# Patient Record
Sex: Male | Born: 1935 | Race: Black or African American | Hispanic: No | Smoking: Former smoker
Health system: Southern US, Community
[De-identification: ages and names within clinical notes are randomized; demographics above are authoritative.]

## PROBLEM LIST (undated history)

## (undated) DIAGNOSIS — R55 Syncope and collapse: Secondary | ICD-10-CM

## (undated) DIAGNOSIS — N400 Enlarged prostate without lower urinary tract symptoms: Secondary | ICD-10-CM

## (undated) DIAGNOSIS — M1990 Unspecified osteoarthritis, unspecified site: Secondary | ICD-10-CM

## (undated) DIAGNOSIS — M549 Dorsalgia, unspecified: Secondary | ICD-10-CM

## (undated) DIAGNOSIS — E119 Type 2 diabetes mellitus without complications: Secondary | ICD-10-CM

## (undated) DIAGNOSIS — G40909 Epilepsy, unspecified, not intractable, without status epilepticus: Secondary | ICD-10-CM

## (undated) DIAGNOSIS — K219 Gastro-esophageal reflux disease without esophagitis: Secondary | ICD-10-CM

## (undated) DIAGNOSIS — H409 Unspecified glaucoma: Secondary | ICD-10-CM

## (undated) DIAGNOSIS — F419 Anxiety disorder, unspecified: Secondary | ICD-10-CM

## (undated) DIAGNOSIS — I10 Essential (primary) hypertension: Secondary | ICD-10-CM

## (undated) DIAGNOSIS — E785 Hyperlipidemia, unspecified: Secondary | ICD-10-CM

## (undated) DIAGNOSIS — B029 Zoster without complications: Secondary | ICD-10-CM

## (undated) DIAGNOSIS — F609 Personality disorder, unspecified: Secondary | ICD-10-CM

## (undated) DIAGNOSIS — S2239XA Fracture of one rib, unspecified side, initial encounter for closed fracture: Secondary | ICD-10-CM

## (undated) DIAGNOSIS — H269 Unspecified cataract: Secondary | ICD-10-CM

## (undated) DIAGNOSIS — Z794 Long term (current) use of insulin: Principal | ICD-10-CM

## (undated) HISTORY — DX: Type 2 diabetes mellitus without complications: E11.9

## (undated) HISTORY — DX: Unspecified cataract: H26.9

## (undated) HISTORY — DX: Zoster without complications: B02.9

## (undated) HISTORY — DX: Essential (primary) hypertension: I10

## (undated) HISTORY — DX: Unspecified glaucoma: H40.9

## (undated) HISTORY — DX: Syncope and collapse: R55

## (undated) HISTORY — DX: Long term (current) use of insulin: Z79.4

## (undated) HISTORY — DX: Gastro-esophageal reflux disease without esophagitis: K21.9

## (undated) HISTORY — PX: EYE SURGERY: SHX253

---

## 2004-05-16 ENCOUNTER — Inpatient Hospital Stay (HOSPITAL_BASED_OUTPATIENT_CLINIC_OR_DEPARTMENT_OTHER): Admission: RE | Admit: 2004-05-16 | Discharge: 2004-05-16 | Payer: Self-pay | Admitting: Cardiovascular Disease

## 2010-12-30 HISTORY — PX: OTHER SURGICAL HISTORY: SHX169

## 2012-01-14 ENCOUNTER — Ambulatory Visit (INDEPENDENT_AMBULATORY_CARE_PROVIDER_SITE_OTHER): Payer: No Typology Code available for payment source | Admitting: Urology

## 2012-01-14 DIAGNOSIS — M545 Low back pain: Secondary | ICD-10-CM

## 2012-02-26 ENCOUNTER — Encounter (HOSPITAL_COMMUNITY): Payer: Self-pay

## 2012-02-26 ENCOUNTER — Observation Stay (HOSPITAL_COMMUNITY)
Admission: EM | Admit: 2012-02-26 | Discharge: 2012-02-28 | Disposition: A | Discharge status: Home / Self Care | Payer: No Typology Code available for payment source | Attending: Internal Medicine | Admitting: Internal Medicine

## 2012-02-26 ENCOUNTER — Emergency Department (HOSPITAL_COMMUNITY): Payer: No Typology Code available for payment source

## 2012-02-26 ENCOUNTER — Other Ambulatory Visit: Payer: Self-pay

## 2012-02-26 DIAGNOSIS — E119 Type 2 diabetes mellitus without complications: Secondary | ICD-10-CM

## 2012-02-26 DIAGNOSIS — I10 Essential (primary) hypertension: Secondary | ICD-10-CM | POA: Insufficient documentation

## 2012-02-26 DIAGNOSIS — R55 Syncope and collapse: Principal | ICD-10-CM | POA: Insufficient documentation

## 2012-02-26 DIAGNOSIS — R5381 Other malaise: Secondary | ICD-10-CM | POA: Insufficient documentation

## 2012-02-26 DIAGNOSIS — Z794 Long term (current) use of insulin: Secondary | ICD-10-CM | POA: Insufficient documentation

## 2012-02-26 DIAGNOSIS — E1159 Type 2 diabetes mellitus with other circulatory complications: Secondary | ICD-10-CM

## 2012-02-26 DIAGNOSIS — Z7982 Long term (current) use of aspirin: Secondary | ICD-10-CM | POA: Insufficient documentation

## 2012-02-26 DIAGNOSIS — G8929 Other chronic pain: Secondary | ICD-10-CM | POA: Insufficient documentation

## 2012-02-26 DIAGNOSIS — M549 Dorsalgia, unspecified: Secondary | ICD-10-CM | POA: Insufficient documentation

## 2012-02-26 DIAGNOSIS — Z79899 Other long term (current) drug therapy: Secondary | ICD-10-CM | POA: Insufficient documentation

## 2012-02-26 DIAGNOSIS — E785 Hyperlipidemia, unspecified: Secondary | ICD-10-CM | POA: Insufficient documentation

## 2012-02-26 DIAGNOSIS — F411 Generalized anxiety disorder: Secondary | ICD-10-CM | POA: Insufficient documentation

## 2012-02-26 DIAGNOSIS — I152 Hypertension secondary to endocrine disorders: Secondary | ICD-10-CM

## 2012-02-26 DIAGNOSIS — E1169 Type 2 diabetes mellitus with other specified complication: Secondary | ICD-10-CM

## 2012-02-26 HISTORY — DX: Hyperlipidemia, unspecified: E78.5

## 2012-02-26 HISTORY — DX: Personality disorder, unspecified: F60.9

## 2012-02-26 HISTORY — DX: Benign prostatic hyperplasia without lower urinary tract symptoms: N40.0

## 2012-02-26 HISTORY — DX: Type 2 diabetes mellitus without complications: Z79.4

## 2012-02-26 HISTORY — DX: Dorsalgia, unspecified: M54.9

## 2012-02-26 HISTORY — DX: Type 2 diabetes mellitus without complications: E11.9

## 2012-02-26 LAB — POCT I-STAT, CHEM 8
BUN: 9 mg/dL (ref 6–23)
Calcium, Ion: 1.16 mmol/L (ref 1.12–1.32)
Chloride: 100 mEq/L (ref 96–112)
Creatinine, Ser: 0.9 mg/dL (ref 0.50–1.35)
Glucose, Bld: 199 mg/dL — ABNORMAL HIGH (ref 70–99)
TCO2: 26 mmol/L (ref 0–100)

## 2012-02-26 LAB — URINALYSIS, ROUTINE W REFLEX MICROSCOPIC
Hgb urine dipstick: NEGATIVE
Leukocytes, UA: NEGATIVE
Nitrite: NEGATIVE
Protein, ur: NEGATIVE mg/dL
Specific Gravity, Urine: 1.018 (ref 1.005–1.030)
Urobilinogen, UA: 1 mg/dL (ref 0.0–1.0)

## 2012-02-26 LAB — GLUCOSE, CAPILLARY

## 2012-02-26 LAB — CARBOXYHEMOGLOBIN: Total hemoglobin: 14 g/dL (ref 13.5–18.0)

## 2012-02-26 MED ORDER — HYDROCHLOROTHIAZIDE 12.5 MG PO CAPS
12.5000 mg | ORAL_CAPSULE | Freq: Every day | ORAL | Status: DC
  Administered 2012-02-27 – 2012-02-28 (×2): 12.5 mg via ORAL
  Filled 2012-02-26 (×2): qty 1

## 2012-02-26 MED ORDER — INSULIN ASPART 100 UNIT/ML ~~LOC~~ SOLN
0.0000 [IU] | Freq: Three times a day (TID) | SUBCUTANEOUS | Status: DC
  Administered 2012-02-27 (×2): 5 [IU] via SUBCUTANEOUS
  Filled 2012-02-26: qty 0.15

## 2012-02-26 MED ORDER — SIMVASTATIN 20 MG PO TABS
20.0000 mg | ORAL_TABLET | Freq: Every day | ORAL | Status: DC
  Administered 2012-02-27: 20 mg via ORAL
  Filled 2012-02-26 (×2): qty 1

## 2012-02-26 MED ORDER — INSULIN ASPART 100 UNIT/ML ~~LOC~~ SOLN: 0.0000 [IU] | Freq: Every day | SUBCUTANEOUS | Status: DC

## 2012-02-26 MED ORDER — SODIUM CHLORIDE 0.9 % IJ SOLN: 3.0000 mL | INTRAMUSCULAR | Status: DC | PRN

## 2012-02-26 MED ORDER — INSULIN DETEMIR 100 UNIT/ML ~~LOC~~ SOLN
45.0000 [IU] | Freq: Every day | SUBCUTANEOUS | Status: DC
  Administered 2012-02-26 – 2012-02-27 (×2): 45 [IU] via SUBCUTANEOUS
  Filled 2012-02-26: qty 3

## 2012-02-26 MED ORDER — TERAZOSIN HCL 5 MG PO CAPS
5.0000 mg | ORAL_CAPSULE | Freq: Every day | ORAL | Status: DC
  Administered 2012-02-27 – 2012-02-28 (×2): 5 mg via ORAL
  Filled 2012-02-26 (×2): qty 1

## 2012-02-26 MED ORDER — CITALOPRAM HYDROBROMIDE 40 MG PO TABS
40.0000 mg | ORAL_TABLET | Freq: Every day | ORAL | Status: DC
  Administered 2012-02-27 – 2012-02-28 (×2): 40 mg via ORAL
  Filled 2012-02-26 (×2): qty 1

## 2012-02-26 MED ORDER — ENOXAPARIN SODIUM 40 MG/0.4ML ~~LOC~~ SOLN
40.0000 mg | SUBCUTANEOUS | Status: DC
  Administered 2012-02-27 – 2012-02-28 (×2): 40 mg via SUBCUTANEOUS
  Filled 2012-02-26 (×2): qty 0.4

## 2012-02-26 MED ORDER — RAMIPRIL 10 MG PO CAPS
10.0000 mg | ORAL_CAPSULE | Freq: Every day | ORAL | Status: DC
  Administered 2012-02-27 – 2012-02-28 (×2): 10 mg via ORAL
  Filled 2012-02-26 (×2): qty 1

## 2012-02-26 MED ORDER — NAPROXEN 500 MG PO TABS
500.0000 mg | ORAL_TABLET | Freq: Two times a day (BID) | ORAL | Status: DC
  Administered 2012-02-27 – 2012-02-28 (×2): 500 mg via ORAL
  Filled 2012-02-26 (×4): qty 1

## 2012-02-26 MED ORDER — AMLODIPINE BESYLATE 5 MG PO TABS
5.0000 mg | ORAL_TABLET | Freq: Every day | ORAL | Status: DC
  Administered 2012-02-27 – 2012-02-28 (×2): 5 mg via ORAL
  Filled 2012-02-26 (×2): qty 1

## 2012-02-26 MED ORDER — ASPIRIN EC 325 MG PO TBEC
325.0000 mg | DELAYED_RELEASE_TABLET | Freq: Every day | ORAL | Status: DC
  Administered 2012-02-27 – 2012-02-28 (×2): 325 mg via ORAL
  Filled 2012-02-26 (×2): qty 1

## 2012-02-26 MED ORDER — DICLOFENAC SODIUM 75 MG PO TBEC
75.0000 mg | DELAYED_RELEASE_TABLET | Freq: Two times a day (BID) | ORAL | Status: DC | PRN
  Filled 2012-02-26: qty 1

## 2012-02-26 MED ORDER — DIAZEPAM 5 MG PO TABS: 5.0000 mg | ORAL_TABLET | Freq: Two times a day (BID) | ORAL | Status: DC | PRN

## 2012-02-26 NOTE — ED Notes (Signed)
HYQ:[id number]<IO> Expected date:02/26/12<BR> Expected time: 5:33 PM<BR> Means of arrival:Ambulance<BR> Comments:<BR> EMS 6 RK - syncope
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
## 2012-02-27 DIAGNOSIS — R55 Syncope and collapse: Secondary | ICD-10-CM

## 2012-02-27 LAB — CBC
MCH: 30.2 pg (ref 26.0–34.0)
MCHC: 35.6 g/dL (ref 30.0–36.0)
Platelets: 228 10*3/uL (ref 150–400)
RBC: 4.37 MIL/uL (ref 4.22–5.81)

## 2012-02-27 LAB — LIPID PANEL
Cholesterol: 125 mg/dL (ref 0–200)
Total CHOL/HDL Ratio: 4.5 RATIO
Triglycerides: 166 mg/dL — ABNORMAL HIGH (ref <150)

## 2012-02-27 LAB — CARDIAC PANEL(CRET KIN+CKTOT+MB+TROPI)
CK, MB: 6.8 ng/mL (ref 0.3–4.0)
Troponin I: <0.3 ng/mL (ref <0.30)

## 2012-02-27 LAB — GLUCOSE, CAPILLARY
Glucose-Capillary: 216 mg/dL — ABNORMAL HIGH (ref 70–99)
Glucose-Capillary: 260 mg/dL — ABNORMAL HIGH (ref 70–99)

## 2012-02-27 LAB — BASIC METABOLIC PANEL
Calcium: 9.6 mg/dL (ref 8.4–10.5)
GFR calc Af Amer: >90 mL/min (ref >90)
GFR calc non Af Amer: 86 mL/min — ABNORMAL LOW (ref >90)
Glucose, Bld: 275 mg/dL — ABNORMAL HIGH (ref 70–99)
Sodium: 133 mEq/L — ABNORMAL LOW (ref 135–145)

## 2012-02-27 MED ORDER — INSULIN ASPART 100 UNIT/ML ~~LOC~~ SOLN
0.0000 [IU] | Freq: Three times a day (TID) | SUBCUTANEOUS | Status: DC
  Administered 2012-02-27 – 2012-02-28 (×2): 5 [IU] via SUBCUTANEOUS
  Administered 2012-02-28: 3 [IU] via SUBCUTANEOUS
  Filled 2012-02-27: qty 3

## 2012-02-27 NOTE — Progress Notes (Signed)
02/27/12 1715 Lab notified RN that the CK,MB was 6.8. MD was notified. Awaiting any new orders

## 2012-02-27 NOTE — ED Notes (Signed)
Not quite sure that patient understand the reason that he is being admitted.  Thisnis tha third time that hea has asked if he was going home today.  Told that they need to find out why he is passing out. And He sts  "oh yeah"

## 2012-02-27 NOTE — Progress Notes (Signed)
VASCULAR LAB PRELIMINARY  PRELIMINARY  PRELIMINARY  PRELIMINARY  Carotid duplex completed.    Preliminary report:  Bilateral:  No evidence of hemodynamically significant internal carotid artery stenosis.   Vertebral artery flow is antegrade.     Iren Whipp D, RVS 02/27/2012, 8:58 AM

## 2012-02-27 NOTE — ED Notes (Signed)
CBG 233 

## 2012-02-27 NOTE — Progress Notes (Signed)
Subjective: Patient feeling well. He denies chest pain, palpitation prior to syncope event. He has been taking flexeril since 3 months ago and valium for more than 8 months.  Objective: Filed Vitals:   02/26/12 2020 02/27/12 0103 02/27/12 0619 02/27/12 1401  BP: 166/77 156/73 154/73 146/80  Pulse: 83 79 81 87  Temp: 98.7 F (37.1 C) 98.4 F (36.9 C) 98.3 F (36.8 C) 97.5 F (36.4 C)  TempSrc: Oral Oral  Oral  Resp: 16 16 16    SpO2: 98% 95% 96% 96%   Weight change:  No intake or output data in the 24 hours ending 02/27/12 1433  General: Alert, awake, oriented x3, in no acute distress.  HEENT: No bruits, no goiter.  Heart: Regular rate and rhythm, without murmurs, rubs, gallops.  Lungs: CTA, bilateral air movement.  Abdomen: Soft, nontender, nondistended, positive bowel sounds.  Neuro: Grossly intact, nonfocal. Extremities; no edema.   Lab Results:  [facility] 02/27/12 0455 02/26/12 1942  NA 133* 138  K 3.8 3.7  CL 98 100  CO2 26 --  GLUCOSE 275* 199*  BUN 10 9  CREATININE 0.77 0.90  CALCIUM 9.6 --  MG -- --  PHOS -- --   Basename 02/27/12 0455 02/26/12 1942  WBC 6.7 --  NEUTROABS -- --  HGB 13.2 14.3  HCT 37.1* 42.0  MCV 84.9 --  PLT 228 --    Basename 02/27/12 0455  CHOL 125  HDL 28*  LDLCALC 64  TRIG 960*  CHOLHDL 4.5  LDLDIRECT --   Studies/Results: Dg Chest 2 View  02/26/2012  *RADIOLOGY REPORT*  Clinical Data: Weakness and syncope.  CHEST - 2 VIEW  Comparison: None.  Findings: Bibasilar atelectasis present, right greater than left. The heart is mildly enlarged.  No edema, pulmonary consolidation or pleural fluid identified.  The spine shows mild degenerative disease.  IMPRESSION: Bibasilar atelectasis, right greater than left.  Original Report Authenticated By: Reola Calkins, M.D.   Ct Head Wo Contrast  02/26/2012  *RADIOLOGY REPORT*  Clinical Data: 78 year old male with syncope.  CT HEAD WITHOUT CONTRAST  Technique:  Contiguous axial images were  obtained from the base of the skull through the vertex without contrast.  Comparison: None.  Findings: Visualized paranasal sinuses and mastoids are clear. Suggestion of left posterior convexity scalp hematoma.  Underlying calvarium intact.  No other acute scalp or orbit soft tissue findings.  Cerebral volume is within normal limits for age.  No midline shift, ventriculomegaly, mass effect, evidence of mass lesion, intracranial hemorrhage or evidence of cortically based acute infarction.  Gray-white matter differentiation is within normal limits throughout the brain.  No suspicious intracranial vascular hyperdensity.  IMPRESSION: Normal noncontrast CT appearance of the brain for age.  Left vertex hematoma without underlying fracture.  Original Report Authenticated By: Harley Hallmark, M.D.    Medications: I have reviewed the patient's current medications.   Patient Active Hospital Problem List:  Syncope and collapse (02/26/2012) Unclear etiology. Monitor for arrythmia on telemetry. I will check ECHO, cardiac enzymes.  CT head negative. Maybe component of polypharmacy. I advised patient to stop taking flexeril.   Diabetes mellitus (02/26/2012) Levemir, SSI.   Hypertension (02/26/2012) Continue with Norvasc, ramipril.   Hyperlipidemia (02/26/2012) Continue with simvastatin.    Chronic pain; Naproxen. Stop flexeril. Anxiety: Continue with valium PRN.    LOS: 1 day   Teagan Ozawa M.D.  [facility] 02/27/2012, 2:33 PM

## 2012-02-27 NOTE — Progress Notes (Signed)
  Echocardiogram 2D Echocardiogram has been performed.  Cathie Beams Deneen 02/27/2012, 4:18 PM

## 2012-02-28 DIAGNOSIS — S2249XA Multiple fractures of ribs, unspecified side, initial encounter for closed fracture: Secondary | ICD-10-CM

## 2012-02-28 HISTORY — DX: Multiple fractures of ribs, unspecified side, initial encounter for closed fracture: S22.49XA

## 2012-02-28 LAB — CARDIAC PANEL(CRET KIN+CKTOT+MB+TROPI): Total CK: 789 U/L — ABNORMAL HIGH (ref 7–232)

## 2012-02-28 LAB — GLUCOSE, CAPILLARY
Glucose-Capillary: 207 mg/dL — ABNORMAL HIGH (ref 70–99)
Glucose-Capillary: 281 mg/dL — ABNORMAL HIGH (ref 70–99)

## 2012-02-28 MED ORDER — CITALOPRAM HYDROBROMIDE 40 MG PO TABS: 20.0000 mg | ORAL_TABLET | Freq: Every day | ORAL | Status: DC
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
## 2012-02-28 NOTE — Progress Notes (Signed)
02/28/12 patient was given discharge education. PIV was removed. Patient is being transported to his home by a friend's car. Patient had his valuables and medications returned to him. Patient is eager to go home.
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
## 2012-02-28 NOTE — Progress Notes (Signed)
OT Note Order received, chart reviewed. Pt reported that he is independent with all aDLs and mobility. Pt presents with no OT needs at this time. Will sign off.  Garrel Ridgel, OTR/L  Pager [phone] 02/28/2012

## 2012-02-28 NOTE — Progress Notes (Signed)
Talked to patient about DCP; patient goes to a [facility] / Dr Christell Constant; lives alone, continues to be active in the community - very, very talkative. Prescriptions are filled at [facility] in [place]. Patient without any complaints at this time. Abelino Derrick RN, BSN, [employer]

## 2012-02-28 NOTE — Discharge Instructions (Addendum)
Blood Sugar Monitoring, Adult GLUCOSE METERS FOR SELF-MONITORING OF BLOOD GLUCOSE  It is important to be able to correctly measure your blood sugar (glucose). You can use a blood glucose monitor (a small battery-operated device) to check your glucose level at any time. This allows you and your caregiver to monitor your diabetes and to determine how well your treatment plan is working. The process of monitoring your blood glucose with a glucose meter is called self-monitoring of blood glucose (SMBG). When people with diabetes control their blood sugar, they have better health. To test for glucose with a typical glucose meter, place the disposable strip in the meter. Then place a small sample of blood on the "test strip." The test strip is coated with chemicals that combine with glucose in blood. The meter measures how much glucose is present. The meter displays the glucose level as a number. Several new models can record and store a number of test results. Some models can connect to personal computers to store test results or print them out.  Newer meters are often easier to use than older models. Some meters allow you to get blood from places other than your fingertip. Some new models have automatic timing, error codes, signals, or barcode readers to help with proper adjustment (calibration). Some meters have a large display screen or spoken instructions for people with visual impairments.  INSTRUCTIONS FOR USING GLUCOSE METERS  Wash your hands with soap and warm water, or clean the area with alcohol. Dry your hands completely.   Prick the side of your fingertip with a lancet (a sharp-pointed tool used by hand).   Hold the hand down and gently milk the finger until a small drop of blood appears. Catch the blood with the test strip.   Follow the instructions for inserting the test strip and using the SMBG meter. Most meters require the meter to be turned on and the test strip to be inserted before  applying the blood sample.   Record the test result.   Read the instructions carefully for both the meter and the test strips that go with it. Meter instructions are found in the user manual. Keep this manual to help you solve any problems that may arise. Many meters use "error codes" when there is a problem with the meter, the test strip, or the blood sample on the strip. You will need the manual to understand these error codes and fix the problem.   New devices are available such as laser lancets and meters that can test blood taken from "alternative sites" of the body, other than fingertips. However, you should use standard fingertip testing if your glucose changes rapidly. Also, use standard testing if:   You have eaten, exercised, or taken insulin in the past 2 hours.   You think your glucose is low.   You tend to not feel symptoms of low blood glucose (hypoglycemia).   You are ill or under stress.   Clean the meter as directed by the manufacturer.   Test the meter for accuracy as directed by the manufacturer.   Take your meter with you to your caregiver's office. This way, you can test your glucose in front of your caregiver to make sure you are using the meter correctly. Your caregiver can also take a sample of blood to test using a routine lab method. If values on the glucose meter are close to the lab results, you and your caregiver will see that your meter is working well  and you are using good technique. Your caregiver will advise you about what to do if the results do not match.  FREQUENCY OF TESTING  Your caregiver will tell you how often you should check your blood glucose. This will depend on your type of diabetes, your current level of diabetes control, and your types of medicines. The following are general guidelines, but your care plan may be different. Record all your readings and the time of day you took them for review with your caregiver.   Diabetes type 1.   When you  are using insulin with good diabetic control (either multiple daily injections or via a pump), you should check your glucose 4 times a day.   If your diabetes is not well controlled, you may need to monitor more frequently, including before meals and 2 hours after meals, at bedtime, and occasionally between 2 a.m. and 3 a.m.   You should always check your glucose before a dose of insulin or before changing the rate on your insulin pump.   Diabetes type 2.   Guidelines for SMBG in diabetes type 2 are not as well defined.   If you are on insulin, follow the guidelines above.   If you are on medicines, but not insulin, and your glucose is not well controlled, you should test at least twice daily.   If you are not on insulin, and your diabetes is controlled with medicines or diet alone, you should test at least once daily, usually before breakfast.   A weekly profile will help your caregiver advise you on your care plan. The week before your visit, check your glucose before a meal and 2 hours after a meal at least daily. You may want to test before and after a different meal each day so you and your caregiver can tell how well controlled your blood sugars are throughout the course of a 24 hour period.   Gestational diabetes (diabetes during pregnancy).   Frequent testing is often necessary. Accurate timing is important.   If you are not on insulin, check your glucose 4 times a day. Check it before breakfast and 1 hour after the start of each meal.   If you are on insulin, check your glucose 6 times a day. Check it before each meal and 1 hour after the first bite of each meal.   General guidelines.   More frequent testing is required at the start of insulin treatment. Your caregiver will instruct you.   Test your glucose any time you suspect you have low blood sugar (hypoglycemia).   You should test more often when you change medicines, when you have unusual stress or illness, or in other  unusual circumstances.  OTHER THINGS TO KNOW ABOUT GLUCOSE METERS  Measurement Range. Most glucose meters are able to read glucose levels over a broad range of values from as low as 0 to as high as 600 mg/dL. If you get an extremely high or low reading from your meter, you should first confirm it with another reading. Report very high or very low readings to your caregiver.   Whole Blood Glucose versus Plasma Glucose. Some older home glucose meters measure glucose in your whole blood. In a lab or when using some newer home glucose meters, the glucose is measured in your plasma (one component of blood). The difference can be important. It is important for you and your caregiver to know whether your meter gives its results as "whole blood equivalent" or "plasma  equivalent."   Display of High and Low Glucose Values. Part of learning how to operate a meter is understanding what the meter results mean. Know how high and low glucose concentrations are displayed on your meter.   Factors that Affect Glucose Meter Performance. The accuracy of your test results depends on many factors and varies depending on the brand and type of meter. These factors include:   Low red blood cell count (anemia).   Substances in your blood (such as uric acid, vitamin C, and others).   Environmental factors (temperature, humidity, altitude).   Name-brand versus generic test strips.   Calibration. Make sure your meter is set up properly. It is a good idea to do a calibration test with a control solution recommended by the manufacturer of your meter whenever you begin using a fresh bottle of test strips. This will help verify the accuracy of your meter.   Improperly stored, expired, or defective test strips. Keep your strips in a dry place with the lid on.   Soiled meter.   Inadequate blood sample.  NEW TECHNOLOGIES FOR GLUCOSE TESTING Alternative site testing Some glucose meters allow testing blood from alternative  sites. These include the:  Upper arm.   Forearm.   Base of the thumb.   Thigh.  Sampling blood from alternative sites may be desirable. However, it may have some limitations. Blood in the fingertips show changes in glucose levels more quickly than blood in other parts of the body. This means that alternative site test results may be different from fingertip test results, not because of the meter's ability to test accurately, but because the actual glucose concentration can be different.  Continuous Glucose Monitoring Devices to measure your blood glucose continuously are available, and others are in development. These methods can be more expensive than self-monitoring with a glucose meter. However, it is uncertain how effective and reliable these devices are. Your caregiver will advise you if this approach makes sense for you. IF BLOOD SUGARS ARE CONTROLLED, PEOPLE WITH DIABETES REMAIN HEALTHIER.  SMBG is an important part of the treatment plan of patients with diabetes mellitus. Below are reasons for using SMBG:   It confirms that your glucose is at a specific, healthy level.   It detects hypoglycemia and severe hyperglycemia.   It allows you and your caregiver to make adjustments in response to changes in lifestyle for individuals requiring medicine.   It determines the need for starting insulin therapy in temporary diabetes that happens during pregnancy (gestational diabetes).  Document Released: 12/19/2003 Document Revised: 08/29/2011 Document Reviewed: 04/11/2011 [facility] Patient Information 2012 [place], [place].Cholesterol Control Diet Cholesterol levels in your body are determined significantly by your diet. Cholesterol levels may also be related to heart disease. The following material helps to explain this relationship and discusses what you can do to help keep your heart healthy. Not all cholesterol is bad. Low-density lipoprotein (LDL) cholesterol is the "bad" cholesterol. It may  cause fatty deposits to build up inside your arteries. High-density lipoprotein (HDL) cholesterol is "good." It helps to remove the "bad" LDL cholesterol from your blood. Cholesterol is a very important risk factor for heart disease. Other risk factors are high blood pressure, smoking, stress, heredity, and weight. The heart muscle gets its supply of blood through the coronary arteries. If your LDL cholesterol is high and your HDL cholesterol is low, you are at risk for having fatty deposits build up in your coronary arteries. This leaves less room through which blood can  flow. Without sufficient blood and oxygen, the heart muscle cannot function properly and you may feel chest pains (angina pectoris). When a coronary artery closes up entirely, a part of the heart muscle may die, causing a heart attack (myocardial infarction). CHECKING CHOLESTEROL When your caregiver sends your blood to a lab to be analyzed for cholesterol, a complete lipid (fat) profile may be done. With this test, the total amount of cholesterol and levels of LDL and HDL are determined. Triglycerides are a type of fat that circulates in the blood and can also be used to determine heart disease risk. The list below describes what the numbers should be: Test: Total Cholesterol.  Less than 200 mg/dl.  Test: LDL "bad cholesterol."  Less than 100 mg/dl.   Less than 70 mg/dl if you are at very high risk of a heart attack or sudden cardiac death.  Test: HDL "good cholesterol."  Greater than 50 mg/dl for women.   Greater than 40 mg/dl for men.  Test: Triglycerides.  Less than 150 mg/dl.  CONTROLLING CHOLESTEROL WITH DIET Although exercise and lifestyle factors are important, your diet is key. That is because certain foods are known to raise cholesterol and others to lower it. The goal is to balance foods for their effect on cholesterol and more importantly, to replace saturated and trans fat with other types of fat, such as  monounsaturated fat, polyunsaturated fat, and omega-3 fatty acids. On average, a person should consume no more than 15 to 17 g of saturated fat daily. Saturated and trans fats are considered "bad" fats, and they will raise LDL cholesterol. Saturated fats are primarily found in animal products such as meats, butter, and cream. However, that does not mean you need to sacrifice all your favorite foods. Today, there are good tasting, low-fat, low-cholesterol substitutes for most of the things you like to eat. Choose low-fat or nonfat alternatives. Choose round or loin cuts of red meat, since these types of cuts are lowest in fat and cholesterol. Chicken (without the skin), fish, veal, and ground [place] breast are excellent choices. Eliminate fatty meats, such as hot dogs and salami. Even shellfish have little or no saturated fat. Have a 3 oz (85 g) portion when you eat lean meat, poultry, or fish. Trans fats are also called "partially hydrogenated oils." They are oils that have been scientifically manipulated so that they are solid at room temperature resulting in a longer shelf life and improved taste and texture of foods in which they are added. Trans fats are found in stick margarine, some tub margarines, cookies, crackers, and baked goods.  When baking and cooking, oils are an excellent substitute for butter. The monounsaturated oils are especially beneficial since it is believed they lower LDL and raise HDL. The oils you should avoid entirely are saturated tropical oils, such as coconut and palm.  Remember to eat liberally from food groups that are naturally free of saturated and trans fat, including fish, fruit, vegetables, beans, grains (barley, rice, couscous, bulgur wheat), and pasta (without cream sauces).  IDENTIFYING FOODS THAT LOWER CHOLESTEROL  Soluble fiber may lower your cholesterol. This type of fiber is found in fruits such as apples, vegetables such as broccoli, potatoes, and carrots, legumes  such as beans, peas, and lentils, and grains such as barley. Foods fortified with plant sterols (phytosterol) may also lower cholesterol. You should eat at least 2 g per day of these foods for a cholesterol lowering effect.  Read package labels to identify low-saturated  fats, trans fats free, and low-fat foods at the supermarket. Select cheeses that have only 2 to 3 g saturated fat per ounce. Use a heart-healthy tub margarine that is free of trans fats or partially hydrogenated oil. When buying baked goods (cookies, crackers), avoid partially hydrogenated oils. Breads and muffins should be made from whole grains (whole-wheat or whole oat flour, instead of "flour" or "enriched flour"). Buy non-creamy canned soups with reduced salt and no added fats.  FOOD PREPARATION TECHNIQUES  Never deep-fry. If you must fry, either stir-fry, which uses very little fat, or use non-stick cooking sprays. When possible, broil, bake, or roast meats, and steam vegetables. Instead of dressing vegetables with butter or margarine, use lemon and herbs, applesauce and cinnamon (for squash and sweet potatoes), nonfat yogurt, salsa, and low-fat dressings for salads.  LOW-SATURATED FAT / LOW-FAT FOOD SUBSTITUTES Meats / Saturated Fat (g)  Avoid: Steak, marbled (3 oz/85 g) / 11 g   Choose: Steak, lean (3 oz/85 g) / 4 g   Avoid: Hamburger (3 oz/85 g) / 7 g   Choose: Hamburger, lean (3 oz/85 g) / 5 g   Avoid: Ham (3 oz/85 g) / 6 g   Choose: Ham, lean cut (3 oz/85 g) / 2.4 g   Avoid: Chicken, with skin, dark meat (3 oz/85 g) / 4 g   Choose: Chicken, skin removed, dark meat (3 oz/85 g) / 2 g   Avoid: Chicken, with skin, light meat (3 oz/85 g) / 2.5 g   Choose: Chicken, skin removed, light meat (3 oz/85 g) / 1 g  Dairy / Saturated Fat (g)  Avoid: Whole milk (1 cup) / 5 g   Choose: Low-fat milk, 2% (1 cup) / 3 g   Choose: Low-fat milk, 1% (1 cup) / 1.5 g   Choose: Skim milk (1 cup) / 0.3 g   Avoid: Hard cheese (1  oz/28 g) / 6 g   Choose: Skim milk cheese (1 oz/28 g) / 2 to 3 g   Avoid: Cottage cheese, 4% fat (1 cup) / 6.5 g   Choose: Low-fat cottage cheese, 1% fat (1 cup) / 1.5 g   Avoid: Ice cream (1 cup) / 9 g   Choose: Sherbet (1 cup) / 2.5 g   Choose: Nonfat frozen yogurt (1 cup) / 0.3 g   Choose: Frozen fruit bar / trace   Avoid: Whipped cream (1 tbs) / 3.5 g   Choose: Nondairy whipped topping (1 tbs) / 1 g  Condiments / Saturated Fat (g)  Avoid: Mayonnaise (1 tbs) / 2 g   Choose: Low-fat mayonnaise (1 tbs) / 1 g   Avoid: Butter (1 tbs) / 7 g   Choose: Extra light margarine (1 tbs) / 1 g   Avoid: Coconut oil (1 tbs) / 11.8 g   Choose: Olive oil (1 tbs) / 1.8 g   Choose: Corn oil (1 tbs) / 1.7 g   Choose: Safflower oil (1 tbs) / 1.2 g   Choose: Sunflower oil (1 tbs) / 1.4 g   Choose: Soybean oil (1 tbs) / 2.4 g   Choose: Canola oil (1 tbs) / 1 g  Document Released: 12/16/2005 Document Revised: 08/28/2011 Document Reviewed: 06/06/2011 [facility] Patient Information 2012 [place], [place].Arterial Hypertension Arterial hypertension (high blood pressure) is a condition of elevated pressure in your blood vessels. Hypertension over a long period of time is a risk factor for strokes, heart attacks, and heart failure. It is also the leading cause of  kidney (renal) failure.  CAUSES   In Adults -- Over 90% of all hypertension has no known cause. This is called essential or primary hypertension. In the other 10% of people with hypertension, the increase in blood pressure is caused by another disorder. This is called secondary hypertension. Important causes of secondary hypertension are:   Heavy alcohol use.   Obstructive sleep apnea.   Hyperaldosterosim (Conn's syndrome).   Steroid use.   Chronic kidney failure.   Hyperparathyroidism.   Medications.   Renal artery stenosis.   Pheochromocytoma.   Cushing's disease.   Coarctation of the aorta.   Scleroderma renal  crisis.   Licorice (in excessive amounts).   Drugs (cocaine, methamphetamine).  Your caregiver can explain any items above that apply to you.  In Children -- Secondary hypertension is more common and should always be considered.   Pregnancy -- Few women of childbearing age have high blood pressure. However, up to 10% of them develop hypertension of pregnancy. Generally, this will not harm the woman. It may be a sign of 3 complications of pregnancy: preeclampsia, HELLP syndrome, and eclampsia. Follow up and control with medication is necessary.  SYMPTOMS   This condition normally does not produce any noticeable symptoms. It is usually found during a routine exam.   Malignant hypertension is a late problem of high blood pressure. It may have the following symptoms:   Headaches.   Blurred vision.   End-organ damage (this means your kidneys, heart, lungs, and other organs are being damaged).   Stressful situations can increase the blood pressure. If a person with normal blood pressure has their blood pressure go up while being seen by their caregiver, this is often termed "white coat hypertension." Its importance is not known. It may be related with eventually developing hypertension or complications of hypertension.   Hypertension is often confused with mental tension, stress, and anxiety.  DIAGNOSIS  The diagnosis is made by 3 separate blood pressure measurements. They are taken at least 1 week apart from each other. If there is organ damage from hypertension, the diagnosis may be made without repeat measurements. Hypertension is usually identified by having blood pressure readings:  Above 140/90 mmHg measured in both arms, at 3 separate times, over a couple weeks.   Over 130/80 mmHg should be considered a risk factor and may require treatment in patients with diabetes.  Blood pressure readings over 120/80 mmHg are called "pre-hypertension" even in non-diabetic patients. To get a true  blood pressure measurement, use the following guidelines. Be aware of the factors that can alter blood pressure readings.  Take measurements at least 1 hour after caffeine.   Take measurements 30 minutes after smoking and without any stress. This is another reason to quit smoking - it raises your blood pressure.   Use a proper cuff size. Ask your caregiver if you are not sure about your cuff size.   Most home blood pressure cuffs are automatic. They will measure systolic and diastolic pressures. The systolic pressure is the pressure reading at the start of sounds. Diastolic pressure is the pressure at which the sounds disappear. If you are elderly, measure pressures in multiple postures. Try sitting, lying or standing.   Sit at rest for a minimum of 5 minutes before taking measurements.   You should not be on any medications like decongestants. These are found in many cold medications.   Record your blood pressure readings and review them with your caregiver.  If you have hypertension:  Your caregiver may do tests to be sure you do not have secondary hypertension (see "causes" above).   Your caregiver may also look for signs of metabolic syndrome. This is also called Syndrome X or Insulin Resistance Syndrome. You may have this syndrome if you have type 2 diabetes, abdominal obesity, and abnormal blood lipids in addition to hypertension.   Your caregiver will take your medical and family history and perform a physical exam.   Diagnostic tests may include blood tests (for glucose, cholesterol, potassium, and kidney function), a urinalysis, or an EKG. Other tests may also be necessary depending on your condition.  PREVENTION  There are important lifestyle issues that you can adopt to reduce your chance of developing hypertension:  Maintain a normal weight.   Limit the amount of salt (sodium) in your diet.   Exercise often.   Limit alcohol intake.   Get enough potassium in your diet.  Discuss specific advice with your caregiver.   Follow a DASH diet (dietary approaches to stop hypertension). This diet is rich in fruits, vegetables, and low-fat dairy products, and avoids certain fats.  PROGNOSIS  Essential hypertension cannot be cured. Lifestyle changes and medical treatment can lower blood pressure and reduce complications. The prognosis of secondary hypertension depends on the underlying cause. Many people whose hypertension is controlled with medicine or lifestyle changes can live a normal, healthy life.  RISKS AND COMPLICATIONS  While high blood pressure alone is not an illness, it often requires treatment due to its short- and long-term effects on many organs. Hypertension increases your risk for:  CVAs or strokes (cerebrovascular accident).   Heart failure due to chronically high blood pressure (hypertensive cardiomyopathy).   Heart attack (myocardial infarction).   Damage to the retina (hypertensive retinopathy).   Kidney failure (hypertensive nephropathy).  Your caregiver can explain list items above that apply to you. Treatment of hypertension can significantly reduce the risk of complications. TREATMENT   For overweight patients, weight loss and regular exercise are recommended. Physical fitness lowers blood pressure.   Mild hypertension is usually treated with diet and exercise. A diet rich in fruits and vegetables, fat-free dairy products, and foods low in fat and salt (sodium) can help lower blood pressure. Decreasing salt intake decreases blood pressure in a 1/3 of people.   Stop smoking if you are a smoker.  The steps above are highly effective in reducing blood pressure. While these actions are easy to suggest, they are difficult to achieve. Most patients with moderate or severe hypertension end up requiring medications to bring their blood pressure down to a normal level. There are several classes of medications for treatment. Blood pressure pills  (antihypertensives) will lower blood pressure by their different actions. Lowering the blood pressure by 10 mmHg may decrease the risk of complications by as much as 25%. The goal of treatment is effective blood pressure control. This will reduce your risk for complications. Your caregiver will help you determine the best treatment for you according to your lifestyle. What is excellent treatment for one person, may not be for you. HOME CARE INSTRUCTIONS   Do not smoke.   Follow the lifestyle changes outlined in the "Prevention" section.   If you are on medications, follow the directions carefully. Blood pressure medications must be taken as prescribed. Skipping doses reduces their benefit. It also puts you at risk for problems.   Follow up with your caregiver, as directed.   If you are asked to monitor your blood pressure  at home, follow the guidelines in the "Diagnosis" section above.  SEEK MEDICAL CARE IF:   You think you are having medication side effects.   You have recurrent headaches or lightheadedness.   You have swelling in your ankles.   You have trouble with your vision.  SEEK IMMEDIATE MEDICAL CARE IF:   You have sudden onset of chest pain or pressure, difficulty breathing, or other symptoms of a heart attack.   You have a severe headache.   You have symptoms of a stroke (such as sudden weakness, difficulty speaking, difficulty walking).  MAKE SURE YOU:   Understand these instructions.   Will watch your condition.   Will get help right away if you are not doing well or get worse.  Document Released: 12/16/2005 Document Revised: 08/28/2011 Document Reviewed: 07/16/2007 [facility] Patient Information 2012 [place], [place].

## 2012-03-06 ENCOUNTER — Ambulatory Visit: Payer: No Typology Code available for payment source | Admitting: Cardiology

## 2012-03-06 ENCOUNTER — Encounter: Payer: Self-pay | Admitting: Cardiology

## 2012-03-09 ENCOUNTER — Ambulatory Visit (INDEPENDENT_AMBULATORY_CARE_PROVIDER_SITE_OTHER): Payer: No Typology Code available for payment source | Admitting: Cardiology

## 2012-03-09 ENCOUNTER — Encounter: Payer: Self-pay | Admitting: Cardiology

## 2012-03-09 VITALS — BP 142/73 | HR 74 | Resp 18 | Ht 71.0 in | Wt 237.0 lb

## 2012-03-09 DIAGNOSIS — E785 Hyperlipidemia, unspecified: Secondary | ICD-10-CM

## 2012-03-09 DIAGNOSIS — R55 Syncope and collapse: Secondary | ICD-10-CM

## 2012-03-09 DIAGNOSIS — E119 Type 2 diabetes mellitus without complications: Secondary | ICD-10-CM

## 2012-03-09 DIAGNOSIS — I10 Essential (primary) hypertension: Secondary | ICD-10-CM

## 2012-03-09 MED ORDER — CITALOPRAM HYDROBROMIDE 10 MG PO TABS: 10.0000 mg | ORAL_TABLET | Freq: Every day | ORAL | Status: DC

## 2012-03-09 NOTE — Assessment & Plan Note (Signed)
Etiology not clear at this point. He has been told not to drive and this was reinforced today (technically for 6 months after syncopal event). LVEF normal by recent echocardiogram, no definite stroke. Not orthostatic by description. He does have a borderline to mildly prolonged QTc by ECG. I have asked him to reduce Celexa to 10 mg daily and will arrange an event recorder with office followup. No other medication changes for now.

## 2012-03-09 NOTE — Assessment & Plan Note (Signed)
Followed by Dr. Moore. 
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
## 2012-03-09 NOTE — Assessment & Plan Note (Signed)
Blood pressure mildly increased today. No change to antihypertensives.

## 2012-03-09 NOTE — Patient Instructions (Addendum)
**Note De-identified  Obfuscation**  **Note De-Identified  Obfuscation** Your physician has recommended you make the following change in your medication: Decrease Celexa to 10 mg daily   Your physician has recommended that you wear an event monitor. Event monitors are medical devices that record the heart's electrical activity. Doctors most often [place] these monitors to diagnose arrhythmias. Arrhythmias are problems with the speed or rhythm of the heartbeat. The monitor is a small, portable device. You can wear one while you do your normal daily activities. This is usually used to diagnose what is causing palpitations/syncope (passing out).  Your physician recommends that you schedule a follow-up appointment in: 3 weeks

## 2012-03-09 NOTE — Assessment & Plan Note (Signed)
On statin, followed by Dr. Christell Constant. Goal LDL under 100 with diabetes.

## 2012-03-22 ENCOUNTER — Telehealth: Payer: Self-pay | Admitting: Physician Assistant

## 2012-03-22 NOTE — Telephone Encounter (Signed)
Patient did call me back. He notes several episodes of syncope since seeing Dr. Nona Dell. I called [employer] back.  He is wearing a device that will trigger for VT, AFib, or bradyarrhythmias. No significant arrhythmias have been identified. Patient does describe significant cough assoc with syncope.  ? If cough induced. Reassured patient. Asked him to go to the ED if he had CP or dyspnea assoc with syncope. Tereso Newcomer, PA-C  4:14 PM 03/22/2012

## 2012-03-22 NOTE — Telephone Encounter (Signed)
Patient wearing monitor for syncope. Event recorded earlier today with reported symptoms of chest pain, lightheadedness, fatigue, dyspnea, faint, fell. Tele demonstrated NSR with HR 75 by report. [employer] tried to contact 3 times and left message.  They then contacted me.  They have been unable to reach him. I have asked them to notify the patient to call the answering service should he call back tonight (if he truly had a syncopal episode). Tereso Newcomer, PA-C  3:47 PM 03/22/2012

## 2012-03-23 ENCOUNTER — Emergency Department (HOSPITAL_COMMUNITY): Payer: No Typology Code available for payment source

## 2012-03-23 ENCOUNTER — Encounter (HOSPITAL_COMMUNITY): Payer: Self-pay | Admitting: *Deleted

## 2012-03-23 ENCOUNTER — Observation Stay (HOSPITAL_COMMUNITY)
Admission: EM | Admit: 2012-03-23 | Discharge: 2012-03-26 | Disposition: A | Discharge status: Home / Self Care | Payer: No Typology Code available for payment source | Attending: Internal Medicine | Admitting: Internal Medicine

## 2012-03-23 ENCOUNTER — Other Ambulatory Visit: Payer: Self-pay

## 2012-03-23 DIAGNOSIS — R7989 Other specified abnormal findings of blood chemistry: Secondary | ICD-10-CM | POA: Insufficient documentation

## 2012-03-23 DIAGNOSIS — G40909 Epilepsy, unspecified, not intractable, without status epilepticus: Secondary | ICD-10-CM | POA: Diagnosis present

## 2012-03-23 DIAGNOSIS — E1169 Type 2 diabetes mellitus with other specified complication: Secondary | ICD-10-CM | POA: Diagnosis present

## 2012-03-23 DIAGNOSIS — H401190 Primary open-angle glaucoma, unspecified eye, stage unspecified: Secondary | ICD-10-CM | POA: Diagnosis present

## 2012-03-23 DIAGNOSIS — S2249XA Multiple fractures of ribs, unspecified side, initial encounter for closed fracture: Secondary | ICD-10-CM | POA: Insufficient documentation

## 2012-03-23 DIAGNOSIS — S2239XA Fracture of one rib, unspecified side, initial encounter for closed fracture: Secondary | ICD-10-CM

## 2012-03-23 DIAGNOSIS — Z79899 Other long term (current) drug therapy: Secondary | ICD-10-CM | POA: Insufficient documentation

## 2012-03-23 DIAGNOSIS — R55 Syncope and collapse: Principal | ICD-10-CM | POA: Diagnosis present

## 2012-03-23 DIAGNOSIS — E785 Hyperlipidemia, unspecified: Secondary | ICD-10-CM | POA: Diagnosis present

## 2012-03-23 DIAGNOSIS — F419 Anxiety disorder, unspecified: Secondary | ICD-10-CM | POA: Diagnosis present

## 2012-03-23 DIAGNOSIS — R0602 Shortness of breath: Secondary | ICD-10-CM | POA: Insufficient documentation

## 2012-03-23 DIAGNOSIS — E119 Type 2 diabetes mellitus without complications: Secondary | ICD-10-CM | POA: Diagnosis present

## 2012-03-23 DIAGNOSIS — M899 Disorder of bone, unspecified: Secondary | ICD-10-CM

## 2012-03-23 DIAGNOSIS — E1159 Type 2 diabetes mellitus with other circulatory complications: Secondary | ICD-10-CM | POA: Diagnosis present

## 2012-03-23 DIAGNOSIS — G40802 Other epilepsy, not intractable, without status epilepticus: Secondary | ICD-10-CM | POA: Insufficient documentation

## 2012-03-23 DIAGNOSIS — Z794 Long term (current) use of insulin: Secondary | ICD-10-CM | POA: Insufficient documentation

## 2012-03-23 DIAGNOSIS — X58XXXA Exposure to other specified factors, initial encounter: Secondary | ICD-10-CM | POA: Insufficient documentation

## 2012-03-23 DIAGNOSIS — Z23 Encounter for immunization: Secondary | ICD-10-CM | POA: Insufficient documentation

## 2012-03-23 DIAGNOSIS — I10 Essential (primary) hypertension: Secondary | ICD-10-CM | POA: Insufficient documentation

## 2012-03-23 HISTORY — DX: Anxiety disorder, unspecified: F41.9

## 2012-03-23 HISTORY — DX: Epilepsy, unspecified, not intractable, without status epilepticus: G40.909

## 2012-03-23 HISTORY — DX: Fracture of one rib, unspecified side, initial encounter for closed fracture: S22.39XA

## 2012-03-23 LAB — DIFFERENTIAL
Eosinophils Absolute: 0.9 10*3/uL — ABNORMAL HIGH (ref 0.0–0.7)
Lymphocytes Relative: 24 % (ref 12–46)
Lymphs Abs: 2.2 10*3/uL (ref 0.7–4.0)
Neutro Abs: 5.3 10*3/uL (ref 1.7–7.7)
Neutrophils Relative %: 58 % (ref 43–77)

## 2012-03-23 LAB — GLUCOSE, CAPILLARY
Glucose-Capillary: 296 mg/dL — ABNORMAL HIGH (ref 70–99)
Glucose-Capillary: 298 mg/dL — ABNORMAL HIGH (ref 70–99)

## 2012-03-23 LAB — BASIC METABOLIC PANEL
BUN: 15 mg/dL (ref 6–23)
CO2: 25 mEq/L (ref 19–32)
Calcium: 9.6 mg/dL (ref 8.4–10.5)
Chloride: 94 mEq/L — ABNORMAL LOW (ref 96–112)
Creatinine, Ser: 0.73 mg/dL (ref 0.50–1.35)
GFR calc Af Amer: >90 mL/min (ref >90)
Potassium: 3.8 mEq/L (ref 3.5–5.1)
Sodium: 132 mEq/L — ABNORMAL LOW (ref 135–145)

## 2012-03-23 LAB — CBC
Platelets: 270 10*3/uL (ref 150–400)
RBC: 4.51 MIL/uL (ref 4.22–5.81)
WBC: 9.1 10*3/uL (ref 4.0–10.5)

## 2012-03-23 LAB — TROPONIN I: Troponin I: <0.3 ng/mL (ref <0.30)

## 2012-03-23 MED ORDER — LATANOPROST 0.005 % OP SOLN
1.0000 [drp] | Freq: Every day | OPHTHALMIC | Status: DC
  Administered 2012-03-24 – 2012-03-25 (×2): 1 [drp] via OPHTHALMIC
  Filled 2012-03-23: qty 2.5

## 2012-03-23 MED ORDER — PNEUMOCOCCAL VAC POLYVALENT 25 MCG/0.5ML IJ INJ
0.5000 mL | INJECTION | INTRAMUSCULAR | Status: AC
  Administered 2012-03-24: 0.5 mL via INTRAMUSCULAR
  Filled 2012-03-23: qty 0.5

## 2012-03-23 MED ORDER — POLYVINYL ALCOHOL 1.4 % OP SOLN: 2.0000 [drp] | Freq: Every day | OPHTHALMIC | Status: DC | PRN

## 2012-03-23 MED ORDER — RAMIPRIL 10 MG PO CAPS
10.0000 mg | ORAL_CAPSULE | Freq: Every day | ORAL | Status: DC
  Administered 2012-03-24 – 2012-03-26 (×3): 10 mg via ORAL
  Filled 2012-03-23 (×3): qty 1

## 2012-03-23 MED ORDER — INSULIN ASPART 100 UNIT/ML ~~LOC~~ SOLN: 20.0000 [IU] | SUBCUTANEOUS | Status: DC

## 2012-03-23 MED ORDER — INSULIN ASPART 100 UNIT/ML ~~LOC~~ SOLN
20.0000 [IU] | Freq: Every day | SUBCUTANEOUS | Status: DC
  Administered 2012-03-24 – 2012-03-26 (×3): 20 [IU] via SUBCUTANEOUS

## 2012-03-23 MED ORDER — SIMVASTATIN 20 MG PO TABS: 20.0000 mg | ORAL_TABLET | Freq: Every day | ORAL | Status: DC

## 2012-03-23 MED ORDER — SODIUM CHLORIDE 0.9 % IJ SOLN
3.0000 mL | Freq: Two times a day (BID) | INTRAMUSCULAR | Status: DC
  Administered 2012-03-23 – 2012-03-26 (×5): 3 mL via INTRAVENOUS
  Filled 2012-03-23 (×4): qty 3

## 2012-03-23 MED ORDER — TERAZOSIN HCL 5 MG PO CAPS
5.0000 mg | ORAL_CAPSULE | Freq: Every day | ORAL | Status: DC
  Filled 2012-03-23: qty 1

## 2012-03-23 MED ORDER — HEPARIN SODIUM (PORCINE) 5000 UNIT/ML IJ SOLN
5000.0000 [IU] | Freq: Three times a day (TID) | INTRAMUSCULAR | Status: DC
  Administered 2012-03-23 – 2012-03-26 (×8): 5000 [IU] via SUBCUTANEOUS
  Filled 2012-03-23 (×9): qty 1

## 2012-03-23 MED ORDER — INSULIN DETEMIR 100 UNIT/ML ~~LOC~~ SOLN
50.0000 [IU] | Freq: Every day | SUBCUTANEOUS | Status: DC
  Administered 2012-03-23 – 2012-03-24 (×2): 50 [IU] via SUBCUTANEOUS
  Filled 2012-03-23: qty 3

## 2012-03-23 MED ORDER — CITALOPRAM HYDROBROMIDE 20 MG PO TABS
10.0000 mg | ORAL_TABLET | Freq: Every day | ORAL | Status: DC
  Administered 2012-03-23 – 2012-03-26 (×4): 10 mg via ORAL
  Filled 2012-03-23 (×4): qty 1

## 2012-03-23 MED ORDER — HYDROCHLOROTHIAZIDE 12.5 MG PO CAPS
12.5000 mg | ORAL_CAPSULE | Freq: Every day | ORAL | Status: DC
  Administered 2012-03-24 – 2012-03-26 (×3): 12.5 mg via ORAL
  Filled 2012-03-23 (×3): qty 1

## 2012-03-23 MED ORDER — ACETAMINOPHEN 650 MG RE SUPP: 650.0000 mg | Freq: Four times a day (QID) | RECTAL | Status: DC | PRN

## 2012-03-23 MED ORDER — BUDESONIDE-FORMOTEROL FUMARATE 160-4.5 MCG/ACT IN AERO
INHALATION_SPRAY | RESPIRATORY_TRACT | Status: AC
  Filled 2012-03-23: qty 6

## 2012-03-23 MED ORDER — DIAZEPAM 2 MG PO TABS
2.0000 mg | ORAL_TABLET | Freq: Two times a day (BID) | ORAL | Status: DC | PRN
  Filled 2012-03-23: qty 1

## 2012-03-23 MED ORDER — BENZONATATE 100 MG PO CAPS
200.0000 mg | ORAL_CAPSULE | Freq: Three times a day (TID) | ORAL | Status: DC | PRN
  Administered 2012-03-25: 200 mg via ORAL
  Filled 2012-03-23: qty 2

## 2012-03-23 MED ORDER — ATORVASTATIN CALCIUM 20 MG PO TABS: 20.0000 mg | ORAL_TABLET | Freq: Every day | ORAL | Status: DC

## 2012-03-23 MED ORDER — VITAMIN D 1000 UNITS PO TABS
1000.0000 [IU] | ORAL_TABLET | Freq: Every day | ORAL | Status: DC
Start: 2012-03-23 — End: 2012-03-26
  Administered 2012-03-24 – 2012-03-26 (×3): 1000 [IU] via ORAL
  Filled 2012-03-23 (×3): qty 1

## 2012-03-23 MED ORDER — AMLODIPINE BESYLATE 5 MG PO TABS
5.0000 mg | ORAL_TABLET | Freq: Every day | ORAL | Status: DC
  Administered 2012-03-24 – 2012-03-26 (×3): 5 mg via ORAL
  Filled 2012-03-23 (×3): qty 1

## 2012-03-23 MED ORDER — ASPIRIN EC 325 MG PO TBEC
325.0000 mg | DELAYED_RELEASE_TABLET | Freq: Every day | ORAL | Status: DC
  Administered 2012-03-24 – 2012-03-26 (×3): 325 mg via ORAL
  Filled 2012-03-23 (×3): qty 1

## 2012-03-23 MED ORDER — MAGNESIUM HYDROXIDE 400 MG/5ML PO SUSP
30.0000 mL | Freq: Every day | ORAL | Status: DC | PRN
  Administered 2012-03-25: 30 mL via ORAL
  Filled 2012-03-23: qty 30

## 2012-03-23 MED ORDER — TIMOLOL MALEATE 0.5 % OP SOLN
1.0000 [drp] | Freq: Every morning | OPHTHALMIC | Status: DC
  Administered 2012-03-24 – 2012-03-26 (×3): 1 [drp] via OPHTHALMIC
  Filled 2012-03-23: qty 5

## 2012-03-23 MED ORDER — BUDESONIDE-FORMOTEROL FUMARATE 160-4.5 MCG/ACT IN AERO
2.0000 | INHALATION_SPRAY | Freq: Two times a day (BID) | RESPIRATORY_TRACT | Status: DC
  Administered 2012-03-23 – 2012-03-26 (×6): 2 via RESPIRATORY_TRACT
  Filled 2012-03-23: qty 6

## 2012-03-23 MED ORDER — ACETAMINOPHEN 325 MG PO TABS: 650.0000 mg | ORAL_TABLET | Freq: Four times a day (QID) | ORAL | Status: DC | PRN

## 2012-03-23 MED ORDER — NAPROXEN 250 MG PO TABS
500.0000 mg | ORAL_TABLET | Freq: Two times a day (BID) | ORAL | Status: DC
  Administered 2012-03-23 – 2012-03-26 (×6): 500 mg via ORAL
  Filled 2012-03-23 (×6): qty 2

## 2012-03-23 MED ORDER — IOHEXOL 350 MG/ML SOLN
100.0000 mL | Freq: Once | INTRAVENOUS | Status: AC | PRN
  Administered 2012-03-23: 100 mL via INTRAVENOUS

## 2012-03-23 MED ORDER — PROPYLENE GLYCOL 0.6 % OP SOLN
2.0000 [drp] | Freq: Every day | OPHTHALMIC | Status: DC | PRN
  Filled 2012-03-23: qty 1

## 2012-03-23 NOTE — ED Notes (Signed)
Pt presents to er with c/o multiple episodes of "blacking out" pt wearing a holter monitor that was placed two weeks ago by Dr. Dietrich Pates office. holter monitor placed due to pt having syncopal episodes. Pt alert orineted. Denies any pain, n/v, sob. Pt states that he has had at least 10 episodes over the weekend. Did fall this am, denies any injury from fall.

## 2012-03-23 NOTE — ED Provider Notes (Signed)
History    This chart was scribed for [name], DO, MD by Smitty Pluck. The patient was seen in room APA18 and the patient's care was started at 12:08PM.   CSN: [id number]  Arrival date & time 03/23/12  1016   First MD Initiated Contact with Patient 03/23/12 1128      Chief Complaint  Patient presents with  . Loss of Consciousness    The history is provided by the patient.   [name] is a 78 y.o. male who presents to the Emergency Department complaining of sudden onset and resolution of multiple intermittent daily episodes of syncope for the past several weeks. Pt states he has had "at least 10 episodes" of syncope over the past weekend. Pt reports coughing sometimes before he has LOC, but that he does not cough everytime that he has had LOC.  He reports that he was standing at home microwaving his food when he had his last episode of syncope this morning. He states that the syncope can occur in any body position and not while changing body positions. Pt has a Holter monitor on that was placed two weeks ago by his Cards MD.  Pt states he called his Cards office PTA and was told that the monitor did not show any abnormality during any of his syncopal episodes.  Denies CP/SOB, no palpitations, no prodromal symptoms before syncope, no fevers, no back pain, no abd pain, no incont of bowel/bladder, no visual changes, no focal motor weakness, no tingling/numbness in extremities, no confusion.     Cards:  Dr. Diona Browner  PCP: Dr. Christell Constant in [place]   Past Medical History  Diagnosis Date  . Asthma   . Essential hypertension, benign   . Personality disorder   . Type 2 diabetes mellitus   . Hyperlipidemia   . BPH (benign prostatic hypertrophy)   . Back pain     Past Surgical History  Procedure Date  . Right eye surgery 2012    Prior childhood injury    Family History  Problem Relation Age of Onset  . Diabetes Father   . Diabetes Sister   . Diabetes Brother     History    Substance Use Topics  . Smoking status: Former Smoker    Types: Cigarettes    Quit date: 02/26/2004  . Smokeless tobacco: Former Neurosurgeon    Quit date: 02/25/1951  . Alcohol Use: No    Review of Systems ROS: Statement: All systems negative except as marked or noted in the HPI; Constitutional: Negative for fever and chills. ; ; Eyes: Negative for eye pain, redness and discharge. ; ; ENMT: Negative for ear pain, hoarseness, nasal congestion, sinus pressure and sore throat. ; ; Cardiovascular: Negative for chest pain, palpitations, diaphoresis, dyspnea and peripheral edema. ; ; Respiratory: Negative for cough, wheezing and stridor. ; ; Gastrointestinal: Negative for nausea, vomiting, diarrhea, abdominal pain, blood in stool, hematemesis, jaundice and rectal bleeding. . ; ; Genitourinary: Negative for dysuria, flank pain and hematuria. ; ; Musculoskeletal: Negative for back pain and neck pain. Negative for swelling and trauma.; ; Skin: Negative for pruritus, rash, abrasions, blisters, bruising and skin lesion.; ; Neuro: Negative for headache, lightheadedness and neck stiffness. Negative for weakness, altered level of consciousness , altered mental status, extremity weakness, paresthesias, involuntary movement, seizure and +syncope.     Allergies  Mucinex  Home Medications   Current Outpatient Rx  Name Route Sig Dispense Refill  . AMLODIPINE BESYLATE 5 MG PO TABS  Oral Take 5 mg by mouth daily.    . ASPIRIN EC 325 MG PO TBEC Oral Take 325 mg by mouth daily.    . ATORVASTATIN CALCIUM 20 MG PO TABS Oral Take 20 mg by mouth daily.    [name] BIMATOPROST 0.03 % OP SOLN  1 drop at bedtime.    [name] VITAMIN D 1000 UNITS PO TABS Oral Take 2,000 Units by mouth daily.    [name] CITALOPRAM HYDROBROMIDE 10 MG PO TABS Oral Take 1 tablet (10 mg total) by mouth daily. 30 tablet 3    Dose decrease  . DIAZEPAM 5 MG PO TABS Oral Take 5 mg by mouth 2 (two) times daily as needed. Nerves    . HYDROCHLOROTHIAZIDE 12.5 MG PO CAPS  Oral Take 12.5 mg by mouth daily.    . INSULIN ASPART 100 UNIT/ML [place] SOLN Subcutaneous Inject 20-45 Units into the skin See admin instructions. CHECKS BLOOD SUGAR 3 TIMES A DAY PER SLIDING SCALE    . INSULIN DETEMIR 100 UNIT/ML [place] SOLN Subcutaneous Inject 45 Units into the skin at bedtime.    [name] NAPROXEN 500 MG PO TABS Oral Take 500 mg by mouth 2 (two) times daily with a meal.    . RAMIPRIL 10 MG PO CAPS Oral Take 10 mg by mouth daily.    [name] TERAZOSIN HCL 5 MG PO CAPS Oral Take 5 mg by mouth daily.    . TRAVOPROST (BAK FREE) 0.004 % OP SOLN  1 drop at bedtime.      BP 137/75  Pulse 88  Temp 98.4 F (36.9 C)  Resp 18  Ht 5\' 11"  (1.803 m)  Wt 237 lb (107.502 kg)  BMI 33.05 kg/m2  Physical Exam 1215: Physical examination:  Nursing notes reviewed; Vital signs and O2 SAT reviewed;  Constitutional: Well developed, Well nourished, In no acute distress; Head:  Normocephalic, atraumatic; Eyes: EOMI, PERRL, No scleral icterus; ENMT: Mouth and pharynx normal, Mucous membranes dry; Neck: Supple, Full range of motion, No lymphadenopathy; Cardiovascular: Regular rate and rhythm, No murmur or gallop; Respiratory: Breath sounds clear & equal bilaterally, No rales, rhonchi, wheezes, Normal respiratory effort/excursion; Chest: Nontender, Movement normal; Abdomen: Soft, Nontender, Nondistended, Normal bowel sounds; Extremities: Pulses normal, No tenderness, No edema, No calf edema or asymmetry.; Neuro: AA&Ox3, Major CN grossly intact.  Strength 5/5 equal bilat UE's and LE's.  DTR 2/4 equal bilat UE's and LE's.  No gross sensory deficits.  Normal cerebellar testing bilat UE's and LE's.  No pronator drift.  Speech clear.  No facial droop.; Skin: Color normal, Warm, Dry, no rash.    ED Course  Procedures   MDM  MDM Reviewed: previous chart, nursing note and vitals Reviewed previous: ECG Interpretation: ECG, labs, x-ray and CT scan      Date: 03/23/2012  Rate: 89  Rhythm: normal sinus rhythm  QRS Axis:  normal  Intervals: normal  ST/T Wave abnormalities: normal  Conduction Disutrbances:none  Narrative Interpretation:   Old EKG Reviewed: unchanged; no significant changes from previous EKG dated 02/26/2012.  Results for orders placed during the hospital encounter of 03/23/12  CBC      Component Value Range   WBC 9.1  4.0 - 10.5 (K/uL)   RBC 4.51  4.22 - 5.81 (MIL/uL)   Hemoglobin 13.2  13.0 - 17.0 (g/dL)   HCT 16.1 (*) 09.6 - 52.0 (%)   MCV 85.1  78.0 - 100.0 (fL)   MCH 29.3  26.0 - 34.0 (pg)   MCHC 34.4  30.0 - 36.0 (g/dL)   RDW 62.9  52.8 - 41.3 (%)   Platelets 270  150 - 400 (K/uL)  DIFFERENTIAL      Component Value Range   Neutrophils Relative 58  43 - 77 (%)   Neutro Abs 5.3  1.7 - 7.7 (K/uL)   Lymphocytes Relative 24  12 - 46 (%)   Lymphs Abs 2.2  0.7 - 4.0 (K/uL)   Monocytes Relative 7  3 - 12 (%)   Monocytes Absolute 0.7  0.1 - 1.0 (K/uL)   Eosinophils Relative 10 (*) 0 - 5 (%)   Eosinophils Absolute 0.9 (*) 0.0 - 0.7 (K/uL)   Basophils Relative 0  0 - 1 (%)   Basophils Absolute 0.0  0.0 - 0.1 (K/uL)  BASIC METABOLIC PANEL      Component Value Range   Sodium 132 (*) 135 - 145 (mEq/L)   Potassium 3.8  3.5 - 5.1 (mEq/L)   Chloride 94 (*) 96 - 112 (mEq/L)   CO2 25  19 - 32 (mEq/L)   Glucose, Bld 244 (*) 70 - 99 (mg/dL)   BUN 15  6 - 23 (mg/dL)   Creatinine, Ser 2.44  0.50 - 1.35 (mg/dL)   Calcium 9.6  8.4 - 01.0 (mg/dL)   GFR calc non Af Amer 88 (*) >90 (mL/min)   GFR calc Af Amer >90  >90 (mL/min)  TROPONIN I      Component Value Range   Troponin I <0.30  <0.30 (ng/mL)  D-DIMER, QUANTITATIVE      Component Value Range   D-Dimer, Quant 0.50 (*) 0.00 - 0.48 (ug/mL-FEU)   Ct Head Wo Contrast 03/23/2012  *RADIOLOGY REPORT*  Clinical Data: Syncopal episode.  CT HEAD WITHOUT CONTRAST  Technique:  Contiguous axial images were obtained from the base of the skull through the vertex without contrast.  Comparison: 02/26/2012.  Findings: The ventricles are normal.  No  extra-axial fluid collections are seen.  The brainstem and cerebellum are unremarkable.  No acute intracranial findings such as infarction or hemorrhage.  No mass lesions.  The bony calvarium is intact.  The visualized paranasal sinuses and mastoid air cells are clear.  IMPRESSION: No acute intracranial findings or mass lesion.  No change since recent prior study  Original Report Authenticated By: P. Loralie Champagne, M.D.   Ct Angio Chest W/cm &/or Wo Cm 03/23/2012  *RADIOLOGY REPORT*  Clinical Data: Shortness of breath.  Elevated D-dimer.  CT ANGIOGRAPHY CHEST  Technique:  Multidetector CT imaging of the chest using the standard protocol during bolus administration of intravenous contrast. Multiplanar reconstructed images including MIPs were obtained and reviewed to evaluate the vascular anatomy.  Contrast: OMNIPAQUE IOHEXOL 350 MG/ML IV SOLN  Comparison: None.  Findings: The chest wall is unremarkable.  No mass lesions.  Small axillary lymph nodes are noted.  No supraclavicular adenopathy.  There are acute or subacute appearing fractures involving the right seventh and eighth ribs.  There are also destructive appearing changes involving the fourth and fifth anterior ribs.  There is also a destructive bony process involving the upper sternum with possible pathologic fractures.  Small vertebral body lesions are suspected.  A whole-body bone scan may be helpful for further evaluation these findings.  Does this patient have a known cancer?  The heart is normal in size.  No pericardial effusion.  There are borderline mediastinal and right hilar lymph nodes.  The largest right hilar node measures 21 mm.  The esophagus is grossly normal.  The aorta is normal in caliber.  No dissection.  The pulmonary arterial tree is fairly well opacified.  No filling defects to suggest pulmonary emboli.  Examination of the lung parenchyma demonstrates no acute pulmonary findings.  No worrisome mass or nodule.  No pleural effusion.   The upper abdomen demonstrates a fatty liver.  There is a small enhancing splenic lesion which is likely a benign hemangioma.  IMPRESSION:  1.  Rib fractures and suspected bone lesions worrisome for metastatic disease.  Recommend clinical correlation.  Whole-body bone scan may be helpful to assess the rest of the bony skeleton. 2.  No CT findings for pulmonary embolism. 3.  No worrisome pulmonary nodules or mass lesions. 4.  Borderline mediastinal and right hilar lymph nodes. 5.  Probable splenic hemangioma.  Original Report Authenticated By: P. Loralie Champagne, M.D.   Dg Chest Portable 1 View 03/23/2012  *RADIOLOGY REPORT*  Clinical Data: Loss of consciousness.  PORTABLE CHEST - 1 VIEW  Comparison: 02/26/2012.  Findings: The heart is upper limits of normal in size and stable. The lungs are clear.  No pleural effusion.  The bony thorax is intact.  IMPRESSION: No acute cardiopulmonary findings.  Original Report Authenticated By: P. Loralie Champagne, M.D.     1235:  T/C to Cards Dr. Diona Browner, case discussed, including:  HPI, pertinent PM/SHx, VS/PE, dx testing, ED course and treatment:  States holter monitor does NOT show any arrythmia, bradycardia or tachycardia, pauses, etc, to account for pt's syncopal episodes; doubts cough as cause for syncope given no bradycardic episodes; doubts cardiac cause for syncope at this time.  1410:  Neuro exam unchanged, VSS.  Dx testing d/w pt.  Questions answered.  Verb understanding, agreeable to admit.  T/C to [facility] Dr. Lendell Caprice, case discussed, including:  HPI, pertinent PM/SHx, VS/PE, dx testing, ED course and treatment:  Agreeable to admit, requests to obtain tele bed to team 1.    I personally performed the services described in this documentation, which was scribed in my presence. The recorded information has been reviewed and considered. Gerry Blanchfield Allison Quarry, DO 03/25/12 2030

## 2012-03-23 NOTE — Telephone Encounter (Signed)
Please call to check on him.

## 2012-03-23 NOTE — Telephone Encounter (Signed)
Reviewed. Will follow-up at instructed.

## 2012-03-23 NOTE — Telephone Encounter (Signed)
**Note De-identified  Obfuscation** LMOM./LV 

## 2012-03-23 NOTE — ED Notes (Signed)
Attempted to call report.  Receiving RN is at lunch and will call back.

## 2012-03-23 NOTE — ED Notes (Signed)
Pt up to bathroom without assistance.  nad noted. 

## 2012-03-23 NOTE — ED Notes (Signed)
2nd attempt to call report.  No answer from receiving rN.  Will call back.
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
## 2012-03-24 ENCOUNTER — Inpatient Hospital Stay (HOSPITAL_COMMUNITY): Payer: No Typology Code available for payment source

## 2012-03-24 ENCOUNTER — Encounter (HOSPITAL_COMMUNITY): Payer: Self-pay

## 2012-03-24 ENCOUNTER — Inpatient Hospital Stay (HOSPITAL_COMMUNITY)
Admit: 2012-03-24 | Discharge: 2012-03-24 | Disposition: A | Discharge status: Home / Self Care | Payer: No Typology Code available for payment source | Attending: Internal Medicine | Admitting: Internal Medicine

## 2012-03-24 LAB — GLUCOSE, CAPILLARY
Glucose-Capillary: 230 mg/dL — ABNORMAL HIGH (ref 70–99)
Glucose-Capillary: 254 mg/dL — ABNORMAL HIGH (ref 70–99)

## 2012-03-24 LAB — TSH: TSH: 1.216 u[IU]/mL (ref 0.350–4.500)

## 2012-03-24 MED ORDER — TECHNETIUM TC 99M MEDRONATE IV KIT
25.0000 | PACK | Freq: Once | INTRAVENOUS | Status: AC | PRN
  Administered 2012-03-24: 25 via INTRAVENOUS

## 2012-03-24 NOTE — Progress Notes (Signed)
utilization review completed

## 2012-03-24 NOTE — Progress Notes (Signed)
Subjective: No further episodes. Objective: Vital signs in last 24 hours: Filed Vitals:   03/24/12 0720 03/24/12 1325 03/24/12 1326 03/24/12 1327  BP:  147/77 137/65 146/70  Pulse:  70 73 76  Temp:  98.2 F (36.8 C)    TempSrc:      Resp:  20    Height:      Weight:      SpO2: 94% 94%     Weight change:   Intake/Output Summary (Last 24 hours) at 03/24/12 1715 Last data filed at 03/24/12 0800  Gross per 24 hour  Intake    843 ml  Output      0 ml  Net    843 ml   Physical Exam: Exam unchanged from 03/23/2012 Lab Results: Basic Metabolic Panel:  Lab 03/23/12 7829  NA 132*  K 3.8  CL 94*  CO2 25  GLUCOSE 244*  BUN 15  CREATININE 0.73  CALCIUM 9.6  MG --  PHOS --   Liver Function Tests: No results found for this basename: AST:2,ALT:2,ALKPHOS:2,BILITOT:2,PROT:2,ALBUMIN:2 in the last 168 hours No results found for this basename: LIPASE:2,AMYLASE:2 in the last 168 hours No results found for this basename: AMMONIA:2 in the last 168 hours CBC:  Lab 03/23/12 0947  WBC 9.1  NEUTROABS 5.3  HGB 13.2  HCT 38.4*  MCV 85.1  PLT 270   Cardiac Enzymes:  Lab 03/23/12 0947  CKTOTAL --  CKMB --  CKMBINDEX --  TROPONINI <0.30   BNP: No results found for this basename: PROBNP:3 in the last 168 hours D-Dimer:  Lab 03/23/12 0947  DDIMER 0.50*   CBG:  Lab 03/24/12 1612 03/24/12 1130 03/24/12 0715 03/23/12 2059 03/23/12 1619  GLUCAP 254* 230* 199* 298* 296*   Hemoglobin A1C: No results found for this basename: HGBA1C in the last 168 hours Fasting Lipid Panel: No results found for this basename: CHOL,HDL,LDLCALC,TRIG,CHOLHDL,LDLDIRECT in the last 562 hours Thyroid Function Tests: No results found for this basename: TSH,T4TOTAL,FREET4,T3FREE,THYROIDAB in the last 168 hours Coagulation: No results found for this basename: LABPROT:4,INR:4 in the last 168 hours Anemia Panel: No results found for this basename: VITAMINB12,FOLATE,FERRITIN,TIBC,IRON,RETICCTPCT in the  last 168 hours Urine Drug Screen: Drugs of Abuse  No results found for this basename: labopia, cocainscrnur, labbenz, amphetmu, thcu, labbarb    Alcohol Level:  Micro Results: No results found for this or any previous visit (from the past 240 hour(s)). Studies/Results: Ct Head Wo Contrast  03/23/2012  *RADIOLOGY REPORT*  Clinical Data: Syncopal episode.  CT HEAD WITHOUT CONTRAST  Technique:  Contiguous axial images were obtained from the base of the skull through the vertex without contrast.  Comparison: 02/26/2012.  Findings: The ventricles are normal.  No extra-axial fluid collections are seen.  The brainstem and cerebellum are unremarkable.  No acute intracranial findings such as infarction or hemorrhage.  No mass lesions.  The bony calvarium is intact.  The visualized paranasal sinuses and mastoid air cells are clear.  IMPRESSION: No acute intracranial findings or mass lesion.  No change since recent prior study  Original Report Authenticated By: P. Loralie Champagne, M.D.   Ct Angio Chest W/cm &/or Wo Cm  03/23/2012  *RADIOLOGY REPORT*  Clinical Data: Shortness of breath.  Elevated D-dimer.  CT ANGIOGRAPHY CHEST  Technique:  Multidetector CT imaging of the chest using the standard protocol during bolus administration of intravenous contrast. Multiplanar reconstructed images including MIPs were obtained and reviewed to evaluate the vascular anatomy.  Contrast: OMNIPAQUE IOHEXOL 350 MG/ML IV SOLN  Comparison: None.  Findings: The chest wall is unremarkable.  No mass lesions.  Small axillary lymph nodes are noted.  No supraclavicular adenopathy.  There are acute or subacute appearing fractures involving the right seventh and eighth ribs.  There are also destructive appearing changes involving the fourth and fifth anterior ribs.  There is also a destructive bony process involving the upper sternum with possible pathologic fractures.  Small vertebral body lesions are suspected.  A whole-body bone  scan may be helpful for further evaluation these findings.  Does this patient have a known cancer?  The heart is normal in size.  No pericardial effusion.  There are borderline mediastinal and right hilar lymph nodes.  The largest right hilar node measures 21 mm.  The esophagus is grossly normal. The aorta is normal in caliber.  No dissection.  The pulmonary arterial tree is fairly well opacified.  No filling defects to suggest pulmonary emboli.  Examination of the lung parenchyma demonstrates no acute pulmonary findings.  No worrisome mass or nodule.  No pleural effusion.  The upper abdomen demonstrates a fatty liver.  There is a small enhancing splenic lesion which is likely a benign hemangioma.  IMPRESSION:  1.  Rib fractures and suspected bone lesions worrisome for metastatic disease.  Recommend clinical correlation.  Whole-body bone scan may be helpful to assess the rest of the bony skeleton. 2.  No CT findings for pulmonary embolism. 3.  No worrisome pulmonary nodules or mass lesions. 4.  Borderline mediastinal and right hilar lymph nodes. 5.  Probable splenic hemangioma.  Original Report Authenticated By: P. Loralie Champagne, M.D.   US Carotid Duplex Bilateral  03/24/2012  *RADIOLOGY REPORT*  Clinical Data:   syncope, hypertension, diabetes.  BILATERAL CAROTID DUPLEX ULTRASOUND  Technique: Wallace Cullens scale imaging, color Doppler and duplex ultrasound was performed of bilateral carotid and vertebral arteries in the neck.  Comparison: None  Criteria:  Quantification of carotid stenosis is based on velocity parameters that correlate the residual internal carotid diameter with NASCET-based stenosis levels, using the diameter of the distal internal carotid lumen as the denominator for stenosis measurement.  The following velocity measurements were obtained:                   PEAK SYSTOLIC/END DIASTOLIC RIGHT ICA:                        74/18cm/sec CCA:                        99/50cm/sec SYSTOLIC ICA/CCA RATIO:     0.74  DIASTOLIC ICA/CCA RATIO:    1.21 ECA:                        139cm/sec  LEFT ICA:                        115/26cm/sec CCA:                        124/22cm/sec SYSTOLIC ICA/CCA RATIO:     0.93 DIASTOLIC ICA/CCA RATIO:    1.18 ECA:                        137cm/sec  Findings:  RIGHT CAROTID ARTERY: There is diffuse intimal thickening.  There is focal eccentric partially calcified plaque in the carotid bulb and at  the ICA origin without high-grade stenosis.  Normal wave forms and color Doppler signal.  RIGHT VERTEBRAL ARTERY:  Normal flow direction and waveform.  LEFT CAROTID ARTERY: Smooth nonocclusive plaque in the mid and distal common carotid artery.  Intimal thickening in the carotid bulb and proximal ICA without focal plaque accumulation or stenosis.  Normal wave forms and color Doppler signal.  LEFT VERTEBRAL ARTERY:  Normal flow direction and waveform.  IMPRESSION:  1.  Early bilateral carotid plaque, resulting in less than 50% diameter stenosis. The exam does not exclude plaque ulceration or embolization.  Continued surveillance recommended.  Original Report Authenticated By: Osa Craver, M.D.   Dg Chest Portable 1 View  03/23/2012  *RADIOLOGY REPORT*  Clinical Data: Loss of consciousness.  PORTABLE CHEST - 1 VIEW  Comparison: 02/26/2012.  Findings: The heart is upper limits of normal in size and stable. The lungs are clear.  No pleural effusion.  The bony thorax is intact.  IMPRESSION: No acute cardiopulmonary findings.  Original Report Authenticated By: P. Loralie Champagne, M.D.   EEG pending  Scheduled Meds:   . amLODipine  5 mg Oral Daily  . aspirin EC  325 mg Oral Daily  . atorvastatin  20 mg Oral q1800  . budesonide-formoterol  2 puff Inhalation BID  . cholecalciferol  1,000 Units Oral Daily  . citalopram  10 mg Oral Daily  . heparin  5,000 Units Subcutaneous Q8H  . hydrochlorothiazide  12.5 mg Oral Daily  . insulin aspart  20 Units Subcutaneous Q breakfast  . insulin detemir  50  Units Subcutaneous QHS  . latanoprost  1 drop Right Eye QHS  . naproxen  500 mg Oral BID WC  . pneumococcal 23 valent vaccine  0.5 mL Intramuscular Tomorrow-1000  . ramipril  10 mg Oral Daily  . sodium chloride  3 mL Intravenous Q12H  . timolol  1 drop Right Eye q morning - 10a  . DISCONTD: insulin aspart  20-45 Units Subcutaneous See admin instructions  . DISCONTD: simvastatin  20 mg Oral Daily  . DISCONTD: terazosin  5 mg Oral Daily   Continuous Infusions:  PRN Meds:.acetaminophen, acetaminophen, benzonatate, diazepam, magnesium hydroxide, polyvinyl alcohol, technetium medronate, DISCONTD: Propylene Glycol Assessment/Plan: Principal Problem:  *Syncope and collapse Active Problems:  Diabetes mellitus  Essential hypertension, benign  Hyperlipidemia  Glaucoma  Anxiety  abnormal CT scan  Await EEG and bone scan. Appreciate Dr. Ronal Fear assistance.   LOS: 1 day   Aeriana Speece L 03/24/2012, 5:15 PM

## 2012-03-24 NOTE — Progress Notes (Signed)
Inpatient Diabetes Program Recommendations  AACE/ADA: New Consensus Statement on Inpatient Glycemic Control  Target Ranges:  Prepandial:   less than 140 mg/dL      Peak postprandial:   less than 180 mg/dL (1-2 hours)      Critically ill patients:  140 - 180 mg/dL  Pager:  [phone] Hours:  8 am-10pm   Reason for Visit: Elevated glucose:  Results for Dale, [name] (MRN [id number]) as of 03/24/2012 18:15  Ref. Range 03/24/2012 07:15 03/24/2012 11:30 03/24/2012 16:12  Glucose-Capillary Latest Range: 70-99 mg/dL 147 (H) 829 (H) 562 (H)    Inpatient Diabetes Program Recommendations Correction (SSI): Add Novolog Correction Insulin - Meal Coverage: Add meal coverage with lunch and dinner as well

## 2012-03-24 NOTE — Progress Notes (Signed)
CARE MANAGEMENT NOTE 03/24/2012  Patient:  [name], [name]   Account Number:  [id number]  Date Initiated:  03/24/2012  Documentation initiated by:  Rosemary Holms  Subjective/Objective Assessment:   Pt admitted with possible syncopal episodes. Lives home alone and independent. No real family support.     Action/Plan:   Spoke to pt at bedside. No HH needs identified at this time. Will follow.   Anticipated DC Date:  03/26/2012   Anticipated DC Plan:  HOME/SELF CARE      DC Planning Services  CM consult      Choice offered to / List presented to:             Status of service:  In process, will continue to follow Medicare Important Message given?   (If response is "NO", the following Medicare IM given date fields will be blank) Date Medicare IM given:   Date Additional Medicare IM given:    Discharge Disposition:    Per UR Regulation:    If discussed at Long Length of Stay Meetings, dates discussed:    Comments:  03/24/12 1430 Lucan Riner Leanord Hawking RN BSN CM
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
## 2012-03-25 LAB — GLUCOSE, CAPILLARY
Glucose-Capillary: 142 mg/dL — ABNORMAL HIGH (ref 70–99)
Glucose-Capillary: 189 mg/dL — ABNORMAL HIGH (ref 70–99)

## 2012-03-25 MED ORDER — LEVETIRACETAM 500 MG PO TABS
250.0000 mg | ORAL_TABLET | Freq: Two times a day (BID) | ORAL | Status: DC
  Administered 2012-03-25 – 2012-03-26 (×3): 250 mg via ORAL
  Filled 2012-03-25 (×3): qty 1

## 2012-03-25 MED ORDER — INSULIN DETEMIR 100 UNIT/ML ~~LOC~~ SOLN
55.0000 [IU] | Freq: Every day | SUBCUTANEOUS | Status: DC
  Administered 2012-03-25: 55 [IU] via SUBCUTANEOUS
  Filled 2012-03-25: qty 10
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
## 2012-03-25 NOTE — Procedures (Signed)
NAMEFERREL, SIMINGTON NO.:  [id number]  MEDICAL RECORD NO.:  [id number]  LOCATION:  A307                          FACILITY:  [facility]  PHYSICIAN:  Angelyna Henderson A. Gerilyn Pilgrim, M.D. DATE OF BIRTH:  March 02, 1933  DATE OF PROCEDURE: DATE OF DISCHARGE:                             EEG INTERPRETATION   This is a 78 year old man, who was admitted for multiple spells of syncope.  Apparently, the patient starts coughing and gets sweat and he passes out.  MEDICATIONS:  Norvasc, Lipitor, Tessalon, Symbicort, Celexa, Flexeril, Valium, insulin, and Altace.  ANALYSIS:  This is a 16-channel recording using standard 10-20 measurements is conducted for 22 minutes.  There is a well-formed posterior dominant rhythm of 10-10.5 Hz, which attenuates with eye opening.  There is awake and drowsy activities observed.  Photic stimulation is carried out without abnormal responses.  There is no focal or lateralized slowing.  The patient did have a spell of coughing, apparently the same way he feels before he has 1 of these spells, he did not pass out, however.  This was preceded by brief transient left temporal sharp wave activity.  ASSESSMENT:  Mildly abnormal recording showing transient left temporal sharp wave activity.  This potentially may serve as an epileptic focus.     Goldie Tregoning A. Gerilyn Pilgrim, M.D.     KAD/MEDQ  D:  03/25/2012  T:  03/25/2012  Job:  [id number]
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
## 2012-03-25 NOTE — Progress Notes (Deleted)
CARE MANAGEMENT NOTE 03/25/2012  Patient:  [name], [name]   Account Number:  [id number]  Date Initiated:  03/24/2012  Documentation initiated by:  Rosemary Holms  Subjective/Objective Assessment:   Pt admitted with possible syncopal episodes. Lives home alone and independent. No real family support.     Action/Plan:   Spoke to pt at bedside. No HH needs identified at this time. Will follow.   Anticipated DC Date:  03/26/2012   Anticipated DC Plan:  HOME W HOME HEALTH SERVICES      DC Planning Services  CM consult      Choice offered to / List presented to:          [facility] arranged  HH-1 RN  HH-2 PT      [facility] agency  [facility]   Status of service:  In process, will continue to follow Medicare Important Message given?   (If response is "NO", the following Medicare IM given date fields will be blank) Date Medicare IM given:   Date Additional Medicare IM given:    Discharge Disposition:    Per UR Regulation:    If discussed at Long Length of Stay Meetings, dates discussed:    Comments:  03/25/12 1400 Husam Hohn Leanord Hawking RN BSN CM Pt agreed that [facility] PT will be needed and will consider RN.  03/24/12 1430 Ajna Moors Leanord Hawking RN BSN CM
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
## 2012-03-26 ENCOUNTER — Encounter (HOSPITAL_COMMUNITY): Payer: Self-pay | Admitting: Internal Medicine

## 2012-03-26 DIAGNOSIS — G40909 Epilepsy, unspecified, not intractable, without status epilepticus: Secondary | ICD-10-CM

## 2012-03-26 HISTORY — DX: Epilepsy, unspecified, not intractable, without status epilepticus: G40.909

## 2012-03-26 MED ORDER — BENZONATATE 200 MG PO CAPS: 200.0000 mg | ORAL_CAPSULE | Freq: Three times a day (TID) | ORAL | Status: DC | PRN

## 2012-03-26 MED ORDER — LEVETIRACETAM 250 MG PO TABS: 250.0000 mg | ORAL_TABLET | Freq: Two times a day (BID) | ORAL | Status: DC
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
## 2012-03-26 NOTE — Progress Notes (Signed)
Patient given discharge papers with no new questions. Given prescriptions and explained to patient dosing of new meds. Patient reinforced learning no to drive at this time. Left with brother-in-law via personal vehicle. Taken out of facility via wheelchair.

## 2012-03-26 NOTE — Progress Notes (Signed)
Ambulated patient in hall, walked a total of 350 ft, tolerated well, no s/s of distress, shortness of breath or syncope episodes noted

## 2012-03-26 NOTE — Progress Notes (Signed)
NAMESCORPIO, FORTIN NO.:  [id number]  MEDICAL RECORD NO.:  [id number]  LOCATION:  A307                          FACILITY:  [facility]  PHYSICIAN:  Carl Butner A. Gerilyn Pilgrim, M.D. DATE OF BIRTH:  May 09, 1933  DATE OF PROCEDURE: DATE OF DISCHARGE:  03/26/2012                                PROGRESS NOTE   The patient reports that he has tolerated the Keppra very well. Actually, he thinks it has helped his symptoms, he was having a pressure- like sensation in the back of the head on the left and that was resolved, feels a lot better today.  We will continue with the Keppra at low dose.  We will have the patient follow up with [place] in the office in about 2-4 weeks.     Levita Monical A. Gerilyn Pilgrim, M.D.     KAD/MEDQ  D:  03/26/2012  T:  03/26/2012  Job:  [id number]

## 2012-04-03 ENCOUNTER — Other Ambulatory Visit: Payer: Self-pay | Admitting: Cardiology

## 2012-04-03 ENCOUNTER — Ambulatory Visit (INDEPENDENT_AMBULATORY_CARE_PROVIDER_SITE_OTHER): Payer: No Typology Code available for payment source | Admitting: Cardiology

## 2012-04-03 ENCOUNTER — Ambulatory Visit: Payer: No Typology Code available for payment source | Admitting: Cardiology

## 2012-04-03 ENCOUNTER — Encounter: Payer: Self-pay | Admitting: Cardiology

## 2012-04-03 VITALS — BP 143/76 | HR 80 | Wt 232.0 lb

## 2012-04-03 DIAGNOSIS — R55 Syncope and collapse: Secondary | ICD-10-CM

## 2012-04-03 DIAGNOSIS — I10 Essential (primary) hypertension: Secondary | ICD-10-CM

## 2012-04-03 NOTE — Assessment & Plan Note (Signed)
No changes to medical regimen. Keep followup with Dr. Christell Constant.

## 2012-04-03 NOTE — Patient Instructions (Signed)
**Note De-identified  Obfuscation** Your physician recommends that you continue on your current medications as directed. Please refer to the Current Medication list given to you today.  Your physician recommends that you schedule a follow-up appointment in: as needed  

## 2012-04-03 NOTE — Progress Notes (Signed)
Clinical Summary [name] is a 78 y.o.male presenting for followup. He was seen in March for evaluation of syncope. Prior workup noted in the previous note. We placed a cardiac monitor for exclusion of arrhythmia.  Cardionet reviewed showing sinus rhythm and sinus tachycardia without any pauses or arrhythmias, even in the setting of recurrent syncope. He was ultimately admitted to [facility] and has been seen by Dr. Gerilyn Pilgrim with subsequent diagnosis of seizure disorder. He is on Keppra at this point. He had had no further syncope.   Allergies  Allergen Reactions  . Mucinex     Believes that it makes him "black out"    Current Outpatient Prescriptions  Medication Sig Dispense Refill  . amLODipine (NORVASC) 5 MG tablet Take 5 mg by mouth daily.      [name] aspirin EC 325 MG tablet Take 325 mg by mouth daily.      [name] atorvastatin (LIPITOR) 20 MG tablet Take 20 mg by mouth at bedtime.       . benzonatate (TESSALON) 200 MG capsule Take 1 capsule (200 mg total) by mouth 3 (three) times daily as needed. For cough  20 capsule  0  . budesonide-formoterol (SYMBICORT) 160-4.5 MCG/ACT inhaler Inhale 2 puffs into the lungs 2 (two) times daily.      . cholecalciferol (VITAMIN D) 1000 UNITS tablet Take 1,000 Units by mouth daily.      . citalopram (CELEXA) 10 MG tablet Take 1 tablet (10 mg total) by mouth daily.  30 tablet  3  . cyclobenzaprine (FLEXERIL) 10 MG tablet Take 10 mg by mouth 3 (three) times daily as needed. For back pain      . diazepam (VALIUM) 5 MG tablet Take 5 mg by mouth 2 (two) times daily as needed. Nerves      . hydrochlorothiazide (MICROZIDE) 12.5 MG capsule Take 12.5 mg by mouth daily.      [name] HYDROcodone-acetaminophen (VICODIN) 5-500 MG per tablet Take 1 tablet by mouth every 4 (four) hours as needed. For pain      . insulin aspart (NOVOLOG) 100 UNIT/ML injection Inject 20-45 Units into the skin See admin instructions. CHECKS BLOOD SUGAR 3 TIMES A DAY PER SLIDING SCALE      .  insulin detemir (LEVEMIR) 100 UNIT/ML injection Inject 50 Units into the skin at bedtime.       [name] latanoprost (XALATAN) 0.005 % ophthalmic solution Place 1 drop into the right eye at bedtime.      . levETIRAcetam (KEPPRA) 250 MG tablet Take 1 tablet (250 mg total) by mouth 2 (two) times daily. For treatment of seizures.  60 tablet  2  . naproxen (NAPROSYN) 500 MG tablet Take 500 mg by mouth 2 (two) times daily with a meal.      . Propylene Glycol (SYSTANE BALANCE) 0.6 % SOLN Apply 2 drops to eye daily as needed.      . ramipril (ALTACE) 10 MG capsule Take 10 mg by mouth daily.      [name] terazosin (HYTRIN) 5 MG capsule Take 5 mg by mouth daily.      . timolol (TIMOPTIC) 0.5 % ophthalmic solution Place 1 drop into the right eye every morning.        Past Medical History  Diagnosis Date  . Asthma   . Essential hypertension, benign   . Personality disorder   . Type 2 diabetes mellitus   . Hyperlipidemia   . BPH (benign prostatic hypertrophy)   . Back pain   .  Anxiety     states has "nerves"  . Diabetes mellitus   . Seizure disorder 03/26/2012  . Rib fractures March 2013    Appears traumatic and not pathologic per bone scan    Past Surgical History  Procedure Date  . Right eye surgery 2012    Prior childhood injury    Social History Mr. Foiles reports that he quit smoking about 8 years ago. His smoking use included Cigarettes. He quit smokeless tobacco use about 61 years ago. Mr. Cutler reports that he does not drink alcohol.  Review of Systems Negative except as outlined.  Physical Examination Filed Vitals:   04/03/12 1436  BP: 143/76  Pulse: 80   Obese male in NAD.  HEENT: Conjunctiva and lids normal, oropharynx clear.  Neck: Supple, no elevated JVP or carotid bruits, no thyromegaly.  Lungs: Clear to auscultation, nonlabored breathing at rest.  Cardiac: Regular rate and rhythm, no S3 or significant systolic murmur, no pericardial rub.  Abdomen: Soft, nontender,  protuberant, bowel sounds present, no guarding or rebound.  Extremities: Trace edema, distal pulses 2+.  Skin: Warm and dry.  Musculoskeletal: No kyphosis.  Neuropsychiatric: Alert and oriented x3, calm, somewhat tangential.     Problem List and Plan

## 2012-04-03 NOTE — Assessment & Plan Note (Signed)
Looks to be secondary to seizures based on recent evaluation. He had no cardiac arrhythmias with events by monitor and LVEF is normal. No further cardiac workup planned at this time.

## 2012-04-13 ENCOUNTER — Telehealth: Payer: Self-pay | Admitting: Internal Medicine
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
## 2012-04-14 NOTE — Telephone Encounter (Signed)
Called pt - he was not sure what he needed to have done- I called Dr Gerilyn Pilgrim- they have referred him to see [place] for dysphagia and GERD- I called Mr Bahner back and informed him of this & scheduled him an appt for 04/17 @ 10

## 2012-04-15 ENCOUNTER — Other Ambulatory Visit: Payer: Self-pay | Admitting: Internal Medicine

## 2012-04-15 ENCOUNTER — Encounter: Payer: Self-pay | Admitting: Urgent Care

## 2012-04-15 ENCOUNTER — Ambulatory Visit (INDEPENDENT_AMBULATORY_CARE_PROVIDER_SITE_OTHER): Payer: No Typology Code available for payment source | Admitting: Urgent Care

## 2012-04-15 DIAGNOSIS — K219 Gastro-esophageal reflux disease without esophagitis: Secondary | ICD-10-CM | POA: Insufficient documentation

## 2012-04-15 DIAGNOSIS — Z1211 Encounter for screening for malignant neoplasm of colon: Secondary | ICD-10-CM

## 2012-04-15 DIAGNOSIS — R1310 Dysphagia, unspecified: Secondary | ICD-10-CM | POA: Insufficient documentation

## 2012-04-15 MED ORDER — PANTOPRAZOLE SODIUM 40 MG PO TBEC: 40.0000 mg | DELAYED_RELEASE_TABLET | Freq: Every day | ORAL | Status: DC

## 2012-04-15 MED ORDER — SOD PICOSULFATE-MAG OX-CIT ACD 10-3.5-12 MG-GM-GM PO PACK: 1.0000 | PACK | Freq: Once | ORAL | Status: DC
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
## 2012-04-15 NOTE — Patient Instructions (Addendum)
Begin Protonix 40 mg daily You will need an EGD (upper endoscopy), possible dilation of your esophagus for your swallowing problems and a screening colonoscopy with Dr. Darrick Penna in the near future.  Dysphagia Diet Level 2, Mechanically Altered This dysphagia mechanically altered diet is restricted to:  Foods that are moist, soft-textured, and easy to chew and swallow.   Meats that are ground or minced to no larger than -inch pieces. Meats are moist with gravy or sauce added.   Foods that do not include bread or bread-like textures except soft pancakes, well-moistened with syrup or sauce.   Textures with some chewing ability required.   Casseroles without rice.   Cooked vegetables that are less than -inch in size and easily mashed with a fork. No cooked corn, peas, broccoli, cauliflower, cabbage, Brussels sprouts, asparagus, or other fibrous, non-tender, or rubbery cooked vegetables.   Canned fruit except for pineapple. Fruit must be cut into no larger than -inch pieces.   Foods that do not include nuts, seeds, coconut, or sticky textures.  FOOD TEXTURES Includes all foods listed on Dysphagia Diet Level 1, Pureed, in addition to the foods listed below. Beverages  Recommended: All beverages thickened to recommended consistency with minimal amounts of texture pulp. Any texture should be suspended in the liquid and should not fall out.   Avoid: All others.   You are currently limited to one of the following liquid consistency levels:   Thin.   Nectar-like.   Honey-like.   Spoon-thick.  Breads  Recommended: Soft pancakes, well-moistened with syrup or sauce.   Avoid: All others.  Cereals  Recommended: Cooked cereals with little texture, including oatmeal. Unprocessed wheat bran stirred into cereals for bulk. If thin liquids are restricted, it is important that all of the liquid is absorbed into the cereal.   Avoid: All dry cereals and any cooked cereals that may contain flax  seeds or other seeds or nuts. Whole-grain, dry, or coarse cereals. Cereals with nuts, seeds, dried fruit, or coconut.  Desserts  Recommended: Pudding, custard. Soft fruit pies with bottom crust only. Canned fruit (excluding pineapple). Soft, moist cakes with icing.   Avoid: Dry, coarse cakes and cookies. Anything with nuts, seeds, coconut, pineapple, or dried fruit. Breakfast yogurt with nuts. Rice or bread pudding.   These foods are considered thin liquids and should be avoided if thin liquids are restricted:   Frozen malts, milk shakes, frozen yogurt, eggnog, nutritional supplements, ice cream, sherbet, regular or sugar-free gelatin, or any foods that become thin liquid at either room temperature, 70 F (21.1 C) or body temperature, 98 F (36.7 C).  Fats  Recommended: Butter, margarine, cream for cereal (depending on liquid consistency recommendations), gravy, cream sauces, sour cream, sour cream dips with soft additives, mayonnaise, salad dressings, cream cheese, cream cheese spreads with soft additives, whipped toppings.   Avoid: All fats with coarse or chunky additives.  Fruits  Recommended: Soft drained, canned, or cooked fruits without seeds or skin. Fresh soft and ripe banana. Fruit juices with a small amount of pulp. If thin liquids are restricted, fruit juices should be thickened to appropriate consistency.   Avoid: Fresh or frozen fruits. Cooked fruit with skin or seeds. Dried fruits. Fresh, canned, or cooked pineapple.  Meats and Meat Substitutes Meat pieces should not exceed -inch cubes and should be tender.  Recommended: Moistened ground or cooked meat, poultry, or fish. Moist ground or tender meat may be served with gravy or sauce. Casseroles without rice. Moist macaroni and  cheese, well-cooked pasta with meat sauce, tuna noodle casserole, soft, moist lasagna. Moist meatballs, meatloaf, or fish loaf. Protein salads, such as tuna or egg without large chunks, celery, or onion.  Cottage cheese, smooth quiche without large chunks. Poached, scrambled, or soft-cooked eggs (egg yolks should not be "runny" but should be moist and able to be mashed with butter, margarine, or other moisture added to them). Cook eggs to 160 F (71.1 C) or use pasteurized eggs for safety. Souffls may have small, soft chunks. Tofu. Well-cooked, slightly mashed, moist legumes such as baked beans. All meats or protein substitutes should be served with sauces or moistened to help maintain cohesiveness in the mouth.   Avoid: Dry meats and tough meats, such as bacon, sausage, hot dogs, and bratwurst. Dry casseroles or casseroles with rice or large chunks. Peanut butter. Cheese slices and cubes. Hard-cooked or crisp fried eggs. Sandwiches. Pizza.  Potatoes and Starches  Recommended: Well-cooked, moistened, boiled, baked, or mashed potatoes. Well-cooked shredded hash brown potatoes that are not crisp. All potatoes need to be moist and in sauces. Well-cooked noodles in sauce. Spaetzel or soft dumplings that have been moistened with butter or gravy.   Avoid: Potato skins and chips. Fried or French-fried potatoes. Rice.  Soups  Recommended: Soups with easy-to-chew or easy-to-swallow meats or vegetables. Contents in soups should be less than -inch pieces. Soups will need to be thickened to appropriate consistency if soup is thinner than prescribed liquid consistency.   Avoid: Soups with large chunks of meat and vegetables. Soups with rice, corn, peas.  Vegetables  Recommended: All soft, well-cooked vegetables. Vegetables should be less than -inch pieces. They should be easily mashed with a fork.   Avoid: Cooked corn and peas. Broccoli, cabbage, Brussels sprouts, asparagus, or other fibrous, non-tender, or rubbery cooked vegetables.  Miscellaneous  Recommended: Jams and preserves without seeds, jelly. Sauces or salsas with small, tender, less than -inch pieces. Soft, smooth chocolate bars that are easily  chewed.   Avoid: Seeds, nuts, coconut, or sticky foods. Chewy candies such as caramels or licorice.  Document Released: 12/16/2005 Document Revised: 12/05/2011 Document Reviewed: 01/08/2010 [facility] Patient Information 2012 [place], [place].

## 2012-04-16 ENCOUNTER — Encounter: Payer: Self-pay | Admitting: Urgent Care

## 2012-04-16 DIAGNOSIS — Z1211 Encounter for screening for malignant neoplasm of colon: Secondary | ICD-10-CM | POA: Insufficient documentation

## 2012-04-16 NOTE — Progress Notes (Signed)
Faxed to PCP & Dr Gerilyn Pilgrim

## 2012-04-16 NOTE — Assessment & Plan Note (Signed)
Due for average risk screening colonoscopy.  Will proceed at the same time as EGD.  I have discussed risks & benefits which include, but are not limited to, bleeding, infection, perforation & drug reaction.  The patient agrees with this plan & written consent will be obtained.

## 2012-04-16 NOTE — Assessment & Plan Note (Signed)
See dysphagia. ?
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
## 2012-04-30 NOTE — Progress Notes (Signed)
REVIEWED.  

## 2012-05-04 MED ORDER — SODIUM CHLORIDE 0.45 % IV SOLN
Freq: Once | INTRAVENOUS | Status: AC
  Administered 2012-05-05: 09:00:00 via INTRAVENOUS

## 2012-05-05 ENCOUNTER — Encounter (HOSPITAL_COMMUNITY)
Admission: RE | Disposition: A | Discharge status: Home / Self Care | Payer: Self-pay | Source: Ambulatory Visit | Attending: Gastroenterology

## 2012-05-05 ENCOUNTER — Encounter (HOSPITAL_COMMUNITY): Payer: Self-pay | Admitting: *Deleted

## 2012-05-05 ENCOUNTER — Ambulatory Visit (HOSPITAL_COMMUNITY)
Admission: RE | Admit: 2012-05-05 | Discharge: 2012-05-05 | Disposition: A | Discharge status: Home / Self Care | Payer: No Typology Code available for payment source | Source: Ambulatory Visit | Attending: Gastroenterology | Admitting: Gastroenterology

## 2012-05-05 ENCOUNTER — Telehealth: Payer: Self-pay | Admitting: Gastroenterology

## 2012-05-05 DIAGNOSIS — D131 Benign neoplasm of stomach: Secondary | ICD-10-CM

## 2012-05-05 DIAGNOSIS — K209 Esophagitis, unspecified: Secondary | ICD-10-CM

## 2012-05-05 DIAGNOSIS — E119 Type 2 diabetes mellitus without complications: Secondary | ICD-10-CM | POA: Insufficient documentation

## 2012-05-05 DIAGNOSIS — K2990 Gastroduodenitis, unspecified, without bleeding: Secondary | ICD-10-CM | POA: Insufficient documentation

## 2012-05-05 DIAGNOSIS — F609 Personality disorder, unspecified: Secondary | ICD-10-CM | POA: Insufficient documentation

## 2012-05-05 DIAGNOSIS — R490 Dysphonia: Secondary | ICD-10-CM | POA: Insufficient documentation

## 2012-05-05 DIAGNOSIS — Z1211 Encounter for screening for malignant neoplasm of colon: Secondary | ICD-10-CM | POA: Insufficient documentation

## 2012-05-05 DIAGNOSIS — J45909 Unspecified asthma, uncomplicated: Secondary | ICD-10-CM | POA: Insufficient documentation

## 2012-05-05 DIAGNOSIS — K2970 Gastritis, unspecified, without bleeding: Secondary | ICD-10-CM | POA: Insufficient documentation

## 2012-05-05 DIAGNOSIS — K5730 Diverticulosis of large intestine without perforation or abscess without bleeding: Secondary | ICD-10-CM | POA: Insufficient documentation

## 2012-05-05 DIAGNOSIS — E785 Hyperlipidemia, unspecified: Secondary | ICD-10-CM | POA: Insufficient documentation

## 2012-05-05 DIAGNOSIS — K648 Other hemorrhoids: Secondary | ICD-10-CM | POA: Insufficient documentation

## 2012-05-05 DIAGNOSIS — N400 Enlarged prostate without lower urinary tract symptoms: Secondary | ICD-10-CM | POA: Insufficient documentation

## 2012-05-05 DIAGNOSIS — K219 Gastro-esophageal reflux disease without esophagitis: Secondary | ICD-10-CM

## 2012-05-05 DIAGNOSIS — A048 Other specified bacterial intestinal infections: Secondary | ICD-10-CM | POA: Insufficient documentation

## 2012-05-05 DIAGNOSIS — I10 Essential (primary) hypertension: Secondary | ICD-10-CM | POA: Insufficient documentation

## 2012-05-05 HISTORY — PX: FLEXIBLE SIGMOIDOSCOPY: SHX5431

## 2012-05-05 HISTORY — PX: ESOPHAGOGASTRODUODENOSCOPY: SHX1529

## 2012-05-05 LAB — KOH PREP

## 2012-05-05 SURGERY — COLONOSCOPY WITH ESOPHAGOGASTRODUODENOSCOPY (EGD)
Anesthesia: Moderate Sedation

## 2012-05-05 MED ORDER — MEPERIDINE HCL 100 MG/ML IJ SOLN
INTRAMUSCULAR | Status: AC
  Filled 2012-05-05: qty 2

## 2012-05-05 MED ORDER — PANTOPRAZOLE SODIUM 40 MG PO TBEC: DELAYED_RELEASE_TABLET | ORAL | Status: DC

## 2012-05-05 MED ORDER — MIDAZOLAM HCL 5 MG/5ML IJ SOLN
INTRAMUSCULAR | Status: AC
  Filled 2012-05-05: qty 10

## 2012-05-05 MED ORDER — DEXTROSE IN LACTATED RINGERS 5 % IV SOLN
INTRAVENOUS | Status: DC
  Administered 2012-05-05: 08:00:00 via INTRAVENOUS

## 2012-05-05 MED ORDER — MEPERIDINE HCL 100 MG/ML IJ SOLN
INTRAMUSCULAR | Status: DC | PRN
  Administered 2012-05-05 (×3): 25 mg via INTRAVENOUS

## 2012-05-05 MED ORDER — MIDAZOLAM HCL 5 MG/5ML IJ SOLN
INTRAMUSCULAR | Status: DC | PRN
  Administered 2012-05-05 (×3): 1 mg via INTRAVENOUS
  Administered 2012-05-05: 2 mg via INTRAVENOUS

## 2012-05-05 MED ORDER — STERILE WATER FOR IRRIGATION IR SOLN
Status: DC | PRN
  Administered 2012-05-05: 09:00:00
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
## 2012-05-05 NOTE — Op Note (Signed)
[facility] [address] [place], [place]  [address]  FLEXIBLE SIGMOIDOSCOPY PROCEDURE REPORT  PATIENT:  Dale, [name]  MR#:  [id number] BIRTHDATE:  01/13/1933, 78 yrs. old  GENDER:  male  ENDOSCOPIST:  Jonette Eva, MD Referred by:  Rudi Heap, M.D.  PROCEDURE DATE:  05/05/2012 PROCEDURE:  Flexible Sigmoidoscopy, diagnostic ASA CLASS: INDICATIONS:  screening  MEDICATIONS:   Demerol 75 mg IV, Versed 4 mg IV  DESCRIPTION OF PROCEDURE:   After the risks benefits and alternatives of the procedure were thoroughly explained, informed consent was obtained.  Digital rectal exam was performed and revealed no abnormalities.   The [id number] ([id number]) endoscope was introduced through the anus and advanced to the descending colon, without limitations.  The quality of the prep was adequate.  The instrument was then slowly withdrawn as the mucosa was fully examined. <<PROCEDUREIMAGES>>  FREQUENT Diverticula were found in the sigmoid & DESCENDING colon .  MODERATE Internal hemorrhoids were found.   Retroflexed views in the rectum revealed medium hemorrhoids.    The scope was then withdrawn from the patient and the procedure terminated. COMPLICATIONS:  TCS CHANGED TO FLEX SIG DUE TO POOR BOWEL PREP.  ENDOSCOPIC IMPRESSION: 1) Medium hemorrhoids 2) SEVERE LEFT COLON DIVERTICULOSIS  RECOMMENDATIONS: CONSIDER COMPLETE TCS BECAUSE PT HAS NEVER HAD A FULL COLON EVALUATION OPV IN 2 MOS TO DISCUSS  REPEAT EXAM:  No  ______________________________ Jonette Eva, MD  CC:  n. eSIGNED:   Travis Purk at 05/05/2012 10:05 AM  [place], Chrissie Noa [phone]

## 2012-05-05 NOTE — Telephone Encounter (Signed)
Message copied by Irish Elders on Tue May 05, 2012 11:19 AM ------      Message from: [place]      Created: Tue May 05, 2012 10:07 AM       REFER PT TO DR. Suszanne Conners FOR HOARSENESS-FAX EGD REPORT TO HIS OFFICE

## 2012-05-05 NOTE — Telephone Encounter (Signed)
Message copied by Irish Elders on Tue May 05, 2012 11:13 AM ------      Message from: [place]      Created: Tue May 05, 2012 10:07 AM       REFER PT TO DR. Suszanne Conners FOR HOARSENESS-FAX EGD REPORT TO HIS OFFICE

## 2012-05-05 NOTE — Telephone Encounter (Signed)
REVIEWED.  

## 2012-05-05 NOTE — Telephone Encounter (Signed)
Pt is scheduled to see Dr Suszanne Conners on 06/13 @ 2pm- this will be in the [place] office- left message on pt's VM - notes faxed to [phone]

## 2012-05-05 NOTE — Discharge Instructions (Signed)
YOUR BOWEL PREP WAS NOT GOD ENOUGH TO PERFORM A COLONOSCOPY. YOU HAD A SIGMOIDOSCOPY. YOU DID NOT HAVE ANY POLYPS. You have gastritisDUE TO ASPIRIN USE. You had a nodule in your STOMACH THAT I REMOVED. I biopsied your stomach.  INCREASE PROTONIX TO TWICE DAILY.  AVOID ITEMS THAT TRIGGER GASTRITIS. SEE INFO BELOW. FOLLOW A HIGH FIBER/LOW FAT DIET. AVOID ITEMS THAT CAUSE BLOATING. SEE INFO BELOW. YOUR BIOPSY RESULTS WILL BE AVAILABLE IN 7 DAYS. YOU SHOULD SEE AN EAR, NOSE AND THROAT DOCTOR FOR YOUR HOARSENESS. FOLLOW UP IN 2 MOS.    ENDOSCOPY Care After Read the instructions outlined below and refer to this sheet in the next week. These discharge instructions provide you with general information on caring for yourself after you leave the hospital. While your treatment has been planned according to the most current medical practices available, unavoidable complications occasionally occur. If you have any problems or questions after discharge, call DR. Amere Iott, [phone].  ACTIVITY  You may resume your regular activity, but move at a slower pace for the next 24 hours.   Take frequent rest periods for the next 24 hours.   Walking will help get rid of the air and reduce the bloated feeling in your belly (abdomen).   No driving for 24 hours (because of the medicine (anesthesia) used during the test).   You may shower.   Do not sign any important legal documents or operate any machinery for 24 hours (because of the anesthesia used during the test).    NUTRITION  Drink plenty of fluids.   You may resume your normal diet as instructed by your doctor.   Begin with a light meal and progress to your normal diet. Heavy or fried foods are harder to digest and may make you feel sick to your stomach (nauseated).   Avoid alcoholic beverages for 24 hours or as instructed.    MEDICATIONS  You may resume your normal medications.   WHAT YOU CAN EXPECT TODAY  Some feelings of bloating in  the abdomen.   Passage of more gas than usual.   Spotting of blood in your stool or on the toilet paper  .  IF YOU HAD POLYPS REMOVED DURING THE ENDOSCOPY:  Eat a soft diet IF YOU HAVE NAUSEA, BLOATING, ABDOMINAL PAIN, OR VOMITING.    FINDING OUT THE RESULTS OF YOUR TEST Not all test results are available during your visit. DR. Darrick Penna WILL CALL YOU WITHIN 7 DAYS OF YOUR PROCEDUE WITH YOUR RESULTS. Do not assume everything is normal if you have not heard from DR. Kazuto Sevey IN ONE WEEK, CALL HER OFFICE AT [phone].  SEEK IMMEDIATE MEDICAL ATTENTION AND CALL THE OFFICE: [phone] IF:  You have more than a spotting of blood in your stool.   Your belly is swollen (abdominal distention).   You are nauseated or vomiting.   You have a temperature over 101F.   You have abdominal pain or discomfort that is severe or gets worse throughout the day.   Gastritis  Gastritis is an inflammation (the body's way of reacting to injury and/or infection) of the stomach. It is often caused by viral or bacterial (germ) infections. It can also be caused BY ASPIRIN, BC/GOODY POWDER'S, (IBUPROFEN) MOTRIN, OR ALEVE (NAPROXEN), chemicals (including alcohol), SPICY FOODS, and medications. This illness may be associated with generalized malaise (feeling tired, not well), UPPER ABDOMINAL STOMACH cramps, and fever. One common bacterial cause of gastritis is an organism known as H. Pylori. This can be treated  with antibiotics.    High-Fiber Diet A high-fiber diet changes your normal diet to include more whole grains, legumes, fruits, and vegetables. Changes in the diet involve replacing refined carbohydrates with unrefined foods. The calorie level of the diet is essentially unchanged. The Dietary Reference Intake (recommended amount) for adult males is 38 grams per day. For adult females, it is 25 grams per day. Pregnant and lactating women should consume 28 grams of fiber per day. Fiber is the intact part of a  plant that is not broken down during digestion. Functional fiber is fiber that has been isolated from the plant to provide a beneficial effect in the body. PURPOSE  Increase stool bulk.   Ease and regulate bowel movements.   Lower cholesterol.  INDICATIONS THAT YOU NEED MORE FIBER  Constipation and hemorrhoids.   Uncomplicated diverticulosis (intestine condition) and irritable bowel syndrome.   Weight management.   As a protective measure against hardening of the arteries (atherosclerosis), diabetes, and cancer.   GUIDELINES FOR INCREASING FIBER IN THE DIET  Start adding fiber to the diet slowly. A gradual increase of about 5 more grams (2 slices of whole-wheat bread, 2 servings of most fruits or vegetables, or 1 bowl of high-fiber cereal) per day is best. Too rapid an increase in fiber may result in constipation, flatulence, and bloating.   Drink enough water and fluids to keep your urine clear or pale yellow. Water, juice, or caffeine-free drinks are recommended. Not drinking enough fluid may cause constipation.   Eat a variety of high-fiber foods rather than one type of fiber.   Try to increase your intake of fiber through using high-fiber foods rather than fiber pills or supplements that contain small amounts of fiber.   The goal is to change the types of food eaten. Do not supplement your present diet with high-fiber foods, but replace foods in your present diet.  INCLUDE A VARIETY OF FIBER SOURCES  Replace refined and processed grains with whole grains, canned fruits with fresh fruits, and incorporate other fiber sources. White rice, white breads, and most bakery goods contain little or no fiber.   Brown whole-grain rice, buckwheat oats, and many fruits and vegetables are all good sources of fiber. These include: broccoli, Brussels sprouts, cabbage, cauliflower, beets, sweet potatoes, white potatoes (skin on), carrots, tomatoes, eggplant, squash, berries, fresh fruits, and  dried fruits.   Cereals appear to be the richest source of fiber. Cereal fiber is found in whole grains and bran. Bran is the fiber-rich outer coat of cereal grain, which is largely removed in refining. In whole-grain cereals, the bran remains. In breakfast cereals, the largest amount of fiber is found in those with "bran" in their names. The fiber content is sometimes indicated on the label.   You may need to include additional fruits and vegetables each day.   In baking, for 1 cup white flour, you may use the following substitutions:   1 cup whole-wheat flour minus 2 tablespoons.   1/2 cup white flour plus 1/2 cup whole-wheat flour.   Low-Fat Diet BREADS, CEREALS, PASTA, RICE, DRIED PEAS, AND BEANS These products are high in carbohydrates and most are low in fat. Therefore, they can be increased in the diet as substitutes for fatty foods. They too, however, contain calories and should not be eaten in excess. Cereals can be eaten for snacks as well as for breakfast.  Include foods that contain fiber (fruits, vegetables, whole grains, and legumes). Research shows that fiber may lower  blood cholesterol levels, especially the water-soluble fiber found in fruits, vegetables, oat products, and legumes. FRUITS AND VEGETABLES It is good to eat fruits and vegetables. Besides being sources of fiber, both are rich in vitamins and some minerals. They help you get the daily allowances of these nutrients. Fruits and vegetables can be used for snacks and desserts. MEATS Limit lean meat, chicken, [place], and fish to no more than 6 ounces per day. Beef, Pork, and Lamb Use lean cuts of beef, pork, and lamb. Lean cuts include:  Extra-lean ground beef.  Arm roast.  Sirloin tip.  Center-cut ham.  Round steak.  Loin chops.  Rump roast.  Tenderloin.  Trim all fat off the outside of meats before cooking. It is not necessary to severely decrease the intake of red meat, but lean choices should be made. Lean  meat is rich in protein and contains a highly absorbable form of iron. Premenopausal women, in particular, should avoid reducing lean red meat because this could increase the risk for low red blood cells (iron-deficiency anemia). The organ meats, such as liver, sweetbreads, kidneys, and brain are very rich in cholesterol. They should be limited. Chicken and [place] These are good sources of protein. The fat of poultry can be reduced by removing the skin and underlying fat layers before cooking. Chicken and [place] can be substituted for lean red meat in the diet. Poultry should not be fried or covered with high-fat sauces. Fish and Shellfish Fish is a good source of protein. Shellfish contain cholesterol, but they usually are low in saturated fatty acids. The preparation of fish is important. Like chicken and [place], they should not be fried or covered with high-fat sauces. EGGS Egg whites contain no fat or cholesterol. They can be eaten often. Try 1 to 2 egg whites instead of whole eggs in recipes or use egg substitutes that do not contain yolk. MILK AND DAIRY PRODUCTS Use skim or 1% milk instead of 2% or whole milk. Decrease whole milk, natural, and processed cheeses. Use nonfat or low-fat (2%) cottage cheese or low-fat cheeses made from vegetable oils. Choose nonfat or low-fat (1 to 2%) yogurt. Experiment with evaporated skim milk in recipes that call for heavy cream. Substitute low-fat yogurt or low-fat cottage cheese for sour cream in dips and salad dressings. Have at least 2 servings of low-fat dairy products, such as 2 glasses of skim (or 1%) milk each day to help get your daily calcium intake.  FATS AND OILS Reduce the total intake of fats, especially saturated fat. Butterfat, lard, and beef fats are high in saturated fat and cholesterol. These should be avoided as much as possible. Vegetable fats do not contain cholesterol, but certain vegetable fats, such as coconut oil, palm oil, and palm kernel  oil are very high in saturated fats. These should be limited. These fats are often used in bakery goods, processed foods, popcorn, oils, and nondairy creamers. Vegetable shortenings and some peanut butters contain hydrogenated oils, which are also saturated fats. Read the labels on these foods and check for saturated vegetable oils. Unsaturated vegetable oils and fats do not raise blood cholesterol. However, they should be limited because they are fats and are high in calories. Total fat should still be limited to 30% of your daily caloric intake. Desirable liquid vegetable oils are corn oil, cottonseed oil, olive oil, canola oil, safflower oil, soybean oil, and sunflower oil. Peanut oil is not as good, but small amounts are acceptable. Buy a heart-healthy tub  margarine that has no partially hydrogenated oils in the ingredients. Mayonnaise and salad dressings often are made from unsaturated fats, but they should also be limited because of their high calorie and fat content. Seeds, nuts, peanut butter, olives, and avocados are high in fat, but the fat is mainly the unsaturated type. These foods should be limited mainly to avoid excess calories and fat. OTHER EATING TIPS Snacks  Most sweets should be limited as snacks. They tend to be rich in calories and fats, and their caloric content outweighs their nutritional value. Some good choices in snacks are graham crackers, melba toast, soda crackers, bagels (no egg), English muffins, fruits, and vegetables. These snacks are preferable to snack crackers, [place] fries, and chips. Popcorn should be air-popped or cooked in small amounts of liquid vegetable oil. Desserts Eat fruit, low-fat yogurt, and fruit ices. AVOID pastries, cake, and cookies. Sherbet, angel food cake, gelatin dessert, frozen low-fat yogurt, or other frozen products that do not contain saturated fat (pure fruit juice bars, frozen ice pops) are also acceptable.  COOKING METHODS Choose those methods  that use little or no fat. They include: Poaching.  Braising.  Steaming.  Grilling.  Baking.  Stir-frying.  Broiling.  Microwaving.  Foods can be cooked in a nonstick pan without added fat, or use a nonfat cooking spray in regular cookware. Limit fried foods and avoid frying in saturated fat. Add moisture to lean meats by using water, broth, cooking wines, and other nonfat or low-fat sauces along with the cooking methods mentioned above. Soups and stews should be chilled after cooking. The fat that forms on top after a few hours in the refrigerator should be skimmed off. When preparing meals, avoid using excess salt. Salt can contribute to raising blood pressure in some people. EATING AWAY FROM HOME Order entres, potatoes, and vegetables without sauces or butter. When meat exceeds the size of a deck of cards (3 to 4 ounces), the rest can be taken home for another meal. Choose vegetable or fruit salads and ask for low-calorie salad dressings to be served on the side. Use dressings sparingly. Limit high-fat toppings, such as bacon, crumbled eggs, cheese, sunflower seeds, and olives. Ask for heart-healthy tub margarine instead of butter.   Diverticulosis Diverticulosis is a common condition that develops when small pouches (diverticula) form in the wall of the colon. The risk of diverticulosis increases with age. It happens more often in people who eat a low-fiber diet. Most individuals with diverticulosis have no symptoms. Those individuals with symptoms usually experience belly (abdominal) pain, constipation, or loose stools (diarrhea).  HOME CARE INSTRUCTIONS  Increase the amount of fiber in your diet as directed by your caregiver or dietician. This may reduce symptoms of diverticulosis.   Drink at least 6 to 8 glasses of water each day to prevent constipation.   Try not to strain when you have a bowel movement.   Avoiding nuts and seeds to prevent complications is still an uncertain  benefit.       FOODS HAVING HIGH FIBER CONTENT INCLUDE:  Fruits. Apple, peach, pear, tangerine, raisins, prunes.   Vegetables. Brussels sprouts, asparagus, broccoli, cabbage, carrot, cauliflower, romaine lettuce, spinach, summer squash, tomato, winter squash, zucchini.   Starchy Vegetables. Baked beans, kidney beans, lima beans, split peas, lentils, potatoes (with skin).   Grains. Whole wheat bread, brown rice, bran flake cereal, plain oatmeal, white rice, shredded wheat, bran muffins.    SEEK IMMEDIATE MEDICAL CARE IF:  You develop increasing pain or  severe bloating.   You have an oral temperature above 101F.   You develop vomiting or bowel movements that are bloody or black.    Hemorrhoids Hemorrhoids are dilated (enlarged) veins around the rectum. Sometimes clots will form in the veins. This makes them swollen and painful. These are called thrombosed hemorrhoids. Causes of hemorrhoids include:  Constipation.   Straining to have a bowel movement.   HEAVY LIFTING HOME CARE INSTRUCTIONS  Eat a well balanced diet and drink 6 to 8 glasses of water every day to avoid constipation. You may also use a bulk laxative.   Avoid straining to have bowel movements.   Keep anal area dry and clean.   Do not use a donut shaped pillow or sit on the toilet for long periods. This increases blood pooling and pain.   Move your bowels when your body has the urge; this will require less straining and will decrease pain and pressure.
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
## 2012-05-08 ENCOUNTER — Encounter: Payer: Self-pay | Admitting: Cardiology

## 2012-05-12 ENCOUNTER — Telehealth: Payer: Self-pay

## 2012-05-12 ENCOUNTER — Encounter: Payer: Self-pay | Admitting: Physician Assistant

## 2012-05-12 NOTE — Telephone Encounter (Signed)
Pt called and requested report on biopsies from Egd.

## 2012-05-13 ENCOUNTER — Ambulatory Visit (INDEPENDENT_AMBULATORY_CARE_PROVIDER_SITE_OTHER): Payer: No Typology Code available for payment source | Admitting: Gastroenterology

## 2012-05-13 ENCOUNTER — Encounter: Payer: Self-pay | Admitting: Gastroenterology

## 2012-05-13 VITALS — BP 124/61 | HR 88 | Temp 98.1°F | Ht 71.0 in | Wt 237.8 lb

## 2012-05-13 DIAGNOSIS — B3781 Candidal esophagitis: Secondary | ICD-10-CM

## 2012-05-13 DIAGNOSIS — R1310 Dysphagia, unspecified: Secondary | ICD-10-CM

## 2012-05-13 DIAGNOSIS — A048 Other specified bacterial intestinal infections: Secondary | ICD-10-CM

## 2012-05-13 DIAGNOSIS — K2970 Gastritis, unspecified, without bleeding: Secondary | ICD-10-CM

## 2012-05-13 DIAGNOSIS — B9681 Helicobacter pylori [H. pylori] as the cause of diseases classified elsewhere: Secondary | ICD-10-CM

## 2012-05-13 MED ORDER — AMOXICILLIN 500 MG PO CAPS: ORAL_CAPSULE | ORAL | Status: AC

## 2012-05-13 MED ORDER — FLUCONAZOLE 100 MG PO TABS: ORAL_TABLET | ORAL | Status: AC

## 2012-05-13 MED ORDER — CLARITHROMYCIN 500 MG PO TABS: ORAL_TABLET | ORAL | Status: AC
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
## 2012-05-13 NOTE — Patient Instructions (Addendum)
YOU NEED DIFLUCAN FOR 21 DAYS.   STOP TAKING LIPITOR. RESTART June 27.   START YOUR TREATMENT FOR  H PYLORI GASTRITIS ON June 6.   TAKE CELEXA EVERY OTHER DAY. RESTART TAKING IT DAILY ON June 27.  FOLLOW UP IN July.

## 2012-05-13 NOTE — Telephone Encounter (Signed)
PT CALLED. EXPLAINED RESULTS. PT CONFUSED ABOUT MEDS. OPV TODAY AT 1130 MAY 15.

## 2012-05-13 NOTE — Telephone Encounter (Signed)
Results Cc to PCP  

## 2012-05-13 NOTE — Telephone Encounter (Signed)
CALLED PT. LVM-CALL [phone] TO DISCUSS.   HE HAS CANDIDA ESOPHAGITIS, WHICH MAY CAUSE PROBLEMS SWALLOWING. IT IS MOST LIKELY DUE TO HIS HAVING DIABETES. HE NEEDS DIFLUCAN FOR 21 DAYS. DISCUSSED DRUG INTERACTIONS WITH PHARMACY([facility])-LIPITOR SHOULD BE HELD DUE TO POTENTIAL FOR RHABDO. QT PROLONGATION CAN OCCUR WITH CELEXA.   AFTER TREATMENT FOR CANDIDA IS COMPLETE, PT NEEDS RX FOR H PYLORI GASTRITIS. NEEDS ABP BID FOR 10 DAYS. WILL ASK PT TO TAKE CELEXA QOD WHILE TAKING DIFLUCAN AND ABP. LIPITOR SHOULD BE HELD UNTIL BOTH THERAPIES ARE COMPLETE.  OPV JUL 2013.

## 2012-05-14 DIAGNOSIS — B9681 Helicobacter pylori [H. pylori] as the cause of diseases classified elsewhere: Secondary | ICD-10-CM | POA: Insufficient documentation

## 2012-05-14 DIAGNOSIS — B3781 Candidal esophagitis: Secondary | ICD-10-CM | POA: Insufficient documentation

## 2012-05-14 NOTE — Progress Notes (Signed)
Faxed to PCP

## 2012-05-14 NOTE — Progress Notes (Signed)
Called pt, per Dr. Darrick Penna, and asked him to pick up a new AVS. She said his one yesterday had a typo error. He should take his Celexa every other day. I informed him and he said he would be by tomorrow to pick up the AVS. He is aware that he should discard previous copy.

## 2012-05-14 NOTE — Assessment & Plan Note (Signed)
START YOUR TREATMENT FOR  H PYLORI GASTRITIS: AMOX/BXN ON June 6.  PROTONIX BID-BOTTLE MARKED.   STOP TAKING LIPITOR. PILL BOTTLE MARKED. RESTART June 27.   TAKE CELEXA EVERY OTHER DAY-PILL BOTTLE MARKED. RESTART TAKING IT DAILY ON June 27.  FOLLOW UP IN July.

## 2012-05-14 NOTE — Assessment & Plan Note (Addendum)
LIKELY DUE TO DM-DIFLUCAN FOR 21 DAYS.   STOP TAKING LIPITOR. BOTTLE MARKED. RESTART June 27.  START YOUR TREATMENT FOR  H PYLORI GASTRITIS ON June 6.   TAKE CELEXA EVERY OTHER DAY-BOTTLE MARKED. RESTART TAKING IT DAILY ON June 27.  FOLLOW UP IN July.

## 2012-05-14 NOTE — Assessment & Plan Note (Signed)
PT STATES HE NEVER HAD TROUBLE SWALLOWING.

## 2012-05-14 NOTE — Progress Notes (Signed)
Pt is aware of ov on 7/10 at 9 with SF in E30

## 2012-05-18 ENCOUNTER — Encounter (HOSPITAL_COMMUNITY): Payer: Self-pay | Admitting: Gastroenterology

## 2012-05-26 ENCOUNTER — Telehealth: Payer: Self-pay

## 2012-05-26 NOTE — Telephone Encounter (Signed)
Pt came by the office with bottles of meds with questions. He had his AVS that Dr. Darrick Penna had given him. But he is still confused about his meds. He had his Diflucan 100 mg  #22 tablets and was filled on 05/13/2012. He was supposed to take two tablets the first day and then one tablet daily. The bottle was empty. I told him it looked like he has taken too many and he said "no, I took exactly as prescribed".    The H. Pylori meds, Amoxicillin and Biaxin was to be started on June 6th. He had the Amoxicillin, which was filled on 05/13/2012 and he only has 16 pills left of that. He did not have the bottle of Biaxin, but I called the pharmacist, Santina Evans, at [facility] [place] and she said it was filled on 05/13/2012 also. I wrote the name down on his AVS , including generic name and asked him to call me if he has that bottle at home.   Per Lorenza Burton, NP just have pt stop the Amoxicillin for now and find out if possible about the Biaxin and see what Dr. Darrick Penna would like to do when she returns on Wed 05/27/2012. ( I told the pt and taped the bottle of the Amoxicillin).   I also called poison control at [phone] and spoke with [facility]. I explained about the Diflucan and she said that normally it would cause GI symptoms first, such as Nausea, vomiting and diarrhea and abdominal pain. And she said it is nothing really to worry about if pt is not symptomatic. Pt stated that he has not had any nausea, vomiting or diarrhea that he knows his body and he has definitely not had any of that or abdominal pain.   Pt said he will bring everybody of medicine by the office and let me check tomorrow morning, whether he is taking it or not. He will bring everything he has at home. He is aware that Dr. Darrick Penna will address the antibiotics when she returns on 05/27/2012.   I highly encouraged the pt to contact [facility] about having his meds packaged and it would be much easier for him. He was very much in favor of that.

## 2012-05-27 ENCOUNTER — Other Ambulatory Visit: Payer: Self-pay | Admitting: Gastroenterology

## 2012-05-27 NOTE — Telephone Encounter (Signed)
Per Dr. Darrick Penna, find out if pt has any close relative/ friend who would be able to assist with his medications. I called [phone] home and VM was full, could not leave message.  Called [phone] mobile, it was full and could not leave a message.

## 2012-05-27 NOTE — Telephone Encounter (Signed)
Pt brought his bottles of medicine by the office today. He did have the empty bottle of the Clarithromycin which was filled on 05/13/2012. I put that bottle and the bottle of Amoxicillin with 16 left and his Diflucan bottle in a different little bag for him. I told him I will discuss with Dr. Darrick Penna and get back with him. The following meds that he brought at updated in the medications list.

## 2012-05-27 NOTE — Telephone Encounter (Signed)
Forwarded to [facility] as she has been involved with patient regarding recent medication issues.   [name], please let me know if you need refills for either one of these.

## 2012-05-27 NOTE — Telephone Encounter (Signed)
REVIEWED.  WE HAVE SPENT 1 HOUR ON 2 DIFFERENT OCCASIONS TO TRY TO GET PT TO UNDERSTAND HOW HE SHOULD TAKE HIS MEDICINE. HE WAS ALSO GIVEN HIS MEDICATION INSTRUCTIONS IN WRITING. NO OTHER OPTION FOR MEDICATION ADHERENCE EXCEPT [facility] OR A FAMILY MEMBER ASSISTING WITH MEDS.

## 2012-05-28 ENCOUNTER — Telehealth: Payer: Self-pay

## 2012-05-28 NOTE — Telephone Encounter (Signed)
I called home and left message for a return call. I called mobile, could not leave a message. ( See Dr. [name] note on phone call of 05/26/2012). Must have [facility] or family member to assist with meds per Dr. Darrick Penna.

## 2012-05-28 NOTE — Telephone Encounter (Signed)
REVIEWED.  

## 2012-05-28 NOTE — Telephone Encounter (Signed)
Cc to Dr. Christell Constant

## 2012-05-28 NOTE — Telephone Encounter (Signed)
Pt was returning nurse's call. You can call him back at [phone]

## 2012-05-28 NOTE — Telephone Encounter (Signed)
Pt called again. I asked him did he have a family member ( he has a sister listed for emergency ) who could help him with his meds. He said  "we do not get along in my family and I do not need any help, I am a wise man". I told him Dr. Darrick Penna is concerned because she spent a lot of time with him on his meds and marked them and had all instructions written down and the Biaxin bottle was empty now ( before he was even to start it) and also the Amoxicillin was almost gone. I told him she wanted him to have a family member or [facility] to over see the meds. I gave him the phone number of [facility] and also told him the location. He said he might call them if he gets a chance. I asked him to let me know as soon as he can and he said he would if he talks to them.

## 2012-06-01 ENCOUNTER — Telehealth: Payer: Self-pay

## 2012-06-01 NOTE — Telephone Encounter (Signed)
PT NEEDS TO [facility] AN APPT E 30 VISIT AND BRING HIS SISTER SO I CAN EXPLAIN HIS TREATMENT FOR H PYLORI AGAIN.

## 2012-06-01 NOTE — Telephone Encounter (Signed)
Pt brought his sister, Ernst Breach. Herbin by the office and she said she will be glad to assist pt with meds. I told her you might have her come ine with him at an OV and she said to let her know in time to plan. He is wanting to know when he can get treatment for the H. Pylori??

## 2012-06-02 ENCOUNTER — Telehealth: Payer: Self-pay | Admitting: Gastroenterology

## 2012-06-02 NOTE — Telephone Encounter (Signed)
Pt called- he would like for DS to call him at [phone]

## 2012-06-02 NOTE — Telephone Encounter (Signed)
Pt is aware and Darl Pikes will schedule.

## 2012-06-02 NOTE — Telephone Encounter (Signed)
Called pt and informed that Dr. Darrick Penna wants him to schedule appt and bring his sister with him. He is aware that [name] will call and schedule.

## 2012-06-02 NOTE — Telephone Encounter (Signed)
LMOM for pt to call. 

## 2012-06-02 NOTE — Telephone Encounter (Signed)
Pt is aware of OV on 7/10 at 0900 with SF in E30 and was told to bring his sister with him to appointment.

## 2012-06-07 ENCOUNTER — Ambulatory Visit: Payer: No Typology Code available for payment source | Attending: Neurology | Admitting: Sleep Medicine

## 2012-06-07 DIAGNOSIS — R0609 Other forms of dyspnea: Secondary | ICD-10-CM | POA: Insufficient documentation

## 2012-06-07 DIAGNOSIS — R0989 Other specified symptoms and signs involving the circulatory and respiratory systems: Secondary | ICD-10-CM | POA: Insufficient documentation

## 2012-06-07 DIAGNOSIS — R0681 Apnea, not elsewhere classified: Secondary | ICD-10-CM | POA: Insufficient documentation

## 2012-06-07 DIAGNOSIS — R5383 Other fatigue: Secondary | ICD-10-CM | POA: Insufficient documentation

## 2012-06-07 DIAGNOSIS — R5381 Other malaise: Secondary | ICD-10-CM | POA: Insufficient documentation

## 2012-06-07 DIAGNOSIS — G4710 Hypersomnia, unspecified: Secondary | ICD-10-CM | POA: Insufficient documentation

## 2012-06-07 DIAGNOSIS — G4730 Sleep apnea, unspecified: Secondary | ICD-10-CM

## 2012-06-07 DIAGNOSIS — G4733 Obstructive sleep apnea (adult) (pediatric): Secondary | ICD-10-CM | POA: Insufficient documentation

## 2012-06-11 ENCOUNTER — Ambulatory Visit (INDEPENDENT_AMBULATORY_CARE_PROVIDER_SITE_OTHER): Payer: No Typology Code available for payment source | Admitting: Otolaryngology

## 2012-06-11 DIAGNOSIS — R490 Dysphonia: Secondary | ICD-10-CM

## 2012-06-13 NOTE — Procedures (Signed)
NAME:  [name], [name] NO.:  [id number]  MEDICAL RECORD NO.:  [id number]          PATIENT TYPE:  OUT  LOCATION:  SLEEP LAB                     FACILITY:  [facility]  PHYSICIAN:  Danyel Griess A. Gerilyn Pilgrim, M.D. DATE OF BIRTH:  01-06-1933  DATE OF STUDY:  06/07/2012                           NOCTURNAL POLYSOMNOGRAM  REFERRING PHYSICIAN:  Elina Streng A. Gerilyn Pilgrim, M.D.  INDICATION:  A 78 year old man who presents with fatigue, snoring, witnessed apnea, and hypersomnia.  The study is being done to evaluate for obstructive sleep apnea syndrome.  MEDICATIONS:  None.  EPWORTH SLEEPINESS SCALE:  7.  BMI 31.  ARCHITECTURAL SUMMARY:  The total recording time is 389 minutes, sleep efficiency 71%, sleep latency 30 minutes, REM latency 185 minutes. Stage N1 19.8%, N2 61.7%, N3 0%, and REM sleep 18.5%.  RESPIRATORY SUMMARY:  Baseline oxygen saturation 93, lowest saturation 82 during REM sleep.  Diagnostic AHI is 9.1 and RDI 9.4.  The patient had more events during the REM sleep with a REM index 37.6.  LIMB MOVEMENT SUMMARY:  PLM index 0.  ELECTROCARDIOGRAM SUMMARY:  Average heart rate is 68 with no significant dysrhythmias observed.  IMPRESSION:  Mild obstructive sleep apnea syndrome, worse during REM sleep.   Teondra Newburg A. Gerilyn Pilgrim, M.D.    KAD/MEDQ  D:  06/13/2012 21:36:50  T:  06/13/2012 21:50:35  Job:  [id number]

## 2012-07-08 ENCOUNTER — Ambulatory Visit (INDEPENDENT_AMBULATORY_CARE_PROVIDER_SITE_OTHER): Payer: No Typology Code available for payment source | Admitting: Gastroenterology

## 2012-07-08 ENCOUNTER — Encounter: Payer: Self-pay | Admitting: Gastroenterology

## 2012-07-08 VITALS — BP 123/65 | HR 75 | Temp 98.2°F | Ht 71.0 in | Wt 239.6 lb

## 2012-07-08 DIAGNOSIS — B9681 Helicobacter pylori [H. pylori] as the cause of diseases classified elsewhere: Secondary | ICD-10-CM

## 2012-07-08 DIAGNOSIS — K219 Gastro-esophageal reflux disease without esophagitis: Secondary | ICD-10-CM

## 2012-07-08 DIAGNOSIS — A048 Other specified bacterial intestinal infections: Secondary | ICD-10-CM
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
## 2012-07-08 NOTE — Progress Notes (Signed)
Forward to [employer]

## 2012-07-08 NOTE — Progress Notes (Signed)
Forward to Dawn 

## 2012-07-08 NOTE — Assessment & Plan Note (Signed)
COMPLETED ABX.  OPV IN 6 MOS. WILL NEED BREATH TEST TO CONFIRM CURE.

## 2012-07-08 NOTE — Assessment & Plan Note (Signed)
CONTROLLED ON PROTONIX DAILY. [facility]ED [facility] TO [facility].  OPV IN 6 MOS.

## 2012-07-08 NOTE — Patient Instructions (Signed)
TAKE  PROTONIX (pantoprazole) 30 minutes prior to your first meal every day.   FOLLOW UP IN 6 MOS.

## 2012-07-09 ENCOUNTER — Telehealth: Payer: Self-pay

## 2012-07-09 NOTE — Progress Notes (Signed)
Reminder in epic to follow up in 6 months °

## 2012-07-09 NOTE — Telephone Encounter (Signed)
REVIEWED. AGREE. 

## 2012-07-09 NOTE — Telephone Encounter (Signed)
T/C from Jan, nurse at [facility]. Pt is there and he has a bottle with Nexium and someone has written stop on it. He also said he is supposed to be taking Protonix. She wanted to confirm. Reviewed the chart. Informed her that [facility] [place] [facility] for Protonix bid and has 11 refills from Dr. Darrick Penna.

## 2012-07-13 NOTE — Progress Notes (Signed)
Faxed to PCP

## 2012-07-14 ENCOUNTER — Other Ambulatory Visit: Payer: Self-pay | Admitting: Cardiology

## 2012-07-18 ENCOUNTER — Other Ambulatory Visit: Payer: Self-pay | Admitting: Cardiology

## 2012-08-05 ENCOUNTER — Telehealth: Payer: Self-pay | Admitting: *Deleted

## 2012-08-05 NOTE — Telephone Encounter (Signed)
Called pt. He said he is not having any difficulty swallowing. He just called to let us know that he has seen Dr. Danice Goltz and had x-rays done. Was told that he will probably be referred to [facility] because he continues to have hoarseness. He will call back when he knows more. He just wanted to [facility] Dr. Darrick Penna.

## 2012-08-05 NOTE — Telephone Encounter (Signed)
Mr [name] called today. He is still having trouble with his throat. Please call him back. Thanks.

## 2012-08-06 NOTE — Telephone Encounter (Signed)
REVIEWED.  

## 2012-09-21 DIAGNOSIS — J3802 Paralysis of vocal cords and larynx, bilateral: Secondary | ICD-10-CM | POA: Insufficient documentation

## 2012-12-11 ENCOUNTER — Encounter: Payer: Self-pay | Admitting: *Deleted

## 2012-12-17 ENCOUNTER — Ambulatory Visit: Payer: No Typology Code available for payment source | Admitting: Gastroenterology

## 2013-03-16 ENCOUNTER — Telehealth: Payer: Self-pay | Admitting: Pharmacist

## 2013-03-16 ENCOUNTER — Other Ambulatory Visit: Payer: Self-pay | Admitting: Pharmacist

## 2013-03-16 MED ORDER — INSULIN GLULISINE 100 UNIT/ML ~~LOC~~ SOLN: SUBCUTANEOUS | Status: DC

## 2013-03-20 ENCOUNTER — Ambulatory Visit (INDEPENDENT_AMBULATORY_CARE_PROVIDER_SITE_OTHER): Payer: PRIVATE HEALTH INSURANCE | Admitting: Family Medicine

## 2013-03-20 VITALS — BP 141/78 | HR 72 | Temp 97.3°F | Ht 70.5 in | Wt 227.0 lb

## 2013-03-20 DIAGNOSIS — F411 Generalized anxiety disorder: Secondary | ICD-10-CM

## 2013-03-20 DIAGNOSIS — R0609 Other forms of dyspnea: Secondary | ICD-10-CM

## 2013-03-20 DIAGNOSIS — E119 Type 2 diabetes mellitus without complications: Secondary | ICD-10-CM

## 2013-03-20 DIAGNOSIS — G40909 Epilepsy, unspecified, not intractable, without status epilepticus: Secondary | ICD-10-CM

## 2013-03-20 DIAGNOSIS — I10 Essential (primary) hypertension: Secondary | ICD-10-CM

## 2013-03-20 DIAGNOSIS — R55 Syncope and collapse: Secondary | ICD-10-CM

## 2013-03-20 DIAGNOSIS — F419 Anxiety disorder, unspecified: Secondary | ICD-10-CM
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
## 2013-03-26 ENCOUNTER — Ambulatory Visit: Payer: Self-pay | Admitting: Family Medicine

## 2013-03-29 ENCOUNTER — Other Ambulatory Visit: Payer: Self-pay | Admitting: *Deleted

## 2013-03-29 MED ORDER — RAMIPRIL 10 MG PO CAPS: 10.0000 mg | ORAL_CAPSULE | Freq: Every day | ORAL | Status: DC

## 2013-04-09 ENCOUNTER — Telehealth: Payer: Self-pay | Admitting: Family Medicine

## 2013-04-09 NOTE — Telephone Encounter (Signed)
Lmtrc.....ghudy,rn................[name]

## 2013-04-09 NOTE — Telephone Encounter (Signed)
Spoke with pt stated sertraline made him sad and felt depressed stated he "weaned himself off" also stated he called Dr Diona Browner office and cancelled his appt with him due to financial reasons.  Instructed pt to call if wants to restart med --pt agreed and verbalized instructions.    Dr Modesto Charon  Made Aware of the above message

## 2013-04-13 ENCOUNTER — Telehealth: Payer: Self-pay | Admitting: General Practice

## 2013-04-13 NOTE — Telephone Encounter (Signed)
Patient may have symbicort samples 160-4.5 mcg

## 2013-04-13 NOTE — Telephone Encounter (Signed)
PLEASE ADVISE.

## 2013-04-14 ENCOUNTER — Telehealth: Payer: Self-pay | Admitting: General Practice

## 2013-04-14 NOTE — Telephone Encounter (Signed)
Samples up front 

## 2013-04-14 NOTE — Telephone Encounter (Signed)
Samples up front. Pts sister notified

## 2013-04-14 NOTE — Telephone Encounter (Signed)
Patient may come by office for symbicort samples. thx

## 2013-04-14 NOTE — Telephone Encounter (Signed)
Samples of symbicort up front. Pts sister notified

## 2013-04-15 ENCOUNTER — Ambulatory Visit: Payer: Self-pay | Admitting: Family Medicine

## 2013-04-19 ENCOUNTER — Ambulatory Visit: Payer: No Typology Code available for payment source | Admitting: Cardiology

## 2013-04-21 ENCOUNTER — Encounter: Payer: Self-pay | Admitting: Gastroenterology

## 2013-04-22 ENCOUNTER — Encounter: Payer: Self-pay | Admitting: Gastroenterology

## 2013-04-22 ENCOUNTER — Ambulatory Visit (INDEPENDENT_AMBULATORY_CARE_PROVIDER_SITE_OTHER): Payer: No Typology Code available for payment source | Admitting: Gastroenterology

## 2013-04-22 VITALS — BP 129/69 | HR 82 | Temp 97.6°F | Ht 71.0 in | Wt 235.0 lb

## 2013-04-22 DIAGNOSIS — K219 Gastro-esophageal reflux disease without esophagitis: Secondary | ICD-10-CM

## 2013-04-22 DIAGNOSIS — R0989 Other specified symptoms and signs involving the circulatory and respiratory systems: Secondary | ICD-10-CM

## 2013-04-22 DIAGNOSIS — R0609 Other forms of dyspnea: Secondary | ICD-10-CM

## 2013-04-22 MED ORDER — PANTOPRAZOLE SODIUM 40 MG PO TBEC: DELAYED_RELEASE_TABLET | ORAL | Status: DC

## 2013-04-22 NOTE — Patient Instructions (Addendum)
I MADE THE REFERRAL TO  CARDIOLOGY([facility]).  CONTINUE PROTONIX. TAKE 30 MINUTES PRIOR TO BREAKFAST.  FOLLOW UP IN 6 MOS.   Patient is scheduled at [facility] May 9th at 1:30

## 2013-04-22 NOTE — Progress Notes (Signed)
Reminder in epic °

## 2013-04-22 NOTE — Progress Notes (Signed)
Cc PCP 

## 2013-04-22 NOTE — Assessment & Plan Note (Signed)
SX CONTROLLED.  CONTINUE PROTONIX. REFILL x1 YEAR.

## 2013-04-22 NOTE — Assessment & Plan Note (Signed)
AWAITING CARDIOLOGY EVALUATION  REFER TO [facility]. OPV IN 6 MOS.
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
## 2013-04-30 ENCOUNTER — Other Ambulatory Visit: Payer: Self-pay

## 2013-04-30 MED ORDER — METFORMIN HCL 500 MG PO TABS: 500.0000 mg | ORAL_TABLET | Freq: Two times a day (BID) | ORAL | Status: DC

## 2013-04-30 MED ORDER — HYDROCHLOROTHIAZIDE 12.5 MG PO CAPS: 12.5000 mg | ORAL_CAPSULE | Freq: Every day | ORAL | Status: DC

## 2013-04-30 MED ORDER — TERAZOSIN HCL 5 MG PO CAPS: 5.0000 mg | ORAL_CAPSULE | Freq: Every day | ORAL | Status: DC

## 2013-04-30 MED ORDER — DIAZEPAM 5 MG PO TABS: 5.0000 mg | ORAL_TABLET | Freq: Two times a day (BID) | ORAL | Status: DC | PRN

## 2013-04-30 MED ORDER — ATORVASTATIN CALCIUM 20 MG PO TABS: 20.0000 mg | ORAL_TABLET | Freq: Every day | ORAL | Status: DC

## 2013-04-30 MED ORDER — MELOXICAM 15 MG PO TABS: 15.0000 mg | ORAL_TABLET | Freq: Every day | ORAL | Status: DC

## 2013-04-30 MED ORDER — FLUTICASONE PROPIONATE 50 MCG/ACT NA SUSP: 1.0000 | Freq: Every day | NASAL | Status: DC

## 2013-04-30 MED ORDER — TIMOLOL MALEATE 0.5 % OP SOLN: 1.0000 [drp] | Freq: Every morning | OPHTHALMIC | Status: DC

## 2013-04-30 MED ORDER — LATANOPROST 0.005 % OP SOLN: 1.0000 [drp] | Freq: Every day | OPHTHALMIC | Status: DC

## 2013-04-30 MED ORDER — RAMIPRIL 10 MG PO CAPS: 10.0000 mg | ORAL_CAPSULE | Freq: Every day | ORAL | Status: DC

## 2013-04-30 MED ORDER — PANTOPRAZOLE SODIUM 40 MG PO TBEC: DELAYED_RELEASE_TABLET | ORAL | Status: DC

## 2013-04-30 NOTE — Telephone Encounter (Signed)
rx ready for pick up please make sure they are all ready for mail order

## 2013-04-30 NOTE — Telephone Encounter (Signed)
Last seen 01/08/13  CJ Hall  Last lipids, glucose 1/14   Print an dhave nurse call patient to pick up for mail order

## 2013-05-07 ENCOUNTER — Encounter: Payer: Self-pay | Admitting: Internal Medicine

## 2013-05-07 ENCOUNTER — Ambulatory Visit (INDEPENDENT_AMBULATORY_CARE_PROVIDER_SITE_OTHER): Payer: No Typology Code available for payment source | Admitting: Internal Medicine

## 2013-05-07 ENCOUNTER — Encounter: Payer: Self-pay | Admitting: *Deleted

## 2013-05-07 VITALS — BP 141/68 | HR 85 | Wt 233.0 lb

## 2013-05-07 DIAGNOSIS — R0789 Other chest pain: Secondary | ICD-10-CM

## 2013-05-07 DIAGNOSIS — R0602 Shortness of breath: Secondary | ICD-10-CM

## 2013-05-07 NOTE — Patient Instructions (Addendum)
Your physician recommends that you schedule a follow-up appointment in: to be determined post test  Your physician has requested that you have a lexiscan myoview. For further information please visit [email or web address] Please follow instruction sheet, as given.
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
## 2013-05-10 ENCOUNTER — Other Ambulatory Visit: Payer: Self-pay | Admitting: *Deleted

## 2013-05-10 ENCOUNTER — Encounter: Payer: Self-pay | Admitting: *Deleted

## 2013-05-10 MED ORDER — ONETOUCH DELICA LANCETS 33G MISC: 1.0000 | Freq: Two times a day (BID) | Status: DC

## 2013-05-10 MED ORDER — GLUCOSE BLOOD VI STRP: ORAL_STRIP | Status: DC

## 2013-05-14 ENCOUNTER — Encounter (HOSPITAL_COMMUNITY)
Admission: RE | Admit: 2013-05-14 | Discharge: 2013-05-14 | Disposition: A | Discharge status: Home / Self Care | Payer: No Typology Code available for payment source | Source: Ambulatory Visit | Attending: Internal Medicine | Admitting: Internal Medicine

## 2013-05-14 ENCOUNTER — Encounter (HOSPITAL_COMMUNITY): Payer: Self-pay

## 2013-05-14 ENCOUNTER — Ambulatory Visit (HOSPITAL_COMMUNITY)
Admission: RE | Admit: 2013-05-14 | Discharge: 2013-05-14 | Disposition: A | Discharge status: Home / Self Care | Payer: No Typology Code available for payment source | Source: Ambulatory Visit | Attending: Internal Medicine | Admitting: Internal Medicine

## 2013-05-14 DIAGNOSIS — R079 Chest pain, unspecified: Secondary | ICD-10-CM | POA: Insufficient documentation

## 2013-05-14 DIAGNOSIS — R0602 Shortness of breath: Secondary | ICD-10-CM

## 2013-05-14 DIAGNOSIS — R0609 Other forms of dyspnea: Secondary | ICD-10-CM | POA: Insufficient documentation

## 2013-05-14 DIAGNOSIS — R0789 Other chest pain: Secondary | ICD-10-CM

## 2013-05-14 DIAGNOSIS — R0989 Other specified symptoms and signs involving the circulatory and respiratory systems: Secondary | ICD-10-CM | POA: Insufficient documentation

## 2013-05-14 MED ORDER — SODIUM CHLORIDE 0.9 % IJ SOLN
INTRAMUSCULAR | Status: AC
  Administered 2013-05-14: 10 mL via INTRAVENOUS
  Filled 2013-05-14: qty 10

## 2013-05-14 MED ORDER — TECHNETIUM TC 99M SESTAMIBI - CARDIOLITE
10.0000 | Freq: Once | INTRAVENOUS | Status: AC | PRN
  Administered 2013-05-14: 10 via INTRAVENOUS

## 2013-05-14 MED ORDER — REGADENOSON 0.4 MG/5ML IV SOLN
INTRAVENOUS | Status: AC
  Administered 2013-05-14: 0.4 mg via INTRAVENOUS
  Filled 2013-05-14: qty 5

## 2013-05-14 MED ORDER — TECHNETIUM TC 99M SESTAMIBI - CARDIOLITE
30.0000 | Freq: Once | INTRAVENOUS | Status: AC | PRN
  Administered 2013-05-14: 11:00:00 31 via INTRAVENOUS

## 2013-05-14 NOTE — Progress Notes (Signed)
Stress Lab Nurses Notes - Masao Junker Petrella 05/14/2013 Reason for doing test: Chest Pain and Dyspnea Type of test: Marlane Hatcher Nurse performing test: Parke Poisson, RN Nuclear Medicine Tech: Lou Cal Echo Tech: Not Applicable MD performing test: R. Rothbart & Joni Reining NP Family MD: Christell Constant Test explained and consent signed: yes IV started: 22g jelco, Saline lock flushed, No redness or edema and Saline lock started in radiology Symptoms: Stomach discomfort Treatment/Intervention: None Reason test stopped: protocol completed After recovery IV was: Discontinued via X-ray tech and No redness or edema Patient to return to Nuc. Med at : 11:45 Patient discharged: Home Patient's Condition upon discharge was: stable Comments: During test BP 157/74 & HR 80.  Recovery BP 145/72 & HR 70.  Symptoms resolved in recovery. Erskine Speed T

## 2013-05-17 ENCOUNTER — Telehealth: Payer: Self-pay | Admitting: *Deleted

## 2013-05-17 ENCOUNTER — Other Ambulatory Visit: Payer: Self-pay | Admitting: Family Medicine

## 2013-05-17 NOTE — Telephone Encounter (Signed)
CALLING FOR TEST RESULTS/TMJ

## 2013-05-18 ENCOUNTER — Telehealth: Payer: Self-pay | Admitting: Cardiology

## 2013-05-18 ENCOUNTER — Telehealth: Payer: Self-pay | Admitting: Family Medicine

## 2013-05-18 NOTE — Telephone Encounter (Signed)
Samples up front 

## 2013-05-18 NOTE — Telephone Encounter (Signed)
Patient is calling wanting to know his results of the stress test done this past Friday (05/14/2013)  His call back phone is:  [phone] (home)

## 2013-05-19 ENCOUNTER — Encounter: Payer: Self-pay | Admitting: General Practice

## 2013-05-19 ENCOUNTER — Ambulatory Visit (INDEPENDENT_AMBULATORY_CARE_PROVIDER_SITE_OTHER): Payer: PRIVATE HEALTH INSURANCE | Admitting: General Practice

## 2013-05-19 VITALS — BP 166/80 | HR 73 | Temp 97.5°F | Ht 70.5 in | Wt 235.5 lb

## 2013-05-19 DIAGNOSIS — E119 Type 2 diabetes mellitus without complications: Secondary | ICD-10-CM

## 2013-05-19 DIAGNOSIS — F411 Generalized anxiety disorder: Secondary | ICD-10-CM

## 2013-05-19 DIAGNOSIS — E785 Hyperlipidemia, unspecified: Secondary | ICD-10-CM

## 2013-05-19 DIAGNOSIS — K219 Gastro-esophageal reflux disease without esophagitis: Secondary | ICD-10-CM

## 2013-05-19 DIAGNOSIS — Z09 Encounter for follow-up examination after completed treatment for conditions other than malignant neoplasm: Secondary | ICD-10-CM

## 2013-05-19 DIAGNOSIS — M545 Low back pain: Secondary | ICD-10-CM

## 2013-05-19 DIAGNOSIS — I10 Essential (primary) hypertension: Secondary | ICD-10-CM

## 2013-05-19 LAB — POCT CBC
Granulocyte percent: 72.6 % (ref 37–80)
HCT, POC: 40.5 % — AB (ref 43.5–53.7)
Hemoglobin: 13.8 g/dL — AB (ref 14.1–18.1)
Lymph, poc: 1.9 (ref 0.6–3.4)
MCH, POC: 29.9 pg (ref 27–31.2)
MCHC: 34 g/dL (ref 31.8–35.4)
MCV: 88 fL (ref 80–97)
MPV: 7 fL (ref 0–99.8)
POC Granulocyte: 5.7 (ref 2–6.9)
POC LYMPH PERCENT: 24.8 % (ref 10–50)
Platelet Count, POC: 290 K/uL (ref 142–424)
RBC: 4.6 M/uL — AB (ref 4.69–6.13)
RDW, POC: 13.9 %
WBC: 7.8 K/uL (ref 4.6–10.2)

## 2013-05-19 LAB — PSA: PSA: 3.5 ng/mL (ref <=4.00)

## 2013-05-19 LAB — COMPLETE METABOLIC PANEL WITH GFR
Albumin: 4.5 g/dL (ref 3.5–5.2)
BUN: 10 mg/dL (ref 6–23)
CO2: 26 mEq/L (ref 19–32)
GFR, Est African American: >89 mL/min
GFR, Est Non African American: 79 mL/min
Glucose, Bld: 103 mg/dL — ABNORMAL HIGH (ref 70–99)
Sodium: 140 mEq/L (ref 135–145)
Total Bilirubin: 0.5 mg/dL (ref 0.3–1.2)
Total Protein: 7.4 g/dL (ref 6.0–8.3)

## 2013-05-19 MED ORDER — METFORMIN HCL 1000 MG PO TABS: 1000.0000 mg | ORAL_TABLET | Freq: Two times a day (BID) | ORAL | Status: DC

## 2013-05-19 MED ORDER — PANTOPRAZOLE SODIUM 40 MG PO TBEC: DELAYED_RELEASE_TABLET | ORAL | Status: DC

## 2013-05-19 MED ORDER — ATORVASTATIN CALCIUM 20 MG PO TABS: ORAL_TABLET | ORAL | Status: DC

## 2013-05-19 MED ORDER — DIAZEPAM 5 MG PO TABS: 5.0000 mg | ORAL_TABLET | Freq: Two times a day (BID) | ORAL | Status: DC | PRN

## 2013-05-19 MED ORDER — AMLODIPINE BESYLATE 10 MG PO TABS: 10.0000 mg | ORAL_TABLET | Freq: Every day | ORAL | Status: DC

## 2013-05-19 MED ORDER — HYDROCHLOROTHIAZIDE 12.5 MG PO CAPS: 12.5000 mg | ORAL_CAPSULE | Freq: Every day | ORAL | Status: DC

## 2013-05-19 MED ORDER — MELOXICAM 15 MG PO TABS: 15.0000 mg | ORAL_TABLET | Freq: Every day | ORAL | Status: DC

## 2013-05-19 NOTE — Telephone Encounter (Signed)
See phone note 05-17-13, addressed in notation
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
## 2013-05-19 NOTE — Patient Instructions (Addendum)

## 2013-05-19 NOTE — Telephone Encounter (Signed)
Pending results, pt aware

## 2013-05-20 ENCOUNTER — Ambulatory Visit: Payer: PRIVATE HEALTH INSURANCE | Admitting: General Practice

## 2013-05-20 ENCOUNTER — Encounter: Payer: Self-pay | Admitting: Pharmacist

## 2013-05-20 ENCOUNTER — Ambulatory Visit (INDEPENDENT_AMBULATORY_CARE_PROVIDER_SITE_OTHER): Payer: PRIVATE HEALTH INSURANCE | Admitting: Pharmacist

## 2013-05-20 ENCOUNTER — Telehealth: Payer: Self-pay

## 2013-05-20 VITALS — BP 144/76 | HR 68 | Ht 70.5 in | Wt 238.0 lb

## 2013-05-20 DIAGNOSIS — E785 Hyperlipidemia, unspecified: Secondary | ICD-10-CM

## 2013-05-20 DIAGNOSIS — E119 Type 2 diabetes mellitus without complications: Secondary | ICD-10-CM

## 2013-05-20 DIAGNOSIS — I10 Essential (primary) hypertension: Secondary | ICD-10-CM

## 2013-05-20 DIAGNOSIS — Z79899 Other long term (current) drug therapy: Secondary | ICD-10-CM

## 2013-05-20 NOTE — Telephone Encounter (Signed)
Per Raynelle Fanning, pt called yesterday and said that he wanted some results of some tests that Dr. Darrick Penna ordered. I have LMOM for a return call. I only see that she had referred him to [facility].

## 2013-05-20 NOTE — Patient Instructions (Addendum)
Levemir (green) = Lantus (purple) Inject 66 units at bedtime/night   Apidra = Humalog = Novolog Inject 20 units each morning

## 2013-05-20 NOTE — Progress Notes (Signed)
Diabetes Follow-Up Visit Chief Complaint:   Chief Complaint  Patient presents with  . Medication Problem  . Diabetes     Filed Vitals:   05/20/13 2134  BP: 144/76  Pulse: 68    HPI: Patient's AODM is currently well controlled.  His A1c was 6.8% yesterday but today patient's main concern is that he has reached the coverage gap for his Medicare PartD plan.    Current Diabetes Medications:  levemir 66 units at bedtime and Apridra up to 20 units with breakfast, metformin 1000mg  twice daily with food  Exam Edema:  negative  Polyuria:  negative  Polydipsia:  negative Polyphagia:  negative  BMI:  Body mass index is 33.66 kg/(m^2).   Weight changes:  stable General Appearance:  alert, oriented, no acute distress and well nourished Mood/Affect:  anxious   Low fat/carbohydrate diet?  Yes Nicotine Abuse?  No Medication Compliance?  Yes Exercise?  No Alcohol Abuse?  No  Home BG Monitoring:  Checking 2 times a day. Average:  115  High: 150  Low:  73    Lab Results  Component Value Date   HGBA1C 6.8% 05/19/2013    No results found for this basename: MICROALBUR,  [id number]        Assessment: 1.  Diabetes.  Controlled but concerns about being able to afford medications 2.  Blood Pressure.  Slightly elevated SBP 3.  Lipids.  Labs drawn yesterday are pening   Recommendations: 1.  Patient given 1 sample pen of Levemir and 4 samples pens of Humalog.  Patient is educated about short and long acting insulins and given written instructions about substituting Humalog for Apidra. 2.  Will contact indigent program through [facility] to see if he qualifies to get medication through their program.  If not then will contact pharmaceutical companies to see if patient qualifies for their programs.    3.  BP goal < 140/80. 4.  LDL goal of < 100, HDL > 40 and TG < 150 5.  Return to clinic in 2 months    Time spent counseling patient:  30 minutes

## 2013-05-20 NOTE — Telephone Encounter (Signed)
NO LABS ORDERED BY [facility]. REVIEWED.

## 2013-05-20 NOTE — Telephone Encounter (Signed)
Pt called back and said that they had called from [facility] and said they will be reviewing the stress test results and give him a call.

## 2013-05-20 NOTE — Telephone Encounter (Signed)
REVIEWED.  

## 2013-05-21 LAB — NMR LIPOPROFILE WITH LIPIDS
HDL Size: 8.4 nm — ABNORMAL LOW (ref >=9.2)
HDL-C: 41 mg/dL (ref >=40)
LDL Particle Number: 1030 nmol/L — ABNORMAL HIGH (ref <1000)
LP-IR Score: 67 — ABNORMAL HIGH (ref <=45)
Large HDL-P: 1.9 umol/L — ABNORMAL LOW (ref >=4.8)
Triglycerides: 94 mg/dL (ref <150)
VLDL Size: 46.4 nm (ref <=46.6)

## 2013-05-25 ENCOUNTER — Telehealth: Payer: Self-pay | Admitting: Internal Medicine

## 2013-05-25 NOTE — Telephone Encounter (Signed)
Results of myoview / tgs  °

## 2013-05-26 ENCOUNTER — Telehealth: Payer: Self-pay | Admitting: Pharmacist

## 2013-05-26 ENCOUNTER — Telehealth: Payer: Self-pay | Admitting: General Practice

## 2013-05-26 NOTE — Telephone Encounter (Signed)
Message is being handled in another telephone encounter

## 2013-05-26 NOTE — Telephone Encounter (Signed)
Spoke to pt to advise results/instructions. Pt understood. As follows:  Notes Recorded by Pricilla Riffle, MD on 05/24/2013 at 11:15 PM Apical thinning but otherwise normal perfusion. No convincing evidence for significant ischemia No change in medicine.

## 2013-05-26 NOTE — Telephone Encounter (Signed)
Spoke to pt to advise results/instructions. Pt understood.  as follows:  Notes Recorded by Pricilla Riffle, MD on 05/24/2013 at 11:15 PM Apical thinning but otherwise normal perfusion. No convincing evidence for significant ischemia No change in medicine.

## 2013-05-27 ENCOUNTER — Telehealth: Payer: Self-pay | Admitting: Pharmacist

## 2013-05-27 NOTE — Telephone Encounter (Signed)
No samples available - patient notified by Baruch Gouty

## 2013-05-27 NOTE — Telephone Encounter (Signed)
Pt aware, take 2000 iu

## 2013-06-02 ENCOUNTER — Telehealth: Payer: Self-pay | Admitting: Pharmacist

## 2013-06-02 NOTE — Telephone Encounter (Signed)
Left #2 samples of Lantus solostart pens for patient.  He understands to take either lantus of levemir and to use the same dose.   I called patient assistance program at [facility] to see if they received his referral and prescriptions.   Left message on Sonja Gunns VM.

## 2013-06-04 NOTE — Telephone Encounter (Signed)
RX GIVEN

## 2013-06-11 ENCOUNTER — Other Ambulatory Visit: Payer: Self-pay | Admitting: General Practice

## 2013-06-11 ENCOUNTER — Telehealth: Payer: Self-pay | Admitting: *Deleted

## 2013-06-11 DIAGNOSIS — K219 Gastro-esophageal reflux disease without esophagitis: Secondary | ICD-10-CM

## 2013-06-11 MED ORDER — OMEPRAZOLE 40 MG PO CPDR: 40.0000 mg | DELAYED_RELEASE_CAPSULE | Freq: Every day | ORAL | Status: DC

## 2013-06-11 NOTE — Telephone Encounter (Signed)
Omeprazole called in to pharmacy already. thx

## 2013-06-11 NOTE — Telephone Encounter (Signed)
Patient is currently on Protonix 40 daily. It is on back order at the pharmacy for over a month, can we change him to something else? Please have nurse call in new order to [facility] and also call pt at [phone] to let him know

## 2013-06-28 ENCOUNTER — Telehealth: Payer: Self-pay | Admitting: General Practice

## 2013-06-28 NOTE — Telephone Encounter (Signed)
2 symbicort samples given

## 2013-06-30 ENCOUNTER — Telehealth: Payer: Self-pay | Admitting: Family Medicine

## 2013-06-30 NOTE — Telephone Encounter (Signed)
#  2 levemir pens left in lab refrig. Patient notified.

## 2013-07-12 ENCOUNTER — Ambulatory Visit (INDEPENDENT_AMBULATORY_CARE_PROVIDER_SITE_OTHER): Payer: PRIVATE HEALTH INSURANCE | Admitting: General Practice

## 2013-07-12 ENCOUNTER — Encounter: Payer: Self-pay | Admitting: General Practice

## 2013-07-12 VITALS — BP 147/76 | HR 77 | Temp 97.1°F | Ht 68.0 in | Wt 239.0 lb

## 2013-07-12 DIAGNOSIS — M545 Low back pain: Secondary | ICD-10-CM

## 2013-07-12 NOTE — Patient Instructions (Addendum)
Back Pain, Adult  Low back pain is very common. About 1 in 5 people have back pain. The cause of low back pain is rarely dangerous. The pain often gets better over time. About half of people with a sudden onset of back pain feel better in just 2 weeks. About 8 in 10 people feel better by 6 weeks.   CAUSES  Some common causes of back pain include:  · Strain of the muscles or ligaments supporting the spine.  · Wear and tear (degeneration) of the spinal discs.  · Arthritis.  · Direct injury to the back.  DIAGNOSIS  Most of the time, the direct cause of low back pain is not known. However, back pain can be treated effectively even when the exact cause of the pain is unknown. Answering your caregiver's questions about your overall health and symptoms is one of the most accurate ways to make sure the cause of your pain is not dangerous. If your caregiver needs more information, he or she may order lab work or imaging tests (X-rays or MRIs). However, even if imaging tests show changes in your back, this usually does not require surgery.  HOME CARE INSTRUCTIONS  For many people, back pain returns. Since low back pain is rarely dangerous, it is often a condition that people can learn to manage on their own.   · Remain active. It is stressful on the back to sit or stand in one place. Do not sit, drive, or stand in one place for more than 30 minutes at a time. Take short walks on level surfaces as soon as pain allows. Try to increase the length of time you walk each day.  · Do not stay in bed. Resting more than 1 or 2 days can delay your recovery.  · Do not avoid exercise or work. Your body is made to move. It is not dangerous to be active, even though your back may hurt. Your back will likely heal faster if you return to being active before your pain is gone.  · Pay attention to your body when you  bend and lift. Many people have less discomfort when lifting if they bend their knees, keep the load close to their bodies, and  avoid twisting. Often, the most comfortable positions are those that put less stress on your recovering back.  · Find a comfortable position to sleep. Use a firm mattress and lie on your side with your knees slightly bent. If you lie on your back, put a pillow under your knees.  · Only take over-the-counter or prescription medicines as directed by your caregiver. Over-the-counter medicines to reduce pain and inflammation are often the most helpful. Your caregiver may prescribe muscle relaxant drugs. These medicines help dull your pain so you can more quickly return to your normal activities and healthy exercise.  · Put ice on the injured area.  · Put ice in a plastic bag.  · Place a towel between your skin and the bag.  · Leave the ice on for 15-20 minutes, 3-4 times a day for the first 2 to 3 days. After that, ice and heat may be alternated to reduce pain and spasms.  · Ask your caregiver about trying back exercises and gentle massage. This may be of some benefit.  · Avoid feeling anxious or stressed. Stress increases muscle tension and can worsen back pain. It is important to recognize when you are anxious or stressed and learn ways to manage it. Exercise is a great option.  SEEK MEDICAL CARE IF:  · You have pain that is not relieved with rest or   medicine.  · You have pain that does not improve in 1 week.  · You have new symptoms.  · You are generally not feeling well.  SEEK IMMEDIATE MEDICAL CARE IF:   · You have pain that radiates from your back into your legs.  · You develop new bowel or bladder control problems.  · You have unusual weakness or numbness in your arms or legs.  · You develop nausea or vomiting.  · You develop abdominal pain.  · You feel faint.  Document Released: 12/16/2005 Document Revised: 06/16/2012 Document Reviewed: 05/06/2011  ExitCare® Patient Information ©2014 [employer].

## 2013-07-12 NOTE — Progress Notes (Signed)
  Subjective:    Patient ID: [name], male    DOB: Jan 04, 1933, 79 y.o.   MRN: [id number]  HPI Presents today with complaints of low back pain. He reports this is chronic, due to injury years ago at work. He reports having to lean more forward to be more comfort. He reports taking vicodin and mobic as prescribed. He also reports performing back strengthening exercise and uses heat compresses to affected area. Reports he has seen a chiropractor in the past, with minimal pain relief. Reports being seen by [facility] specialist and recommendations made for physical therapy.    Review of Systems  Constitutional: Negative for fever and chills.  Respiratory: Negative for chest tightness and shortness of breath.   Cardiovascular: Negative for chest pain and palpitations.  Musculoskeletal: Positive for back pain.       Reports having arthritis  Neurological: Negative for dizziness, weakness and headaches.       Objective:   Physical Exam  Constitutional: He is oriented to person, place, and time. He appears well-developed and well-nourished.  Pulmonary/Chest: Effort normal and breath sounds normal. No respiratory distress. He exhibits no tenderness.  Abdominal: Soft. Bowel sounds are normal. He exhibits no distension. There is no tenderness.  Musculoskeletal: He exhibits tenderness.  Limited range of motion, grimacing with lean forward 15 degree. Tenderness noted with palpation to lumbar region  Neurological: He is alert and oriented to person, place, and time.  Skin: Skin is warm and dry.  Psychiatric: He has a normal mood and affect.          Assessment & Plan:  1. Low back pain -Continue current medications -Continue heat therapy as directed -maintain scheduled appointments  -RTO if symptoms worsen or seek emergency medical attention -Patient verbalized understanding -Coralie Keens, FNP-C

## 2013-07-15 ENCOUNTER — Telehealth: Payer: Self-pay | Admitting: General Practice

## 2013-07-22 ENCOUNTER — Ambulatory Visit (INDEPENDENT_AMBULATORY_CARE_PROVIDER_SITE_OTHER): Payer: PRIVATE HEALTH INSURANCE | Admitting: Pharmacist

## 2013-07-22 ENCOUNTER — Other Ambulatory Visit: Payer: Self-pay | Admitting: General Practice

## 2013-07-22 ENCOUNTER — Telehealth: Payer: Self-pay | Admitting: Pharmacist

## 2013-07-22 ENCOUNTER — Encounter: Payer: Self-pay | Admitting: Pharmacist

## 2013-07-22 VITALS — BP 140/60 | HR 68 | Ht 68.0 in | Wt 238.0 lb

## 2013-07-22 DIAGNOSIS — M545 Low back pain: Secondary | ICD-10-CM

## 2013-07-22 DIAGNOSIS — E119 Type 2 diabetes mellitus without complications: Secondary | ICD-10-CM

## 2013-07-22 DIAGNOSIS — Z79899 Other long term (current) drug therapy: Secondary | ICD-10-CM

## 2013-07-22 MED ORDER — HYDROCODONE-ACETAMINOPHEN 5-500 MG PO TABS: 1.0000 | ORAL_TABLET | ORAL | Status: DC | PRN

## 2013-07-22 NOTE — Telephone Encounter (Signed)
Please inform that script is ready for pick up. thx

## 2013-07-22 NOTE — Progress Notes (Signed)
Diabetes Follow-Up Visit Chief Complaint:   Chief Complaint  Patient presents with  . Diabetes     Filed Vitals:   07/22/13 1341  BP: 140/60  Pulse: 68    HPI: Patient's AODM is currently well controlled.  His A1c was 6.8% in May 2014.  We just received paper work from Medication Assistance Program today.  Our office is trying to keep patient supplied with samples until he can begin receiveing medications through this program. He is in the coverage gap through Medicare Part D  Current Diabetes Medications:  levemir 66 units at bedtime and Apridra up to 20 units with breakfast, metformin 1000mg  twice daily with food  Exam Edema:  negative  Polyuria:  negative  Polydipsia:  negative  Polyphagia:  negative  BMI:  Body mass index is 36.2 kg/(m^2).   Weight changes:  stable General Appearance:  alert, oriented, no acute distress and well nourished Mood/Affect:  anxious   Low fat/carbohydrate diet?  Yes Nicotine Abuse?  No Medication Compliance?  Yes Exercise?  No Alcohol Abuse?  No  Home BG Monitoring:  Checking 2 times a day. Average:  110   High:  152 Low:  70 - occurred when working outside - pt knew how to correct    Lab Results  Component Value Date   HGBA1C 6.8% 05/19/2013    No results found for this basename: MICROALBUR,  [id number]        Assessment: 1.  Diabetes.  Controlled but concerns about being able to afford medications 2.  Blood Pressure.  Slightly elevated SBP 3.  Lipids - last LDL-P was very slightly elevated   Recommendations: 1.  Patient given 1 sample pen of Levemir and 2 samples pens of Humalog.  Patient is educated about short and long acting insulins and given written instructions about substituting Humalog for Apidra. 2.  Will get his PCP to complete paperwork for medication assistance program and send back   3.  BP goal < 140/80. 4.  LDL goal of < 100, HDL > 40 and TG < 150 5.  Return to clinic in 2 months    Time spent counseling  patient:  30 minutes

## 2013-07-23 NOTE — Telephone Encounter (Signed)
Left message, pain script available .

## 2013-08-03 ENCOUNTER — Other Ambulatory Visit: Payer: Self-pay | Admitting: *Deleted

## 2013-08-03 DIAGNOSIS — K219 Gastro-esophageal reflux disease without esophagitis: Secondary | ICD-10-CM

## 2013-08-03 MED ORDER — OMEPRAZOLE 40 MG PO CPDR: 40.0000 mg | DELAYED_RELEASE_CAPSULE | Freq: Every day | ORAL | Status: DC

## 2013-08-11 ENCOUNTER — Telehealth: Payer: Self-pay | Admitting: Nurse Practitioner

## 2013-08-12 NOTE — Telephone Encounter (Signed)
Left message that samples are ready

## 2013-08-27 ENCOUNTER — Ambulatory Visit (INDEPENDENT_AMBULATORY_CARE_PROVIDER_SITE_OTHER): Payer: PRIVATE HEALTH INSURANCE | Admitting: General Practice

## 2013-08-27 VITALS — BP 192/84 | HR 80 | Temp 98.2°F | Ht 68.0 in | Wt 238.0 lb

## 2013-08-27 DIAGNOSIS — R5383 Other fatigue: Secondary | ICD-10-CM

## 2013-08-27 DIAGNOSIS — R5381 Other malaise: Secondary | ICD-10-CM

## 2013-08-27 LAB — POCT CBC
HCT, POC: 41 % — AB (ref 43.5–53.7)
Hemoglobin: 13.4 g/dL — AB (ref 14.1–18.1)
Lymph, poc: 1.8 (ref 0.6–3.4)
MCH, POC: 27.9 pg (ref 27–31.2)
MCHC: 32.6 g/dL (ref 31.8–35.4)
MCV: 85.5 fL (ref 80–97)
RBC: 4.8 M/uL (ref 4.69–6.13)
WBC: 7.5 10*3/uL (ref 4.6–10.2)

## 2013-08-27 NOTE — Progress Notes (Signed)
  Subjective:    Patient ID: [name], male    DOB: Sep 21, 1933, 79 y.o.   MRN: [id number]  HPI Patient presents today with complaints of feeling fatigued. He reports working in the yard and other activities, as tolerated daily. He reports being out in the sun working most of the day. He reports receiving Levemir insulin from [facility] medication program, but he uses levemir pens and would like to continue. He denies taking blood pressure medication this morning.     Review of Systems  Constitutional: Positive for fatigue. Negative for fever and chills.  HENT: Negative for neck pain and neck stiffness.   Respiratory: Negative for chest tightness and shortness of breath.   Cardiovascular: Negative for chest pain and palpitations.  Gastrointestinal: Negative for vomiting, abdominal pain, diarrhea and blood in stool.  Genitourinary: Negative for difficulty urinating.  Musculoskeletal: Positive for back pain.       Seeing ortho specialist at [facility]  Neurological: Negative for dizziness, weakness and headaches.       Objective:   Physical Exam  Constitutional: He is oriented to person, place, and time. He appears well-developed and well-nourished.  HENT:  Head: Normocephalic and atraumatic.  Cardiovascular: Normal rate, regular rhythm and normal heart sounds.   Pulmonary/Chest: Effort normal and breath sounds normal.  Abdominal: Soft. Bowel sounds are normal. He exhibits no distension. There is no tenderness.  Neurological: He is alert and oriented to person, place, and time.  Skin: Skin is warm and dry.  Psychiatric: He has a normal mood and affect.          Assessment & Plan:  1. Fatigue - POCT CBC - Thyroid Panel With TSH - Vitamin B12 -Continue current medications (insulin flex pen) -RTO if symptoms worsen or unresolved -Patient verbalized understanding -Coralie Keens, FNP-C

## 2013-08-28 ENCOUNTER — Other Ambulatory Visit: Payer: Self-pay | Admitting: Nurse Practitioner

## 2013-08-28 LAB — THYROID PANEL WITH TSH
Free Thyroxine Index: 1.7 (ref 1.2–4.9)
T3 Uptake Ratio: 26 % (ref 24–39)

## 2013-08-28 LAB — VITAMIN B12: Vitamin B-12: 396 pg/mL (ref 211–946)

## 2013-09-01 ENCOUNTER — Telehealth: Payer: Self-pay | Admitting: General Practice

## 2013-09-06 ENCOUNTER — Other Ambulatory Visit: Payer: Self-pay | Admitting: Family Medicine

## 2013-09-09 ENCOUNTER — Telehealth: Payer: Self-pay | Admitting: General Practice

## 2013-09-09 NOTE — Telephone Encounter (Signed)
No samples at this time avalible patient aware

## 2013-09-14 ENCOUNTER — Telehealth: Payer: Self-pay | Admitting: General Practice

## 2013-09-14 NOTE — Telephone Encounter (Signed)
Patient aware only symbicort

## 2013-09-15 ENCOUNTER — Other Ambulatory Visit: Payer: Self-pay | Admitting: Nurse Practitioner

## 2013-09-16 ENCOUNTER — Other Ambulatory Visit: Payer: Self-pay | Admitting: Nurse Practitioner

## 2013-09-20 ENCOUNTER — Ambulatory Visit (INDEPENDENT_AMBULATORY_CARE_PROVIDER_SITE_OTHER): Payer: PRIVATE HEALTH INSURANCE

## 2013-09-20 DIAGNOSIS — Z23 Encounter for immunization: Secondary | ICD-10-CM

## 2013-09-23 ENCOUNTER — Ambulatory Visit: Payer: Self-pay

## 2013-09-28 ENCOUNTER — Telehealth: Payer: Self-pay | Admitting: General Practice

## 2013-09-29 NOTE — Telephone Encounter (Signed)
Na samples available

## 2013-10-11 ENCOUNTER — Other Ambulatory Visit: Payer: Self-pay | Admitting: Nurse Practitioner

## 2013-10-12 ENCOUNTER — Other Ambulatory Visit: Payer: Self-pay | Admitting: Nurse Practitioner

## 2013-10-13 ENCOUNTER — Ambulatory Visit (INDEPENDENT_AMBULATORY_CARE_PROVIDER_SITE_OTHER): Payer: PRIVATE HEALTH INSURANCE | Admitting: Pharmacist

## 2013-10-13 ENCOUNTER — Encounter: Payer: Self-pay | Admitting: Pharmacist

## 2013-10-13 ENCOUNTER — Encounter (INDEPENDENT_AMBULATORY_CARE_PROVIDER_SITE_OTHER): Payer: Self-pay

## 2013-10-13 ENCOUNTER — Other Ambulatory Visit: Payer: Self-pay | Admitting: *Deleted

## 2013-10-13 VITALS — BP 150/80 | HR 80 | Ht 68.0 in | Wt 238.0 lb

## 2013-10-13 DIAGNOSIS — E559 Vitamin D deficiency, unspecified: Secondary | ICD-10-CM | POA: Insufficient documentation

## 2013-10-13 DIAGNOSIS — E119 Type 2 diabetes mellitus without complications: Secondary | ICD-10-CM

## 2013-10-13 DIAGNOSIS — E785 Hyperlipidemia, unspecified: Secondary | ICD-10-CM

## 2013-10-13 DIAGNOSIS — I10 Essential (primary) hypertension: Secondary | ICD-10-CM

## 2013-10-13 DIAGNOSIS — R569 Unspecified convulsions: Secondary | ICD-10-CM

## 2013-10-13 LAB — POCT UA - MICROALBUMIN: Microalbumin Ur, POC: POSITIVE mg/L

## 2013-10-13 LAB — POCT GLYCOSYLATED HEMOGLOBIN (HGB A1C): Hemoglobin A1C: 6.9

## 2013-10-13 MED ORDER — GLUCOSE BLOOD VI STRP: ORAL_STRIP | Status: DC

## 2013-10-13 NOTE — Progress Notes (Signed)
Diabetes Follow-Up Visit Chief Complaint:   Chief Complaint  Patient presents with  . Diabetes  . Hyperlipidemia     Filed Vitals:   10/13/13 1238  BP: 150/80  Pulse: 80   Filed Weights   10/13/13 1238  Weight: 238 lb (107.956 kg)    HPI: Patient's AODM is currently well controlled.  His A1c was 6.8% in May 2014.  He is due to have labs rechecked today.   Patient's biggest concerns today are regarding a letter from his insurance stating that it will end 12/29/13 and he will have to choose a new plan.   [name] is also concern about his history of seizures.  Apparently this was diagnosed when he had an accident about 2 years ago.  It is unclear if accident was possibly related to a seizure or a hypoglycemic event.    Current Diabetes Medications:  levemir 66 units at bedtime and Apridra up to 20 units with breakfast, metformin 1000mg  twice daily with food  Exam Edema:  negative  Polyuria:  negative  Polydipsia:  negative  Polyphagia:  negative  BMI:  Body mass index is 36.2 kg/(m^2).   Weight changes:  stable General Appearance:  alert, oriented, no acute distress and well nourished Mood/Affect:  anxious   Low fat/carbohydrate diet?  Yes Nicotine Abuse?  No Medication Compliance?  Yes Exercise?  No Alcohol Abuse?  No  Home BG Monitoring:  Checking 1 times a day. Average:  110   High:  152 Low:  87    Lab Results  Component Value Date   HGBA1C 6.9% 10/13/2013    No results found for this basename: MICROALBUR,  [id number]        Assessment: 1.  Diabetes.  Controlled but continued concerns about being able to afford medications 2.  Blood Pressure.  Slightly elevated SBP 3.  Lipids - last LDL-P was very slightly elevated   Recommendations: 1.  Patient given 1 sample pen of Levemir and 1 sample pen of Apidra 2.  Called [facility] office for [facility] and set up appt for patient to meet with them to help select a Medicare plan for next year. 3.  BP goal <  140/80. 4.  LDL goal of < 100, HDL > 40 and TG < 150 5.  Return to clinic in 2 months  Orders Placed This Encounter  Procedures  . CMP14+EGFR  . NMR, lipoprofile  . Vit D  25 hydroxy (rtn osteoporosis monitoring)  . Microalbumin, urine  . Ambulatory referral to Neurology    Referral Priority:  Routine    Referral Type:  Consultation    Referral Reason:  Specialty Services Required    Requested Specialty:  Neurology    Number of Visits Requested:  1  . POCT UA - Microalbumin  . POCT glycosylated hemoglobin (Hb A1C)      Time spent counseling patient:  40 minutes   [name], PharmD, CPP

## 2013-10-14 LAB — MICROALBUMIN, URINE: Microalbumin, Urine: 5.3 ug/mL (ref 0.0–17.0)

## 2013-10-14 NOTE — Telephone Encounter (Signed)
LAST OV 10/13/13 WITH TAMMY. LAST OV WITH PROVIDER 08/27/13. LAST RF 07/12/13. CALL IN [facility].

## 2013-10-15 LAB — CMP14+EGFR
ALT: 29 IU/L (ref 0–44)
AST: 27 IU/L (ref 0–40)
Albumin/Globulin Ratio: 1.8 (ref 1.1–2.5)
Calcium: 9.8 mg/dL (ref 8.6–10.2)
Chloride: 98 mmol/L (ref 97–108)
Glucose: 120 mg/dL — ABNORMAL HIGH (ref 65–99)
Potassium: 4.5 mmol/L (ref 3.5–5.2)
Sodium: 140 mmol/L (ref 134–144)
Total Protein: 7.4 g/dL (ref 6.0–8.5)

## 2013-10-15 LAB — NMR, LIPOPROFILE
Cholesterol: 146 mg/dL
HDL Cholesterol by NMR: 49 mg/dL
HDL Particle Number: 41.1 umol/L
LDL Particle Number: 1355 nmol/L — ABNORMAL HIGH
LDL Size: 20 nm — ABNORMAL LOW
LDLC SERPL CALC-MCNC: 67 mg/dL
LP-IR Score: 87 — ABNORMAL HIGH
Small LDL Particle Number: 977 nmol/L — ABNORMAL HIGH
Triglycerides by NMR: 149 mg/dL

## 2013-10-15 LAB — VITAMIN D 25 HYDROXY (VIT D DEFICIENCY, FRACTURES): Vit D, 25-Hydroxy: 14.6 ng/mL — ABNORMAL LOW (ref 30.0–100.0)

## 2013-10-18 ENCOUNTER — Telehealth: Payer: Self-pay | Admitting: Pharmacist

## 2013-10-18 ENCOUNTER — Other Ambulatory Visit: Payer: Self-pay | Admitting: General Practice

## 2013-10-18 DIAGNOSIS — F411 Generalized anxiety disorder: Secondary | ICD-10-CM

## 2013-10-18 MED ORDER — DIAZEPAM 5 MG PO TABS: 5.0000 mg | ORAL_TABLET | Freq: Two times a day (BID) | ORAL | Status: DC | PRN

## 2013-10-18 NOTE — Progress Notes (Signed)
Our referral department contacted patient about neurology appt (which he requested) and he refused appt at this time.

## 2013-10-18 NOTE — Telephone Encounter (Signed)
Left message to call office for laboratory results

## 2013-10-19 ENCOUNTER — Telehealth: Payer: Self-pay | Admitting: General Practice

## 2013-10-19 DIAGNOSIS — E559 Vitamin D deficiency, unspecified: Secondary | ICD-10-CM

## 2013-10-19 NOTE — Telephone Encounter (Signed)
Pt aware, rx for valium ready. Must come to office to get it.

## 2013-10-20 MED ORDER — VITAMIN D (ERGOCALCIFEROL) 1.25 MG (50000 UNIT) PO CAPS: 50000.0000 [IU] | ORAL_CAPSULE | ORAL | Status: DC

## 2013-10-20 NOTE — Telephone Encounter (Signed)
Patient notified of laboratory results.

## 2013-10-28 ENCOUNTER — Ambulatory Visit: Payer: Self-pay

## 2013-11-03 ENCOUNTER — Telehealth: Payer: Self-pay | Admitting: General Practice

## 2013-11-05 NOTE — Telephone Encounter (Signed)
TAKEN CARE OF

## 2013-11-11 ENCOUNTER — Ambulatory Visit (INDEPENDENT_AMBULATORY_CARE_PROVIDER_SITE_OTHER): Payer: Self-pay | Admitting: *Deleted

## 2013-11-11 DIAGNOSIS — R200 Anesthesia of skin: Secondary | ICD-10-CM

## 2013-11-11 DIAGNOSIS — R209 Unspecified disturbances of skin sensation: Secondary | ICD-10-CM

## 2013-11-11 NOTE — Progress Notes (Signed)
Patient ID: [name], male   DOB: 1933/03/31, 79 y.o.   MRN: [id number] Pt came into office for R hand numbness x 2 weeks Wanted to see [name] infromed pt that [name] would be in 11/14 Pt scheduled appt on 11/14 with [name]

## 2013-11-12 ENCOUNTER — Ambulatory Visit (INDEPENDENT_AMBULATORY_CARE_PROVIDER_SITE_OTHER): Payer: PRIVATE HEALTH INSURANCE | Admitting: General Practice

## 2013-11-12 ENCOUNTER — Encounter: Payer: Self-pay | Admitting: General Practice

## 2013-11-12 VITALS — BP 156/80 | HR 78 | Temp 98.5°F | Ht 68.0 in | Wt 237.5 lb

## 2013-11-12 DIAGNOSIS — R209 Unspecified disturbances of skin sensation: Secondary | ICD-10-CM

## 2013-11-12 DIAGNOSIS — R202 Paresthesia of skin: Secondary | ICD-10-CM

## 2013-11-12 NOTE — Progress Notes (Signed)
  Subjective:    Patient ID: [name], male    DOB: October 22, 1933, 79 y.o.   MRN: [id number]  HPI Patient presents today with complaints of numbness in right hand 3-5 fingers. He reports onset was one month ago and occurring more frequently. He reports having an appointment with ortho specialist in [place], on Dec. 2, 2014. He denies pain or loss of strength in right hand.     Review of Systems  Constitutional: Negative for fever and chills.  Respiratory: Negative for chest tightness and shortness of breath.   Cardiovascular: Negative for chest pain and palpitations.  Genitourinary: Negative for difficulty urinating.  Musculoskeletal: Positive for back pain.       Chronic back pain       Objective:   Physical Exam  Constitutional: He is oriented to person, place, and time. He appears well-developed and well-nourished.  Cardiovascular: Normal rate, regular rhythm and normal heart sounds.   Pulmonary/Chest: Effort normal and breath sounds normal. No respiratory distress. He exhibits no tenderness.  Neurological: He is alert and oriented to person, place, and time.  Skin: Skin is warm and dry.  Psychiatric: He has a normal mood and affect.          Assessment & Plan:  1. Paresthesia of hand -discussed and provided information sheets on finger numbness, causes -Patient to discuss numbness in fingers with ortho specialist as back pain issue address -RTO if symptoms worsen prior to ortho visit -Patient verbalized understanding -Coralie Keens, FNP-C

## 2013-11-12 NOTE — Patient Instructions (Signed)
Paresthesia °Paresthesia is an abnormal burning or prickling sensation. This sensation is generally felt in the hands, arms, legs, or feet. However, it may occur in any part of the body. It is usually not painful. The feeling may be described as: °· Tingling or numbness. °· "Pins and needles." °· Skin crawling. °· Buzzing. °· Limbs "falling asleep." °· Itching. °Most people experience temporary (transient) paresthesia at some time in their lives. °CAUSES  °Paresthesia may occur when you breathe too quickly (hyperventilation). It can also occur without any apparent cause. Commonly, paresthesia occurs when pressure is placed on a nerve. The feeling quickly goes away once the pressure is removed. For some people, however, paresthesia is a long-lasting (chronic) condition caused by an underlying disorder. The underlying disorder may be: °· A traumatic, direct injury to nerves. Examples include a: °· Broken (fractured) neck. °· Fractured skull. °· A disorder affecting the brain and spinal cord (central nervous system). Examples include: °· Transverse myelitis. °· Encephalitis. °· Transient ischemic attack. °· Multiple sclerosis. °· Stroke. °· Tumor or blood vessel problems, such as an arteriovenous malformation pressing against the brain or spinal cord. °· A condition that damages the peripheral nerves (peripheral neuropathy). Peripheral nerves are not part of the brain and spinal cord. These conditions include: °· Diabetes. °· Peripheral vascular disease. °· Nerve entrapment syndromes, such as carpal tunnel syndrome. °· Shingles. °· Hypothyroidism. °· Vitamin B12 deficiencies. °· Alcoholism. °· Heavy metal poisoning (lead, arsenic). °· Rheumatoid arthritis. °· Systemic lupus erythematosus. °DIAGNOSIS  °Your caregiver will attempt to find the underlying cause of your paresthesia. Your caregiver may: °· Take your medical history. °· Perform a physical exam. °· Order various lab tests. °· Order imaging tests. °TREATMENT    °Treatment for paresthesia depends on the underlying cause. °HOME CARE INSTRUCTIONS °· Avoid drinking alcohol. °· You may consider massage or acupuncture to help relieve your symptoms. °· Keep all follow-up appointments as directed by your caregiver. °SEEK IMMEDIATE MEDICAL CARE IF:  °· You feel weak. °· You have trouble walking or moving. °· You have problems with speech or vision. °· You feel confused. °· You cannot control your bladder or bowel movements. °· You feel numbness after an injury. °· You faint. °· Your burning or prickling feeling gets worse when walking. °· You have pain, cramps, or dizziness. °· You develop a rash. °MAKE SURE YOU: °· Understand these instructions. °· Will watch your condition. °· Will get help right away if you are not doing well or get worse. °Document Released: 12/06/2002 Document Revised: 03/09/2012 Document Reviewed: 09/06/2011 °ExitCare® Patient Information ©2014 [employer]. ° °

## 2013-11-23 ENCOUNTER — Telehealth: Payer: Self-pay | Admitting: General Practice

## 2013-11-24 ENCOUNTER — Other Ambulatory Visit: Payer: Self-pay | Admitting: General Practice

## 2013-11-24 ENCOUNTER — Telehealth: Payer: Self-pay | Admitting: *Deleted

## 2013-11-24 DIAGNOSIS — E109 Type 1 diabetes mellitus without complications: Secondary | ICD-10-CM

## 2013-11-24 NOTE — Telephone Encounter (Signed)
We have no lantus samples and he is out and he cant afford to get his rx filled

## 2013-11-24 NOTE — Telephone Encounter (Signed)
Patient aware we are out of samples.  

## 2013-11-24 NOTE — Telephone Encounter (Signed)
Message left for Sonya to inquire about lantus assistance. Refill also sent to pharmacy, will need to get filled until response from [facility].

## 2013-11-26 NOTE — Telephone Encounter (Signed)
Patient aware.

## 2013-11-29 ENCOUNTER — Other Ambulatory Visit: Payer: Self-pay | Admitting: Nurse Practitioner

## 2013-12-01 ENCOUNTER — Telehealth: Payer: Self-pay | Admitting: *Deleted

## 2013-12-01 ENCOUNTER — Encounter: Payer: Self-pay | Admitting: General Practice

## 2013-12-01 ENCOUNTER — Other Ambulatory Visit: Payer: Self-pay | Admitting: General Practice

## 2013-12-01 ENCOUNTER — Telehealth: Payer: Self-pay | Admitting: General Practice

## 2013-12-01 DIAGNOSIS — E119 Type 2 diabetes mellitus without complications: Secondary | ICD-10-CM

## 2013-12-01 MED ORDER — INSULIN DETEMIR 100 UNIT/ML FLEXPEN: 66.0000 [IU] | PEN_INJECTOR | Freq: Every day | SUBCUTANEOUS | Status: DC

## 2013-12-01 NOTE — Telephone Encounter (Signed)
Mae, can you write a script for me to fax to [facility] to get levimir pens for [employer] since the order was for vials.  Thanks

## 2013-12-01 NOTE — Telephone Encounter (Signed)
Patient aware we are completley out of lantus.

## 2013-12-17 IMAGING — CT CT HEAD W/O CM
2 series · 16 of 30 positions shown, 20 images · non-contrast
Comparison: None.

CLINICAL DATA: 78-year-old male with syncope.

CT HEAD WITHOUT CONTRAST
TECHNIQUE: Contiguous axial images were obtained from the base of
the skull through the vertex without contrast.

[Series 2: head w/o · axial · non-contrast · 0.46mm/px · z∈[-36,+89]mm · 13 of 31 slices shown, 17 images]
[im 3/31  brain]
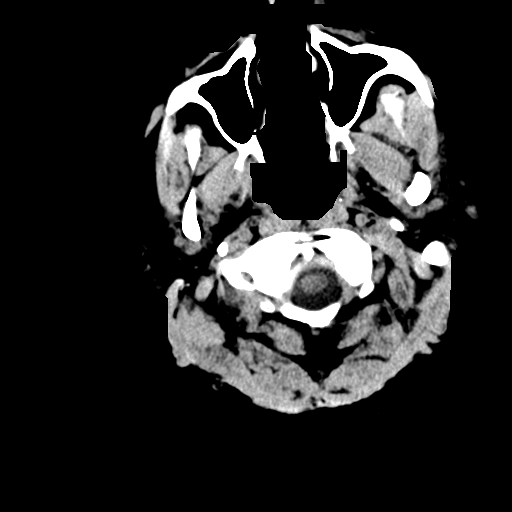
[im 3/31  bone]
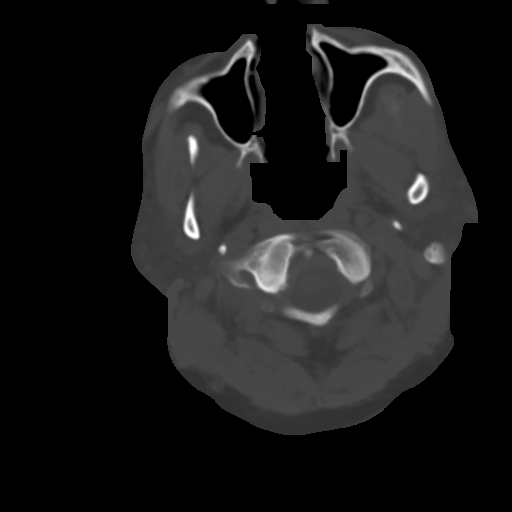
[im 5/31  brain]
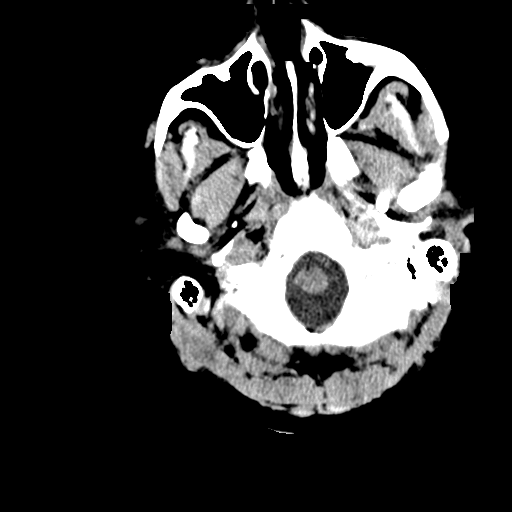
[im 7/31  brain]
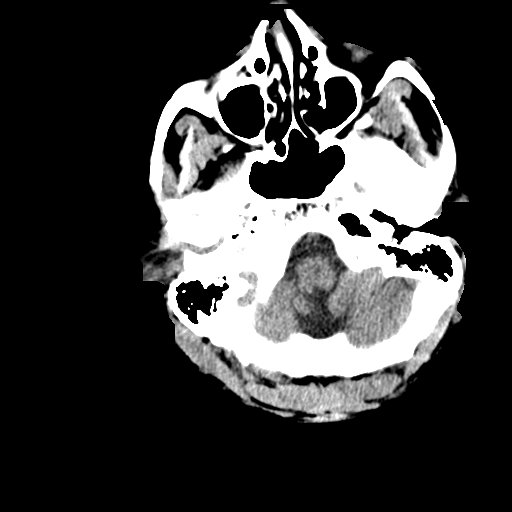
[im 9/31  brain]
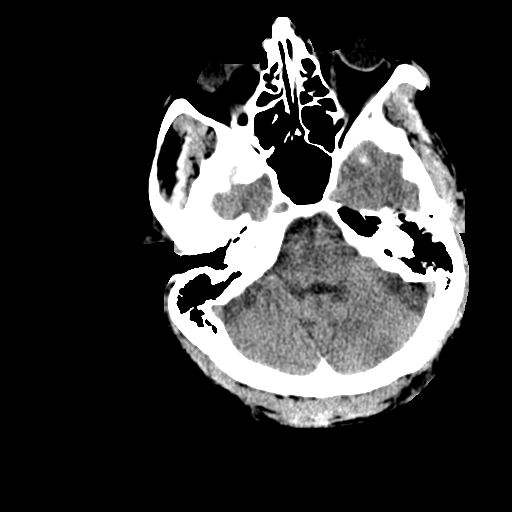
[im 11/31  brain]
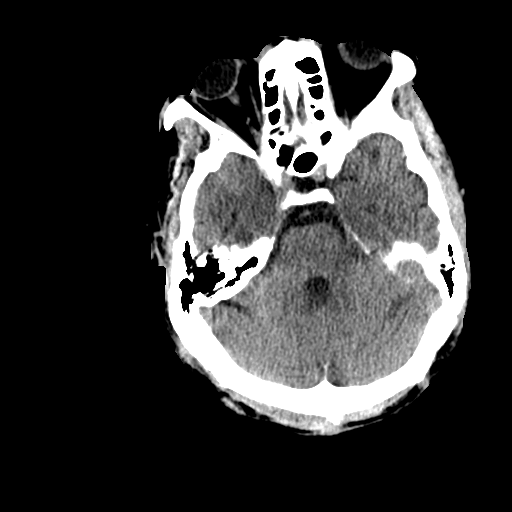
[im 11/31  bone]
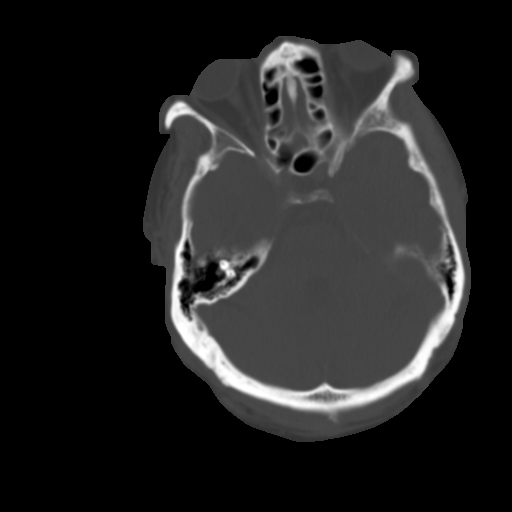
[im 13/31  brain]
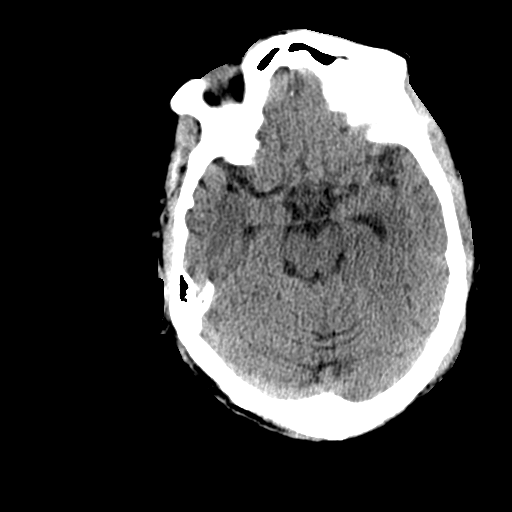
[im 16/31  brain]
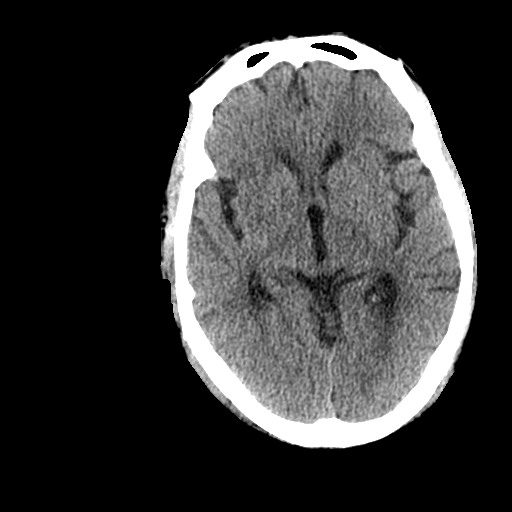
[im 18/31  brain]
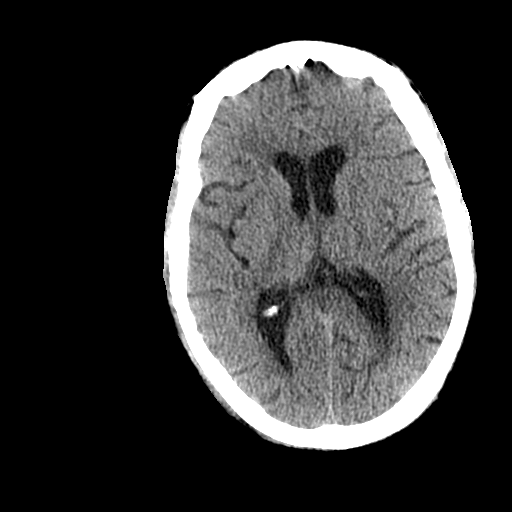
[im 20/31  brain]
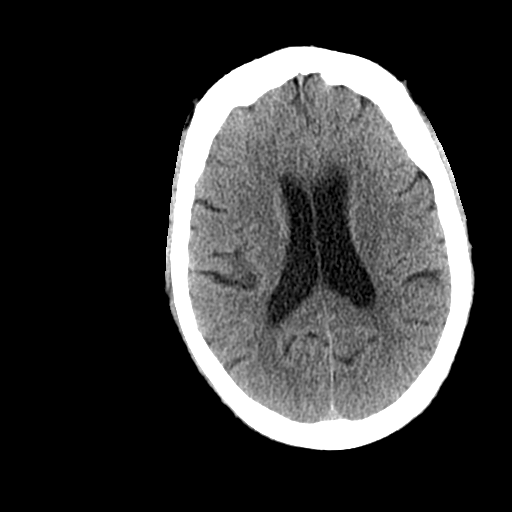
[im 20/31  bone]
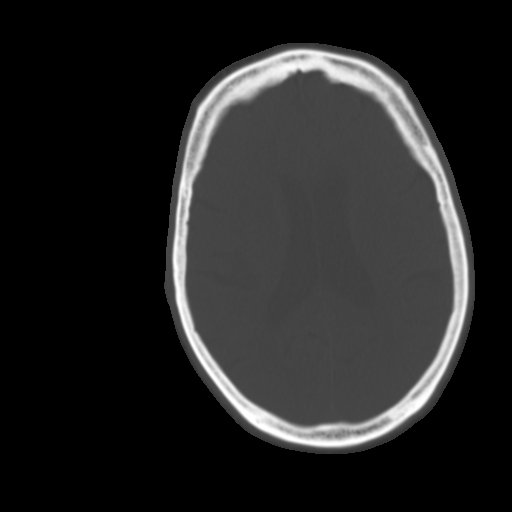
[im 22/31  brain]
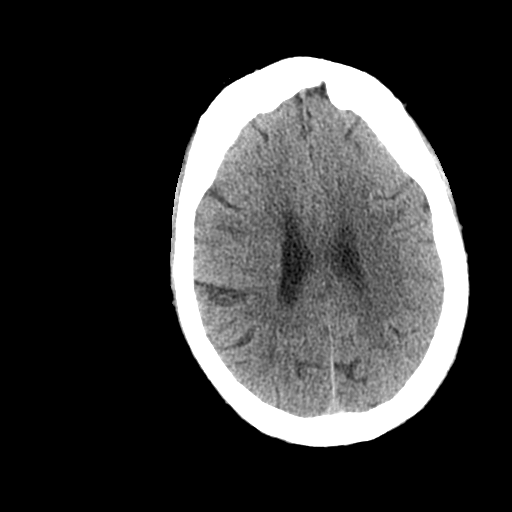
[im 24/31  brain]
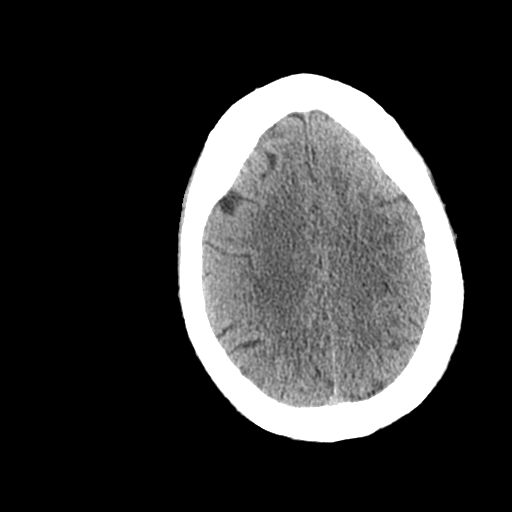
[im 26/31  brain]
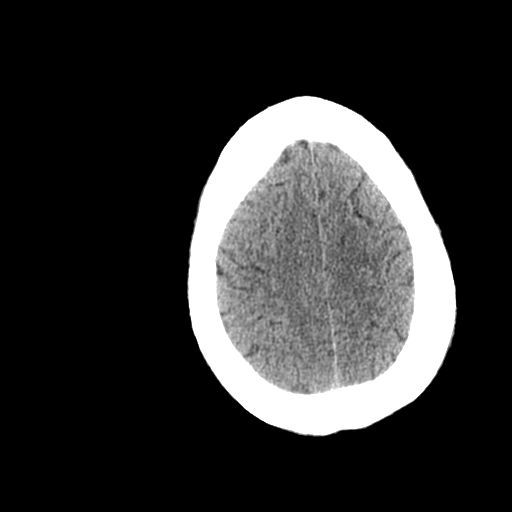
[im 28/31  brain]
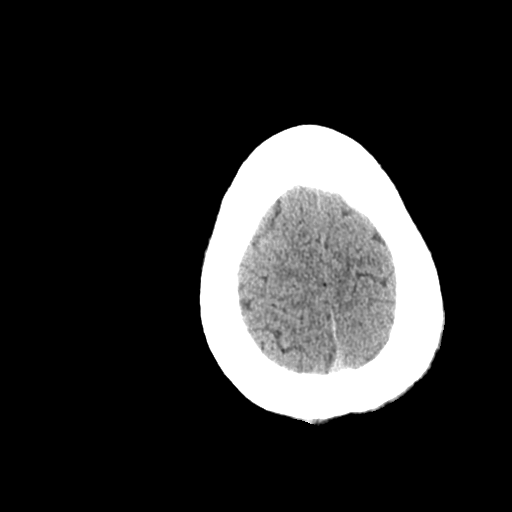
[im 28/31  bone]
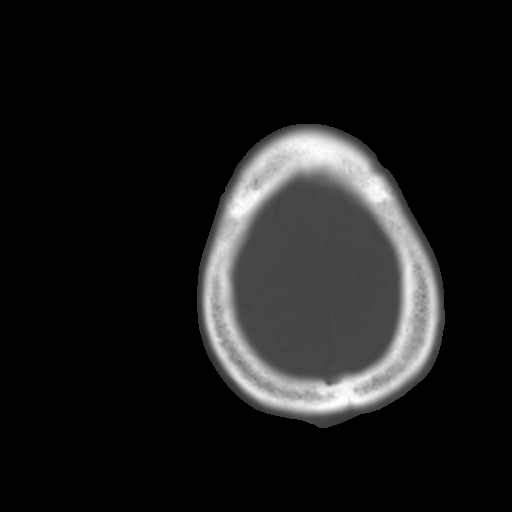

[Series 3: bone windows · axial · 0.46mm/px · z∈[-36,+4]mm · 3 of 31 slices shown]
[im 3/31  bone]
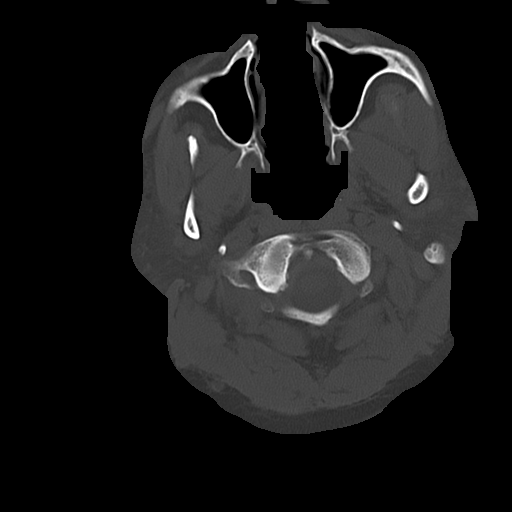
[im 7/31  bone]
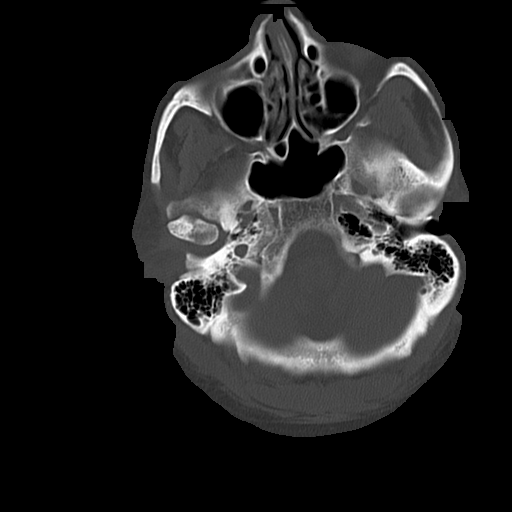
[im 11/31  bone]
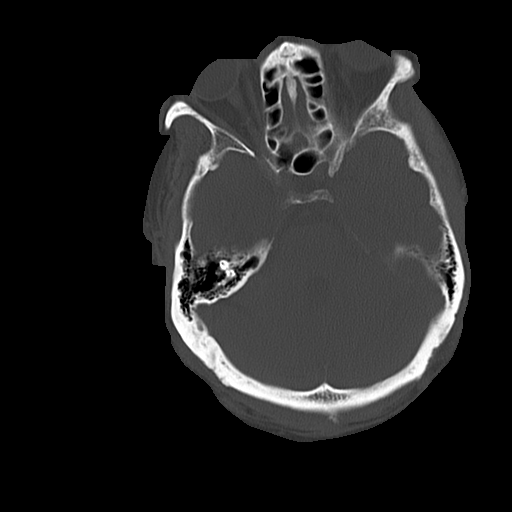

[16 of 30 positions shown; findings below may reference images not displayed]

FINDINGS: Visualized paranasal sinuses and mastoids are clear.
Suggestion of left posterior convexity scalp hematoma.  Underlying
calvarium intact.  No other acute scalp or orbit soft tissue
findings.

Cerebral volume is within normal limits for age.  No midline shift,
ventriculomegaly, mass effect, evidence of mass lesion,
intracranial hemorrhage or evidence of cortically based acute
infarction.  Gray-white matter differentiation is within normal
limits throughout the brain.  No suspicious intracranial vascular
hyperdensity.
IMPRESSION: Normal noncontrast CT appearance of the brain for age.  Left vertex
hematoma without underlying fracture.

## 2013-12-19 ENCOUNTER — Other Ambulatory Visit: Payer: Self-pay | Admitting: Nurse Practitioner

## 2013-12-21 ENCOUNTER — Telehealth: Payer: Self-pay | Admitting: General Practice

## 2013-12-21 NOTE — Telephone Encounter (Signed)
Pt stated "I don't feel right in the brain" Informed pt he needs to be evaluated in the ER Verbalizes understanding

## 2013-12-27 ENCOUNTER — Other Ambulatory Visit: Payer: Self-pay

## 2013-12-27 ENCOUNTER — Telehealth: Payer: Self-pay | Admitting: General Practice

## 2013-12-27 MED ORDER — ONETOUCH DELICA LANCETS 33G MISC: 1.0000 | Freq: Two times a day (BID) | Status: DC

## 2013-12-27 NOTE — Telephone Encounter (Signed)
Appt given for tomorrow

## 2013-12-28 ENCOUNTER — Encounter: Payer: Self-pay | Admitting: General Practice

## 2013-12-28 ENCOUNTER — Ambulatory Visit (INDEPENDENT_AMBULATORY_CARE_PROVIDER_SITE_OTHER): Payer: PRIVATE HEALTH INSURANCE | Admitting: General Practice

## 2013-12-28 VITALS — BP 161/75 | HR 90 | Temp 98.3°F | Ht 68.0 in | Wt 240.0 lb

## 2013-12-28 DIAGNOSIS — M545 Low back pain: Secondary | ICD-10-CM

## 2013-12-28 NOTE — Progress Notes (Signed)
   Subjective:    Patient ID: [name], male    DOB: April 03, 1933, 79 y.o.   MRN: [id number]  HPI Patient presents today with complaints of chronic back pain and would like a referral to ortho specialist. He has been seen by ortho in the past and received injections.     Review of Systems  Constitutional: Negative for fever and chills.  Respiratory: Negative for chest tightness and shortness of breath.   Cardiovascular: Negative for chest pain and palpitations.  Musculoskeletal: Positive for back pain.  All other systems reviewed and are negative.       Objective:   Physical Exam  Constitutional: He is oriented to person, place, and time. He appears well-developed and well-nourished.  Cardiovascular: Normal rate, regular rhythm and normal heart sounds.   Pulmonary/Chest: Effort normal and breath sounds normal. No respiratory distress. He exhibits no tenderness.  Musculoskeletal:  Grimacing upon going from sitting to standing position. Lumbar tenderness upon palpation.   Neurological: He is alert and oriented to person, place, and time.  Skin: Skin is warm and dry.  Psychiatric: He has a normal mood and affect.          Assessment & Plan:  1. Low back pain - Ambulatory referral to Orthopedic Surgery -Continue current medications as directed -RTO if symptoms worsen, prior to ortho appointment -Patient verbalized understanding Coralie Keens, FNP-C

## 2013-12-29 ENCOUNTER — Other Ambulatory Visit: Payer: Self-pay

## 2013-12-29 ENCOUNTER — Telehealth: Payer: Self-pay | Admitting: General Practice

## 2013-12-29 DIAGNOSIS — M549 Dorsalgia, unspecified: Secondary | ICD-10-CM

## 2013-12-31 ENCOUNTER — Other Ambulatory Visit: Payer: Self-pay

## 2013-12-31 DIAGNOSIS — E559 Vitamin D deficiency, unspecified: Secondary | ICD-10-CM

## 2013-12-31 MED ORDER — VITAMIN D (ERGOCALCIFEROL) 1.25 MG (50000 UNIT) PO CAPS: 50000.0000 [IU] | ORAL_CAPSULE | ORAL | Status: DC

## 2013-12-31 MED ORDER — MELOXICAM 15 MG PO TABS: 15.0000 mg | ORAL_TABLET | Freq: Every day | ORAL | Status: DC

## 2013-12-31 NOTE — Telephone Encounter (Signed)
[facility]

## 2013-12-31 NOTE — Telephone Encounter (Signed)
Last seen 12/29/13  [name]

## 2014-01-01 ENCOUNTER — Other Ambulatory Visit: Payer: Self-pay | Admitting: Nurse Practitioner

## 2014-01-05 ENCOUNTER — Other Ambulatory Visit: Payer: Self-pay | Admitting: Nurse Practitioner

## 2014-01-13 IMAGING — US US CAROTID DUPLEX BILAT
1 series · 13 of 24 positions shown · non-contrast
Comparison: None

CLINICAL DATA: syncope, hypertension, diabetes.

BILATERAL CAROTID DUPLEX ULTRASOUND
TECHNIQUE: Gray scale imaging, color Doppler and duplex ultrasound
was performed of bilateral carotid and vertebral arteries in the
neck.

[Series 1: us carotid duplex bilat · 0.07mm/px · 13 of 68 slices shown]
[im 1/68]
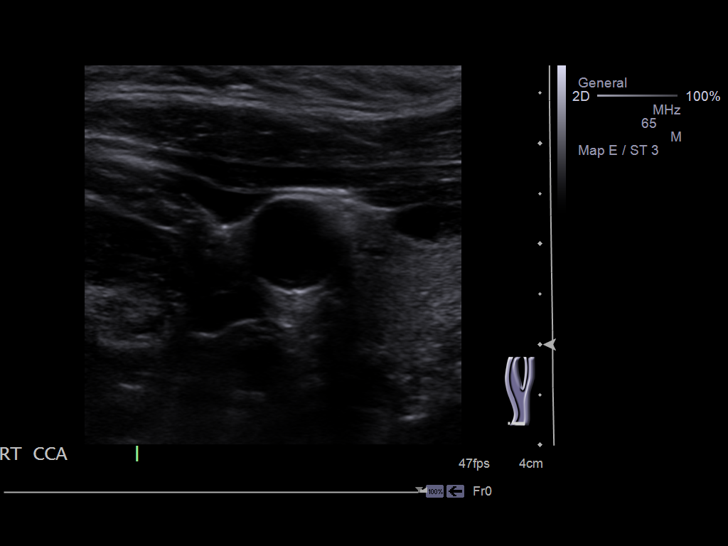
[im 6/68]
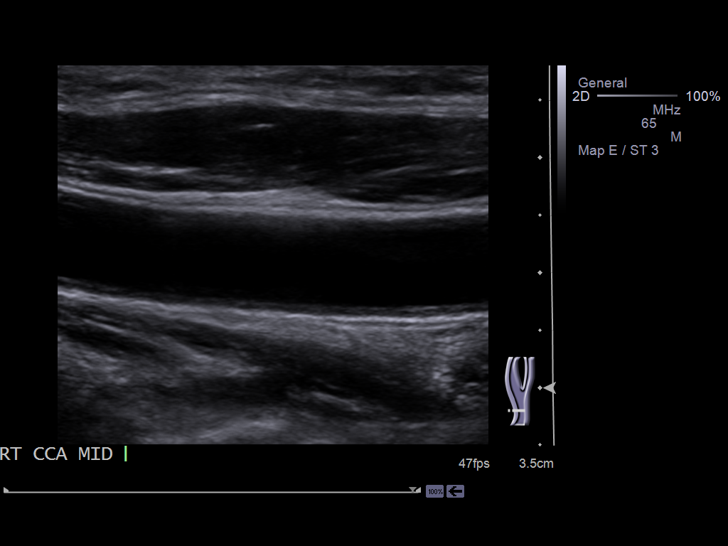
[im 12/68]
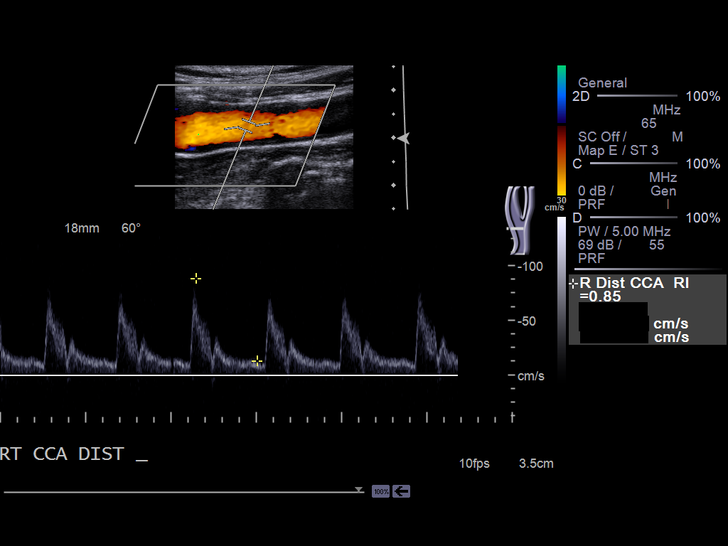
[im 18/68]
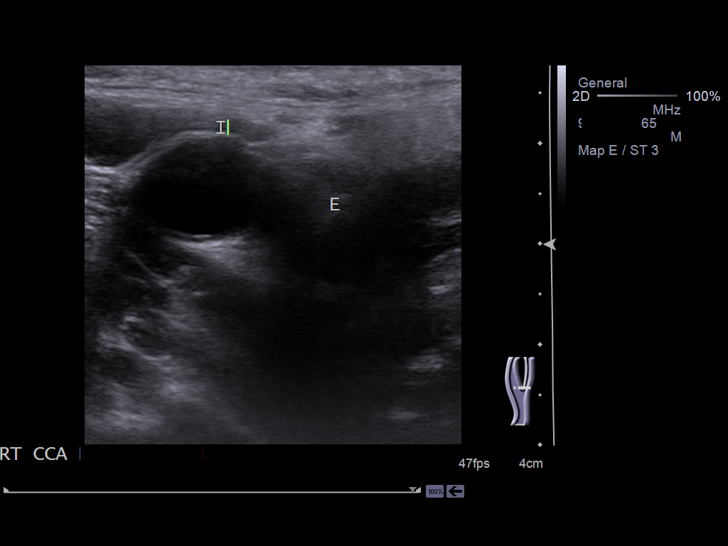
[im 24/68]
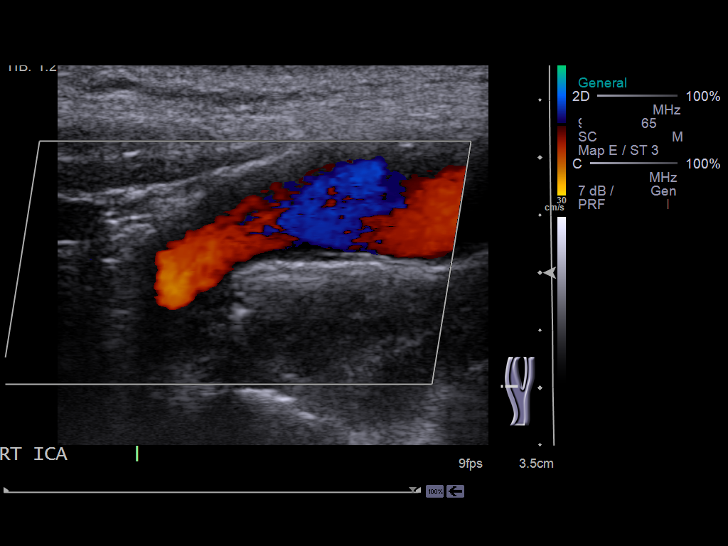
[im 30/68]
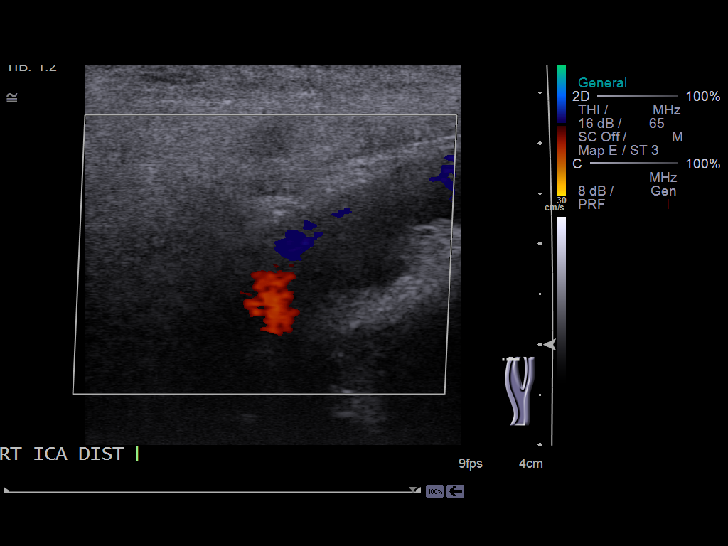
[im 35/68]
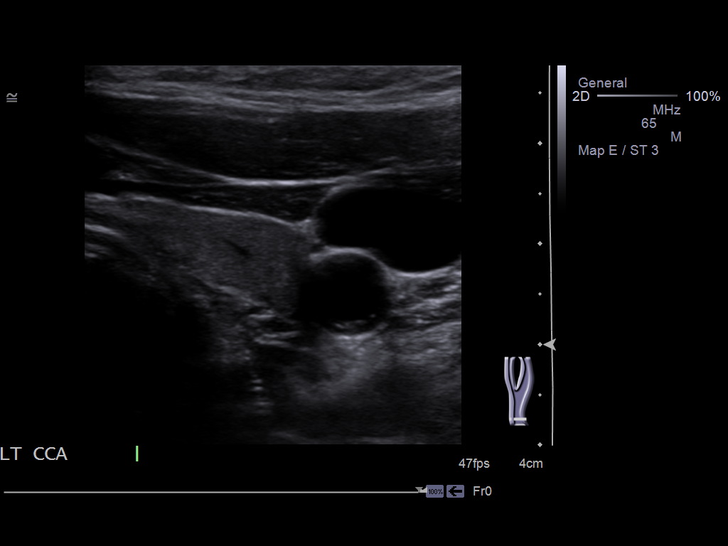
[im 38/68]
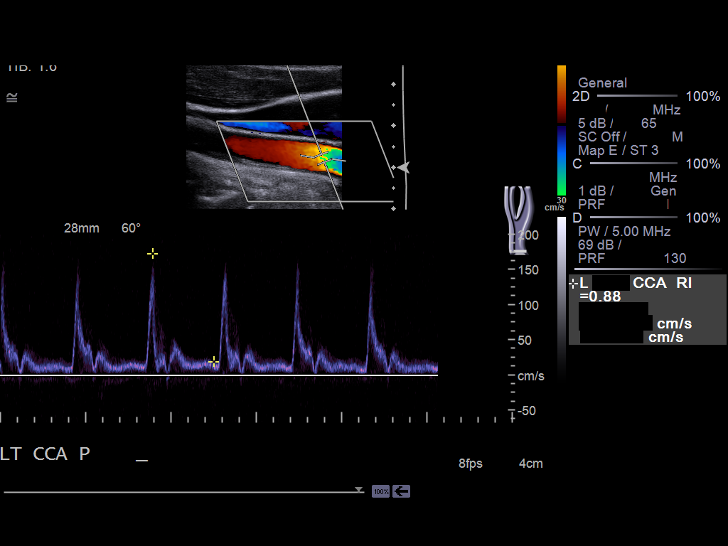
[im 44/68]
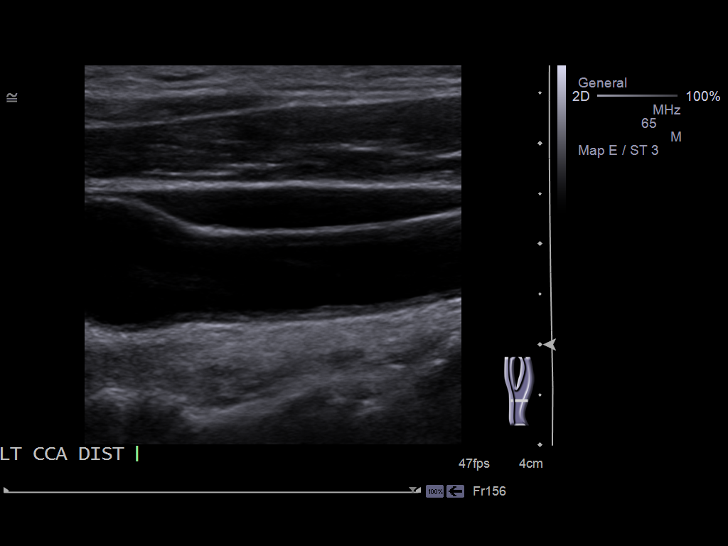
[im 50/68]
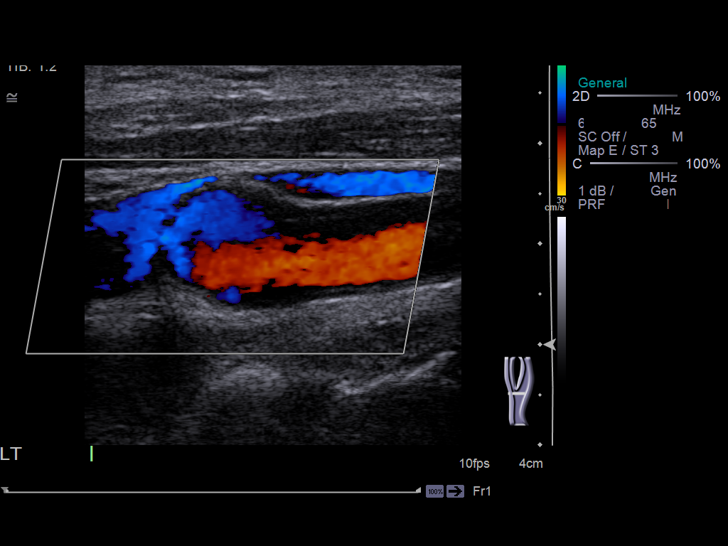
[im 56/68]
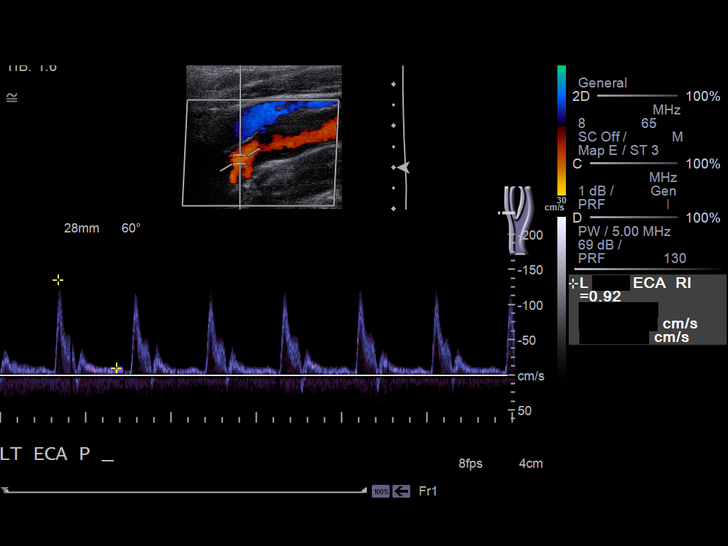
[im 62/68]
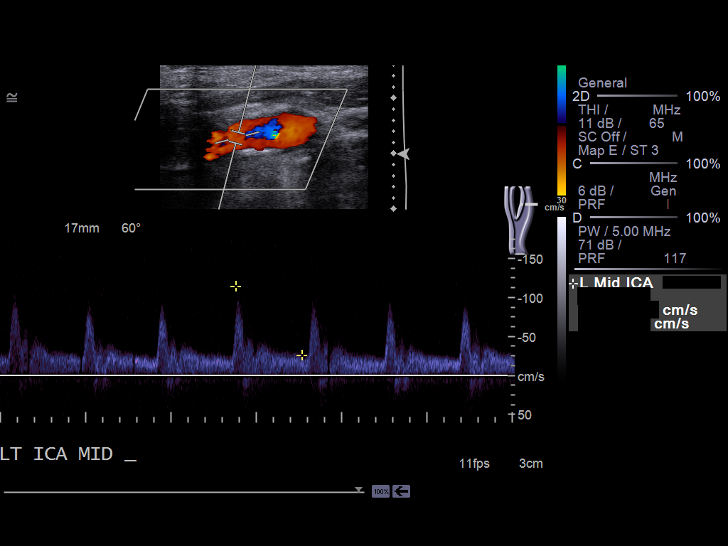
[im 68/68]
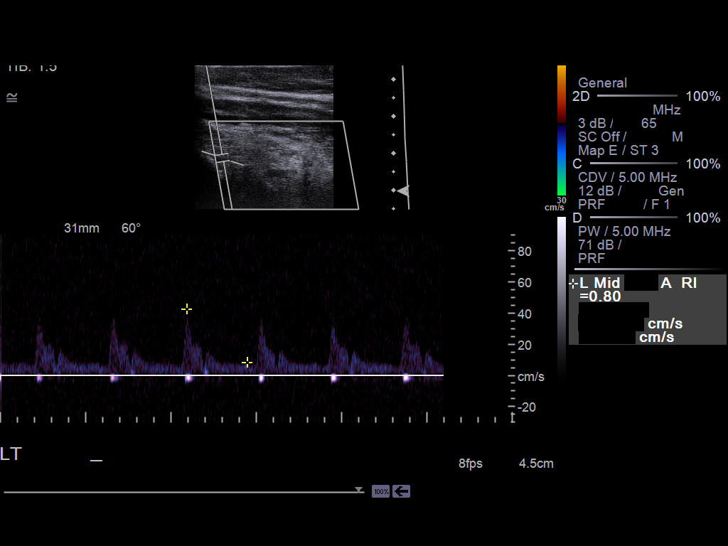

[13 of 24 positions shown; findings below may reference images not displayed]

Criteria:  Quantification of carotid stenosis is based on velocity
parameters that correlate the residual internal carotid diameter
with NASCET-based stenosis levels, using the diameter of the distal
internal carotid lumen as the denominator for stenosis measurement.

The following velocity measurements were obtained:

                 PEAK SYSTOLIC/END DIASTOLIC
RIGHT
ICA:                        74/18cm/sec
CCA:                        99/50cm/sec
SYSTOLIC ICA/CCA RATIO:
DIASTOLIC ICA/CCA RATIO:
ECA:                        139cm/sec

LEFT
ICA:                        115/26cm/sec
CCA:                        124/22cm/sec
SYSTOLIC ICA/CCA RATIO:
DIASTOLIC ICA/CCA RATIO:
ECA:                        137cm/sec
FINDINGS: RIGHT CAROTID ARTERY: There is diffuse intimal thickening.  There
is focal eccentric partially calcified plaque in the carotid bulb
and at the ICA origin without high-grade stenosis.  Normal wave
forms and color Doppler signal.

RIGHT VERTEBRAL ARTERY:  Normal flow direction and waveform.

LEFT CAROTID ARTERY: Smooth nonocclusive plaque in the mid and
distal common carotid artery.  Intimal thickening in the carotid
bulb and proximal ICA without focal plaque accumulation or
stenosis.  Normal wave forms and color Doppler signal.

LEFT VERTEBRAL ARTERY:  Normal flow direction and waveform.
IMPRESSION: 1.  Early bilateral carotid plaque, resulting in less than 50%
diameter stenosis. The exam does not exclude plaque ulceration or
embolization.  Continued surveillance recommended.

## 2014-01-15 ENCOUNTER — Other Ambulatory Visit: Payer: Self-pay | Admitting: Nurse Practitioner

## 2014-01-19 ENCOUNTER — Other Ambulatory Visit: Payer: Self-pay | Admitting: Neurological Surgery

## 2014-01-19 DIAGNOSIS — M549 Dorsalgia, unspecified: Secondary | ICD-10-CM

## 2014-01-20 ENCOUNTER — Other Ambulatory Visit: Payer: No Typology Code available for payment source

## 2014-01-21 ENCOUNTER — Ambulatory Visit
Admission: RE | Admit: 2014-01-21 | Discharge: 2014-01-21 | Disposition: A | Discharge status: Home / Self Care | Payer: Medicare Other | Source: Ambulatory Visit | Attending: Neurological Surgery | Admitting: Neurological Surgery

## 2014-01-21 DIAGNOSIS — M549 Dorsalgia, unspecified: Secondary | ICD-10-CM

## 2014-02-22 ENCOUNTER — Other Ambulatory Visit: Payer: Self-pay | Admitting: Nurse Practitioner

## 2014-02-24 ENCOUNTER — Other Ambulatory Visit: Payer: Self-pay | Admitting: Nurse Practitioner

## 2014-02-28 ENCOUNTER — Telehealth: Payer: Self-pay | Admitting: Nurse Practitioner

## 2014-03-02 NOTE — Telephone Encounter (Signed)
Last seen 12/01/13  [name]  Last lipid 10/13/13  Requesting 90 day supply

## 2014-03-03 ENCOUNTER — Telehealth: Payer: Self-pay | Admitting: General Practice

## 2014-03-04 ENCOUNTER — Other Ambulatory Visit: Payer: Self-pay | Admitting: General Practice

## 2014-03-04 DIAGNOSIS — E785 Hyperlipidemia, unspecified: Secondary | ICD-10-CM

## 2014-03-04 MED ORDER — ATORVASTATIN CALCIUM 20 MG PO TABS: 20.0000 mg | ORAL_TABLET | Freq: Every day | ORAL | Status: DC

## 2014-03-04 NOTE — Telephone Encounter (Signed)
Done

## 2014-03-10 ENCOUNTER — Telehealth: Payer: Self-pay | Admitting: General Practice

## 2014-03-11 ENCOUNTER — Other Ambulatory Visit: Payer: Self-pay | Admitting: General Practice

## 2014-03-11 DIAGNOSIS — E119 Type 2 diabetes mellitus without complications: Secondary | ICD-10-CM

## 2014-03-11 MED ORDER — METFORMIN HCL 1000 MG PO TABS: 1000.0000 mg | ORAL_TABLET | Freq: Two times a day (BID) | ORAL | Status: DC

## 2014-03-11 NOTE — Telephone Encounter (Signed)
Sent to pharmacy 

## 2014-03-14 ENCOUNTER — Other Ambulatory Visit: Payer: Self-pay | Admitting: *Deleted

## 2014-03-14 DIAGNOSIS — E559 Vitamin D deficiency, unspecified: Secondary | ICD-10-CM

## 2014-03-14 NOTE — Telephone Encounter (Signed)
Do you want him to continue rx vitamin d?

## 2014-03-16 ENCOUNTER — Telehealth: Payer: Self-pay | Admitting: Nurse Practitioner

## 2014-03-17 ENCOUNTER — Other Ambulatory Visit: Payer: Self-pay | Admitting: Family Medicine

## 2014-03-17 ENCOUNTER — Telehealth: Payer: Self-pay | Admitting: General Practice

## 2014-03-17 NOTE — Telephone Encounter (Signed)
No samples available.  He doesn't have any money to purchase insulin.  Can you suggest something that we have in stock?

## 2014-03-18 NOTE — Telephone Encounter (Signed)
Holding for MAe to address

## 2014-03-21 ENCOUNTER — Telehealth: Payer: Self-pay | Admitting: General Practice

## 2014-03-21 ENCOUNTER — Other Ambulatory Visit: Payer: Self-pay | Admitting: General Practice

## 2014-03-21 NOTE — Telephone Encounter (Signed)
Please check to see if samples available.

## 2014-03-21 NOTE — Telephone Encounter (Signed)
Patient aware.

## 2014-03-21 NOTE — Telephone Encounter (Signed)
Patient was receiving assistance with purchase of medication from Denyse Amass, he should contact her at [phone] and she work on Tues, Wed, [employer]. I will sent script to pharmacy, for now.

## 2014-03-21 NOTE — Telephone Encounter (Signed)
Patient needs to have vitamin D level rechecked. If level increased won't require higher supplement.

## 2014-03-22 NOTE — Telephone Encounter (Signed)
Patient aware.

## 2014-03-22 NOTE — Telephone Encounter (Signed)
Need vitamin D level drawn.

## 2014-04-04 ENCOUNTER — Other Ambulatory Visit: Payer: Self-pay | Admitting: *Deleted

## 2014-04-04 MED ORDER — MELOXICAM 15 MG PO TABS: 15.0000 mg | ORAL_TABLET | Freq: Every day | ORAL | Status: DC

## 2014-04-05 ENCOUNTER — Other Ambulatory Visit: Payer: Self-pay | Admitting: General Practice

## 2014-04-15 ENCOUNTER — Other Ambulatory Visit: Payer: Self-pay | Admitting: General Practice

## 2014-04-18 NOTE — Telephone Encounter (Signed)
Last ov 12/14. 

## 2014-04-20 ENCOUNTER — Telehealth: Payer: Self-pay | Admitting: General Practice

## 2014-04-20 ENCOUNTER — Other Ambulatory Visit: Payer: Self-pay | Admitting: General Practice

## 2014-04-20 NOTE — Telephone Encounter (Signed)
Patient has appt with [place] tomorrow and then he will call us back to schedule a complete physical after Dr. Merlene Laughter addresses his fingers back and head

## 2014-04-21 NOTE — Telephone Encounter (Signed)
Last seen 12/28/13 Mae  Last Vit D level 10/13/13 14.6 low

## 2014-05-05 ENCOUNTER — Other Ambulatory Visit: Payer: Self-pay | Admitting: *Deleted

## 2014-05-06 ENCOUNTER — Other Ambulatory Visit: Payer: Self-pay

## 2014-05-06 MED ORDER — INSULIN PEN NEEDLE 32G X 4 MM MISC: 1.0000 "pen " | Freq: Every day | Status: DC

## 2014-05-06 NOTE — Telephone Encounter (Signed)
Last seen 12/28/13  Mae  Last glucose 10/13/13

## 2014-05-18 ENCOUNTER — Ambulatory Visit (INDEPENDENT_AMBULATORY_CARE_PROVIDER_SITE_OTHER): Payer: Medicare Other | Admitting: Family

## 2014-05-18 ENCOUNTER — Encounter: Payer: Self-pay | Admitting: Family

## 2014-05-18 VITALS — BP 115/56 | HR 78 | Temp 99.0°F | Ht 68.0 in | Wt 229.0 lb

## 2014-05-18 DIAGNOSIS — E559 Vitamin D deficiency, unspecified: Secondary | ICD-10-CM

## 2014-05-18 DIAGNOSIS — F419 Anxiety disorder, unspecified: Secondary | ICD-10-CM

## 2014-05-18 DIAGNOSIS — K219 Gastro-esophageal reflux disease without esophagitis: Secondary | ICD-10-CM

## 2014-05-18 DIAGNOSIS — E785 Hyperlipidemia, unspecified: Secondary | ICD-10-CM

## 2014-05-18 DIAGNOSIS — E119 Type 2 diabetes mellitus without complications: Secondary | ICD-10-CM

## 2014-05-18 DIAGNOSIS — I10 Essential (primary) hypertension: Secondary | ICD-10-CM

## 2014-05-18 DIAGNOSIS — F411 Generalized anxiety disorder: Secondary | ICD-10-CM

## 2014-05-18 DIAGNOSIS — Z125 Encounter for screening for malignant neoplasm of prostate: Secondary | ICD-10-CM

## 2014-05-18 LAB — POCT GLYCOSYLATED HEMOGLOBIN (HGB A1C): Hemoglobin A1C: 6.7

## 2014-05-18 MED ORDER — ATORVASTATIN CALCIUM 20 MG PO TABS: 20.0000 mg | ORAL_TABLET | Freq: Every day | ORAL | Status: DC

## 2014-05-18 NOTE — Patient Instructions (Signed)
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
## 2014-05-19 ENCOUNTER — Other Ambulatory Visit: Payer: Medicare Other

## 2014-05-19 DIAGNOSIS — Z1212 Encounter for screening for malignant neoplasm of rectum: Secondary | ICD-10-CM

## 2014-05-19 LAB — CMP14+EGFR
ALK PHOS: 71 IU/L (ref 39–117)
ALT: 31 IU/L (ref 0–44)
AST: 31 IU/L (ref 0–40)
Albumin/Globulin Ratio: 1.9 (ref 1.1–2.5)
Albumin: 5 g/dL — ABNORMAL HIGH (ref 3.5–4.8)
BUN / CREAT RATIO: 14 (ref 10–22)
BUN: 14 mg/dL (ref 8–27)
CHLORIDE: 99 mmol/L (ref 97–108)
CO2: 23 mmol/L (ref 18–29)
Calcium: 10.5 mg/dL — ABNORMAL HIGH (ref 8.6–10.2)
Creatinine, Ser: 0.97 mg/dL (ref 0.76–1.27)
GFR calc Af Amer: 86 mL/min/{1.73_m2} (ref >59)
GFR calc non Af Amer: 74 mL/min/{1.73_m2} (ref >59)
Globulin, Total: 2.7 g/dL (ref 1.5–4.5)
Glucose: 99 mg/dL (ref 65–99)
Potassium: 4.4 mmol/L (ref 3.5–5.2)
Sodium: 139 mmol/L (ref 134–144)
Total Bilirubin: 0.2 mg/dL (ref 0.0–1.2)
Total Protein: 7.7 g/dL (ref 6.0–8.5)

## 2014-05-19 LAB — PSA, TOTAL AND FREE
PSA FREE: 3.6 ng/mL
PSA, Free Pct: 38.3 %
PSA: 9.4 ng/mL — AB (ref 0.0–4.0)

## 2014-05-19 LAB — LIPID PANEL
CHOL/HDL RATIO: 3.8 ratio (ref 0.0–5.0)
Cholesterol, Total: 147 mg/dL (ref 100–199)
HDL: 39 mg/dL — ABNORMAL LOW (ref >39)
LDL Calculated: 64 mg/dL (ref 0–99)
TRIGLYCERIDES: 219 mg/dL — AB (ref 0–149)
VLDL Cholesterol Cal: 44 mg/dL — ABNORMAL HIGH (ref 5–40)

## 2014-05-19 LAB — VITAMIN D 25 HYDROXY (VIT D DEFICIENCY, FRACTURES): VIT D 25 HYDROXY: 27.4 ng/mL — AB (ref 30.0–100.0)

## 2014-05-19 LAB — MICROALBUMIN, URINE: Microalbumin, Urine: 28.6 ug/mL — ABNORMAL HIGH (ref 0.0–17.0)

## 2014-05-20 LAB — FECAL OCCULT BLOOD, IMMUNOCHEMICAL: FECAL OCCULT BLD: NEGATIVE

## 2014-05-25 ENCOUNTER — Telehealth: Payer: Self-pay | Admitting: Family

## 2014-05-25 ENCOUNTER — Other Ambulatory Visit: Payer: Self-pay | Admitting: *Deleted

## 2014-05-25 DIAGNOSIS — I10 Essential (primary) hypertension: Secondary | ICD-10-CM

## 2014-05-25 MED ORDER — AMLODIPINE BESYLATE 10 MG PO TABS: 10.0000 mg | ORAL_TABLET | Freq: Every day | ORAL | Status: DC

## 2014-05-25 NOTE — Telephone Encounter (Signed)
Patient aware.

## 2014-06-06 ENCOUNTER — Other Ambulatory Visit: Payer: Self-pay | Admitting: Nurse Practitioner

## 2014-06-09 ENCOUNTER — Ambulatory Visit: Payer: Self-pay | Admitting: Family

## 2014-06-15 ENCOUNTER — Telehealth: Payer: Self-pay | Admitting: *Deleted

## 2014-06-15 DIAGNOSIS — F411 Generalized anxiety disorder: Secondary | ICD-10-CM

## 2014-06-15 DIAGNOSIS — M545 Low back pain, unspecified: Secondary | ICD-10-CM

## 2014-06-15 MED ORDER — DIAZEPAM 5 MG PO TABS
5.0000 mg | ORAL_TABLET | Freq: Two times a day (BID) | ORAL | Status: DC | PRN
Start: 2014-06-15 — End: 2014-06-24

## 2014-06-15 MED ORDER — HYDROCODONE-ACETAMINOPHEN 5-500 MG PO TABS: 1.0000 | ORAL_TABLET | ORAL | Status: DC | PRN

## 2014-06-15 NOTE — Telephone Encounter (Signed)
Last seen 05/18/14, last filled 04/08/14. Route to Pool A, pt uses [facility]

## 2014-06-15 NOTE — Telephone Encounter (Signed)
Diazepam called into [facility].

## 2014-06-20 ENCOUNTER — Other Ambulatory Visit: Payer: Self-pay | Admitting: General Practice

## 2014-06-21 ENCOUNTER — Telehealth: Payer: Self-pay | Admitting: Family

## 2014-06-21 ENCOUNTER — Other Ambulatory Visit: Payer: Self-pay | Admitting: General Practice

## 2014-06-21 MED ORDER — VITAMIN D (ERGOCALCIFEROL) 1.25 MG (50000 UNIT) PO CAPS: ORAL_CAPSULE | ORAL | Status: DC

## 2014-06-21 NOTE — Telephone Encounter (Signed)
Vit D level was low on last labs. You suggested OTC vit D. Since he's taking prescription strength now do you want to refill it?

## 2014-06-23 ENCOUNTER — Other Ambulatory Visit: Payer: Self-pay | Admitting: Nurse Practitioner

## 2014-06-24 ENCOUNTER — Other Ambulatory Visit: Payer: Self-pay | Admitting: Family

## 2014-06-24 DIAGNOSIS — F411 Generalized anxiety disorder: Secondary | ICD-10-CM

## 2014-06-24 MED ORDER — DIAZEPAM 5 MG PO TABS: 5.0000 mg | ORAL_TABLET | Freq: Two times a day (BID) | ORAL | Status: DC | PRN

## 2014-06-24 NOTE — Telephone Encounter (Signed)
Last seen 05/18/14  Kayren Eaves NP  This med is not on EPIC list   If approved route to nurse to call into [facility]

## 2014-07-02 ENCOUNTER — Other Ambulatory Visit: Payer: Self-pay | Admitting: Nurse Practitioner

## 2014-07-02 ENCOUNTER — Other Ambulatory Visit: Payer: Self-pay | Admitting: General Practice

## 2014-07-03 ENCOUNTER — Other Ambulatory Visit: Payer: Self-pay | Admitting: Nurse Practitioner

## 2014-07-04 ENCOUNTER — Encounter: Payer: Self-pay | Admitting: Family

## 2014-07-04 ENCOUNTER — Ambulatory Visit (INDEPENDENT_AMBULATORY_CARE_PROVIDER_SITE_OTHER): Payer: Medicare Other | Admitting: Family

## 2014-07-04 VITALS — BP 130/72 | HR 85 | Temp 98.5°F | Wt 226.4 lb

## 2014-07-04 DIAGNOSIS — E1021 Type 1 diabetes mellitus with diabetic nephropathy: Secondary | ICD-10-CM

## 2014-07-04 DIAGNOSIS — N058 Unspecified nephritic syndrome with other morphologic changes: Secondary | ICD-10-CM

## 2014-07-04 DIAGNOSIS — R972 Elevated prostate specific antigen [PSA]: Secondary | ICD-10-CM

## 2014-07-04 DIAGNOSIS — E785 Hyperlipidemia, unspecified: Secondary | ICD-10-CM

## 2014-07-04 DIAGNOSIS — E559 Vitamin D deficiency, unspecified: Secondary | ICD-10-CM

## 2014-07-04 DIAGNOSIS — K219 Gastro-esophageal reflux disease without esophagitis: Secondary | ICD-10-CM

## 2014-07-04 DIAGNOSIS — E1029 Type 1 diabetes mellitus with other diabetic kidney complication: Secondary | ICD-10-CM

## 2014-07-04 DIAGNOSIS — F411 Generalized anxiety disorder: Secondary | ICD-10-CM

## 2014-07-04 DIAGNOSIS — I10 Essential (primary) hypertension: Secondary | ICD-10-CM

## 2014-07-04 DIAGNOSIS — F419 Anxiety disorder, unspecified: Secondary | ICD-10-CM

## 2014-07-04 LAB — POCT UA - MICROSCOPIC ONLY
CASTS, UR, LPF, POC: NEGATIVE
Crystals, Ur, HPF, POC: NEGATIVE
Mucus, UA: NEGATIVE
WBC, Ur, HPF, POC: NEGATIVE
YEAST UA: NEGATIVE

## 2014-07-04 LAB — POCT UA - MICROALBUMIN: Microalbumin Ur, POC: 20 mg/L

## 2014-07-04 NOTE — Addendum Note (Signed)
Addended by: Earlene Plater on: 07/04/2014 10:29 AM   Modules accepted: Orders
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
## 2014-07-04 NOTE — Patient Instructions (Signed)
Health Maintenance A healthy lifestyle and preventative care can promote health and wellness.  Maintain regular health, dental, and eye exams.  Eat a healthy diet. Foods like vegetables, fruits, whole grains, low-fat dairy products, and lean protein foods contain the nutrients you need and are low in calories. Decrease your intake of foods high in solid fats, added sugars, and salt. Get information about a proper diet from your health care provider, if necessary.  Regular physical exercise is one of the most important things you can do for your health. Most adults should get at least 150 minutes of moderate-intensity exercise (any activity that increases your heart rate and causes you to sweat) each week. In addition, most adults need muscle-strengthening exercises on 2 or more days a week.   Maintain a healthy weight. The body mass index (BMI) is a screening tool to identify possible weight problems. It provides an estimate of body fat based on height and weight. Your health care provider can find your BMI and can help you achieve or maintain a healthy weight. For males 20 years and older:  A BMI below 18.5 is considered underweight.  A BMI of 18.5 to 24.9 is normal.  A BMI of 25 to 29.9 is considered overweight.  A BMI of 30 and above is considered obese.  Maintain normal blood lipids and cholesterol by exercising and minimizing your intake of saturated fat. Eat a balanced diet with plenty of fruits and vegetables. Blood tests for lipids and cholesterol should begin at age 20 and be repeated every 5 years. If your lipid or cholesterol levels are high, you are over age 50, or you are at high risk for heart disease, you may need your cholesterol levels checked more frequently.Ongoing high lipid and cholesterol levels should be treated with medicines if diet and exercise are not working.  If you smoke, find out from your health care provider how to quit. If you do not use tobacco, do not  start.  Lung cancer screening is recommended for adults aged 55-80 years who are at high risk for developing lung cancer because of a history of smoking. A yearly low-dose CT scan of the lungs is recommended for people who have at least a 30-pack-year history of smoking and are current smokers or have quit within the past 15 years. A pack year of smoking is smoking an average of 1 pack of cigarettes a day for 1 year (for example, a 30-pack-year history of smoking could mean smoking 1 pack a day for 30 years or 2 packs a day for 15 years). Yearly screening should continue until the smoker has stopped smoking for at least 15 years. Yearly screening should be stopped for people who develop a health problem that would prevent them from having lung cancer treatment.  If you choose to drink alcohol, do not have more than 2 drinks per day. One drink is considered to be 12 oz (360 mL) of beer, 5 oz (150 mL) of wine, or 1.5 oz (45 mL) of liquor.  Avoid the use of street drugs. Do not share needles with anyone. Ask for help if you need support or instructions about stopping the use of drugs.  High blood pressure causes heart disease and increases the risk of stroke. Blood pressure should be checked at least every 1-2 years. Ongoing high blood pressure should be treated with medicines if weight loss and exercise are not effective.  If you are 45-79 years old, ask your health care provider if   you should take aspirin to prevent heart disease.  Diabetes screening involves taking a blood sample to check your fasting blood sugar level. This should be done once every 3 years after age 45 if you are at a normal weight and without risk factors for diabetes. Testing should be considered at a younger age or be carried out more frequently if you are overweight and have at least 1 risk factor for diabetes.  Colorectal cancer can be detected and often prevented. Most routine colorectal cancer screening begins at the age of 50  and continues through age 75. However, your health care provider may recommend screening at an earlier age if you have risk factors for colon cancer. On a yearly basis, your health care provider may provide home test kits to check for hidden blood in the stool. A small camera at the end of a tube may be used to directly examine the colon (sigmoidoscopy or colonoscopy) to detect the earliest forms of colorectal cancer. Talk to your health care provider about this at age 50 when routine screening begins. A direct exam of the colon should be repeated every 5-10 years through age 75, unless early forms of precancerous polyps or small growths are found.  People who are at an increased risk for hepatitis B should be screened for this virus. You are considered at high risk for hepatitis B if:  You were born in a country where hepatitis B occurs often. Talk with your health care provider about which countries are considered high risk.  Your parents were born in a high-risk country and you have not received a shot to protect against hepatitis B (hepatitis B vaccine).  You have HIV or AIDS.  You use needles to inject street drugs.  You live with, or have sex with, someone who has hepatitis B.  You are a man who has sex with other men (MSM).  You get hemodialysis treatment.  You take certain medicines for conditions like cancer, organ transplantation, and autoimmune conditions.  Hepatitis C blood testing is recommended for all people born from 1945 through 1965 and any individual with known risk factors for hepatitis C.  Healthy men should no longer receive prostate-specific antigen (PSA) blood tests as part of routine cancer screening. Talk to your health care provider about prostate cancer screening.  Testicular cancer screening is not recommended for adolescents or adult males who have no symptoms. Screening includes self-exam, a health care provider exam, and other screening tests. Consult with your  health care provider about any symptoms you have or any concerns you have about testicular cancer.  Practice safe sex. Use condoms and avoid high-risk sexual practices to reduce the spread of sexually transmitted infections (STIs).  You should be screened for STIs, including gonorrhea and chlamydia if:  You are sexually active and are younger than 24 years.  You are older than 24 years, and your health care provider tells you that you are at risk for this type of infection.  Your sexual activity has changed since you were last screened, and you are at an increased risk for chlamydia or gonorrhea. Ask your health care provider if you are at risk.  If you are at risk of being infected with HIV, it is recommended that you take a prescription medicine daily to prevent HIV infection. This is called pre-exposure prophylaxis (PrEP). You are considered at risk if:  You are a man who has sex with other men (MSM).  You are a heterosexual man who   is sexually active with multiple partners.  You take drugs by injection.  You are sexually active with a partner who has HIV.  Talk with your health care provider about whether you are at high risk of being infected with HIV. If you choose to begin PrEP, you should first be tested for HIV. You should then be tested every 3 months for as long as you are taking PrEP.  Use sunscreen. Apply sunscreen liberally and repeatedly throughout the day. You should seek shade when your shadow is shorter than you. Protect yourself by wearing long sleeves, pants, a wide-brimmed hat, and sunglasses year round whenever you are outdoors.  Tell your health care provider of new moles or changes in moles, especially if there is a change in shape or color. Also, tell your health care provider if a mole is larger than the size of a pencil eraser.  A one-time screening for abdominal aortic aneurysm (AAA) and surgical repair of large AAAs by ultrasound is recommended for men aged  14-75 years who are current or former smokers.  Stay current with your vaccines (immunizations). Document Released: 06/13/2008 Document Revised: 12/21/2013 Document Reviewed: 05/13/2011 [facility] Patient Information 2015 [place], [place]. This information is not intended to replace advice given to you by your health care provider. Make sure you discuss any questions you have with your health care provider. Back Pain, Adult Low back pain is very common. About 1 in 5 people have back pain.The cause of low back pain is rarely dangerous. The pain often gets better over time.About half of people with a sudden onset of back pain feel better in just 2 weeks. About 8 in 10 people feel better by 6 weeks.  CAUSES Some common causes of back pain include:  Strain of the muscles or ligaments supporting the spine.  Wear and tear (degeneration) of the spinal discs.  Arthritis.  Direct injury to the back. DIAGNOSIS Most of the time, the direct cause of low back pain is not known.However, back pain can be treated effectively even when the exact cause of the pain is unknown.Answering your caregiver's questions about your overall health and symptoms is one of the most accurate ways to make sure the cause of your pain is not dangerous. If your caregiver needs more information, he or she may order lab work or imaging tests (X-rays or MRIs).However, even if imaging tests show changes in your back, this usually does not require surgery. HOME CARE INSTRUCTIONS For many people, back pain returns.Since low back pain is rarely dangerous, it is often a condition that people can learn to [facility] their own.   Remain active. It is stressful on the back to sit or stand in one place. Do not sit, drive, or stand in one place for more than 30 minutes at a time. Take short walks on level surfaces as soon as pain allows.Try to increase the length of time you walk each day.  Do not stay in bed.Resting more than 1 or 2  days can delay your recovery.  Do not avoid exercise or work.Your body is made to move.It is not dangerous to be active, even though your back may hurt.Your back will likely heal faster if you return to being active before your pain is gone.  Pay attention to your body when you bend and lift. Many people have less discomfortwhen lifting if they bend their knees, keep the load close to their bodies,and avoid twisting. Often, the most comfortable positions are those that put  less stress on your recovering back.  Find a comfortable position to sleep. Use a firm mattress and lie on your side with your knees slightly bent. If you lie on your back, put a pillow under your knees.  Only take over-the-counter or prescription medicines as directed by your caregiver. Over-the-counter medicines to reduce pain and inflammation are often the most helpful.Your caregiver may prescribe muscle relaxant drugs.These medicines help dull your pain so you can more quickly return to your normal activities and healthy exercise.  Put ice on the injured area.  Put ice in a plastic bag.  Place a towel between your skin and the bag.  Leave the ice on for 15-20 minutes, 03-04 times a day for the first 2 to 3 days. After that, ice and heat may be alternated to reduce pain and spasms.  Ask your caregiver about trying back exercises and gentle massage. This may be of some benefit.  Avoid feeling anxious or stressed.Stress increases muscle tension and can worsen back pain.It is important to recognize when you are anxious or stressed and learn ways to manage it.Exercise is a great option. SEEK MEDICAL CARE IF:  You have pain that is not relieved with rest or medicine.  You have pain that does not improve in 1 week.  You have new symptoms.  You are generally not feeling well. SEEK IMMEDIATE MEDICAL CARE IF:   You have pain that radiates from your back into your legs.  You develop new bowel or bladder  control problems.  You have unusual weakness or numbness in your arms or legs.  You develop nausea or vomiting.  You develop abdominal pain.  You feel faint. Document Released: 12/16/2005 Document Revised: 06/16/2012 Document Reviewed: 05/06/2011 [facility] Patient Information 2015 [place], [place]. This information is not intended to replace advice given to you by your health care provider. Make sure you discuss any questions you have with your health care provider.

## 2014-07-05 LAB — MICROALBUMIN, URINE: Microalbumin, Urine: 17 ug/mL (ref 0.0–17.0)

## 2014-07-10 ENCOUNTER — Other Ambulatory Visit: Payer: Self-pay | Admitting: Nurse Practitioner

## 2014-07-11 ENCOUNTER — Telehealth: Payer: Self-pay | Admitting: Family

## 2014-07-12 NOTE — Telephone Encounter (Signed)
Left detailed message on patient's voicemail with results. There is documentation that these results were discussed with patient at his office visit.

## 2014-07-21 ENCOUNTER — Other Ambulatory Visit: Payer: Self-pay | Admitting: Nurse Practitioner

## 2014-07-22 NOTE — Telephone Encounter (Signed)
Received electronic request for refill on protonix but according to current med list patient is taking omeprazole. Please advise on refill

## 2014-07-25 ENCOUNTER — Telehealth: Payer: Self-pay | Admitting: Family Medicine

## 2014-07-26 NOTE — Telephone Encounter (Signed)
Pt already had forms filled out per pt.

## 2014-08-02 ENCOUNTER — Other Ambulatory Visit: Payer: Self-pay | Admitting: Nurse Practitioner

## 2014-08-17 ENCOUNTER — Other Ambulatory Visit: Payer: Self-pay | Admitting: Nurse Practitioner

## 2014-08-19 ENCOUNTER — Other Ambulatory Visit: Payer: Self-pay | Admitting: Nurse Practitioner

## 2014-08-20 ENCOUNTER — Other Ambulatory Visit: Payer: Self-pay | Admitting: Nurse Practitioner

## 2014-08-23 ENCOUNTER — Other Ambulatory Visit: Payer: Self-pay | Admitting: Nurse Practitioner

## 2014-08-27 ENCOUNTER — Other Ambulatory Visit: Payer: Self-pay | Admitting: Nurse Practitioner

## 2014-09-05 ENCOUNTER — Other Ambulatory Visit: Payer: Self-pay | Admitting: Nurse Practitioner

## 2014-09-06 ENCOUNTER — Encounter: Payer: Self-pay | Admitting: Family Medicine

## 2014-09-06 ENCOUNTER — Ambulatory Visit (INDEPENDENT_AMBULATORY_CARE_PROVIDER_SITE_OTHER): Payer: Medicare Other | Admitting: Family Medicine

## 2014-09-06 VITALS — BP 160/76 | HR 96 | Temp 98.0°F | Ht 68.0 in | Wt 224.0 lb

## 2014-09-06 DIAGNOSIS — J069 Acute upper respiratory infection, unspecified: Secondary | ICD-10-CM

## 2014-09-06 MED ORDER — AMOXICILLIN 875 MG PO TABS: 875.0000 mg | ORAL_TABLET | Freq: Two times a day (BID) | ORAL | Status: DC

## 2014-09-06 MED ORDER — METHYLPREDNISOLONE ACETATE 80 MG/ML IJ SUSP
80.0000 mg | Freq: Once | INTRAMUSCULAR | Status: AC
  Administered 2014-09-06: 80 mg via INTRAMUSCULAR

## 2014-09-06 NOTE — Progress Notes (Signed)
   Subjective:    Patient ID: [name], male    DOB: 10-May-1933, 80 y.o.   MRN: [id number]  HPI This 80 y.o. male presents for evaluation of uri and sinus congestion.   Review of Systems No chest pain, SOB, HA, dizziness, vision change, N/V, diarrhea, constipation, dysuria, urinary urgency or frequency, myalgias, arthralgias or rash.     Objective:   Physical Exam  Vital signs noted  Well developed well nourished male.  HEENT - Head atraumatic Normocephalic                Eyes - PERRLA, Conjuctiva - clear Sclera- Clear EOMI                Ears - EAC's Wnl TM's Wnl Gross Hearing WNL                Nose - Nares patent                 Throat - oropharanx wnl Respiratory - Lungs CTA bilateral Cardiac - RRR S1 and S2 without murmur GI - Abdomen soft Nontender and bowel sounds active x 4 Extremities - No edema. Neuro - Grossly intact.      Assessment & Plan:  URI (upper respiratory infection) - Plan: methylPREDNISolone acetate (DEPO-MEDROL) injection 80 mg, amoxicillin (AMOXIL) 875 MG tablet  Lysbeth Penner FNP

## 2014-10-06 ENCOUNTER — Ambulatory Visit (INDEPENDENT_AMBULATORY_CARE_PROVIDER_SITE_OTHER): Payer: Medicare Other

## 2014-10-06 DIAGNOSIS — Z23 Encounter for immunization: Secondary | ICD-10-CM

## 2014-10-07 ENCOUNTER — Other Ambulatory Visit: Payer: Self-pay | Admitting: Nurse Practitioner

## 2014-10-11 ENCOUNTER — Other Ambulatory Visit: Payer: Self-pay | Admitting: Nurse Practitioner

## 2014-10-13 ENCOUNTER — Other Ambulatory Visit: Payer: Self-pay | Admitting: Family

## 2014-10-13 ENCOUNTER — Other Ambulatory Visit: Payer: Self-pay | Admitting: Family Medicine

## 2014-10-14 ENCOUNTER — Other Ambulatory Visit: Payer: Self-pay | Admitting: Nurse Practitioner

## 2014-10-17 ENCOUNTER — Other Ambulatory Visit: Payer: Self-pay | Admitting: Nurse Practitioner

## 2014-10-18 NOTE — Telephone Encounter (Signed)
Last seen 09/06/14  Last glucose 07/04/14

## 2014-10-25 ENCOUNTER — Telehealth: Payer: Self-pay | Admitting: Family

## 2014-10-25 NOTE — Telephone Encounter (Signed)
Pt was being seen by Dr.ramos at [place], pt was told they could no longer do anything for his backpain, pt has tried TENS units and it didn't help. Pt wants to be seen here and has questions regarding all of the different medications on, please advise.

## 2014-10-27 NOTE — Telephone Encounter (Signed)
I  Will be glad to see him and go over his medicatoions with him but if needs pain management we will have to refer to pain management.

## 2014-10-27 NOTE — Telephone Encounter (Signed)
lmom to schedule appt

## 2014-11-03 ENCOUNTER — Ambulatory Visit: Payer: Medicare Other | Admitting: Nurse Practitioner

## 2014-11-26 ENCOUNTER — Other Ambulatory Visit: Payer: Self-pay | Admitting: Family

## 2014-12-01 ENCOUNTER — Telehealth: Payer: Self-pay | Admitting: Pharmacist

## 2014-12-02 ENCOUNTER — Ambulatory Visit (INDEPENDENT_AMBULATORY_CARE_PROVIDER_SITE_OTHER): Payer: Medicare Other | Admitting: Pharmacist

## 2014-12-02 ENCOUNTER — Encounter: Payer: Self-pay | Admitting: Pharmacist

## 2014-12-02 VITALS — BP 180/86 | HR 75 | Ht 68.0 in | Wt 225.0 lb

## 2014-12-02 DIAGNOSIS — E559 Vitamin D deficiency, unspecified: Secondary | ICD-10-CM

## 2014-12-02 DIAGNOSIS — E119 Type 2 diabetes mellitus without complications: Secondary | ICD-10-CM

## 2014-12-02 DIAGNOSIS — E785 Hyperlipidemia, unspecified: Secondary | ICD-10-CM

## 2014-12-02 LAB — POCT GLYCOSYLATED HEMOGLOBIN (HGB A1C): Hemoglobin A1C: 6.9

## 2014-12-02 NOTE — Telephone Encounter (Signed)
Patient had appt 12/02/2014 - discussed at appt.

## 2014-12-02 NOTE — Progress Notes (Signed)
Patient ID: [name], male   DOB: 12-16-1933, 80 y.o.   MRN: [id number]  Subjective:    [name] is a 80 y.o. male who presents for follow-up of Type 2 diabetes mellitus and medication management.  Patient states that he has reached the Medicare coverage gap and has been out of several of his medications - pantoprazole, duloxetine, meloxicam, amlodipine, HCTZ.  He is about to run out of Lantus, levetiracteam and atorvastatin in the nexxt week.  The patient states that he has felt better and mood has improved since he has been out of duloxetine. Patient has spoken with someone at health department about food stamps and about medication assistance and was told he did not qualify.     Known diabetic complications: none Cardiovascular risk factors: advanced age (older than 73 for men, 68 for women), diabetes mellitus, dyslipidemia, hypertension, male gender, obesity (BMI >= 30 kg/m2) and sedentary lifestyle Current diabetic medications include oral agent (monotherapy): Glucophage 1052m 1/2 tablet BID  and insulin injections: Lantus 25 units qhs Apidra 20 units qam.  Eye exam current (within one year): unknown Weight trend: stable Current Diet - varies.  Patient is having trouble affording food. Current exercise: none  Current monitoring regimen: checks BG 1-2 times daily Home blood sugar records: 110 to 150's Any episodes of hypoglycemia? no  Is He on ACE inhibitor or angiotensin II receptor blocker?  Yes  ramipril (Altace)    The following portions of the patient's history were reviewed and updated as appropriate: allergies, current medications, past family history, past medical history, past social history, past surgical history and problem list.    Objective:    BP 180/86 mmHg  Pulse 75  Ht _0  (1.727 m)  Wt 225 lb (102.059 kg)  BMI 34.22 kg/m2  General:  alert and no distress; patient denies any GERD / reflux / heartburn since not having pantoprazole over the last 2  months.   Lab Review GLUCOSE (mg/dL)  Date Value  05/18/2014 99  10/13/2013 120*   GLUCOSE, BLD (mg/dL)  Date Value  05/19/2013 103*  03/23/2012 244*  02/27/2012 275*   CO2  Date Value  05/18/2014 23 mmol/L  10/13/2013 26 mmol/L  05/19/2013 26 mEq/L   BUN (mg/dL)  Date Value  05/18/2014 14  10/13/2013 15  05/19/2013 10  03/23/2012 15  02/27/2012 10   CREAT (mg/dL)  Date Value  05/19/2013 0.93   CREATININE, SER (mg/dL)  Date Value  05/18/2014 0.97  10/13/2013 0.92  03/23/2012 0.73   A1c = 6.9% today in office     Assessment:    Diabetes Mellitus type II, under good control.   Medication Management / noncompliance Hyperlipidemia Low serum vitamin D  Plan:    1.  Rx changes:     Discontinue pantoprazole and duloxetine  Patient given #2 samples of Toujeo to use inplace of Lantus / Levemir - inject 25 units once a day  Patient also given #21 Livalo 415msamples to take once runs out of atorvastatin.  This will last until January when patient was told by his insurance agent the he will not have zero copay in 2016.  Looking into other resources to help patient get other medications needed through the end of 2015.  2.  [facility] I do not think he will qualify since he has not had any hospitalizations in the last year.  3.  Patient is given information about community resources for food - especially  [address] [employer] where he can do to get food today 4.  Education: Reviewed 'ABCs' of diabetes management (respective goals in parentheses):  A1C (<7), blood pressure (<130/80), and cholesterol (LDL <100). 5.   Orders Placed This Encounter  Procedures  . CMP14+EGFR  . Vit D  25 hydroxy (rtn osteoporosis monitoring)  . Lipid panel  . LDL Cholesterol, Direct  . POCT glycosylated hemoglobin (Hb A1C)    RTC in 3 months to see PCP - [name], NP and 6 months to follow up with clinical pharmacist / diabetes educator  Cherre Robins, PharmD, CPP,  CDE

## 2014-12-03 LAB — LIPID PANEL
CHOL/HDL RATIO: 3.4 ratio (ref 0.0–5.0)
Cholesterol, Total: 169 mg/dL (ref 100–199)
HDL: 50 mg/dL (ref >39)
LDL Calculated: 82 mg/dL (ref 0–99)
Triglycerides: 186 mg/dL — ABNORMAL HIGH (ref 0–149)
VLDL Cholesterol Cal: 37 mg/dL (ref 5–40)

## 2014-12-03 LAB — CMP14+EGFR
A/G RATIO: 1.8 (ref 1.1–2.5)
ALT: 26 IU/L (ref 0–44)
AST: 34 IU/L (ref 0–40)
Albumin: 4.7 g/dL (ref 3.5–4.8)
Alkaline Phosphatase: 80 IU/L (ref 39–117)
BUN/Creatinine Ratio: 12 (ref 10–22)
BUN: 11 mg/dL (ref 8–27)
CO2: 23 mmol/L (ref 18–29)
Calcium: 9.9 mg/dL (ref 8.6–10.2)
Chloride: 98 mmol/L (ref 97–108)
Creatinine, Ser: 0.94 mg/dL (ref 0.76–1.27)
GFR, EST AFRICAN AMERICAN: 89 mL/min/{1.73_m2} (ref >59)
GFR, EST NON AFRICAN AMERICAN: 77 mL/min/{1.73_m2} (ref >59)
Globulin, Total: 2.6 g/dL (ref 1.5–4.5)
Glucose: 103 mg/dL — ABNORMAL HIGH (ref 65–99)
Potassium: 4.2 mmol/L (ref 3.5–5.2)
SODIUM: 141 mmol/L (ref 134–144)
Total Protein: 7.3 g/dL (ref 6.0–8.5)

## 2014-12-03 LAB — VITAMIN D 25 HYDROXY (VIT D DEFICIENCY, FRACTURES): Vit D, 25-Hydroxy: 29.3 ng/mL — ABNORMAL LOW (ref 30.0–100.0)

## 2014-12-03 LAB — LDL CHOLESTEROL, DIRECT: LDL DIRECT: 89 mg/dL (ref 0–99)

## 2014-12-05 ENCOUNTER — Other Ambulatory Visit: Payer: Self-pay | Admitting: Family Medicine

## 2014-12-05 ENCOUNTER — Other Ambulatory Visit: Payer: Self-pay | Admitting: Family

## 2014-12-05 ENCOUNTER — Other Ambulatory Visit: Payer: Self-pay | Admitting: Nurse Practitioner

## 2014-12-05 ENCOUNTER — Telehealth: Payer: Self-pay | Admitting: Pharmacist

## 2014-12-05 ENCOUNTER — Telehealth: Payer: Self-pay | Admitting: Family

## 2014-12-05 MED ORDER — MELOXICAM 15 MG PO TABS: 15.0000 mg | ORAL_TABLET | Freq: Every day | ORAL | Status: DC

## 2014-12-05 MED ORDER — LEVETIRACETAM 250 MG PO TABS
250.0000 mg | ORAL_TABLET | Freq: Two times a day (BID) | ORAL | Status: DC
Start: 2014-12-05 — End: 2016-03-08

## 2014-12-05 MED ORDER — HYDROCHLOROTHIAZIDE 12.5 MG PO CAPS: 12.5000 mg | ORAL_CAPSULE | Freq: Every day | ORAL | Status: DC

## 2014-12-05 NOTE — Telephone Encounter (Signed)
BG/ BMET and LFT = WNL Vitmain D low but improved - patient has been taking 50,000 IU weekly - OK to switch to 1000IU Daily LDL and HDL at goal. Tg elevated but improved from 6 months ago. Continue to limit high CHO containing foods and exercise as able.  Patient notified of labs.  I also checked and patient was able to get food from [id number].  I also was able to locate a pharmacy that would fill meloxicam, levetiracetam and HCTZ for $22 total.  Patient is agreeable to this and will pick up Wednesday, December 9th.

## 2014-12-05 NOTE — Telephone Encounter (Signed)
Patient called - he may switch to [facility] but I am not sure is copay in 2016 would be zero - this might only be an offer his insurance gives if he uses mail order.  Patient to check with [facility] in Januray 2016.

## 2014-12-06 NOTE — Telephone Encounter (Signed)
Pt requesting refill on Valium 5mg  take 1 po bid prn, last seen 09/06/14,last ordered 06/24/14 #60 with 3 refills. Ok to refill? Please advise.

## 2014-12-07 ENCOUNTER — Telehealth: Payer: Self-pay | Admitting: *Deleted

## 2014-12-07 ENCOUNTER — Other Ambulatory Visit: Payer: Self-pay | Admitting: Family Medicine

## 2014-12-07 ENCOUNTER — Other Ambulatory Visit: Payer: Self-pay | Admitting: Family

## 2014-12-07 NOTE — Telephone Encounter (Signed)
Script for [facility] called to [facility].

## 2014-12-07 NOTE — Telephone Encounter (Signed)
Please call in valium with 1 refills 

## 2014-12-08 NOTE — Telephone Encounter (Signed)
rx was called in per Mary Sella.

## 2014-12-15 ENCOUNTER — Telehealth: Payer: Self-pay | Admitting: Pharmacist

## 2014-12-15 NOTE — Telephone Encounter (Signed)
Tried to call patient back - no ans/ left message on VM

## 2014-12-15 NOTE — Telephone Encounter (Signed)
Patient received letter to see if he wanted knee or back brace.  He states that he does not need these.  He has contacted [employer] about this. He has not been having any trouble with medications but he was contacted by insurance company to verify medications.

## 2014-12-24 ENCOUNTER — Other Ambulatory Visit: Payer: Self-pay | Admitting: Nurse Practitioner

## 2014-12-26 ENCOUNTER — Other Ambulatory Visit: Payer: Self-pay | Admitting: Nurse Practitioner

## 2014-12-27 ENCOUNTER — Telehealth: Payer: Self-pay | Admitting: Family Medicine

## 2014-12-27 NOTE — Telephone Encounter (Signed)
Aware, one levimer pen sample available.

## 2014-12-28 ENCOUNTER — Other Ambulatory Visit: Payer: Self-pay | Admitting: Nurse Practitioner

## 2014-12-29 ENCOUNTER — Other Ambulatory Visit: Payer: Self-pay | Admitting: Family Medicine

## 2015-01-02 ENCOUNTER — Telehealth: Payer: Self-pay | Admitting: Family Medicine

## 2015-01-02 DIAGNOSIS — Z0289 Encounter for other administrative examinations: Secondary | ICD-10-CM

## 2015-01-03 ENCOUNTER — Other Ambulatory Visit: Payer: Self-pay | Admitting: Family Medicine

## 2015-01-03 ENCOUNTER — Telehealth: Payer: Self-pay | Admitting: Family Medicine

## 2015-01-03 NOTE — Telephone Encounter (Signed)
Patient was told earlier today that there are no samples of insulin or diabetic medication to give him.

## 2015-01-03 NOTE — Telephone Encounter (Signed)
Aware, no insulin samples. 

## 2015-01-03 NOTE — Telephone Encounter (Signed)
Left detailed message that we don't have any samples of lantus. This is the third call from pt today regarding this.

## 2015-01-04 MED ORDER — INSULIN GLARGINE 300 UNIT/ML ~~LOC~~ SOPN: 66.0000 [IU] | PEN_INJECTOR | Freq: Every day | SUBCUTANEOUS | Status: DC

## 2015-01-04 NOTE — Addendum Note (Signed)
Addended by: Cherre Robins on: 01/04/2015 02:36 PM   Modules accepted: Orders

## 2015-01-04 NOTE — Telephone Encounter (Signed)
I was able to get #2 pens of Toujeo samples - patient was instructed to inject 66 units - same as Lantus or Levemir.  Patient aware samples are available.

## 2015-01-16 ENCOUNTER — Other Ambulatory Visit: Payer: Self-pay | Admitting: Family

## 2015-01-18 ENCOUNTER — Ambulatory Visit: Payer: No Typology Code available for payment source | Admitting: Gastroenterology

## 2015-01-19 ENCOUNTER — Other Ambulatory Visit: Payer: Self-pay | Admitting: Nurse Practitioner

## 2015-01-21 NOTE — Telephone Encounter (Signed)
Last filled 12/07/14, last seen for anxiety 07/04/14. Route to pool A if approved, [facility]

## 2015-01-23 NOTE — Telephone Encounter (Signed)
rx called into pharmacy

## 2015-01-25 ENCOUNTER — Telehealth: Payer: Self-pay | Admitting: Family Medicine

## 2015-01-25 ENCOUNTER — Telehealth: Payer: Self-pay | Admitting: Pharmacist

## 2015-01-25 MED ORDER — INSULIN GLARGINE 300 UNIT/ML ~~LOC~~ SOPN
66.0000 [IU] | PEN_INJECTOR | Freq: Every day | SUBCUTANEOUS | Status: DC
Start: 2015-01-25 — End: 2015-02-10

## 2015-01-25 NOTE — Telephone Encounter (Signed)
Patient aware that samples are in refrigerator for him to pick up of toujeo

## 2015-01-26 ENCOUNTER — Other Ambulatory Visit: Payer: Self-pay | Admitting: Family

## 2015-01-26 ENCOUNTER — Other Ambulatory Visit: Payer: Self-pay | Admitting: Nurse Practitioner

## 2015-01-26 NOTE — Telephone Encounter (Signed)
Called patient.  He is upset because he is out of most of his medications and cannot afford to purchase the ones that are ready for [facility].   [facility] - [facility] 2 rx's for him currently - pen needles with a copay of $40; diazepam with a copay of $6.60. I will look into community resources that might help with medications.

## 2015-01-27 ENCOUNTER — Other Ambulatory Visit: Payer: Self-pay | Admitting: Family

## 2015-01-27 NOTE — Telephone Encounter (Signed)
Was able to find $40 in community assistance for patient's medications.  [facility] and ready to pick up.  I also found #11 pen needles for patient - left at front desk for pick up.

## 2015-02-09 ENCOUNTER — Telehealth: Payer: Self-pay | Admitting: Pharmacist

## 2015-02-10 MED ORDER — INSULIN GLARGINE 300 UNIT/ML ~~LOC~~ SOPN: 66.0000 [IU] | PEN_INJECTOR | Freq: Every day | SUBCUTANEOUS | Status: DC

## 2015-02-10 NOTE — Telephone Encounter (Signed)
#  1 samples of Toujeo and pen needles left in refrig for patient.  Patient notified.

## 2015-02-26 ENCOUNTER — Other Ambulatory Visit: Payer: Self-pay | Admitting: Family Medicine

## 2015-02-26 ENCOUNTER — Other Ambulatory Visit: Payer: Self-pay | Admitting: Family

## 2015-02-27 ENCOUNTER — Telehealth: Payer: Self-pay | Admitting: Family Medicine

## 2015-02-27 MED ORDER — INSULIN GLARGINE 300 UNIT/ML ~~LOC~~ SOPN
66.0000 [IU] | PEN_INJECTOR | Freq: Every day | SUBCUTANEOUS | Status: DC
Start: 2015-02-27 — End: 2015-03-16

## 2015-02-27 NOTE — Telephone Encounter (Signed)
1 sample of Toujeo left for patinet - patient notified.

## 2015-02-28 ENCOUNTER — Other Ambulatory Visit: Payer: Self-pay | Admitting: Family

## 2015-02-28 ENCOUNTER — Other Ambulatory Visit: Payer: Self-pay | Admitting: Family Medicine

## 2015-03-06 ENCOUNTER — Other Ambulatory Visit: Payer: Self-pay | Admitting: Family

## 2015-03-06 ENCOUNTER — Other Ambulatory Visit: Payer: Self-pay | Admitting: Family Medicine

## 2015-03-07 ENCOUNTER — Telehealth: Payer: Self-pay | Admitting: Family

## 2015-03-07 NOTE — Telephone Encounter (Signed)
Please advise on refill.  Last seen 09/03/14 by Dietrich Pates.  Has appointment with Dr. Sabra Heck 04/13/15.

## 2015-03-08 NOTE — Telephone Encounter (Signed)
[facility]ed [facility] refill okayed per [facility]

## 2015-03-08 NOTE — Telephone Encounter (Signed)
Left message no samples available  

## 2015-03-09 ENCOUNTER — Ambulatory Visit: Payer: Self-pay | Admitting: Family

## 2015-03-15 ENCOUNTER — Telehealth: Payer: Self-pay | Admitting: Family Medicine

## 2015-03-16 MED ORDER — INSULIN GLARGINE 300 UNIT/ML ~~LOC~~ SOPN: 66.0000 [IU] | PEN_INJECTOR | Freq: Every day | SUBCUTANEOUS | Status: DC

## 2015-03-16 NOTE — Telephone Encounter (Signed)
#  2 toujeo samples left for patient.   Patient notified.

## 2015-04-05 ENCOUNTER — Telehealth: Payer: Self-pay | Admitting: Pharmacist

## 2015-04-05 ENCOUNTER — Other Ambulatory Visit: Payer: Self-pay | Admitting: Family

## 2015-04-05 NOTE — Telephone Encounter (Signed)
#  2 Toujeo samples left for  patient in fridge.  Tried to call  - no answer.

## 2015-04-13 ENCOUNTER — Ambulatory Visit (INDEPENDENT_AMBULATORY_CARE_PROVIDER_SITE_OTHER): Payer: Medicare Other | Admitting: Family Medicine

## 2015-04-13 ENCOUNTER — Encounter: Payer: Self-pay | Admitting: Family Medicine

## 2015-04-13 VITALS — BP 159/69 | HR 98 | Temp 97.6°F | Ht 68.0 in | Wt 231.0 lb

## 2015-04-13 DIAGNOSIS — E559 Vitamin D deficiency, unspecified: Secondary | ICD-10-CM | POA: Diagnosis not present

## 2015-04-13 DIAGNOSIS — Z125 Encounter for screening for malignant neoplasm of prostate: Secondary | ICD-10-CM

## 2015-04-13 DIAGNOSIS — I10 Essential (primary) hypertension: Secondary | ICD-10-CM | POA: Diagnosis not present

## 2015-04-13 DIAGNOSIS — E119 Type 2 diabetes mellitus without complications: Secondary | ICD-10-CM

## 2015-04-13 DIAGNOSIS — K219 Gastro-esophageal reflux disease without esophagitis: Secondary | ICD-10-CM | POA: Diagnosis not present

## 2015-04-13 DIAGNOSIS — E785 Hyperlipidemia, unspecified: Secondary | ICD-10-CM

## 2015-04-13 LAB — POCT GLYCOSYLATED HEMOGLOBIN (HGB A1C): Hemoglobin A1C: 6.5

## 2015-04-13 MED ORDER — OXYCODONE-ACETAMINOPHEN 5-325 MG PO TABS: 1.0000 | ORAL_TABLET | Freq: Two times a day (BID) | ORAL | Status: DC | PRN

## 2015-04-13 NOTE — Patient Instructions (Signed)
Medicare Annual Wellness Visit  [place] and the medical providers at [facility] strive to bring you the best medical care.  In doing so we not only want to address your current medical conditions and concerns but also to detect new conditions early and prevent illness, disease and health-related problems.    Medicare offers a yearly Wellness Visit which allows our clinical staff to assess your need for preventative services including immunizations, lifestyle education, counseling to decrease risk of preventable diseases and screening for fall risk and other medical concerns.    This visit is provided free of charge (no copay) for all Medicare recipients. The clinical pharmacists at [facility] have begun to conduct these Wellness Visits which will also include a thorough review of all your medications.    As you primary medical provider recommend that you make an appointment for your Annual Wellness Visit if you have not done so already this year.  You may set up this appointment before you leave today or you may call back [phone]) and schedule an appointment.  Please make sure when you call that you mention that you are scheduling your Annual Wellness Visit with the clinical pharmacist so that the appointment may be made for the proper length of time.     Continue current medications. Continue good therapeutic lifestyle changes which include good diet and exercise. Fall precautions discussed with patient. If an FOBT was given today- please return it to our front desk. If you are over 50 years old - you may need Prevnar 13 or the adult Pneumonia vaccine.  Flu Shots are still available at our office. If you still haven't had one please call to set up a nurse visit to get one.   After your visit with us today you will receive a survey in the mail or online from [employer] regarding your care with us. Please take a moment to  fill this out. Your feedback is very important to us as you can help us better understand your patient needs as well as improve your experience and satisfaction. WE CARE ABOUT YOU!!!   
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
## 2015-04-14 ENCOUNTER — Telehealth: Payer: Self-pay | Admitting: *Deleted

## 2015-04-14 LAB — PSA, TOTAL AND FREE
PSA, Free Pct: 24.9 %
PSA, Free: 1.12 ng/mL
PSA: 4.5 ng/mL — ABNORMAL HIGH (ref 0.0–4.0)

## 2015-04-14 NOTE — Telephone Encounter (Signed)
-----   Message from Wardell Honour, MD sent at 04/13/2015  1:48 PM EDT ----- Hemoglobin A1c is good and at goal. No changes are recommended

## 2015-04-14 NOTE — Telephone Encounter (Signed)
lmtcb regarding test results. 

## 2015-04-19 ENCOUNTER — Telehealth: Payer: Self-pay | Admitting: Family Medicine

## 2015-04-19 NOTE — Telephone Encounter (Signed)
Printed and put in mail

## 2015-04-21 ENCOUNTER — Telehealth: Payer: Self-pay | Admitting: Pharmacist

## 2015-04-24 ENCOUNTER — Telehealth: Payer: Self-pay | Admitting: Family

## 2015-04-24 MED ORDER — INSULIN GLARGINE 300 UNIT/ML ~~LOC~~ SOPN: 66.0000 [IU] | PEN_INJECTOR | Freq: Every day | SUBCUTANEOUS | Status: DC

## 2015-04-24 NOTE — Telephone Encounter (Signed)
Samples already signed out by TBE

## 2015-04-24 NOTE — Telephone Encounter (Signed)
Patient notified the Toujeo #2 samples in refrig.

## 2015-04-26 ENCOUNTER — Other Ambulatory Visit: Payer: Self-pay | Admitting: Family

## 2015-04-27 ENCOUNTER — Other Ambulatory Visit: Payer: Self-pay | Admitting: Nurse Practitioner

## 2015-05-01 ENCOUNTER — Other Ambulatory Visit: Payer: Self-pay | Admitting: Family Medicine

## 2015-05-02 ENCOUNTER — Other Ambulatory Visit: Payer: Self-pay

## 2015-05-02 MED ORDER — OXYCODONE-ACETAMINOPHEN 5-325 MG PO TABS: 1.0000 | ORAL_TABLET | Freq: Two times a day (BID) | ORAL | Status: DC | PRN

## 2015-05-02 NOTE — Telephone Encounter (Signed)
Lm for patient about what mail order company he uses

## 2015-05-02 NOTE — Telephone Encounter (Signed)
Patient aware rx up front to pick up 

## 2015-05-04 ENCOUNTER — Telehealth: Payer: Self-pay | Admitting: Family Medicine

## 2015-05-04 NOTE — Telephone Encounter (Signed)
Left detailed mssg, needing to know if pt is wanting a sample of the symbicort or if he is needing a refill sent to a mail order pharmacy & if so which mail order pharmacy do we need to send this too.

## 2015-05-04 NOTE — Telephone Encounter (Signed)
Stp and advised we don't have any samples of the symbicort.

## 2015-05-07 ENCOUNTER — Other Ambulatory Visit: Payer: Self-pay | Admitting: Family Medicine

## 2015-05-08 ENCOUNTER — Telehealth: Payer: Self-pay | Admitting: Pharmacist

## 2015-05-08 MED ORDER — BUDESONIDE-FORMOTEROL FUMARATE 160-4.5 MCG/ACT IN AERO
2.0000 | INHALATION_SPRAY | Freq: Two times a day (BID) | RESPIRATORY_TRACT | Status: DC
Start: 2015-05-08 — End: 2016-03-08

## 2015-05-08 MED ORDER — INSULIN GLARGINE 300 UNIT/ML ~~LOC~~ SOPN: 66.0000 [IU] | PEN_INJECTOR | Freq: Every day | SUBCUTANEOUS | Status: DC

## 2015-05-08 NOTE — Telephone Encounter (Signed)
Form filled out and faxed back

## 2015-05-08 NOTE — Telephone Encounter (Signed)
Patient asked about symbicort patient assistance.  He states that paperwork was faxed to office this am.  I checked with Barnett Applebaum and she filled out paperwork today.  I left #1 sample of Symbicort left for patient.  Also left Toujeo #1 today.

## 2015-05-09 NOTE — Telephone Encounter (Signed)
Please advise on refill- last seen 04/13/15.  Last CMP was 11/2014.

## 2015-05-10 ENCOUNTER — Other Ambulatory Visit: Payer: Self-pay | Admitting: Family Medicine

## 2015-05-10 DIAGNOSIS — H4089 Other specified glaucoma: Secondary | ICD-10-CM | POA: Diagnosis not present

## 2015-05-10 DIAGNOSIS — H2512 Age-related nuclear cataract, left eye: Secondary | ICD-10-CM | POA: Diagnosis not present

## 2015-05-10 DIAGNOSIS — H40012 Open angle with borderline findings, low risk, left eye: Secondary | ICD-10-CM | POA: Diagnosis not present

## 2015-05-10 DIAGNOSIS — H04123 Dry eye syndrome of bilateral lacrimal glands: Secondary | ICD-10-CM | POA: Diagnosis not present

## 2015-05-10 LAB — HM DIABETES EYE EXAM

## 2015-05-12 ENCOUNTER — Telehealth: Payer: Self-pay | Admitting: Family Medicine

## 2015-05-15 NOTE — Telephone Encounter (Signed)
This was resent on 05/09/15. Pt aware.

## 2015-05-16 ENCOUNTER — Other Ambulatory Visit: Payer: Self-pay | Admitting: Family Medicine

## 2015-05-17 ENCOUNTER — Other Ambulatory Visit: Payer: Self-pay | Admitting: Family Medicine

## 2015-05-18 ENCOUNTER — Telehealth: Payer: Self-pay | Admitting: Family Medicine

## 2015-05-18 MED ORDER — OXYCODONE-ACETAMINOPHEN 5-325 MG PO TABS: 1.0000 | ORAL_TABLET | Freq: Two times a day (BID) | ORAL | Status: DC | PRN

## 2015-05-18 NOTE — Telephone Encounter (Signed)
Please review and advise.

## 2015-05-19 ENCOUNTER — Other Ambulatory Visit: Payer: Self-pay | Admitting: Family

## 2015-05-19 NOTE — Telephone Encounter (Signed)
No samples available Pt notified

## 2015-05-24 ENCOUNTER — Ambulatory Visit (INDEPENDENT_AMBULATORY_CARE_PROVIDER_SITE_OTHER): Payer: Medicare Other | Admitting: Pharmacist

## 2015-05-24 ENCOUNTER — Encounter: Payer: Self-pay | Admitting: Pharmacist

## 2015-05-24 ENCOUNTER — Telehealth: Payer: Self-pay | Admitting: Family

## 2015-05-24 VITALS — BP 138/60 | HR 67 | Ht 68.0 in | Wt 232.0 lb

## 2015-05-24 DIAGNOSIS — Z0000 Encounter for general adult medical examination without abnormal findings: Secondary | ICD-10-CM

## 2015-05-24 DIAGNOSIS — E119 Type 2 diabetes mellitus without complications: Secondary | ICD-10-CM

## 2015-05-24 DIAGNOSIS — Z23 Encounter for immunization: Secondary | ICD-10-CM | POA: Diagnosis not present

## 2015-05-24 DIAGNOSIS — Z794 Long term (current) use of insulin: Secondary | ICD-10-CM

## 2015-05-24 MED ORDER — INSULIN GLARGINE 300 UNIT/ML ~~LOC~~ SOPN: 66.0000 [IU] | PEN_INJECTOR | Freq: Every day | SUBCUTANEOUS | Status: DC

## 2015-05-24 NOTE — Telephone Encounter (Signed)
Resent to pharmacy and patient notified.

## 2015-05-24 NOTE — Patient Instructions (Signed)
[name] , Thank you for taking time to come for your Medicare Wellness Visit. I appreciate your ongoing commitment to your health goals. Please review the following plan we discussed and let me know if I can assist you in the future.    This is a list of the screening recommended for you and due dates:  Health Maintenance  Topic Date Due  . Colon Cancer Screening  Patient declined fecal occult / stool test  . Eye exam for diabetics  Will request last visit notes from Dr Katy Fitch  . Shingles Vaccine  Delayed due to cost  . Tetanus Vaccine  Delayed due to cost  . Pneumonia vaccines (2 of 2 - PCV13) Completed - Prevnar 13 given in office today  . Urine Protein Check  07/05/2015  . Flu Shot  07/31/2015  . Hemoglobin A1C  10/13/2015  . Complete foot exam   04/12/2016  *Topic was postponed. The date shown is not the original due date.   Preventive Care for Adults A healthy lifestyle and preventive care can promote health and wellness. Preventive health guidelines for men include the following key practices:  A routine yearly physical is a good way to check with your health care provider about your health and preventative screening. It is a chance to share any concerns and updates on your health and to receive a thorough exam.  Visit your dentist for a routine exam and preventative care every 6 months. Brush your teeth twice a day and floss once a day. Good oral hygiene prevents tooth decay and gum disease.  The frequency of eye exams is based on your age, health, family medical history, use of contact lenses, and other factors. Follow your health care provider's recommendations for frequency of eye exams.  Eat a healthy diet. Foods such as vegetables, fruits, whole grains, low-fat dairy products, and lean protein foods contain the nutrients you need without too many calories. Decrease your intake of foods high in solid fats, added sugars, and salt. Eat the right amount of calories for you.Get  information about a proper diet from your health care provider, if necessary.  Regular physical exercise is one of the most important things you can do for your health. Most adults should get at least 150 minutes of moderate-intensity exercise (any activity that increases your heart rate and causes you to sweat) each week. In addition, most adults need muscle-strengthening exercises on 2 or more days a week.  Maintain a healthy weight. The body mass index (BMI) is a screening tool to identify possible weight problems. It provides an estimate of body fat based on height and weight. Your health care provider can find your BMI and can help you achieve or maintain a healthy weight.For adults 20 years and older:  A BMI below 18.5 is considered underweight.  A BMI of 18.5 to 24.9 is normal.  A BMI of 25 to 29.9 is considered overweight.  A BMI of 30 and above is considered obese.  Maintain normal blood lipids and cholesterol levels by exercising and minimizing your intake of saturated fat. Eat a balanced diet with plenty of fruit and vegetables. Blood tests for lipids and cholesterol should begin at age 35 and be repeated every 5 years. If your lipid or cholesterol levels are high, you are over 50, or you are at high risk for heart disease, you may need your cholesterol levels checked more frequently.Ongoing high lipid and cholesterol levels should be treated with medicines if diet and  exercise are not working.  If you smoke, find out from your health care provider how to quit. If you do not use tobacco, do not start.  Lung cancer screening is recommended for adults aged 33-80 years who are at high risk for developing lung cancer because of a history of smoking. A yearly low-dose CT scan of the lungs is recommended for people who have at least a 30-pack-year history of smoking and are a current smoker or have quit within the past 15 years. A pack year of smoking is smoking an average of 1 pack of  cigarettes a day for 1 year (for example: 1 pack a day for 30 years or 2 packs a day for 15 years). Yearly screening should continue until the smoker has stopped smoking for at least 15 years. Yearly screening should be stopped for people who develop a health problem that would prevent them from having lung cancer treatment.  If you choose to drink alcohol, do not have more than 2 drinks per day. One drink is considered to be 12 ounces (355 mL) of beer, 5 ounces (148 mL) of wine, or 1.5 ounces (44 mL) of liquor.  Avoid use of street drugs. Do not share needles with anyone. Ask for help if you need support or instructions about stopping the use of drugs.  High blood pressure causes heart disease and increases the risk of stroke. Your blood pressure should be checked at least every 1-2 years. Ongoing high blood pressure should be treated with medicines, if weight loss and exercise are not effective.  If you are 39-7 years old, ask your health care provider if you should take aspirin to prevent heart disease.  Diabetes screening involves taking a blood sample to check your fasting blood sugar level. This should be done once every 3 years, after age 7, if you are within normal weight and without risk factors for diabetes. Testing should be considered at a younger age or be carried out more frequently if you are overweight and have at least 1 risk factor for diabetes.  Colorectal cancer can be detected and often prevented. Most routine colorectal cancer screening begins at the age of 76 and continues through age 62. However, your health care provider may recommend screening at an earlier age if you have risk factors for colon cancer. On a yearly basis, your health care provider may provide home test kits to check for hidden blood in the stool. Use of a small camera at the end of a tube to directly examine the colon (sigmoidoscopy or colonoscopy) can detect the earliest forms of colorectal cancer. Talk to  your health care provider about this at age 66, when routine screening begins. Direct exam of the colon should be repeated every 5-10 years through age 84, unless early forms of precancerous polyps or small growths are found.  People who are at an increased risk for hepatitis B should be screened for this virus. You are considered at high risk for hepatitis B if:  You were born in a country where hepatitis B occurs often. Talk with your health care provider about which countries are considered high risk.  Your parents were born in a high-risk country and you have not received a shot to protect against hepatitis B (hepatitis B vaccine).  You have HIV or AIDS.  You use needles to inject street drugs.  You live with, or have sex with, someone who has hepatitis B.  You are a man who has sex  with other men (MSM).  You get hemodialysis treatment.  You take certain medicines for conditions such as cancer, organ transplantation, and autoimmune conditions.  Hepatitis C blood testing is recommended for all people born from [birth year] through 1965 and any individual with known risks for hepatitis C.  Practice safe sex. Use condoms and avoid high-risk sexual practices to reduce the spread of sexually transmitted infections (STIs). STIs include gonorrhea, chlamydia, syphilis, trichomonas, herpes, HPV, and human immunodeficiency virus (HIV). Herpes, HIV, and HPV are viral illnesses that have no cure. They can result in disability, cancer, and death.  If you are at risk of being infected with HIV, it is recommended that you take a prescription medicine daily to prevent HIV infection. This is called preexposure prophylaxis (PrEP). You are considered at risk if:  You are a man who has sex with other men (MSM) and have other risk factors.  You are a heterosexual man, are sexually active, and are at increased risk for HIV infection.  You take drugs by injection.  You are sexually active with a partner who has  HIV.  Talk with your health care provider about whether you are at high risk of being infected with HIV. If you choose to begin PrEP, you should first be tested for HIV. You should then be tested every 3 months for as long as you are taking PrEP.  A one-time screening for abdominal aortic aneurysm (AAA) and surgical repair of large AAAs by ultrasound are recommended for men ages 47 to 65 years who are current or former smokers.  Healthy men should no longer receive prostate-specific antigen (PSA) blood tests as part of routine cancer screening. Talk with your health care provider about prostate cancer screening.  Testicular cancer screening is not recommended for adult males who have no symptoms. Screening includes self-exam, a health care provider exam, and other screening tests. Consult with your health care provider about any symptoms you have or any concerns you have about testicular cancer.  Use sunscreen. Apply sunscreen liberally and repeatedly throughout the day. You should seek shade when your shadow is shorter than you. Protect yourself by wearing long sleeves, pants, a wide-brimmed hat, and sunglasses year round, whenever you are outdoors.  Once a month, do a whole-body skin exam, using a mirror to look at the skin on your back. Tell your health care provider about new moles, moles that have irregular borders, moles that are larger than a pencil eraser, or moles that have changed in shape or color.  Stay current with required vaccines (immunizations).  Influenza vaccine. All adults should be immunized every year.  Tetanus, diphtheria, and acellular pertussis (Td, Tdap) vaccine. An adult who has not previously received Tdap or who does not know his vaccine status should receive 1 dose of Tdap. This initial dose should be followed by tetanus and diphtheria toxoids (Td) booster doses every 10 years. Adults with an unknown or incomplete history of completing a 3-dose immunization series with  Td-containing vaccines should begin or complete a primary immunization series including a Tdap dose. Adults should receive a Td booster every 10 years.  Varicella vaccine. An adult without evidence of immunity to varicella should receive 2 doses or a second dose if he has previously received 1 dose.  Human papillomavirus (HPV) vaccine. Males aged 57-21 years who have not received the vaccine previously should receive the 3-dose series. Males aged 22-26 years may be immunized. Immunization is recommended through the age of 36 years for any  male who has sex with males and did not get any or all doses earlier. Immunization is recommended for any person with an immunocompromised condition through the age of 48 years if he did not get any or all doses earlier. During the 3-dose series, the second dose should be obtained 4-8 weeks after the first dose. The third dose should be obtained 24 weeks after the first dose and 16 weeks after the second dose.  Zoster vaccine. One dose is recommended for adults aged 107 years or older unless certain conditions are present.  Measles, mumps, and rubella (MMR) vaccine. Adults born before [birth year] generally are considered immune to measles and mumps. Adults born in [birth year] or later should have 1 or more doses of MMR vaccine unless there is a contraindication to the vaccine or there is laboratory evidence of immunity to each of the three diseases. A routine second dose of MMR vaccine should be obtained at least 28 days after the first dose for students attending postsecondary schools, health care workers, or international travelers. People who received inactivated measles vaccine or an unknown type of measles vaccine during 1963-1967 should receive 2 doses of MMR vaccine. People who received inactivated mumps vaccine or an unknown type of mumps vaccine before 1979 and are at high risk for mumps infection should consider immunization with 2 doses of MMR vaccine. Unvaccinated health care  workers born before [birth year] who lack laboratory evidence of measles, mumps, or rubella immunity or laboratory confirmation of disease should consider measles and mumps immunization with 2 doses of MMR vaccine or rubella immunization with 1 dose of MMR vaccine.  Pneumococcal 13-valent conjugate (PCV13) vaccine. When indicated, a person who is uncertain of his immunization history and has no record of immunization should receive the PCV13 vaccine. An adult aged 15 years or older who has certain medical conditions and has not been previously immunized should receive 1 dose of PCV13 vaccine. This PCV13 should be followed with a dose of pneumococcal polysaccharide (PPSV23) vaccine. The PPSV23 vaccine dose should be obtained at least 8 weeks after the dose of PCV13 vaccine. An adult aged 57 years or older who has certain medical conditions and previously received 1 or more doses of PPSV23 vaccine should receive 1 dose of PCV13. The PCV13 vaccine dose should be obtained 1 or more years after the last PPSV23 vaccine dose.  Pneumococcal polysaccharide (PPSV23) vaccine. When PCV13 is also indicated, PCV13 should be obtained first. All adults aged 60 years and older should be immunized. An adult younger than age 65 years who has certain medical conditions should be immunized. Any person who resides in a nursing home or long-term care facility should be immunized. An adult smoker should be immunized. People with an immunocompromised condition and certain other conditions should receive both PCV13 and PPSV23 vaccines. People with human immunodeficiency virus (HIV) infection should be immunized as soon as possible after diagnosis. Immunization during chemotherapy or radiation therapy should be avoided. Routine use of PPSV23 vaccine is not recommended for American Indians, [place] Natives, or people younger than 65 years unless there are medical conditions that require PPSV23 vaccine. When indicated, people who have unknown  immunization and have no record of immunization should receive PPSV23 vaccine. One-time revaccination 5 years after the first dose of PPSV23 is recommended for people aged 19-64 years who have chronic kidney failure, nephrotic syndrome, asplenia, or immunocompromised conditions. People who received 1-2 doses of PPSV23 before age 7 years should receive another dose of PPSV23 vaccine at  age 61 years or later if at least 5 years have passed since the previous dose. Doses of PPSV23 are not needed for people immunized with PPSV23 at or after age 103 years.  Meningococcal vaccine. Adults with asplenia or persistent complement component deficiencies should receive 2 doses of quadrivalent meningococcal conjugate (MenACWY-D) vaccine. The doses should be obtained at least 2 months apart. Microbiologists working with certain meningococcal bacteria, [employer] recruits, people at risk during an outbreak, and people who travel to or live in countries with a high rate of meningitis should be immunized. A first-year college student up through age 52 years who is living in a residence hall should receive a dose if he did not receive a dose on or after his 16th birthday. Adults who have certain high-risk conditions should receive one or more doses of vaccine.  Hepatitis A vaccine. Adults who wish to be protected from this disease, have certain high-risk conditions, work with hepatitis A-infected animals, work in hepatitis A research labs, or travel to or work in countries with a high rate of hepatitis A should be immunized. Adults who were previously unvaccinated and who anticipate close contact with an international adoptee during the first 60 days after arrival in the [place] from a country with a high rate of hepatitis A should be immunized.  Hepatitis B vaccine. Adults should be immunized if they wish to be protected from this disease, have certain high-risk conditions, may be exposed to blood or other infectious body  fluids, are household contacts or sex partners of hepatitis B positive people, are clients or workers in certain care facilities, or travel to or work in countries with a high rate of hepatitis B.  Haemophilus influenzae type b (Hib) vaccine. A previously unvaccinated person with asplenia or sickle cell disease or having a scheduled splenectomy should receive 1 dose of Hib vaccine. Regardless of previous immunization, a recipient of a hematopoietic stem cell transplant should receive a 3-dose series 6-12 months after his successful transplant. Hib vaccine is not recommended for adults with HIV infection. Preventive Service / Frequency Ages 13 and over  Blood pressure check.** / Every 1 to 2 years.  Lipid and cholesterol check.**/ Every 5 years beginning at age 93.  Lung cancer screening. / Every year if you are aged 68-80 years and have a 30-pack-year history of smoking and currently smoke or have quit within the past 15 years. Yearly screening is stopped once you have quit smoking for at least 15 years or develop a health problem that would prevent you from having lung cancer treatment.  Fecal occult blood test (FOBT) of stool. / Every year beginning at age 51 and continuing until age 24. You may not have to do this test if you get a colonoscopy every 10 years.  Flexible sigmoidoscopy** or colonoscopy.** / Every 5 years for a flexible sigmoidoscopy or every 10 years for a colonoscopy beginning at age 88 and continuing until age 36.  Hepatitis C blood test.** / For all people born from [birth year] through 1965 and any individual with known risks for hepatitis C.  Abdominal aortic aneurysm (AAA) screening.** / A one-time screening for ages 16 to 9 years who are current or former smokers.  Skin self-exam. / Monthly.  Influenza vaccine. / Every year.  Tetanus, diphtheria, and acellular pertussis (Tdap/Td) vaccine.** / 1 dose of Td every 10 years.  Varicella vaccine.** / Consult your health care  provider.  Zoster vaccine.** / 1 dose for adults aged 40 years  or older.  Pneumococcal 13-valent conjugate (PCV13) vaccine.** / Consult your health care provider.  Pneumococcal polysaccharide (PPSV23) vaccine.** / 1 dose for all adults aged 30 years and older.  Meningococcal vaccine.** / Consult your health care provider.  Hepatitis A vaccine.** / Consult your health care provider.  Hepatitis B vaccine.** / Consult your health care provider.  Haemophilus influenzae type b (Hib) vaccine.** / Consult your health care provider. **Family history and personal history of risk and conditions may change your health care provider's recommendations. Document Released: 02/11/2002 Document Revised: 12/21/2013 Document Reviewed: 05/13/2011 [facility] Patient Information 2015 [place], [place]. This information is not intended to replace advice given to you by your health care provider. Make sure you discuss any questions you have with your health care provider.
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
## 2015-05-24 NOTE — Patient Instructions (Signed)
[name] , Thank you for taking time to come for your Medicare Wellness Visit. I appreciate your ongoing commitment to your health goals. Please review the following plan we discussed and let me know if I can assist you in the future.    This is a list of the screening recommended for you and due dates:  Health Maintenance  Topic Date Due  . Colon Cancer Screening  Patient declined fecal occult / stool test  . Eye exam for diabetics  Will request last visit notes from Dr Katy Fitch  . Shingles Vaccine  Delayed due to cost  . Tetanus Vaccine  Delayed due to cost  . Pneumonia vaccines (2 of 2 - PCV13) Completed - Prevnar 13 given in office today  . Urine Protein Check  07/05/2015  . Flu Shot  07/31/2015  . Hemoglobin A1C  10/13/2015  . Complete foot exam   04/12/2016  *Topic was postponed. The date shown is not the original due date.   Preventive Care for Adults A healthy lifestyle and preventive care can promote health and wellness. Preventive health guidelines for men include the following key practices:  A routine yearly physical is a good way to check with your health care provider about your health and preventative screening. It is a chance to share any concerns and updates on your health and to receive a thorough exam.  Visit your dentist for a routine exam and preventative care every 6 months. Brush your teeth twice a day and floss once a day. Good oral hygiene prevents tooth decay and gum disease.  The frequency of eye exams is based on your age, health, family medical history, use of contact lenses, and other factors. Follow your health care provider's recommendations for frequency of eye exams.  Eat a healthy diet. Foods such as vegetables, fruits, whole grains, low-fat dairy products, and lean protein foods contain the nutrients you need without too many calories. Decrease your intake of foods high in solid fats, added sugars, and salt. Eat the right amount of calories for you.Get  information about a proper diet from your health care provider, if necessary.  Regular physical exercise is one of the most important things you can do for your health. Most adults should get at least 150 minutes of moderate-intensity exercise (any activity that increases your heart rate and causes you to sweat) each week. In addition, most adults need muscle-strengthening exercises on 2 or more days a week.  Maintain a healthy weight. The body mass index (BMI) is a screening tool to identify possible weight problems. It provides an estimate of body fat based on height and weight. Your health care provider can find your BMI and can help you achieve or maintain a healthy weight.For adults 20 years and older:  A BMI below 18.5 is considered underweight.  A BMI of 18.5 to 24.9 is normal.  A BMI of 25 to 29.9 is considered overweight.  A BMI of 30 and above is considered obese.  Maintain normal blood lipids and cholesterol levels by exercising and minimizing your intake of saturated fat. Eat a balanced diet with plenty of fruit and vegetables. Blood tests for lipids and cholesterol should begin at age 19 and be repeated every 5 years. If your lipid or cholesterol levels are high, you are over 50, or you are at high risk for heart disease, you may need your cholesterol levels checked more frequently.Ongoing high lipid and cholesterol levels should be treated with medicines if diet and  exercise are not working.  If you smoke, find out from your health care provider how to quit. If you do not use tobacco, do not start.  Lung cancer screening is recommended for adults aged 33-80 years who are at high risk for developing lung cancer because of a history of smoking. A yearly low-dose CT scan of the lungs is recommended for people who have at least a 30-pack-year history of smoking and are a current smoker or have quit within the past 15 years. A pack year of smoking is smoking an average of 1 pack of  cigarettes a day for 1 year (for example: 1 pack a day for 30 years or 2 packs a day for 15 years). Yearly screening should continue until the smoker has stopped smoking for at least 15 years. Yearly screening should be stopped for people who develop a health problem that would prevent them from having lung cancer treatment.  If you choose to drink alcohol, do not have more than 2 drinks per day. One drink is considered to be 12 ounces (355 mL) of beer, 5 ounces (148 mL) of wine, or 1.5 ounces (44 mL) of liquor.  Avoid use of street drugs. Do not share needles with anyone. Ask for help if you need support or instructions about stopping the use of drugs.  High blood pressure causes heart disease and increases the risk of stroke. Your blood pressure should be checked at least every 1-2 years. Ongoing high blood pressure should be treated with medicines, if weight loss and exercise are not effective.  If you are 39-7 years old, ask your health care provider if you should take aspirin to prevent heart disease.  Diabetes screening involves taking a blood sample to check your fasting blood sugar level. This should be done once every 3 years, after age 7, if you are within normal weight and without risk factors for diabetes. Testing should be considered at a younger age or be carried out more frequently if you are overweight and have at least 1 risk factor for diabetes.  Colorectal cancer can be detected and often prevented. Most routine colorectal cancer screening begins at the age of 76 and continues through age 62. However, your health care provider may recommend screening at an earlier age if you have risk factors for colon cancer. On a yearly basis, your health care provider may provide home test kits to check for hidden blood in the stool. Use of a small camera at the end of a tube to directly examine the colon (sigmoidoscopy or colonoscopy) can detect the earliest forms of colorectal cancer. Talk to  your health care provider about this at age 66, when routine screening begins. Direct exam of the colon should be repeated every 5-10 years through age 84, unless early forms of precancerous polyps or small growths are found.  People who are at an increased risk for hepatitis B should be screened for this virus. You are considered at high risk for hepatitis B if:  You were born in a country where hepatitis B occurs often. Talk with your health care provider about which countries are considered high risk.  Your parents were born in a high-risk country and you have not received a shot to protect against hepatitis B (hepatitis B vaccine).  You have HIV or AIDS.  You use needles to inject street drugs.  You live with, or have sex with, someone who has hepatitis B.  You are a man who has sex  with other men (MSM).  You get hemodialysis treatment.  You take certain medicines for conditions such as cancer, organ transplantation, and autoimmune conditions.  Hepatitis C blood testing is recommended for all people born from [birth year] through 1965 and any individual with known risks for hepatitis C.  Practice safe sex. Use condoms and avoid high-risk sexual practices to reduce the spread of sexually transmitted infections (STIs). STIs include gonorrhea, chlamydia, syphilis, trichomonas, herpes, HPV, and human immunodeficiency virus (HIV). Herpes, HIV, and HPV are viral illnesses that have no cure. They can result in disability, cancer, and death.  If you are at risk of being infected with HIV, it is recommended that you take a prescription medicine daily to prevent HIV infection. This is called preexposure prophylaxis (PrEP). You are considered at risk if:  You are a man who has sex with other men (MSM) and have other risk factors.  You are a heterosexual man, are sexually active, and are at increased risk for HIV infection.  You take drugs by injection.  You are sexually active with a partner who has  HIV.  Talk with your health care provider about whether you are at high risk of being infected with HIV. If you choose to begin PrEP, you should first be tested for HIV. You should then be tested every 3 months for as long as you are taking PrEP.  A one-time screening for abdominal aortic aneurysm (AAA) and surgical repair of large AAAs by ultrasound are recommended for men ages 47 to 65 years who are current or former smokers.  Healthy men should no longer receive prostate-specific antigen (PSA) blood tests as part of routine cancer screening. Talk with your health care provider about prostate cancer screening.  Testicular cancer screening is not recommended for adult males who have no symptoms. Screening includes self-exam, a health care provider exam, and other screening tests. Consult with your health care provider about any symptoms you have or any concerns you have about testicular cancer.  Use sunscreen. Apply sunscreen liberally and repeatedly throughout the day. You should seek shade when your shadow is shorter than you. Protect yourself by wearing long sleeves, pants, a wide-brimmed hat, and sunglasses year round, whenever you are outdoors.  Once a month, do a whole-body skin exam, using a mirror to look at the skin on your back. Tell your health care provider about new moles, moles that have irregular borders, moles that are larger than a pencil eraser, or moles that have changed in shape or color.  Stay current with required vaccines (immunizations).  Influenza vaccine. All adults should be immunized every year.  Tetanus, diphtheria, and acellular pertussis (Td, Tdap) vaccine. An adult who has not previously received Tdap or who does not know his vaccine status should receive 1 dose of Tdap. This initial dose should be followed by tetanus and diphtheria toxoids (Td) booster doses every 10 years. Adults with an unknown or incomplete history of completing a 3-dose immunization series with  Td-containing vaccines should begin or complete a primary immunization series including a Tdap dose. Adults should receive a Td booster every 10 years.  Varicella vaccine. An adult without evidence of immunity to varicella should receive 2 doses or a second dose if he has previously received 1 dose.  Human papillomavirus (HPV) vaccine. Males aged 57-21 years who have not received the vaccine previously should receive the 3-dose series. Males aged 22-26 years may be immunized. Immunization is recommended through the age of 36 years for any  male who has sex with males and did not get any or all doses earlier. Immunization is recommended for any person with an immunocompromised condition through the age of 48 years if he did not get any or all doses earlier. During the 3-dose series, the second dose should be obtained 4-8 weeks after the first dose. The third dose should be obtained 24 weeks after the first dose and 16 weeks after the second dose.  Zoster vaccine. One dose is recommended for adults aged 107 years or older unless certain conditions are present.  Measles, mumps, and rubella (MMR) vaccine. Adults born before [birth year] generally are considered immune to measles and mumps. Adults born in [birth year] or later should have 1 or more doses of MMR vaccine unless there is a contraindication to the vaccine or there is laboratory evidence of immunity to each of the three diseases. A routine second dose of MMR vaccine should be obtained at least 28 days after the first dose for students attending postsecondary schools, health care workers, or international travelers. People who received inactivated measles vaccine or an unknown type of measles vaccine during 1963-1967 should receive 2 doses of MMR vaccine. People who received inactivated mumps vaccine or an unknown type of mumps vaccine before 1979 and are at high risk for mumps infection should consider immunization with 2 doses of MMR vaccine. Unvaccinated health care  workers born before [birth year] who lack laboratory evidence of measles, mumps, or rubella immunity or laboratory confirmation of disease should consider measles and mumps immunization with 2 doses of MMR vaccine or rubella immunization with 1 dose of MMR vaccine.  Pneumococcal 13-valent conjugate (PCV13) vaccine. When indicated, a person who is uncertain of his immunization history and has no record of immunization should receive the PCV13 vaccine. An adult aged 15 years or older who has certain medical conditions and has not been previously immunized should receive 1 dose of PCV13 vaccine. This PCV13 should be followed with a dose of pneumococcal polysaccharide (PPSV23) vaccine. The PPSV23 vaccine dose should be obtained at least 8 weeks after the dose of PCV13 vaccine. An adult aged 57 years or older who has certain medical conditions and previously received 1 or more doses of PPSV23 vaccine should receive 1 dose of PCV13. The PCV13 vaccine dose should be obtained 1 or more years after the last PPSV23 vaccine dose.  Pneumococcal polysaccharide (PPSV23) vaccine. When PCV13 is also indicated, PCV13 should be obtained first. All adults aged 60 years and older should be immunized. An adult younger than age 65 years who has certain medical conditions should be immunized. Any person who resides in a nursing home or long-term care facility should be immunized. An adult smoker should be immunized. People with an immunocompromised condition and certain other conditions should receive both PCV13 and PPSV23 vaccines. People with human immunodeficiency virus (HIV) infection should be immunized as soon as possible after diagnosis. Immunization during chemotherapy or radiation therapy should be avoided. Routine use of PPSV23 vaccine is not recommended for American Indians, [place] Natives, or people younger than 65 years unless there are medical conditions that require PPSV23 vaccine. When indicated, people who have unknown  immunization and have no record of immunization should receive PPSV23 vaccine. One-time revaccination 5 years after the first dose of PPSV23 is recommended for people aged 19-64 years who have chronic kidney failure, nephrotic syndrome, asplenia, or immunocompromised conditions. People who received 1-2 doses of PPSV23 before age 7 years should receive another dose of PPSV23 vaccine at  age 84 years or later if at least 5 years have passed since the previous dose. Doses of PPSV23 are not needed for people immunized with PPSV23 at or after age 26 years.  Meningococcal vaccine. Adults with asplenia or persistent complement component deficiencies should receive 2 doses of quadrivalent meningococcal conjugate (MenACWY-D) vaccine. The doses should be obtained at least 2 months apart. Microbiologists working with certain meningococcal bacteria, [employer] recruits, people at risk during an outbreak, and people who travel to or live in countries with a high rate of meningitis should be immunized. A first-year college student up through age 71 years who is living in a residence hall should receive a dose if he did not receive a dose on or after his 16th birthday. Adults who have certain high-risk conditions should receive one or more doses of vaccine.  Hepatitis A vaccine. Adults who wish to be protected from this disease, have certain high-risk conditions, work with hepatitis A-infected animals, work in hepatitis A research labs, or travel to or work in countries with a high rate of hepatitis A should be immunized. Adults who were previously unvaccinated and who anticipate close contact with an international adoptee during the first 60 days after arrival in the [place] from a country with a high rate of hepatitis A should be immunized.  Hepatitis B vaccine. Adults should be immunized if they wish to be protected from this disease, have certain high-risk conditions, may be exposed to blood or other infectious body  fluids, are household contacts or sex partners of hepatitis B positive people, are clients or workers in certain care facilities, or travel to or work in countries with a high rate of hepatitis B.  Haemophilus influenzae type b (Hib) vaccine. A previously unvaccinated person with asplenia or sickle cell disease or having a scheduled splenectomy should receive 1 dose of Hib vaccine. Regardless of previous immunization, a recipient of a hematopoietic stem cell transplant should receive a 3-dose series 6-12 months after his successful transplant. Hib vaccine is not recommended for adults with HIV infection. Preventive Service / Frequency Ages 20 and over  Blood pressure check.** / Every 1 to 2 years.  Lipid and cholesterol check.**/ Every 5 years beginning at age 81.  Lung cancer screening. / Every year if you are aged 3-80 years and have a 30-pack-year history of smoking and currently smoke or have quit within the past 15 years. Yearly screening is stopped once you have quit smoking for at least 15 years or develop a health problem that would prevent you from having lung cancer treatment.  Fecal occult blood test (FOBT) of stool. / Every year beginning at age 70 and continuing until age 66. You may not have to do this test if you get a colonoscopy every 10 years.  Flexible sigmoidoscopy** or colonoscopy.** / Every 5 years for a flexible sigmoidoscopy or every 10 years for a colonoscopy beginning at age 4 and continuing until age 40.  Hepatitis C blood test.** / For all people born from [birth year] through 1965 and any individual with known risks for hepatitis C.  Abdominal aortic aneurysm (AAA) screening.** / A one-time screening for ages 58 to 42 years who are current or former smokers.  Skin self-exam. / Monthly.  Influenza vaccine. / Every year.  Tetanus, diphtheria, and acellular pertussis (Tdap/Td) vaccine.** / 1 dose of Td every 10 years.  Varicella vaccine.** / Consult your health care  provider.  Zoster vaccine.** / 1 dose for adults aged 47 years  or older.  Pneumococcal 13-valent conjugate (PCV13) vaccine.** / Consult your health care provider.  Pneumococcal polysaccharide (PPSV23) vaccine.** / 1 dose for all adults aged 30 years and older.  Meningococcal vaccine.** / Consult your health care provider.  Hepatitis A vaccine.** / Consult your health care provider.  Hepatitis B vaccine.** / Consult your health care provider.  Haemophilus influenzae type b (Hib) vaccine.** / Consult your health care provider. **Family history and personal history of risk and conditions may change your health care provider's recommendations. Document Released: 02/11/2002 Document Revised: 12/21/2013 Document Reviewed: 05/13/2011 [facility] Patient Information 2015 [place], [place]. This information is not intended to replace advice given to you by your health care provider. Make sure you discuss any questions you have with your health care provider.

## 2015-05-25 ENCOUNTER — Telehealth: Payer: Self-pay | Admitting: Family

## 2015-05-25 MED ORDER — INSULIN GLARGINE 300 UNIT/ML ~~LOC~~ SOPN: 66.0000 [IU] | PEN_INJECTOR | Freq: Every day | SUBCUTANEOUS | Status: DC

## 2015-05-25 NOTE — Telephone Encounter (Signed)
Sent to pharmacy and called to confirm receipt.[name] confirmed that Rx was received today.  Patient notified.

## 2015-06-02 ENCOUNTER — Other Ambulatory Visit: Payer: Self-pay | Admitting: Family

## 2015-06-05 ENCOUNTER — Telehealth: Payer: Self-pay | Admitting: Family Medicine

## 2015-06-05 MED ORDER — OXYCODONE-ACETAMINOPHEN 5-325 MG PO TABS: 1.0000 | ORAL_TABLET | Freq: Two times a day (BID) | ORAL | Status: DC | PRN

## 2015-06-05 NOTE — Telephone Encounter (Signed)
Patient notified that rx up front and ready to pick up. 

## 2015-06-05 NOTE — Telephone Encounter (Signed)
RX ready for pick up 

## 2015-06-09 ENCOUNTER — Encounter: Payer: Self-pay | Admitting: Pharmacist

## 2015-06-09 ENCOUNTER — Other Ambulatory Visit: Payer: Medicare Other

## 2015-06-09 DIAGNOSIS — Z1212 Encounter for screening for malignant neoplasm of rectum: Secondary | ICD-10-CM | POA: Diagnosis not present

## 2015-06-09 NOTE — Progress Notes (Signed)
Lab only 

## 2015-06-11 LAB — FECAL OCCULT BLOOD, IMMUNOCHEMICAL: Fecal Occult Bld: NEGATIVE

## 2015-06-19 ENCOUNTER — Telehealth: Payer: Self-pay | Admitting: Family

## 2015-06-19 DIAGNOSIS — E119 Type 2 diabetes mellitus without complications: Secondary | ICD-10-CM

## 2015-06-19 NOTE — Telephone Encounter (Addendum)
Patient called asking if Tammy may have samples of meloxicam 15 or amlodipine 10mg . States he is out of these medications and cannot afford to buy them.  States he has gotten samples from [facility] before.

## 2015-06-19 NOTE — Telephone Encounter (Signed)
Phone call to patient- no answer, unable to leave voicemail.

## 2015-06-19 NOTE — Telephone Encounter (Signed)
Dr. Sabra Heck please advise if you authorize melox

## 2015-06-21 NOTE — Telephone Encounter (Signed)
Pt notified that we do have samples of tujeo- he states he will come to the office to pick up Friday morning. Pt notified that he was referred to case management through [facility].

## 2015-06-21 NOTE — Telephone Encounter (Signed)
We do not have any sample of meloxicam or amlodipine.   They are available generically. In past he was able to get them at [facility] at a reduced cost when he was in Medicare coverage gap.  Please notify patient

## 2015-06-22 NOTE — Patient Outreach (Signed)
[facility] [facility]) Care Management  06/22/2015  [name] [birth year] [id number]   Referral from MD, assigned Sherrin Daisy, RN to outreach.  Ronnell Freshwater. [place], [place] Management [place] Assistant Phone: [phone] Fax: [phone]

## 2015-06-29 ENCOUNTER — Other Ambulatory Visit: Payer: Self-pay | Admitting: Pharmacist

## 2015-06-29 MED ORDER — AMLODIPINE BESYLATE 10 MG PO TABS: ORAL_TABLET | ORAL | Status: DC

## 2015-06-29 MED ORDER — MELOXICAM 15 MG PO TABS: ORAL_TABLET | ORAL | Status: DC

## 2015-06-30 ENCOUNTER — Other Ambulatory Visit: Payer: Self-pay | Admitting: *Deleted

## 2015-06-30 NOTE — Patient Outreach (Signed)
[facility]) Care Management  06/30/2015  [name] 11/22/1933 [id number]  MD referral:  Telephone call to patient: left message on voice mail requesting return call.  Plan: will follow up.   Sherrin Daisy, RN BSN [place] Management Coordinator [facility] Care Management  [phone]

## 2015-07-04 ENCOUNTER — Telehealth: Payer: Self-pay | Admitting: Pharmacist

## 2015-07-05 MED ORDER — INSULIN GLARGINE 300 UNIT/ML ~~LOC~~ SOPN: 66.0000 [IU] | PEN_INJECTOR | Freq: Every day | SUBCUTANEOUS | Status: DC

## 2015-07-05 NOTE — Telephone Encounter (Signed)
Patient aware and will pick up tomorrow.

## 2015-07-05 NOTE — Telephone Encounter (Signed)
#  1 toujeo sample left for patient

## 2015-07-06 ENCOUNTER — Other Ambulatory Visit: Payer: Self-pay | Admitting: *Deleted

## 2015-07-06 NOTE — Patient Outreach (Signed)
[name]) Care Management  07/06/2015  [name] Oct 25, 1933 [id number]   Telephone call to patient; left message on voice mail requesting return call.  Plan: will follow up. Sherrin Daisy, RN BSN [place] Management Coordinator [facility] Care Management  [phone]

## 2015-07-11 ENCOUNTER — Telehealth: Payer: Self-pay | Admitting: Family

## 2015-07-11 MED ORDER — OXYCODONE-ACETAMINOPHEN 5-325 MG PO TABS: 1.0000 | ORAL_TABLET | Freq: Two times a day (BID) | ORAL | Status: DC | PRN

## 2015-07-11 NOTE — Telephone Encounter (Signed)
Pt aware written Rx is at front desk ready for pickup  

## 2015-07-11 NOTE — Telephone Encounter (Signed)
RX ready for pick up 

## 2015-07-12 ENCOUNTER — Encounter: Payer: Self-pay | Admitting: *Deleted

## 2015-07-12 ENCOUNTER — Other Ambulatory Visit: Payer: Self-pay | Admitting: *Deleted

## 2015-07-12 NOTE — Patient Outreach (Signed)
[facility]) Care Management  07/12/2015  [name] [birth year] [id number]  Telephone call attempt x 3; left message on voice mail requesting return call.  Plan: will sent outreach letter and follow up in 10 business days.  Sherrin Daisy, RN BSN [place] Management Coordinator [facility] Care Management  [phone]

## 2015-07-18 ENCOUNTER — Other Ambulatory Visit: Payer: Self-pay | Admitting: Family

## 2015-07-24 ENCOUNTER — Telehealth: Payer: Self-pay | Admitting: Family Medicine

## 2015-07-24 ENCOUNTER — Other Ambulatory Visit: Payer: Self-pay | Admitting: Family

## 2015-07-25 ENCOUNTER — Other Ambulatory Visit: Payer: Self-pay | Admitting: Family

## 2015-07-25 NOTE — Telephone Encounter (Signed)
Pt notified Sample in drawer in lab

## 2015-07-26 ENCOUNTER — Other Ambulatory Visit: Payer: Self-pay | Admitting: Family

## 2015-07-28 ENCOUNTER — Other Ambulatory Visit: Payer: Self-pay | Admitting: Nurse Practitioner

## 2015-07-31 NOTE — Telephone Encounter (Signed)
Last seen 04/13/15 Dr Sabra Heck   Lat Vit D 12/02/14  29.3

## 2015-08-03 ENCOUNTER — Encounter: Payer: Self-pay | Admitting: *Deleted

## 2015-08-03 NOTE — Patient Outreach (Signed)
[facility]) Care Management  08/03/2015  Marqus Macphee [birth year] [id number]   No response from patient after call attempts and outreach letter sent.  Plan:  Will close case and send MD closure letter.   Sherrin Daisy, RN BSN [place] Management Coordinator [facility] Care Management  [phone]

## 2015-08-06 ENCOUNTER — Other Ambulatory Visit: Payer: Self-pay | Admitting: Family

## 2015-08-07 ENCOUNTER — Other Ambulatory Visit: Payer: Self-pay | Admitting: Family Medicine

## 2015-08-07 NOTE — Telephone Encounter (Signed)
[facility] pt, last filled 06/19/15, last seen 04/13/15. If approved route to pool, nurse call in at [facility]

## 2015-08-07 NOTE — Telephone Encounter (Signed)
rx called to pharmacy 

## 2015-08-08 ENCOUNTER — Telehealth: Payer: Self-pay | Admitting: Family

## 2015-08-08 MED ORDER — INSULIN GLARGINE 300 UNIT/ML ~~LOC~~ SOPN: 66.0000 [IU] | PEN_INJECTOR | Freq: Every day | SUBCUTANEOUS | Status: DC

## 2015-08-08 MED ORDER — OXYCODONE-ACETAMINOPHEN 5-325 MG PO TABS
1.0000 | ORAL_TABLET | Freq: Two times a day (BID) | ORAL | Status: DC | PRN
Start: 2015-08-08 — End: 2015-09-15

## 2015-08-08 NOTE — Telephone Encounter (Signed)
RX ready for pick up 

## 2015-08-08 NOTE — Telephone Encounter (Signed)
lmovm that written Rx is at the front desk ready for pickup

## 2015-08-08 NOTE — Telephone Encounter (Signed)
lmovm that sample is ready for pickup

## 2015-08-09 NOTE — Patient Outreach (Signed)
[facility]) Care Management  08/03/2015  [name] [birth year] [id number]   Notification from Sherrin Daisy, RN to close case due to unable to establish contact with patient for [place] Management services.  Thanks, Ronnell Freshwater. [place], [place] Assistant Phone: [phone] Fax: [phone]

## 2015-08-09 NOTE — Telephone Encounter (Signed)
Would not continue high-dose vitamin D until vitamin D level is checked

## 2015-08-09 NOTE — Telephone Encounter (Signed)
Last seen 04/13/15 Dr Sabra Heck  Last Vit D 12/02/14  29.3

## 2015-08-15 NOTE — Telephone Encounter (Signed)
Last seen 04/13/15 Dr Sabra Heck  Last Vit D 12/02/14  29.3

## 2015-08-15 NOTE — Telephone Encounter (Signed)
Already addressed this issue last week and the message was to continue on this dose he needs to have a vitamin D level done

## 2015-08-21 ENCOUNTER — Encounter: Payer: Self-pay | Admitting: Family

## 2015-08-21 ENCOUNTER — Ambulatory Visit (INDEPENDENT_AMBULATORY_CARE_PROVIDER_SITE_OTHER): Payer: Medicare Other | Admitting: Family

## 2015-08-21 VITALS — BP 164/76 | HR 88 | Temp 97.0°F | Ht 68.0 in | Wt 224.0 lb

## 2015-08-21 DIAGNOSIS — L259 Unspecified contact dermatitis, unspecified cause: Secondary | ICD-10-CM

## 2015-08-21 MED ORDER — TRIAMCINOLONE ACETONIDE 0.025 % EX OINT: 1.0000 "application " | TOPICAL_OINTMENT | Freq: Two times a day (BID) | CUTANEOUS | Status: DC

## 2015-08-21 MED ORDER — METHYLPREDNISOLONE 4 MG PO TBPK: ORAL_TABLET | ORAL | Status: DC

## 2015-08-21 NOTE — Patient Instructions (Addendum)
Contact Dermatitis Contact dermatitis is a reaction to certain substances that touch the skin. Contact dermatitis can be either irritant contact dermatitis or allergic contact dermatitis. Irritant contact dermatitis does not require previous exposure to the substance for a reaction to occur.Allergic contact dermatitis only occurs if you have been exposed to the substance before. Upon a repeat exposure, your body reacts to the substance.  CAUSES  Many substances can cause contact dermatitis. Irritant dermatitis is most commonly caused by repeated exposure to mildly irritating substances, such as:  Makeup.  Soaps.  Detergents.  Bleaches.  Acids.  Metal salts, such as nickel. Allergic contact dermatitis is most commonly caused by exposure to:  Poisonous plants.  Chemicals (deodorants, shampoos).  Jewelry.  Latex.  Neomycin in triple antibiotic cream.  Preservatives in products, including clothing. SYMPTOMS  The area of skin that is exposed may develop:  Dryness or flaking.  Redness.  Cracks.  Itching.  Pain or a burning sensation.  Blisters. With allergic contact dermatitis, there may also be swelling in areas such as the eyelids, mouth, or genitals.  DIAGNOSIS  Your caregiver can usually tell what the problem is by doing a physical exam. In cases where the cause is uncertain and an allergic contact dermatitis is suspected, a patch skin test may be performed to help determine the cause of your dermatitis. TREATMENT Treatment includes protecting the skin from further contact with the irritating substance by avoiding that substance if possible. Barrier creams, powders, and gloves may be helpful. Your caregiver may also recommend:  Steroid creams or ointments applied 2 times daily. For best results, soak the rash area in cool water for 20 minutes. Then apply the medicine. Cover the area with a plastic wrap. You can store the steroid cream in the refrigerator for a "chilly"  effect on your rash. That may decrease itching. Oral steroid medicines may be needed in more severe cases.  Antibiotics or antibacterial ointments if a skin infection is present.  Antihistamine lotion or an antihistamine taken by mouth to ease itching.  Lubricants to keep moisture in your skin.  Burow's solution to reduce redness and soreness or to dry a weeping rash. Mix one packet or tablet of solution in 2 cups cool water. Dip a clean washcloth in the mixture, wring it out a bit, and put it on the affected area. Leave the cloth in place for 30 minutes. Do this as often as possible throughout the day.  Taking several cornstarch or baking soda baths daily if the area is too large to cover with a washcloth. Harsh chemicals, such as alkalis or acids, can cause skin damage that is like a burn. You should flush your skin for 15 to 20 minutes with cold water after such an exposure. You should also seek immediate medical care after exposure. Bandages (dressings), antibiotics, and pain medicine may be needed for severely irritated skin.  HOME CARE INSTRUCTIONS  Avoid the substance that caused your reaction.  Keep the area of skin that is affected away from hot water, soap, sunlight, chemicals, acidic substances, or anything else that would irritate your skin.  Do not scratch the rash. Scratching may cause the rash to become infected.  You may take cool baths to help stop the itching.  Only take over-the-counter or prescription medicines as directed by your caregiver.  See your caregiver for follow-up care as directed to make sure your skin is healing properly. SEEK MEDICAL CARE IF:   Your condition is not better after 3   days of treatment.  You seem to be getting worse.  You see signs of infection such as swelling, tenderness, redness, soreness, or warmth in the affected area.  You have any problems related to your medicines. Document Released: 12/13/2000 Document Revised: 03/09/2012  Document Reviewed: 05/21/2011 [facility] Patient Information 2015 [place], [place]. This information is not intended to replace advice given to you by your health care provider. Make sure you discuss any questions you have with your health care provider. Basic Carbohydrate Counting for Diabetes Mellitus Carbohydrate counting is a method for keeping track of the amount of carbohydrates you eat. Eating carbohydrates naturally increases the level of sugar (glucose) in your blood, so it is important for you to know the amount that is okay for you to have in every meal. Carbohydrate counting helps keep the level of glucose in your blood within normal limits. The amount of carbohydrates allowed is different for every person. A dietitian can help you calculate the amount that is right for you. Once you know the amount of carbohydrates you can have, you can count the carbohydrates in the foods you want to eat. Carbohydrates are found in the following foods:  Grains, such as breads and cereals.  Dried beans and soy products.  Starchy vegetables, such as potatoes, peas, and corn.  Fruit and fruit juices.  Milk and yogurt.  Sweets and snack foods, such as cake, cookies, candy, chips, soft drinks, and fruit drinks. CARBOHYDRATE COUNTING There are two ways to count the carbohydrates in your food. You can use either of the methods or a combination of both. Reading the "Nutrition Facts" on [place] The "Nutrition Facts" is an area that is included on the labels of almost all packaged food and beverages in the [place]. It includes the serving size of that food or beverage and information about the nutrients in each serving of the food, including the grams (g) of carbohydrate per serving.  Decide the number of servings of this food or beverage that you will be able to eat or drink. Multiply that number of servings by the number of grams of carbohydrate that is listed on the label for that serving. The  total will be the amount of carbohydrates you will be having when you eat or drink this food or beverage. Learning Standard Serving Sizes of Food When you eat food that is not packaged or does not include "Nutrition Facts" on the label, you need to measure the servings in order to count the amount of carbohydrates.A serving of most carbohydrate-rich foods contains about 15 g of carbohydrates. The following list includes serving sizes of carbohydrate-rich foods that provide 15 g ofcarbohydrate per serving:   1 slice of bread (1 oz) or 1 six-inch tortilla.    of a hamburger bun or English muffin.  4-6 crackers.   cup unsweetened dry cereal.    cup hot cereal.   cup rice or pasta.    cup mashed potatoes or  of a large baked potato.  1 cup fresh fruit or one small piece of fruit.    cup canned or frozen fruit or fruit juice.  1 cup milk.   cup plain fat-free yogurt or yogurt sweetened with artificial sweeteners.   cup cooked dried beans or starchy vegetable, such as peas, corn, or potatoes.  Decide the number of standard-size servings that you will eat. Multiply that number of servings by 15 (the grams of carbohydrates in that serving). For example, if you eat 2 cups  of strawberries, you will have eaten 2 servings and 30 g of carbohydrates (2 servings x 15 g = 30 g). For foods such as soups and casseroles, in which more than one food is mixed in, you will need to count the carbohydrates in each food that is included. EXAMPLE OF CARBOHYDRATE COUNTING Sample Dinner  3 oz chicken breast.   cup of brown rice.   cup of corn.  1 cup milk.   1 cup strawberries with sugar-free whipped topping.  Carbohydrate Calculation Step 1: Identify the foods that contain carbohydrates:   Rice.   Corn.   Milk.   Strawberries. Step 2:Calculate the number of servings eaten of each:   2 servings of rice.   1 serving of corn.   1 serving of milk.   1 serving of  strawberries. Step 3: Multiply each of those number of servings by 15 g:   2 servings of rice x 15 g = 30 g.   1 serving of corn x 15 g = 15 g.   1 serving of milk x 15 g = 15 g.   1 serving of strawberries x 15 g = 15 g. Step 4: Add together all of the amounts to find the total grams of carbohydrates eaten: 30 g + 15 g + 15 g + 15 g = 75 g. Document Released: 12/16/2005 Document Revised: 05/02/2014 Document Reviewed: 11/12/2013 [facility] Patient Information 2015 [place], [place]. This information is not intended to replace advice given to you by your health care provider. Make sure you discuss any questions you have with your health care provider.

## 2015-08-21 NOTE — Progress Notes (Addendum)
   Subjective:    Patient ID: [name], male    DOB: 1933/08/07, 81 y.o.   MRN: [id number]  Rash This is a new problem. The current episode started in the past 7 days. The problem has been waxing and waning since onset. The affected locations include the right foot and right ankle. The rash is characterized by redness, itchiness and pain. He was exposed to plant contact. Pertinent negatives include no cough, diarrhea, joint pain, shortness of breath or sore throat. Past treatments include anti-itch cream. The treatment provided mild relief.      Review of Systems  Constitutional: Negative.   HENT: Negative.  Negative for sore throat.   Respiratory: Negative.  Negative for cough and shortness of breath.   Cardiovascular: Negative.   Gastrointestinal: Negative.  Negative for diarrhea.  Endocrine: Negative.   Genitourinary: Negative.   Musculoskeletal: Negative.  Negative for joint pain.  Skin: Positive for rash.  Neurological: Negative.   Hematological: Negative.   Psychiatric/Behavioral: Negative.   All other systems reviewed and are negative.      Objective:   Physical Exam  Constitutional: He is oriented to person, place, and time. He appears well-developed and well-nourished. No distress.  HENT:  Head: Normocephalic.  Eyes: Pupils are equal, round, and reactive to light. Right eye exhibits no discharge. Left eye exhibits no discharge.  Neck: Normal range of motion. Neck supple. No thyromegaly present.  Cardiovascular: Normal rate, regular rhythm, normal heart sounds and intact distal pulses.   No murmur heard. Pulmonary/Chest: Effort normal and breath sounds normal. No respiratory distress. He has no wheezes.  Abdominal: Soft. Bowel sounds are normal. He exhibits no distension. There is no tenderness.  Musculoskeletal: Normal range of motion. He exhibits no edema or tenderness.  Neurological: He is alert and oriented to person, place, and time. He has normal reflexes. No  cranial nerve deficit.  Skin: Skin is warm and dry. Rash noted. No erythema.  Scattered vesicle on right lower ankle and foot, one on left arm  Psychiatric: He has a normal mood and affect. His behavior is normal. Judgment and thought content normal.  Vitals reviewed.     BP 164/76 mmHg  Pulse 88  Temp(Src) 97 F (36.1 C) (Oral)  Ht 5\' 8"  (1.727 m)  Wt 224 lb (101.606 kg)  BMI 34.07 kg/m2     Assessment & Plan:  1. Contact dermatitis -Do not scratch or pick at -Good hand hygiene -Wear protective clothing while outside- Long sleeves and long pants -Take a shower as soon as possible after being outside =Low carb diet stressed and good control of blood glucose -RTO prn - triamcinolone (KENALOG) 0.025 % ointment; Apply 1 application topically 2 (two) times daily.  Dispense: 30 g; Refill: 0 - methylPREDNISolone (MEDROL DOSEPAK) 4 MG TBPK tablet; Use as directed  Dispense: 21 tablet; Refill: 0   [name], FNP

## 2015-08-25 ENCOUNTER — Telehealth: Payer: Self-pay | Admitting: Pharmacist

## 2015-08-28 MED ORDER — INSULIN GLARGINE 300 UNIT/ML ~~LOC~~ SOPN: 66.0000 [IU] | PEN_INJECTOR | Freq: Every day | SUBCUTANEOUS | Status: DC

## 2015-08-28 NOTE — Telephone Encounter (Signed)
patietn notified - #1 sample left in refrige for him.

## 2015-08-31 ENCOUNTER — Ambulatory Visit (INDEPENDENT_AMBULATORY_CARE_PROVIDER_SITE_OTHER): Payer: Medicare Other | Admitting: Family

## 2015-08-31 ENCOUNTER — Encounter: Payer: Self-pay | Admitting: Family

## 2015-08-31 VITALS — BP 154/78 | HR 100 | Temp 97.9°F | Ht 68.0 in | Wt 223.4 lb

## 2015-08-31 DIAGNOSIS — L259 Unspecified contact dermatitis, unspecified cause: Secondary | ICD-10-CM

## 2015-08-31 MED ORDER — MUPIROCIN 2 % EX OINT: 1.0000 "application " | TOPICAL_OINTMENT | Freq: Two times a day (BID) | CUTANEOUS | Status: DC

## 2015-08-31 NOTE — Progress Notes (Signed)
   Subjective:    Patient ID: [name], male    DOB: 10-28-1933, 81 y.o.   MRN: [id number]  Pt presents to the office for  HPI    Review of Systems  Constitutional: Negative.   HENT: Negative.   Respiratory: Negative.   Cardiovascular: Negative.   Gastrointestinal: Negative.   Endocrine: Negative.   Genitourinary: Negative.   Musculoskeletal: Negative.   Neurological: Negative.   Hematological: Negative.   Psychiatric/Behavioral: Negative.   All other systems reviewed and are negative.      Objective:   Physical Exam  Constitutional: He is oriented to person, place, and time. He appears well-developed and well-nourished. No distress.  HENT:  Head: Normocephalic.  Eyes: Pupils are equal, round, and reactive to light. Right eye exhibits no discharge. Left eye exhibits no discharge.  Neck: Normal range of motion. Neck supple. No thyromegaly present.  Cardiovascular: Normal rate, regular rhythm, normal heart sounds and intact distal pulses.   No murmur heard. Pulmonary/Chest: Effort normal and breath sounds normal. No respiratory distress. He has no wheezes.  Abdominal: Soft. Bowel sounds are normal. He exhibits no distension. There is no tenderness.  Musculoskeletal: Normal range of motion. He exhibits no edema or tenderness.  Neurological: He is alert and oriented to person, place, and time. He has normal reflexes. No cranial nerve deficit.  Skin: Skin is warm and dry. Rash noted. Rash is nodular. No erythema.  4 small erosions on right ankle- approx 1cm X5cm-Improved since last visit  Psychiatric: He has a normal mood and affect. His behavior is normal. Judgment and thought content normal.  Vitals reviewed.   BP 154/78 mmHg  Pulse 100  Temp(Src) 97.9 F (36.6 C) (Oral)  Ht 5\' 8"  (1.727 m)  Wt 223 lb 6.4 oz (101.334 kg)  BMI 33.98 kg/m2       Assessment & Plan:  1. Contact dermatitis -Continue with Kenalog cream -Do not scratch or rub -Good hand  hygiene -RTO prn - mupirocin ointment (BACTROBAN) 2 %; Apply 1 application topically 2 (two) times daily.  Dispense: 22 g; Refill: 0 - Anaerobic and Aerobic Culture  [name], FNP

## 2015-08-31 NOTE — Patient Instructions (Signed)

## 2015-09-04 LAB — ANAEROBIC AND AEROBIC CULTURE

## 2015-09-14 ENCOUNTER — Other Ambulatory Visit: Payer: Self-pay | Admitting: Family

## 2015-09-15 ENCOUNTER — Telehealth: Payer: Self-pay | Admitting: Family

## 2015-09-15 MED ORDER — OXYCODONE-ACETAMINOPHEN 5-325 MG PO TABS: 1.0000 | ORAL_TABLET | Freq: Two times a day (BID) | ORAL | Status: DC | PRN

## 2015-09-15 NOTE — Telephone Encounter (Signed)
Last filled 08/07/15, last seen 08/31/15. Route to pool.

## 2015-09-15 NOTE — Telephone Encounter (Signed)
Left message ,  Pain script is ready.

## 2015-09-15 NOTE — Telephone Encounter (Signed)
Called into [facility] and left on VM

## 2015-09-15 NOTE — Telephone Encounter (Signed)
RX ready for pick up 

## 2015-09-18 ENCOUNTER — Other Ambulatory Visit: Payer: Self-pay | Admitting: Family

## 2015-09-20 ENCOUNTER — Other Ambulatory Visit: Payer: Self-pay

## 2015-09-20 MED ORDER — ONETOUCH DELICA LANCETS 33G MISC: Status: DC

## 2015-09-25 ENCOUNTER — Telehealth: Payer: Self-pay | Admitting: Family

## 2015-09-25 MED ORDER — INSULIN GLARGINE 300 UNIT/ML ~~LOC~~ SOPN: 66.0000 [IU] | PEN_INJECTOR | Freq: Every day | SUBCUTANEOUS | Status: DC

## 2015-09-25 NOTE — Telephone Encounter (Signed)
Detailed message left that samples are in the lab refrigerator.

## 2015-09-27 ENCOUNTER — Ambulatory Visit (INDEPENDENT_AMBULATORY_CARE_PROVIDER_SITE_OTHER): Payer: Medicare Other

## 2015-09-27 DIAGNOSIS — Z23 Encounter for immunization: Secondary | ICD-10-CM

## 2015-10-05 ENCOUNTER — Ambulatory Visit: Payer: Self-pay | Admitting: Family Medicine

## 2015-10-13 ENCOUNTER — Other Ambulatory Visit: Payer: Self-pay | Admitting: Family

## 2015-10-13 ENCOUNTER — Other Ambulatory Visit: Payer: Self-pay | Admitting: Family Medicine

## 2015-10-13 ENCOUNTER — Other Ambulatory Visit: Payer: Self-pay | Admitting: Nurse Practitioner

## 2015-10-13 NOTE — Telephone Encounter (Signed)
Last lipids 11/2014

## 2015-10-16 ENCOUNTER — Other Ambulatory Visit: Payer: Self-pay | Admitting: Family Medicine

## 2015-10-16 NOTE — Telephone Encounter (Signed)
Last seen 08/31/15  Chisity  Last Vit 12/02/14  29.3

## 2015-10-18 ENCOUNTER — Telehealth: Payer: Self-pay | Admitting: Family

## 2015-10-19 ENCOUNTER — Telehealth: Payer: Self-pay | Admitting: Family

## 2015-10-19 MED ORDER — INSULIN GLARGINE 300 UNIT/ML ~~LOC~~ SOPN: 66.0000 [IU] | PEN_INJECTOR | Freq: Every day | SUBCUTANEOUS | Status: DC

## 2015-10-19 NOTE — Telephone Encounter (Signed)
Detailed message left for patient that samples are ready to be picked up. 

## 2015-10-25 ENCOUNTER — Other Ambulatory Visit: Payer: Self-pay | Admitting: Family

## 2015-10-25 MED ORDER — OXYCODONE-ACETAMINOPHEN 5-325 MG PO TABS: 1.0000 | ORAL_TABLET | Freq: Two times a day (BID) | ORAL | Status: DC | PRN

## 2015-10-25 NOTE — Telephone Encounter (Signed)
Pt is aware rx is ready for pick up. 

## 2015-10-25 NOTE — Telephone Encounter (Signed)
RX ready for pick up 

## 2015-10-25 NOTE — Telephone Encounter (Signed)
Script for pain medication is ready.

## 2015-10-31 ENCOUNTER — Telehealth: Payer: Self-pay | Admitting: Family

## 2015-10-31 ENCOUNTER — Telehealth: Payer: Self-pay | Admitting: *Deleted

## 2015-10-31 NOTE — Telephone Encounter (Signed)
Letter stating patient takes oxycodone is ready.

## 2015-10-31 NOTE — Telephone Encounter (Signed)
Left detailed message stating letter is ready for pick up and to CB with any further questions or concerns.

## 2015-10-31 NOTE — Telephone Encounter (Signed)
Letter ready for pick up

## 2015-11-01 ENCOUNTER — Telehealth: Payer: Self-pay | Admitting: Family

## 2015-11-07 DIAGNOSIS — H11822 Conjunctivochalasis, left eye: Secondary | ICD-10-CM | POA: Diagnosis not present

## 2015-11-07 DIAGNOSIS — H4089 Other specified glaucoma: Secondary | ICD-10-CM | POA: Diagnosis not present

## 2015-11-07 DIAGNOSIS — H11442 Conjunctival cysts, left eye: Secondary | ICD-10-CM | POA: Diagnosis not present

## 2015-11-07 DIAGNOSIS — H2701 Aphakia, right eye: Secondary | ICD-10-CM | POA: Diagnosis not present

## 2015-11-07 DIAGNOSIS — H10412 Chronic giant papillary conjunctivitis, left eye: Secondary | ICD-10-CM | POA: Diagnosis not present

## 2015-11-09 ENCOUNTER — Telehealth: Payer: Self-pay | Admitting: Family

## 2015-11-10 MED ORDER — OXYCODONE-ACETAMINOPHEN 5-325 MG PO TABS: 1.0000 | ORAL_TABLET | Freq: Two times a day (BID) | ORAL | Status: DC | PRN

## 2015-11-10 NOTE — Telephone Encounter (Signed)
RX ready for pick up 

## 2015-11-10 NOTE — Telephone Encounter (Signed)
Left detailed message stating rx is ready for pickup.

## 2015-11-13 ENCOUNTER — Other Ambulatory Visit: Payer: Self-pay | Admitting: Family Medicine

## 2015-11-13 ENCOUNTER — Other Ambulatory Visit: Payer: Self-pay | Admitting: Family

## 2015-11-14 ENCOUNTER — Other Ambulatory Visit: Payer: Self-pay | Admitting: Nurse Practitioner

## 2015-11-17 ENCOUNTER — Other Ambulatory Visit: Payer: Self-pay | Admitting: Family Medicine

## 2015-11-18 ENCOUNTER — Telehealth: Payer: Self-pay | Admitting: Family

## 2015-11-21 ENCOUNTER — Other Ambulatory Visit: Payer: Self-pay | Admitting: Family

## 2015-11-21 MED ORDER — METFORMIN HCL 1000 MG PO TABS: ORAL_TABLET | ORAL | Status: DC

## 2015-11-21 NOTE — Telephone Encounter (Signed)
Patient must have a follow up appointment per Tammy.

## 2015-11-21 NOTE — Telephone Encounter (Signed)
Patient aware that he has to be seen.

## 2015-11-21 NOTE — Telephone Encounter (Signed)
Metformin Prescription sent to pharmacy. Please give pt toujeo samples

## 2015-11-21 NOTE — Telephone Encounter (Signed)
Please advise 

## 2015-11-24 ENCOUNTER — Other Ambulatory Visit: Payer: Self-pay | Admitting: Family Medicine

## 2015-11-28 ENCOUNTER — Ambulatory Visit: Payer: Medicare Other | Admitting: Family

## 2015-11-30 ENCOUNTER — Other Ambulatory Visit: Payer: Self-pay | Admitting: Family

## 2015-11-30 ENCOUNTER — Other Ambulatory Visit: Payer: Self-pay | Admitting: Family Medicine

## 2015-11-30 ENCOUNTER — Other Ambulatory Visit: Payer: Self-pay | Admitting: Nurse Practitioner

## 2015-12-01 NOTE — Telephone Encounter (Signed)
rx called into pharmacy

## 2015-12-01 NOTE — Telephone Encounter (Signed)
Last seen 08/31/15  [name]   Last Vit D 12/12/14   29.3

## 2015-12-01 NOTE — Telephone Encounter (Signed)
Last seen 08/31/15  [name]  If approved route to [facility] 

## 2015-12-04 ENCOUNTER — Encounter: Payer: Self-pay | Admitting: Family Medicine

## 2015-12-04 ENCOUNTER — Telehealth: Payer: Self-pay | Admitting: Family Medicine

## 2015-12-04 ENCOUNTER — Ambulatory Visit (INDEPENDENT_AMBULATORY_CARE_PROVIDER_SITE_OTHER): Payer: Medicare Other | Admitting: Family Medicine

## 2015-12-04 VITALS — BP 153/76 | HR 80 | Temp 98.7°F | Ht 68.0 in | Wt 221.6 lb

## 2015-12-04 DIAGNOSIS — M791 Myalgia: Secondary | ICD-10-CM

## 2015-12-04 DIAGNOSIS — E559 Vitamin D deficiency, unspecified: Secondary | ICD-10-CM | POA: Diagnosis not present

## 2015-12-04 DIAGNOSIS — E118 Type 2 diabetes mellitus with unspecified complications: Secondary | ICD-10-CM

## 2015-12-04 DIAGNOSIS — I10 Essential (primary) hypertension: Secondary | ICD-10-CM

## 2015-12-04 DIAGNOSIS — M7918 Myalgia, other site: Secondary | ICD-10-CM

## 2015-12-04 DIAGNOSIS — E785 Hyperlipidemia, unspecified: Secondary | ICD-10-CM | POA: Diagnosis not present

## 2015-12-04 MED ORDER — INSULIN GLARGINE 300 UNIT/ML ~~LOC~~ SOPN: 66.0000 [IU] | PEN_INJECTOR | Freq: Every day | SUBCUTANEOUS | Status: DC

## 2015-12-04 MED ORDER — TRAMADOL HCL 50 MG PO TABS: 50.0000 mg | ORAL_TABLET | Freq: Three times a day (TID) | ORAL | Status: DC | PRN
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
## 2015-12-04 NOTE — Patient Instructions (Signed)
Great to meet you!  Start you blood pressure medicine back like this:  Amlodipine 1 pll daily 1 week later start hydrochlorothiazide 1 week later start ramapril  You can start lipitor right away  Please see Tammy for diabetes discussion within 2-4 weeks.   Please come back to see me next month  We need to discuss your diabetes and hypertension next time you come in.

## 2015-12-04 NOTE — Telephone Encounter (Signed)
Left detailed message that samples would be waiting at office for him to pick up.

## 2015-12-05 LAB — LIPID PANEL
Chol/HDL Ratio: 2.9 ratio units (ref 0.0–5.0)
Cholesterol, Total: 187 mg/dL (ref 100–199)
HDL: 64 mg/dL (ref >39)
LDL Calculated: 101 mg/dL — ABNORMAL HIGH (ref 0–99)
Triglycerides: 109 mg/dL (ref 0–149)
VLDL Cholesterol Cal: 22 mg/dL (ref 5–40)

## 2015-12-05 LAB — VITAMIN D 25 HYDROXY (VIT D DEFICIENCY, FRACTURES): VIT D 25 HYDROXY: 12.4 ng/mL — AB (ref 30.0–100.0)

## 2015-12-05 LAB — CMP14+EGFR
ALK PHOS: 71 IU/L (ref 39–117)
ALT: 72 IU/L — ABNORMAL HIGH (ref 0–44)
AST: 84 IU/L — AB (ref 0–40)
Albumin/Globulin Ratio: 1.8 (ref 1.1–2.5)
Albumin: 4.2 g/dL (ref 3.5–4.7)
BILIRUBIN TOTAL: 0.3 mg/dL (ref 0.0–1.2)
BUN/Creatinine Ratio: 11 (ref 10–22)
BUN: 8 mg/dL (ref 8–27)
CALCIUM: 9.3 mg/dL (ref 8.6–10.2)
CHLORIDE: 100 mmol/L (ref 97–106)
CO2: 23 mmol/L (ref 18–29)
Creatinine, Ser: 0.73 mg/dL — ABNORMAL LOW (ref 0.76–1.27)
GFR calc Af Amer: 101 mL/min/{1.73_m2} (ref >59)
GFR, EST NON AFRICAN AMERICAN: 88 mL/min/{1.73_m2} (ref >59)
GLUCOSE: 141 mg/dL — AB (ref 65–99)
Globulin, Total: 2.4 g/dL (ref 1.5–4.5)
POTASSIUM: 4 mmol/L (ref 3.5–5.2)
SODIUM: 139 mmol/L (ref 136–144)
TOTAL PROTEIN: 6.6 g/dL (ref 6.0–8.5)

## 2015-12-05 LAB — CBC
Hematocrit: 41.2 % (ref 37.5–51.0)
Hemoglobin: 14.2 g/dL (ref 12.6–17.7)
MCH: 29.5 pg (ref 26.6–33.0)
MCHC: 34.5 g/dL (ref 31.5–35.7)
MCV: 86 fL (ref 79–97)
PLATELETS: 339 10*3/uL (ref 150–379)
RBC: 4.82 x10E6/uL (ref 4.14–5.80)
RDW: 14.9 % (ref 12.3–15.4)
WBC: 6.9 10*3/uL (ref 3.4–10.8)

## 2015-12-05 LAB — MICROALBUMIN / CREATININE URINE RATIO
Creatinine, Urine: 72.9 mg/dL
MICROALB/CREAT RATIO: 80.4 mg/g{creat} — AB (ref 0.0–30.0)
MICROALBUM., U, RANDOM: 58.6 ug/mL

## 2015-12-08 ENCOUNTER — Other Ambulatory Visit: Payer: Self-pay | Admitting: Family

## 2015-12-08 NOTE — Telephone Encounter (Signed)
Go ahead and forward this to [facility] and she gets back. She should be back on Monday. Caryl Pina, MD [facility] 12/08/2015, 12:53 PM

## 2015-12-08 NOTE — Telephone Encounter (Signed)
[name] last wrote on 11/10/15, saw Wendi Snipes on 12/04/15

## 2015-12-13 ENCOUNTER — Ambulatory Visit: Payer: Medicare Other | Admitting: Pharmacist

## 2015-12-14 ENCOUNTER — Telehealth: Payer: Self-pay | Admitting: Family

## 2015-12-14 MED ORDER — OXYCODONE-ACETAMINOPHEN 5-325 MG PO TABS: 1.0000 | ORAL_TABLET | Freq: Two times a day (BID) | ORAL | Status: DC | PRN

## 2015-12-14 NOTE — Telephone Encounter (Signed)
Please give samples and pain medication rx ready for pick up

## 2015-12-14 NOTE — Telephone Encounter (Signed)
Left detailed message that rx and samples ready for pickup to CB with any further questions or concerns.

## 2015-12-26 DIAGNOSIS — B351 Tinea unguium: Secondary | ICD-10-CM | POA: Diagnosis not present

## 2015-12-26 DIAGNOSIS — E1142 Type 2 diabetes mellitus with diabetic polyneuropathy: Secondary | ICD-10-CM | POA: Diagnosis not present

## 2015-12-26 DIAGNOSIS — L84 Corns and callosities: Secondary | ICD-10-CM | POA: Diagnosis not present

## 2016-01-03 ENCOUNTER — Telehealth: Payer: Self-pay | Admitting: Family

## 2016-01-03 NOTE — Telephone Encounter (Signed)
Patient called back.  He wanted to wait to see if we receive Toujeo in the next week.  He has about 1 weeks worth of Toujeo left.

## 2016-01-03 NOTE — Telephone Encounter (Signed)
[facility] - cost of insulin for patient would currently be $215. This is probably a deductible that he must meet at the beginning of each year.  We do not currently have any samples of Toujeo.   Tried to called patient discuss alternative (might be able to substitute Basaglar).  No Answer - LM for patient to CB

## 2016-01-08 ENCOUNTER — Telehealth: Payer: Self-pay | Admitting: Pharmacist

## 2016-01-08 NOTE — Telephone Encounter (Signed)
Left detailed message that we don't have any samples of Toujeo and to CB with any further questions or concerns.

## 2016-01-08 NOTE — Telephone Encounter (Signed)
Still no samples of Toujeo available

## 2016-01-09 ENCOUNTER — Telehealth: Payer: Self-pay | Admitting: Family

## 2016-01-09 NOTE — Telephone Encounter (Signed)
Patient aware that we are out of [place]. Patients wants to know if it can be changed to something else?

## 2016-01-10 ENCOUNTER — Telehealth: Payer: Self-pay | Admitting: Family

## 2016-01-10 MED ORDER — INSULIN GLARGINE 100 UNIT/ML SOLOSTAR PEN: 66.0000 [IU] | PEN_INJECTOR | Freq: Every day | SUBCUTANEOUS | Status: DC

## 2016-01-10 NOTE — Telephone Encounter (Signed)
Left messsage on patient's phone - #1 sample of Basaglar left in refidge for patient.  He is to use in place of  long acting insulin (Toujeo, Lantus Levemir) 66 units once daily.

## 2016-01-10 NOTE — Telephone Encounter (Signed)
Last filled 12/14/15, last seen 12/04/15.

## 2016-01-11 ENCOUNTER — Telehealth: Payer: Self-pay | Admitting: *Deleted

## 2016-01-11 MED ORDER — OXYCODONE-ACETAMINOPHEN 5-325 MG PO TABS: 1.0000 | ORAL_TABLET | Freq: Two times a day (BID) | ORAL | Status: DC | PRN

## 2016-01-11 NOTE — Telephone Encounter (Signed)
Defer to PCP.   Dale Apple, MD [facility] 01/11/2016, 7:57 AM

## 2016-01-11 NOTE — Telephone Encounter (Signed)
RX ready for pick up 

## 2016-01-11 NOTE — Telephone Encounter (Signed)
Patient notified that we did not have amy more samples for Toujeo and discussed using Basaglar.  He voiced understanding that he is to take Basaglar the same as other long acting insulins - inject 66 units once daily.

## 2016-01-12 ENCOUNTER — Other Ambulatory Visit: Payer: Self-pay | Admitting: Family

## 2016-02-06 ENCOUNTER — Other Ambulatory Visit: Payer: Self-pay | Admitting: Family

## 2016-02-06 NOTE — Telephone Encounter (Signed)
[facility].

## 2016-02-06 NOTE — Telephone Encounter (Signed)
Last seen 12/04/15  Dr Wendi Snipes  If approved route to nurse [facility]

## 2016-02-26 ENCOUNTER — Telehealth: Payer: Self-pay | Admitting: Pharmacist

## 2016-02-26 MED ORDER — INSULIN GLARGINE 300 UNIT/ML ~~LOC~~ SOPN: 66.0000 [IU] | PEN_INJECTOR | Freq: Every day | SUBCUTANEOUS | Status: DC

## 2016-02-26 NOTE — Telephone Encounter (Signed)
Patient NTBS to have A1c checked - this is the last insulin sample if he does not come in.  Appt made for 03/08/16 at 11:15am.  Left message for patient about sample and appt.

## 2016-02-28 ENCOUNTER — Other Ambulatory Visit: Payer: Self-pay | Admitting: Family Medicine

## 2016-03-06 ENCOUNTER — Telehealth: Payer: Self-pay | Admitting: Pharmacist

## 2016-03-06 NOTE — Telephone Encounter (Signed)
Patient has appt 03/08/16 - called to see if he has enough Toujeo to last until appt. Patient really need A1c checked.  Left message on VM.

## 2016-03-08 ENCOUNTER — Encounter: Payer: Self-pay | Admitting: Pharmacist

## 2016-03-08 ENCOUNTER — Ambulatory Visit (INDEPENDENT_AMBULATORY_CARE_PROVIDER_SITE_OTHER): Payer: Medicare Other | Admitting: Pharmacist

## 2016-03-08 VITALS — BP 170/70 | HR 72 | Ht 68.0 in | Wt 234.0 lb

## 2016-03-08 DIAGNOSIS — R809 Proteinuria, unspecified: Secondary | ICD-10-CM | POA: Diagnosis not present

## 2016-03-08 DIAGNOSIS — E119 Type 2 diabetes mellitus without complications: Secondary | ICD-10-CM | POA: Diagnosis not present

## 2016-03-08 DIAGNOSIS — I10 Essential (primary) hypertension: Secondary | ICD-10-CM | POA: Diagnosis not present

## 2016-03-08 DIAGNOSIS — E1129 Type 2 diabetes mellitus with other diabetic kidney complication: Secondary | ICD-10-CM | POA: Diagnosis not present

## 2016-03-08 DIAGNOSIS — Z794 Long term (current) use of insulin: Secondary | ICD-10-CM | POA: Diagnosis not present

## 2016-03-08 DIAGNOSIS — E785 Hyperlipidemia, unspecified: Secondary | ICD-10-CM

## 2016-03-08 LAB — BAYER DCA HB A1C WAIVED: HB A1C: 6.9 % (ref <7.0)

## 2016-03-08 MED ORDER — METFORMIN HCL 1000 MG PO TABS: 500.0000 mg | ORAL_TABLET | Freq: Two times a day (BID) | ORAL | Status: DC
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
## 2016-03-22 ENCOUNTER — Telehealth: Payer: Self-pay | Admitting: Pharmacist

## 2016-03-22 MED ORDER — BASAGLAR KWIKPEN 100 UNIT/ML ~~LOC~~ SOPN: 66.0000 [IU] | PEN_INJECTOR | Freq: Every day | SUBCUTANEOUS | Status: DC

## 2016-03-22 NOTE — Telephone Encounter (Signed)
No lantus or toujeo but I had #1 Basaglar.  #1 sample left and patient notified

## 2016-03-27 ENCOUNTER — Telehealth: Payer: Self-pay | Admitting: Pharmacist

## 2016-03-28 MED ORDER — INSULIN GLARGINE 300 UNIT/ML ~~LOC~~ SOPN: 66.0000 [IU] | PEN_INJECTOR | Freq: Every day | SUBCUTANEOUS | Status: DC

## 2016-03-28 NOTE — Telephone Encounter (Signed)
#  2 samples left for patient in refridge

## 2016-04-03 DIAGNOSIS — Z029 Encounter for administrative examinations, unspecified: Secondary | ICD-10-CM

## 2016-04-08 ENCOUNTER — Telehealth: Payer: Self-pay | Admitting: *Deleted

## 2016-04-08 NOTE — Telephone Encounter (Signed)
Two boxes of insulin are available for patient.

## 2016-04-16 ENCOUNTER — Other Ambulatory Visit: Payer: Self-pay | Admitting: Family

## 2016-04-18 ENCOUNTER — Other Ambulatory Visit: Payer: Self-pay | Admitting: Family

## 2016-04-19 ENCOUNTER — Telehealth: Payer: Self-pay | Admitting: Pharmacist

## 2016-04-19 MED ORDER — INSULIN GLARGINE 300 UNIT/ML ~~LOC~~ SOPN: 66.0000 [IU] | PEN_INJECTOR | Freq: Every day | SUBCUTANEOUS | Status: DC

## 2016-04-19 NOTE — Telephone Encounter (Signed)
SAMPLES LEFT FOR PATIENT

## 2016-04-26 ENCOUNTER — Other Ambulatory Visit: Payer: Self-pay | Admitting: *Deleted

## 2016-04-26 ENCOUNTER — Ambulatory Visit (INDEPENDENT_AMBULATORY_CARE_PROVIDER_SITE_OTHER): Payer: Medicare Other | Admitting: Family Medicine

## 2016-04-26 ENCOUNTER — Encounter: Payer: Self-pay | Admitting: Family Medicine

## 2016-04-26 VITALS — BP 182/86 | HR 77 | Temp 98.3°F | Ht 68.0 in | Wt 231.2 lb

## 2016-04-26 DIAGNOSIS — L723 Sebaceous cyst: Secondary | ICD-10-CM

## 2016-04-26 MED ORDER — DIAZEPAM 5 MG PO TABS: 5.0000 mg | ORAL_TABLET | Freq: Two times a day (BID) | ORAL | Status: DC | PRN

## 2016-04-26 NOTE — Progress Notes (Signed)
Pt called back in needing a refill on his diazepam sent to [facility] since [facility] are down. Refill #60 without additional refills called to [facility] as approved by Dr. Sabra Heck

## 2016-04-26 NOTE — Progress Notes (Signed)
   Subjective:    Patient ID: [name], male    DOB: 01/12/1933, 82 y.o.   MRN: [id number]  HPI chief complaint is a "bite" on his back. He rubs some alcohol yesterday but is centrally located between the scapula and hard for him to  access.  Patient Active Problem List   Diagnosis Date Noted  . Vitamin D insufficiency 10/13/2013  . DOE (dyspnea on exertion) 04/22/2013  . Candida esophagitis ([place]) 05/14/2012  . Helicobacter pylori gastritis 05/14/2012  . Screening for colon cancer 04/16/2012  . GERD (gastroesophageal reflux disease) 04/15/2012  . Seizure disorder ([place]) 03/26/2012  . Glaucoma primary, open angle 03/23/2012  . Anxiety 03/23/2012  . Syncope and collapse 02/26/2012  . Diabetes mellitus ([place]) 02/26/2012  . Essential hypertension, benign 02/26/2012  . Hyperlipidemia 02/26/2012   Outpatient Encounter Prescriptions as of 04/26/2016  Medication Sig  . amLODipine (NORVASC) 10 MG tablet TAKE 1 TABLET (10 MG TOTAL) BY MOUTH DAILY.  [name] aspirin 81 MG tablet Take 81 mg by mouth daily. Reported on 03/08/2016  . atorvastatin (LIPITOR) 20 MG tablet TAKE 1 TABLET (20 MG TOTAL) BY MOUTH DAILY AT 6 PM.  . BD PEN NEEDLE NANO U/F 32G X 4 MM MISC USE AS DIRECTED BY MD  . diazepam (VALIUM) 5 MG tablet TAKE 1 TABLET TWICE A DAY (Patient taking differently: TAKE 1 TABLET TWICE A DAY as needed)  . hydrochlorothiazide (MICROZIDE) 12.5 MG capsule Take 1 capsule (12.5 mg total) by mouth daily.  . Insulin Glargine (TOUJEO SOLOSTAR) 300 UNIT/ML SOPN Inject 66 Units into the skin daily.  . insulin glulisine (APIDRA) 100 UNIT/ML injection Inject 20 Units into the skin daily with breakfast. Reported on 03/08/2016  . latanoprost (XALATAN) 0.005 % ophthalmic solution INSTILL 1 DROP INTO THE RIGHT EYE AT BEDTIME  . meloxicam (MOBIC) 15 MG tablet TAKE 1 TABLET (15 MG TOTAL) BY MOUTH DAILY.  . metFORMIN (GLUCOPHAGE) 1000 MG tablet Take 0.5 tablets (500 mg total) by mouth 2 (two) times daily with a meal.  TAKE 1 TABLET (1,000 MG TOTAL) BY MOUTH 2 (TWO) TIMES DAILY WITH A MEAL.  [name] ONE TOUCH ULTRA TEST test strip USE FOR TESTING TWICE A DAY  . ONETOUCH DELICA LANCETS 99991111 MISC 1 STICK BY DOES NOT APPLY ROUTE 2 (TWO) TIMES DAILY.  [name] oxyCODONE-acetaminophen (ROXICET) 5-325 MG tablet Take 1 tablet by mouth every 12 (twelve) hours as needed for severe pain.  [name] Propylene Glycol (SYSTANE BALANCE) 0.6 % SOLN Apply 2 drops to eye daily as needed. Reported on 03/08/2016  . ramipril (ALTACE) 10 MG capsule TAKE 1 CAPSULE BY MOUTH ONCE DAILY  . Vitamin D, Ergocalciferol, (DRISDOL) 50000 UNITS CAPS capsule TAKE ONE CAPSULE ONCE A WEEK   No facility-administered encounter medications on file as of 04/26/2016.      Review of Systems  Skin: Positive for rash.       Objective:   Physical Exam  Skin:  The area in question is an inflamed sebaceous cyst. At this point it is not large but more like a closed comedone. This was opened up with the comedone extractor and the C Boehmer contents expressed. Area was dressed with a simple Band-Aid and triple antibiotic dressing. Ask him to soak in warm bathtub at least once a day for 10 or 15 minutes as a form of follow-up care.          Assessment & Plan:

## 2016-05-01 ENCOUNTER — Other Ambulatory Visit: Payer: Self-pay | Admitting: Family

## 2016-05-01 NOTE — Telephone Encounter (Signed)
Patient notified no samples available. Advised to check back in a few days

## 2016-05-06 ENCOUNTER — Telehealth: Payer: Self-pay | Admitting: Pharmacist

## 2016-05-07 NOTE — Telephone Encounter (Signed)
Left message stating we are currently out of Toujeo samples and Tammy is not in the office today so I'm not sure what else I could give him but will forward the message to Tammy and will call him once she advises.

## 2016-05-08 MED ORDER — BASAGLAR KWIKPEN 100 UNIT/ML ~~LOC~~ SOPN: 66.0000 [IU] | PEN_INJECTOR | Freq: Every day | SUBCUTANEOUS | Status: DC

## 2016-05-08 NOTE — Telephone Encounter (Signed)
left #2 pens of Basaglar for patient to pick up.  Patient aware.

## 2016-05-17 ENCOUNTER — Telehealth: Payer: Self-pay | Admitting: Family

## 2016-05-17 MED ORDER — INSULIN GLARGINE 300 UNIT/ML ~~LOC~~ SOPN: 66.0000 [IU] | PEN_INJECTOR | Freq: Every day | SUBCUTANEOUS | Status: DC

## 2016-05-17 NOTE — Telephone Encounter (Signed)
#  2 pens of Toujeo left for patient to pick up. LM on VM to notify patient

## 2016-05-31 ENCOUNTER — Telehealth: Payer: Self-pay | Admitting: Family

## 2016-05-31 MED ORDER — INSULIN GLARGINE 300 UNIT/ML ~~LOC~~ SOPN: 66.0000 [IU] | PEN_INJECTOR | Freq: Every day | SUBCUTANEOUS | Status: DC

## 2016-05-31 NOTE — Telephone Encounter (Signed)
#  2 pens of Toujeo samples left for patient at front desk.  Patient notified.

## 2016-06-05 ENCOUNTER — Other Ambulatory Visit: Payer: Self-pay | Admitting: Nurse Practitioner

## 2016-06-05 ENCOUNTER — Other Ambulatory Visit: Payer: Self-pay | Admitting: Family

## 2016-06-05 ENCOUNTER — Other Ambulatory Visit: Payer: Self-pay | Admitting: Family Medicine

## 2016-06-05 ENCOUNTER — Ambulatory Visit (INDEPENDENT_AMBULATORY_CARE_PROVIDER_SITE_OTHER): Payer: Medicare Other | Admitting: Physician Assistant

## 2016-06-05 ENCOUNTER — Encounter: Payer: Self-pay | Admitting: Physician Assistant

## 2016-06-05 VITALS — BP 205/95 | HR 75 | Temp 98.3°F | Ht 68.0 in | Wt 232.0 lb

## 2016-06-05 DIAGNOSIS — Z719 Counseling, unspecified: Secondary | ICD-10-CM | POA: Diagnosis not present

## 2016-06-05 NOTE — Progress Notes (Signed)
Subjective:     Patient ID: [name], male   DOB: 1933-05-22, 82 y.o.   MRN: [id number]  HPI Pt wanting a form filled out for his credit card company saying he is totally disabled States he has been disabled for years due to his multiple medical dx States he has been ut of work for years When I asked if he was on disability he says no He has never applied for disability   Review of Systems     Objective:   Physical Exam Defer Spent 70min with pt face to face    Assessment:     1. Encounter for consultation        Plan:     Informed pt I could not fill out the papers today stating he was disabled according to their guidelines Recommended he follow up with the SS admin about getting on disability and the process he must follow F/U here regarding his diabetes and HTN

## 2016-06-06 NOTE — Telephone Encounter (Signed)
Last seen 06/05/16 Lorenza Evangelist PCP  Last Vit D 12/04/15  12.4  Last lipid 12/04/15

## 2016-06-12 ENCOUNTER — Telehealth: Payer: Self-pay | Admitting: Family

## 2016-06-13 MED ORDER — INSULIN GLARGINE 300 UNIT/ML ~~LOC~~ SOPN: 66.0000 [IU] | PEN_INJECTOR | Freq: Every day | SUBCUTANEOUS | Status: DC

## 2016-06-13 NOTE — Telephone Encounter (Signed)
#  2 pens of Toujeo samples left for patient - patient notified

## 2016-06-18 ENCOUNTER — Ambulatory Visit (INDEPENDENT_AMBULATORY_CARE_PROVIDER_SITE_OTHER): Payer: Medicare Other | Admitting: Pharmacist

## 2016-06-18 ENCOUNTER — Encounter: Payer: Self-pay | Admitting: Pharmacist

## 2016-06-18 VITALS — BP 142/82 | HR 78 | Ht 68.0 in | Wt 233.0 lb

## 2016-06-18 DIAGNOSIS — Z0000 Encounter for general adult medical examination without abnormal findings: Secondary | ICD-10-CM | POA: Diagnosis not present

## 2016-06-18 MED ORDER — METFORMIN HCL ER 500 MG PO TB24: 500.0000 mg | ORAL_TABLET | Freq: Every day | ORAL | Status: DC
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
## 2016-06-18 NOTE — Patient Instructions (Addendum)
  [name] , Thank you for taking time to come for your Medicare Wellness Visit. I appreciate your ongoing commitment to your health goals. Please review the following plan we discussed and let me know if I can assist you in the future.   These are the goals we discussed:  Try metformin ER 500mg  take 1 tablet daily Exercise daily - goal is 150 minutes per week.  Increase non-starchy vegetables - carrots, green bean, squash, zucchini, tomatoes, onions, peppers, spinach and other green leafy vegetables, cabbage, lettuce, cucumbers, asparagus, okra (not fried), eggplant Limit sugar and processed foods (cakes, cookies, ice cream, crackers and chips) Increase fresh fruit but limit serving sizes 1/2 cup or about the size of tennis or baseball Limit red meat to no more than 1-2 times per week (serving size about the size of your palm) Choose whole grains / lean proteins - whole wheat bread, quinoa, whole grain rice (1/2 cup), fish, chicken, [place] Avoid sugar and calorie containing beverages - soda, sweet tea and juice.  Choose water or unsweetened tea instead.   This is a list of the screening recommended for you and due dates:  Health Maintenance  Topic Date Due  . Eye exam for diabetics  07/30/2016*  . Tetanus Vaccine  12/30/2016*  . Shingles Vaccine  12/31/2016*  . Flu Shot  07/30/2016  . Hemoglobin A1C  09/08/2016  . Complete foot exam   06/18/2017  . Pneumonia vaccines  Completed  *Topic was postponed. The date shown is not the original due date.

## 2016-07-04 ENCOUNTER — Telehealth: Payer: Self-pay | Admitting: Pharmacist

## 2016-07-04 NOTE — Telephone Encounter (Signed)
No sample of Toujeo or other long acting insulin to substitute.

## 2016-07-08 ENCOUNTER — Telehealth: Payer: Self-pay | Admitting: Family

## 2016-07-09 ENCOUNTER — Telehealth: Payer: Self-pay

## 2016-07-09 ENCOUNTER — Telehealth: Payer: Self-pay | Admitting: Family

## 2016-07-09 MED ORDER — INSULIN DEGLUDEC 100 UNIT/ML ~~LOC~~ SOPN: 54.0000 [IU] | PEN_INJECTOR | Freq: Every day | SUBCUTANEOUS | Status: DC

## 2016-07-09 NOTE — Telephone Encounter (Signed)
No samples of Toujeo available.  I did have [place].  Patient aware to decrease to 54 units daily of Tresiba.

## 2016-07-09 NOTE — Telephone Encounter (Signed)
I am getting a prior auth. For [name]  Is he still taking this because on your last visit seeing him I don't see it on the list of his meds?

## 2016-07-09 NOTE — Telephone Encounter (Signed)
See previous call from yesterday -

## 2016-07-09 NOTE — Telephone Encounter (Signed)
No - tell pharmacy to disregard Rx for [name] - he has given a sample and accidentally sent in Rx.  Patient cannot afford copay for insulin so no need to get PA.

## 2016-07-09 NOTE — Telephone Encounter (Signed)
Pt given appt with [name] 7/13 at 9:40.

## 2016-07-11 ENCOUNTER — Telehealth: Payer: Self-pay | Admitting: Family

## 2016-07-11 ENCOUNTER — Encounter: Payer: Self-pay | Admitting: Family

## 2016-07-11 ENCOUNTER — Ambulatory Visit (INDEPENDENT_AMBULATORY_CARE_PROVIDER_SITE_OTHER): Payer: Medicare Other | Admitting: Family

## 2016-07-11 VITALS — BP 137/79 | HR 103 | Temp 98.0°F | Ht 68.0 in | Wt 235.0 lb

## 2016-07-11 DIAGNOSIS — G8929 Other chronic pain: Secondary | ICD-10-CM | POA: Diagnosis not present

## 2016-07-11 DIAGNOSIS — M549 Dorsalgia, unspecified: Secondary | ICD-10-CM

## 2016-07-11 MED ORDER — BACLOFEN 10 MG PO TABS: 10.0000 mg | ORAL_TABLET | Freq: Three times a day (TID) | ORAL | Status: DC

## 2016-07-11 MED ORDER — CELECOXIB 100 MG PO CAPS: 100.0000 mg | ORAL_CAPSULE | Freq: Two times a day (BID) | ORAL | Status: DC

## 2016-07-11 MED ORDER — INSULIN GLARGINE 300 UNIT/ML ~~LOC~~ SOPN: 66.0000 [IU] | PEN_INJECTOR | Freq: Every day | SUBCUTANEOUS | Status: DC

## 2016-07-11 NOTE — Progress Notes (Signed)
   Subjective:    Patient ID: [name], male    DOB: [birth year], 82 y.o.   MRN: [id number]  Back Pain This is a chronic problem. The current episode started more than 1 year ago. The problem occurs constantly. The problem is unchanged. The pain is present in the lumbar spine. The quality of the pain is described as aching. The pain is at a severity of 10/10. The pain is severe. The symptoms are aggravated by bending and standing. Associated symptoms include weakness. Pertinent negatives include no bladder incontinence, bowel incontinence, leg pain, numbness or tingling. Risk factors include obesity, lack of exercise and sedentary lifestyle. He has tried analgesics and NSAIDs for the symptoms. The treatment provided mild relief.      Review of Systems  HENT: Negative.   Respiratory: Negative.   Cardiovascular: Negative.   Gastrointestinal: Negative.  Negative for bowel incontinence.  Endocrine: Negative.   Genitourinary: Negative.  Negative for bladder incontinence.  Musculoskeletal: Positive for back pain.  Neurological: Positive for weakness. Negative for tingling and numbness.  Hematological: Negative.   Psychiatric/Behavioral: Negative.   All other systems reviewed and are negative.      Objective:   Physical Exam  Constitutional: He is oriented to person, place, and time. He appears well-developed and well-nourished. No distress.  HENT:  Head: Normocephalic.  Eyes: Pupils are equal, round, and reactive to light. Right eye exhibits no discharge. Left eye exhibits no discharge.  Neck: Normal range of motion. Neck supple. No thyromegaly present.  Cardiovascular: Normal rate, regular rhythm, normal heart sounds and intact distal pulses.   No murmur heard. Pulmonary/Chest: Effort normal and breath sounds normal. No respiratory distress. He has no wheezes.  Abdominal: Soft. Bowel sounds are normal. He exhibits no distension. There is no tenderness.  Musculoskeletal: He exhibits  no edema or tenderness.  Slow movements with flexion and extension related to pain   Neurological: He is alert and oriented to person, place, and time.  Skin: Skin is warm and dry. No rash noted. No erythema.  Psychiatric: He has a normal mood and affect. His behavior is normal. Judgment and thought content normal.  Vitals reviewed.     BP 137/79 mmHg  Pulse 103  Temp(Src) 98 F (36.7 C) (Oral)  Ht 5\' 8"  (1.727 m)  Wt 235 lb (106.595 kg)  BMI 35.74 kg/m2     Assessment & Plan:  1. Chronic back pain -ROM exercises encouraged -Pt taking aleve at home and tolerating well, do not take Celebrex and aleve together -RTO prn - baclofen (LIORESAL) 10 MG tablet; Take 1 tablet (10 mg total) by mouth 3 (three) times daily.  Dispense: 30 each; Refill: 0 - celecoxib (CELEBREX) 100 MG capsule; Take 1 capsule (100 mg total) by mouth 2 (two) times daily.  Dispense: 60 capsule; Refill: [place], FNP

## 2016-07-11 NOTE — Patient Instructions (Signed)

## 2016-07-11 NOTE — Telephone Encounter (Signed)
#  1 samples given

## 2016-07-12 MED ORDER — MELOXICAM 7.5 MG PO TABS: 7.5000 mg | ORAL_TABLET | Freq: Every day | ORAL | Status: DC

## 2016-07-12 NOTE — Telephone Encounter (Signed)
Discussed with [name], NP. Changed to meloxicam.  Patient aware

## 2016-07-12 NOTE — Telephone Encounter (Signed)
Mobic Prescription sent to pharmacy   

## 2016-07-15 ENCOUNTER — Other Ambulatory Visit: Payer: Medicare Other

## 2016-07-15 ENCOUNTER — Telehealth: Payer: Self-pay | Admitting: Family

## 2016-07-15 DIAGNOSIS — Z139 Encounter for screening, unspecified: Secondary | ICD-10-CM | POA: Diagnosis not present

## 2016-07-15 NOTE — Telephone Encounter (Signed)
No samples currently - he just got some 7/13

## 2016-07-15 NOTE — Telephone Encounter (Signed)
lmtcb

## 2016-07-15 NOTE — Addendum Note (Signed)
Addended by: Earlene Plater on: 07/15/2016 03:37 PM   Modules accepted: Orders

## 2016-07-15 NOTE — Telephone Encounter (Signed)
Patient aware no samples at this time. Will check back.

## 2016-07-17 ENCOUNTER — Telehealth: Payer: Self-pay | Admitting: Pharmacist

## 2016-07-17 LAB — FECAL OCCULT BLOOD, IMMUNOCHEMICAL: Fecal Occult Bld: NEGATIVE

## 2016-07-18 MED ORDER — BASAGLAR KWIKPEN 100 UNIT/ML ~~LOC~~ SOPN: 66.0000 [IU] | PEN_INJECTOR | Freq: Every day | SUBCUTANEOUS | Status: DC

## 2016-07-18 NOTE — Telephone Encounter (Signed)
Left #1 sample for Basaglar

## 2016-07-25 ENCOUNTER — Telehealth: Payer: Self-pay | Admitting: Family

## 2016-07-25 NOTE — Telephone Encounter (Signed)
#  1 sample of Tyler Aas was left for patient to pick up .  Left message for patient .

## 2016-07-29 ENCOUNTER — Telehealth: Payer: Self-pay | Admitting: Family

## 2016-07-30 MED ORDER — INSULIN GLARGINE 100 UNIT/ML SOLOSTAR PEN: 66.0000 [IU] | PEN_INJECTOR | Freq: Every day | SUBCUTANEOUS | 99 refills | Status: DC

## 2016-07-30 NOTE — Telephone Encounter (Signed)
#  2 samples of lantus solostar pens left for patinet

## 2016-08-01 ENCOUNTER — Other Ambulatory Visit: Payer: Self-pay | Admitting: Family Medicine

## 2016-08-01 ENCOUNTER — Encounter: Payer: Self-pay | Admitting: Family Medicine

## 2016-08-01 ENCOUNTER — Ambulatory Visit (INDEPENDENT_AMBULATORY_CARE_PROVIDER_SITE_OTHER): Payer: Medicare Other | Admitting: Family Medicine

## 2016-08-01 VITALS — BP 184/74 | HR 66 | Temp 97.4°F | Ht 68.0 in | Wt 235.0 lb

## 2016-08-01 DIAGNOSIS — I10 Essential (primary) hypertension: Secondary | ICD-10-CM | POA: Diagnosis not present

## 2016-08-01 MED ORDER — IRBESARTAN 150 MG PO TABS: 150.0000 mg | ORAL_TABLET | Freq: Every day | ORAL | 3 refills | Status: DC

## 2016-08-01 MED ORDER — HYDROCHLOROTHIAZIDE 12.5 MG PO CAPS: 25.0000 mg | ORAL_CAPSULE | Freq: Every day | ORAL | 3 refills | Status: DC

## 2016-08-01 NOTE — Progress Notes (Signed)
Subjective:    Patient ID: [name], male    DOB: Feb 21, 1933, 82 y.o.   MRN: [id number]  HPI Patient here today for headache and head pressure in the back of his head. Blood pressures on the other hand have not been well controlled and are typically in the 99991111 systolic range. There may be some issues with compliance as far as medicines. He is supposed to be on calcium channel blocker diuretic and ace inhibitor. This occipital headache is chronic. He has apparently had at least 2 head scans which showed no tumor or aneurysm    Patient Active Problem List   Diagnosis Date Noted  . Chronic back pain 07/11/2016  . Vitamin D insufficiency 10/13/2013  . DOE (dyspnea on exertion) 04/22/2013  . Candida esophagitis ([place]) 05/14/2012  . Helicobacter pylori gastritis 05/14/2012  . Screening for colon cancer 04/16/2012  . GERD (gastroesophageal reflux disease) 04/15/2012  . Seizure disorder ([place]) 03/26/2012  . Glaucoma primary, open angle 03/23/2012  . Anxiety 03/23/2012  . Syncope and collapse 02/26/2012  . Diabetes mellitus ([place]) 02/26/2012  . Essential hypertension, benign 02/26/2012  . Hyperlipidemia 02/26/2012   Outpatient Encounter Prescriptions as of 08/01/2016  Medication Sig  . amLODipine (NORVASC) 10 MG tablet TAKE 1 TABLET (10 MG TOTAL) BY MOUTH DAILY.  [name] aspirin 81 MG tablet Take 81 mg by mouth daily. Reported on 03/08/2016  . atorvastatin (LIPITOR) 20 MG tablet TAKE 1 TABLET (20 MG TOTAL) BY MOUTH DAILY AT 6 PM.  . baclofen (LIORESAL) 10 MG tablet Take 1 tablet (10 mg total) by mouth 3 (three) times daily.  . diazepam (VALIUM) 5 MG tablet Take 1 tablet (5 mg total) by mouth 2 (two) times daily as needed.  . hydrochlorothiazide (MICROZIDE) 12.5 MG capsule Take 1 capsule (12.5 mg total) by mouth daily.  . Insulin Glargine (LANTUS SOLOSTAR) 100 UNIT/ML Solostar Pen Inject 66 Units into the skin daily. Use on place of Toujeo or other long acting insulin  . insulin glulisine  (APIDRA) 100 UNIT/ML injection Inject 20 Units into the skin daily with breakfast. Reported on 03/08/2016  . latanoprost (XALATAN) 0.005 % ophthalmic solution INSTILL 1 DROP INTO THE RIGHT EYE AT BEDTIME  . meloxicam (MOBIC) 7.5 MG tablet Take 1 tablet (7.5 mg total) by mouth daily.  . metFORMIN (GLUCOPHAGE-XR) 500 MG 24 hr tablet Take 1 tablet (500 mg total) by mouth daily with breakfast.  . ONE TOUCH ULTRA TEST test strip USE FOR TESTING TWICE A DAY  . ONETOUCH DELICA LANCETS 99991111 MISC 1 STICK BY DOES NOT APPLY ROUTE 2 (TWO) TIMES DAILY.  [name] Propylene Glycol (SYSTANE BALANCE) 0.6 % SOLN Apply 2 drops to eye daily as needed. Reported on 03/08/2016  . ramipril (ALTACE) 10 MG capsule TAKE 1 CAPSULE BY MOUTH ONCE DAILY  . terazosin (HYTRIN) 5 MG capsule   . timolol (TIMOPTIC) 0.5 % ophthalmic solution INSTILL 1 DROP INTO RIGHT EYE EVERY MORING  . ULTICARE MICRO PEN NEEDLES 32G X 4 MM MISC USE AS DIRECTED BY MD  . Vitamin D, Ergocalciferol, (DRISDOL) 50000 units CAPS capsule TAKE ONE CAPSULE ONCE A WEEK   No facility-administered encounter medications on file as of 08/01/2016.       Review of Systems  Constitutional: Negative.   Eyes: Negative.   Respiratory: Negative.   Cardiovascular: Negative.   Gastrointestinal: Negative.   Endocrine: Negative.   Genitourinary: Negative.   Musculoskeletal: Negative.   Skin: Negative.   Allergic/Immunologic: Negative.   Neurological: Positive for  headaches (in the back of his head - pressure). Negative for dizziness and light-headedness.  Hematological: Negative.   Psychiatric/Behavioral: Negative.        Objective:   Physical Exam  Constitutional: He appears well-developed and well-nourished.  Eyes:  Optic fundi show hemorrhages and exudates and tiny veins. Unsure if this is the result of diabetes or blood pressure or some of both.  Cardiovascular: Normal rate and regular rhythm.   Pulmonary/Chest: Effort normal and breath sounds normal.   BP (!)  184/74 (BP Location: Left Arm)   Pulse 66   Temp 97.4 F (36.3 C) (Oral)   Ht 5\' 8"  (1.727 m)   Wt 235 lb (106.6 kg)   BMI 35.73 kg/m         Assessment & Plan:  1. Essential hypertension, benign I think it is likely that blood pressure and occipital headaches are related. Will try to adjust blood pressure medicines in this way: Increase hydrochlorothiazide from 12.5-25 mg per day. Discontinue ACE inhibitor and begin irbesartan 150 mg. Recheck in 2 weeks  Wardell Honour MD

## 2016-08-01 NOTE — Patient Instructions (Signed)
Pt is to: start Irbesartan 150 mg daily Stop the Ramipril And  Increase the HCTZ from 12.5 daily to 25 mg daily.  Follow up in 2 weeks

## 2016-08-06 ENCOUNTER — Telehealth: Payer: Self-pay | Admitting: Pharmacist

## 2016-08-07 MED ORDER — INSULIN DEGLUDEC 100 UNIT/ML ~~LOC~~ SOPN: 66.0000 [IU] | PEN_INJECTOR | Freq: Every day | SUBCUTANEOUS | 0 refills | Status: DC

## 2016-08-07 NOTE — Telephone Encounter (Signed)
No toujeo available - left #2 samples of Tresiba = take 68 units daily.  Patient notified.

## 2016-08-15 ENCOUNTER — Telehealth: Payer: Self-pay | Admitting: Family

## 2016-08-15 MED ORDER — INSULIN DEGLUDEC 100 UNIT/ML ~~LOC~~ SOPN: 66.0000 [IU] | PEN_INJECTOR | Freq: Every day | SUBCUTANEOUS | 0 refills | Status: AC

## 2016-08-15 NOTE — Telephone Encounter (Signed)
#  2 Tresiba samples left for patient to pick  Left message for patient that he can p/u at his convience.

## 2016-08-19 ENCOUNTER — Other Ambulatory Visit: Payer: Self-pay | Admitting: Family

## 2016-08-19 DIAGNOSIS — M549 Dorsalgia, unspecified: Principal | ICD-10-CM

## 2016-08-19 DIAGNOSIS — G8929 Other chronic pain: Secondary | ICD-10-CM

## 2016-08-20 ENCOUNTER — Other Ambulatory Visit: Payer: Self-pay | Admitting: Family

## 2016-08-20 DIAGNOSIS — M549 Dorsalgia, unspecified: Principal | ICD-10-CM

## 2016-08-20 DIAGNOSIS — G8929 Other chronic pain: Secondary | ICD-10-CM

## 2016-08-21 ENCOUNTER — Telehealth: Payer: Self-pay | Admitting: Family

## 2016-08-21 ENCOUNTER — Ambulatory Visit (INDEPENDENT_AMBULATORY_CARE_PROVIDER_SITE_OTHER): Payer: Medicare Other | Admitting: Family Medicine

## 2016-08-21 ENCOUNTER — Encounter: Payer: Self-pay | Admitting: Family Medicine

## 2016-08-21 VITALS — BP 134/64 | HR 88 | Temp 97.7°F | Ht 68.0 in | Wt 236.0 lb

## 2016-08-21 DIAGNOSIS — E1021 Type 1 diabetes mellitus with diabetic nephropathy: Secondary | ICD-10-CM

## 2016-08-21 DIAGNOSIS — I10 Essential (primary) hypertension: Secondary | ICD-10-CM | POA: Diagnosis not present

## 2016-08-21 LAB — BAYER DCA HB A1C WAIVED: HB A1C (BAYER DCA - WAIVED): 7.2 % — ABNORMAL HIGH (ref <7.0)

## 2016-08-21 NOTE — Progress Notes (Signed)
Subjective:    Patient ID: [name], male    DOB: [birth year], 82 y.o.   MRN: [id number]  HPI Patient here today for 2 week follow up on BP and Headaches.  At last visit patient was complaining of occipital headache and hypertension. Her losartan was started. His headache and head pain has subsided and his blood pressure is under much better control. He is pleased with results   Patient Active Problem List   Diagnosis Date Noted  . Chronic back pain 07/11/2016  . Vitamin D insufficiency 10/13/2013  . DOE (dyspnea on exertion) 04/22/2013  . Candida esophagitis ([place]) 05/14/2012  . Helicobacter pylori gastritis 05/14/2012  . Screening for colon cancer 04/16/2012  . GERD (gastroesophageal reflux disease) 04/15/2012  . Seizure disorder ([place]) 03/26/2012  . Glaucoma primary, open angle 03/23/2012  . Anxiety 03/23/2012  . Syncope and collapse 02/26/2012  . Diabetes mellitus ([place]) 02/26/2012  . Essential hypertension, benign 02/26/2012  . Hyperlipidemia 02/26/2012   Outpatient Encounter Prescriptions as of 08/21/2016  Medication Sig  . amLODipine (NORVASC) 10 MG tablet TAKE 1 TABLET (10 MG TOTAL) BY MOUTH DAILY.  [name] aspirin 81 MG tablet Take 81 mg by mouth daily. Reported on 03/08/2016  . atorvastatin (LIPITOR) 20 MG tablet TAKE 1 TABLET (20 MG TOTAL) BY MOUTH DAILY AT 6 PM.  . baclofen (LIORESAL) 10 MG tablet TAKE 1 TABLET (10 MG TOTAL) BY MOUTH 3 (THREE) TIMES DAILY.  . diazepam (VALIUM) 5 MG tablet Take 1 tablet (5 mg total) by mouth 2 (two) times daily as needed.  . hydrochlorothiazide (MICROZIDE) 12.5 MG capsule Take 2 capsules (25 mg total) by mouth daily.  . Insulin Glargine (LANTUS SOLOSTAR) 100 UNIT/ML Solostar Pen Inject 66 Units into the skin daily. Use on place of Toujeo or other long acting insulin  . insulin glulisine (APIDRA) 100 UNIT/ML injection Inject 20 Units into the skin daily with breakfast. Reported on 03/08/2016  . irbesartan (AVAPRO) 150 MG tablet Take 1 tablet  (150 mg total) by mouth daily.  [name] latanoprost (XALATAN) 0.005 % ophthalmic solution INSTILL 1 DROP INTO THE RIGHT EYE AT BEDTIME  . meloxicam (MOBIC) 7.5 MG tablet Take 1 tablet (7.5 mg total) by mouth daily.  . metFORMIN (GLUCOPHAGE-XR) 500 MG 24 hr tablet Take 1 tablet (500 mg total) by mouth daily with breakfast.  . ONE TOUCH ULTRA TEST test strip USE FOR TESTING TWICE A DAY  . ONETOUCH DELICA LANCETS 99991111 MISC 1 STICK BY DOES NOT APPLY ROUTE 2 (TWO) TIMES DAILY.  [name] Propylene Glycol (SYSTANE BALANCE) 0.6 % SOLN Apply 2 drops to eye daily as needed. Reported on 03/08/2016  . terazosin (HYTRIN) 5 MG capsule   . timolol (TIMOPTIC) 0.5 % ophthalmic solution INSTILL 1 DROP INTO RIGHT EYE EVERY MORING  . ULTICARE MICRO PEN NEEDLES 32G X 4 MM MISC USE AS DIRECTED BY MD  . Vitamin D, Ergocalciferol, (DRISDOL) 50000 units CAPS capsule TAKE ONE CAPSULE ONCE A WEEK  . Insulin Degludec (TRESIBA FLEXTOUCH) 100 UNIT/ML SOPN Inject 66 Units into the skin daily. (no samples of Toujeo) (Patient not taking: Reported on 08/21/2016)  . [DISCONTINUED] terazosin (HYTRIN) 5 MG capsule TAKE 1 CAPSULE BY MOUTH ONCE DAILY  . [DISCONTINUED] terazosin (HYTRIN) 5 MG capsule TAKE 1 CAPSULE BY MOUTH ONCE DAILY   No facility-administered encounter medications on file as of 08/21/2016.       Review of Systems  Constitutional: Negative.   HENT: Negative.   Eyes: Negative.   Respiratory: Negative.  Cardiovascular: Negative.   Gastrointestinal: Negative.   Endocrine: Negative.   Genitourinary: Negative.   Musculoskeletal: Negative.   Skin: Negative.   Allergic/Immunologic: Negative.   Neurological: Negative.  Headaches: better.  Hematological: Negative.   Psychiatric/Behavioral: Negative.        Objective:   Physical Exam  Constitutional: He is oriented to person, place, and time. He appears well-developed and well-nourished.  Eyes: Pupils are equal, round, and reactive to light.  Cardiovascular: Normal rate,  regular rhythm and normal heart sounds.   Pulmonary/Chest: Effort normal and breath sounds normal.  Neurological: He is alert and oriented to person, place, and time.  Psychiatric: He has a normal mood and affect. His behavior is normal.   BP 134/64 (BP Location: Left Arm)   Pulse 88   Temp 97.7 F (36.5 C) (Oral)   Ht 5\' 8"  (1.727 m)   Wt 236 lb (107 kg)   BMI 35.88 kg/m         Assessment & Plan:  1. Essential hypertension, benign Blood pressure is better controlled on irbesartan 150 mg. Continue same  2. 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 That was 5 months ago - [facility] Hb A1c F[place] Wardell Honour MD

## 2016-08-21 NOTE — Telephone Encounter (Signed)
Pt aware by detailed VM 

## 2016-08-22 ENCOUNTER — Telehealth: Payer: Self-pay | Admitting: Family

## 2016-08-22 MED ORDER — BASAGLAR KWIKPEN 100 UNIT/ML ~~LOC~~ SOPN: 66.0000 [IU] | PEN_INJECTOR | Freq: Every day | SUBCUTANEOUS | 0 refills | Status: DC

## 2016-08-22 NOTE — Telephone Encounter (Signed)
Left #1 Basaglar sample  - patient to use inplace of usual long acting insulin - inject 66 units once daily. Patient aware

## 2016-08-28 ENCOUNTER — Telehealth: Payer: Self-pay | Admitting: Family

## 2016-08-29 MED ORDER — INSULIN GLARGINE 300 UNIT/ML ~~LOC~~ SOPN: 66.0000 [IU] | PEN_INJECTOR | Freq: Every day | SUBCUTANEOUS | 0 refills | Status: DC

## 2016-08-29 NOTE — Telephone Encounter (Signed)
LEFT SAMPLE PEN #1 OF TOUJEO FOR PATIENT - LEFT MESSAGE TO NOTIFY PATIENT THAT READY FOR PICK UP

## 2016-09-09 ENCOUNTER — Telehealth: Payer: Self-pay | Admitting: Family

## 2016-09-09 MED ORDER — INSULIN DEGLUDEC 100 UNIT/ML ~~LOC~~ SOPN: 66.0000 [IU] | PEN_INJECTOR | Freq: Every day | SUBCUTANEOUS | 0 refills | Status: DC

## 2016-09-09 NOTE — Telephone Encounter (Signed)
#  2 pens of Tresiba left for patient to pick up.  LM for patient on VM

## 2016-09-16 ENCOUNTER — Other Ambulatory Visit: Payer: Self-pay | Admitting: Family

## 2016-09-16 ENCOUNTER — Telehealth: Payer: Self-pay | Admitting: Family

## 2016-09-17 ENCOUNTER — Telehealth: Payer: Self-pay

## 2016-09-17 ENCOUNTER — Ambulatory Visit (INDEPENDENT_AMBULATORY_CARE_PROVIDER_SITE_OTHER): Payer: Medicare Other

## 2016-09-17 DIAGNOSIS — Z23 Encounter for immunization: Secondary | ICD-10-CM | POA: Diagnosis not present

## 2016-09-17 MED ORDER — INSULIN GLULISINE 100 UNIT/ML SOLOSTAR PEN: 20.0000 [IU] | PEN_INJECTOR | Freq: Every day | SUBCUTANEOUS | 11 refills | Status: DC

## 2016-09-17 MED ORDER — INSULIN GLARGINE 100 UNIT/ML SOLOSTAR PEN: 66.0000 [IU] | PEN_INJECTOR | Freq: Every day | SUBCUTANEOUS | 0 refills | Status: DC

## 2016-09-17 NOTE — Telephone Encounter (Signed)
Patient is requesting samples of flex touch or toujeo and also a refill of his [facility]idra to [facility], please advise and route to pools for completion.

## 2016-09-17 NOTE — Telephone Encounter (Signed)
See other note

## 2016-09-17 NOTE — Telephone Encounter (Signed)
Rx for apridra sent in and sample of lantus left for patient -  Lm for patient no VM

## 2016-09-19 ENCOUNTER — Other Ambulatory Visit: Payer: Self-pay | Admitting: Family

## 2016-09-19 MED ORDER — INSULIN LISPRO 100 UNIT/ML ~~LOC~~ SOLN: 20.0000 [IU] | Freq: Every day | SUBCUTANEOUS | 1 refills | Status: DC

## 2016-09-19 NOTE — Telephone Encounter (Signed)
Patient aware.

## 2016-09-19 NOTE — Telephone Encounter (Signed)
Received note from his pharmacy that Humalog is preferred - [facility].

## 2016-09-20 ENCOUNTER — Other Ambulatory Visit: Payer: Self-pay | Admitting: Family

## 2016-09-20 ENCOUNTER — Telehealth: Payer: Self-pay | Admitting: Pharmacist

## 2016-09-20 NOTE — Telephone Encounter (Signed)
Already approved this.

## 2016-09-20 NOTE — Telephone Encounter (Signed)
Medication called to pharmacy. 

## 2016-09-23 ENCOUNTER — Telehealth: Payer: Self-pay | Admitting: Family

## 2016-09-23 MED ORDER — INSULIN ASPART 100 UNIT/ML FLEXPEN: 20.0000 [IU] | PEN_INJECTOR | Freq: Every day | SUBCUTANEOUS | 0 refills | Status: DC

## 2016-09-23 NOTE — Telephone Encounter (Signed)
I called [name] but has to leave message asked let me know what she needs in order to get [name] some short acting insulin.   Humalog was $150.  Apidra was not covered.  I have left some Novolog for patient to use until we can get short acting insulin from [facility] PAP.

## 2016-09-23 NOTE — Telephone Encounter (Signed)
Duplicate request

## 2016-10-01 ENCOUNTER — Telehealth: Payer: Self-pay | Admitting: Family

## 2016-10-02 MED ORDER — INSULIN DEGLUDEC 100 UNIT/ML ~~LOC~~ SOPN: 66.0000 [IU] | PEN_INJECTOR | Freq: Every day | SUBCUTANEOUS | 0 refills | Status: DC

## 2016-10-02 NOTE — Telephone Encounter (Signed)
#  2 samples of Tresiba left for patient to pick up

## 2016-10-09 ENCOUNTER — Telehealth: Payer: Self-pay | Admitting: Family

## 2016-10-09 MED ORDER — INSULIN DEGLUDEC 100 UNIT/ML ~~LOC~~ SOPN: 66.0000 [IU] | PEN_INJECTOR | Freq: Every day | SUBCUTANEOUS | 0 refills | Status: DC

## 2016-10-09 NOTE — Telephone Encounter (Signed)
#  1 sample of Tresiba left for patient.

## 2016-10-16 ENCOUNTER — Telehealth: Payer: Self-pay | Admitting: Pharmacist

## 2016-10-16 NOTE — Telephone Encounter (Signed)
Left message, no samples.

## 2016-10-16 NOTE — Telephone Encounter (Signed)
Office is completely out of all insulin samples. Please advise patient.

## 2016-10-17 NOTE — Telephone Encounter (Signed)
Received Tresiba samples late on 10/16/16.  #2 samples left for patient to pick up. LM for patient

## 2016-10-24 ENCOUNTER — Telehealth: Payer: Self-pay | Admitting: Family

## 2016-10-24 NOTE — Telephone Encounter (Signed)
#  2 Toujeo samples left for patient

## 2016-11-04 ENCOUNTER — Telehealth: Payer: Self-pay | Admitting: Family

## 2016-11-05 ENCOUNTER — Telehealth: Payer: Self-pay | Admitting: Family

## 2016-11-05 ENCOUNTER — Telehealth: Payer: Self-pay | Admitting: Pharmacist

## 2016-11-05 NOTE — Telephone Encounter (Signed)
Toujeo #2 samples left and #1 Humalog left for patient.  Patient aware. I called patient - he has contacted [employer] and they are going to help get 1 month of short acting insulin.

## 2016-11-05 NOTE — Telephone Encounter (Signed)
Pharmacy called to confirm short acting insulin dose.  [employer] is going to pay for 1 Rx to help patient out.

## 2016-11-05 NOTE — Telephone Encounter (Signed)
Questions answered for patient about insulin

## 2016-11-13 ENCOUNTER — Telehealth: Payer: Self-pay | Admitting: Family

## 2016-11-13 MED ORDER — INSULIN DEGLUDEC 100 UNIT/ML ~~LOC~~ SOPN: 66.0000 [IU] | PEN_INJECTOR | Freq: Every day | SUBCUTANEOUS | 0 refills | Status: DC

## 2016-11-13 NOTE — Telephone Encounter (Signed)
#  2 samples of Tresiba left for patient

## 2016-11-26 ENCOUNTER — Telehealth: Payer: Self-pay | Admitting: Family

## 2016-11-26 NOTE — Telephone Encounter (Signed)
#  2 pens of Tresiba left for patient - instructed to inject 66 units daily.   Left message with information to patient.

## 2016-12-04 ENCOUNTER — Telehealth: Payer: Self-pay | Admitting: Family

## 2016-12-04 MED ORDER — INSULIN GLARGINE 300 UNIT/ML ~~LOC~~ SOPN: 66.0000 [IU] | PEN_INJECTOR | Freq: Every day | SUBCUTANEOUS | 0 refills | Status: DC

## 2016-12-04 NOTE — Telephone Encounter (Signed)
#  2 pens toujeo left for patient to pick up

## 2016-12-05 ENCOUNTER — Other Ambulatory Visit: Payer: Self-pay | Admitting: Family Medicine

## 2016-12-05 ENCOUNTER — Other Ambulatory Visit: Payer: Self-pay | Admitting: Family

## 2016-12-16 ENCOUNTER — Telehealth: Payer: Self-pay | Admitting: Family

## 2016-12-16 NOTE — Telephone Encounter (Signed)
No toujeo but left #2 Basaglar - same directions inject 66 units once daily. Patient aware

## 2016-12-20 ENCOUNTER — Ambulatory Visit: Payer: Medicare Other | Admitting: Family Medicine

## 2016-12-25 ENCOUNTER — Telehealth: Payer: Self-pay | Admitting: Pharmacist

## 2016-12-25 NOTE — Telephone Encounter (Signed)
Left #2 samples of Tresiba for patient

## 2017-01-03 ENCOUNTER — Other Ambulatory Visit: Payer: Self-pay | Admitting: Family Medicine

## 2017-01-06 ENCOUNTER — Telehealth: Payer: Self-pay | Admitting: Family

## 2017-01-06 MED ORDER — INSULIN GLARGINE 300 UNIT/ML ~~LOC~~ SOPN: 66.0000 [IU] | PEN_INJECTOR | Freq: Every day | SUBCUTANEOUS | 1 refills | Status: DC

## 2017-01-06 NOTE — Telephone Encounter (Signed)
Left #2 pens of Toujeo for patient.  I also sent in Rx for Toujeo for patient to get - with start of new year price might be more affordable.

## 2017-01-10 ENCOUNTER — Other Ambulatory Visit: Payer: Self-pay | Admitting: Pharmacist

## 2017-01-10 MED ORDER — INSULIN GLARGINE 300 UNIT/ML ~~LOC~~ SOPN: 66.0000 [IU] | PEN_INJECTOR | Freq: Every day | SUBCUTANEOUS | 1 refills | Status: DC

## 2017-02-03 ENCOUNTER — Telehealth: Payer: Self-pay | Admitting: Pharmacist

## 2017-02-04 MED ORDER — INSULIN GLARGINE 300 UNIT/ML ~~LOC~~ SOPN: 66.0000 [IU] | PEN_INJECTOR | Freq: Every day | SUBCUTANEOUS | 0 refills | Status: DC

## 2017-02-04 NOTE — Telephone Encounter (Signed)
Patient given #2 samples

## 2017-02-12 ENCOUNTER — Other Ambulatory Visit: Payer: Self-pay | Admitting: Family Medicine

## 2017-02-12 ENCOUNTER — Other Ambulatory Visit: Payer: Self-pay | Admitting: Family

## 2017-02-17 ENCOUNTER — Telehealth: Payer: Self-pay | Admitting: Family

## 2017-02-17 MED ORDER — BASAGLAR KWIKPEN 100 UNIT/ML ~~LOC~~ SOPN: 66.0000 [IU] | PEN_INJECTOR | Freq: Every day | SUBCUTANEOUS | 0 refills | Status: DC

## 2017-02-17 NOTE — Telephone Encounter (Signed)
Duplicate message - also message from this am

## 2017-02-17 NOTE — Telephone Encounter (Signed)
#  2 pens of Basaglar left for patient and also reminded him for follow up appt 02/24/2017

## 2017-02-24 ENCOUNTER — Other Ambulatory Visit: Payer: Self-pay | Admitting: *Deleted

## 2017-02-24 ENCOUNTER — Ambulatory Visit (INDEPENDENT_AMBULATORY_CARE_PROVIDER_SITE_OTHER): Payer: Medicare Other | Admitting: Family

## 2017-02-24 ENCOUNTER — Encounter: Payer: Self-pay | Admitting: Family

## 2017-02-24 ENCOUNTER — Telehealth: Payer: Self-pay | Admitting: Family

## 2017-02-24 VITALS — BP 161/72 | HR 87 | Temp 97.2°F | Ht 68.0 in | Wt 247.0 lb

## 2017-02-24 DIAGNOSIS — G40909 Epilepsy, unspecified, not intractable, without status epilepticus: Secondary | ICD-10-CM | POA: Diagnosis not present

## 2017-02-24 DIAGNOSIS — F419 Anxiety disorder, unspecified: Secondary | ICD-10-CM

## 2017-02-24 DIAGNOSIS — I10 Essential (primary) hypertension: Secondary | ICD-10-CM

## 2017-02-24 DIAGNOSIS — R609 Edema, unspecified: Secondary | ICD-10-CM

## 2017-02-24 DIAGNOSIS — E559 Vitamin D deficiency, unspecified: Secondary | ICD-10-CM

## 2017-02-24 DIAGNOSIS — K219 Gastro-esophageal reflux disease without esophagitis: Secondary | ICD-10-CM

## 2017-02-24 DIAGNOSIS — E785 Hyperlipidemia, unspecified: Secondary | ICD-10-CM | POA: Diagnosis not present

## 2017-02-24 DIAGNOSIS — M545 Low back pain: Secondary | ICD-10-CM

## 2017-02-24 DIAGNOSIS — G8929 Other chronic pain: Secondary | ICD-10-CM

## 2017-02-24 DIAGNOSIS — E1021 Type 1 diabetes mellitus with diabetic nephropathy: Secondary | ICD-10-CM | POA: Diagnosis not present

## 2017-02-24 LAB — BAYER DCA HB A1C WAIVED: HB A1C: 7.4 % — AB (ref <7.0)

## 2017-02-24 MED ORDER — FUROSEMIDE 20 MG PO TABS: 20.0000 mg | ORAL_TABLET | Freq: Every day | ORAL | 1 refills | Status: DC

## 2017-02-24 NOTE — Patient Instructions (Signed)

## 2017-02-24 NOTE — Telephone Encounter (Signed)
[facility] Pt and [place] notified notified
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
## 2017-02-25 ENCOUNTER — Other Ambulatory Visit: Payer: Self-pay | Admitting: Family

## 2017-02-25 LAB — CMP14+EGFR
ALK PHOS: 77 IU/L (ref 39–117)
ALT: 52 IU/L — AB (ref 0–44)
AST: 49 IU/L — AB (ref 0–40)
Albumin/Globulin Ratio: 1.8 (ref 1.2–2.2)
Albumin: 5.1 g/dL — ABNORMAL HIGH (ref 3.5–4.7)
BUN/Creatinine Ratio: 12 (ref 10–24)
BUN: 10 mg/dL (ref 8–27)
Bilirubin Total: 0.4 mg/dL (ref 0.0–1.2)
CALCIUM: 10.2 mg/dL (ref 8.6–10.2)
CO2: 24 mmol/L (ref 18–29)
CREATININE: 0.85 mg/dL (ref 0.76–1.27)
Chloride: 99 mmol/L (ref 96–106)
GFR calc Af Amer: 94 mL/min/{1.73_m2} (ref >59)
GFR, EST NON AFRICAN AMERICAN: 82 mL/min/{1.73_m2} (ref >59)
Globulin, Total: 2.9 g/dL (ref 1.5–4.5)
Glucose: 95 mg/dL (ref 65–99)
POTASSIUM: 4.3 mmol/L (ref 3.5–5.2)
Sodium: 141 mmol/L (ref 134–144)
Total Protein: 8 g/dL (ref 6.0–8.5)

## 2017-02-25 LAB — LIPID PANEL
CHOLESTEROL TOTAL: 123 mg/dL (ref 100–199)
Chol/HDL Ratio: 3 ratio units (ref 0.0–5.0)
HDL: 41 mg/dL (ref >39)
LDL CALC: 58 mg/dL (ref 0–99)
Triglycerides: 120 mg/dL (ref 0–149)
VLDL Cholesterol Cal: 24 mg/dL (ref 5–40)

## 2017-02-25 LAB — MICROALBUMIN / CREATININE URINE RATIO
Creatinine, Urine: 35.6 mg/dL
MICROALB/CREAT RATIO: 145.8 mg/g{creat} — AB (ref 0.0–30.0)
Microalbumin, Urine: 51.9 ug/mL

## 2017-02-26 ENCOUNTER — Telehealth: Payer: Self-pay | Admitting: Family

## 2017-02-27 NOTE — Telephone Encounter (Signed)
Only has #1 samples for [name] [id number] Patient notified that sometimes might need less [place] than Toujeo.  He is advised to decrease dose to 60 unit daily and monitor BG 2-3 times daily. Call office for instruction if he has hypoglycemia.

## 2017-03-05 ENCOUNTER — Other Ambulatory Visit: Payer: Self-pay | Admitting: Family Medicine

## 2017-03-09 ENCOUNTER — Other Ambulatory Visit: Payer: Self-pay | Admitting: Family Medicine

## 2017-03-10 ENCOUNTER — Telehealth: Payer: Self-pay | Admitting: Family

## 2017-03-11 MED ORDER — TOUJEO SOLOSTAR 300 UNIT/ML ~~LOC~~ SOPN: 66.0000 [IU] | PEN_INJECTOR | Freq: Every day | SUBCUTANEOUS | 0 refills | Status: DC

## 2017-03-11 NOTE — Telephone Encounter (Signed)
#  2 pens of Toujeo insulin left for patient.

## 2017-03-12 ENCOUNTER — Other Ambulatory Visit: Payer: Self-pay | Admitting: Family

## 2017-03-13 ENCOUNTER — Telehealth: Payer: Self-pay | Admitting: Family

## 2017-03-13 NOTE — Telephone Encounter (Signed)
lmtcb

## 2017-03-20 NOTE — Telephone Encounter (Signed)
Left message for patient to call on 03-15 with no response.

## 2017-03-24 ENCOUNTER — Telehealth: Payer: Self-pay | Admitting: Family

## 2017-03-24 NOTE — Telephone Encounter (Signed)
#  2 samples for Toujeo left for patient - patient notified.

## 2017-04-04 ENCOUNTER — Telehealth: Payer: Self-pay | Admitting: Family

## 2017-04-07 ENCOUNTER — Telehealth: Payer: Self-pay | Admitting: Family

## 2017-04-07 NOTE — Telephone Encounter (Signed)
Patient aware that [place] samples are ready for pick up

## 2017-04-07 NOTE — Telephone Encounter (Signed)
It appears that 2 Touejo samples was left for patient already.  Patient notified.

## 2017-04-10 ENCOUNTER — Other Ambulatory Visit: Payer: Self-pay | Admitting: Family

## 2017-04-10 DIAGNOSIS — G8929 Other chronic pain: Secondary | ICD-10-CM

## 2017-04-10 DIAGNOSIS — M549 Dorsalgia, unspecified: Principal | ICD-10-CM

## 2017-04-10 NOTE — Telephone Encounter (Signed)
Last filled 02/12/17, last seen 02/24/17. Route to pool for call in

## 2017-04-11 NOTE — Telephone Encounter (Signed)
Rx called in 

## 2017-04-14 ENCOUNTER — Ambulatory Visit: Payer: Medicare Other | Admitting: Pharmacist

## 2017-04-17 ENCOUNTER — Telehealth: Payer: Self-pay | Admitting: Family

## 2017-04-17 NOTE — Telephone Encounter (Signed)
#  3 samples of Toujeo left for patient to pick up.  Continue to inject 66 units once daily

## 2017-05-01 ENCOUNTER — Ambulatory Visit (INDEPENDENT_AMBULATORY_CARE_PROVIDER_SITE_OTHER): Payer: Medicare Other | Admitting: Pharmacist

## 2017-05-01 VITALS — BP 142/68 | HR 78 | Ht 68.0 in | Wt 242.5 lb

## 2017-05-01 DIAGNOSIS — R609 Edema, unspecified: Secondary | ICD-10-CM | POA: Diagnosis not present

## 2017-05-01 DIAGNOSIS — I10 Essential (primary) hypertension: Secondary | ICD-10-CM | POA: Diagnosis not present

## 2017-05-01 DIAGNOSIS — R600 Localized edema: Secondary | ICD-10-CM

## 2017-05-01 DIAGNOSIS — Z79899 Other long term (current) drug therapy: Secondary | ICD-10-CM | POA: Diagnosis not present

## 2017-05-01 MED ORDER — FUROSEMIDE 20 MG PO TABS: 20.0000 mg | ORAL_TABLET | Freq: Every day | ORAL | 0 refills | Status: DC

## 2017-05-01 NOTE — Progress Notes (Signed)
Patient ID: [name], male   DOB: February 08, 1933, 82 y.o.   MRN: [id number]   Subjective:    [name] is a 83 y.o. male who presents for an medication management and edema.  I have seen [name] about twice yearly for several years.  Today he would like to review his medications and have me to make a mediation guide for him like in the past because it has been about 15 months since it was last updated and he has had some medication changes.  He also states that he continues to have LEE bilaterally even after change from HCTZ 57m qd (2 caps of 12.571m to furosemide 2074md.  Meds were changed 01/2017.   Weight is stable.  Denied SOB or dizziness   Current Outpatient Prescriptions:  .  amLODipine (NORVASC) 10 MG tablet, TAKE 1 TABLET (10 MG TOTAL) BY MOUTH DAILY., Disp: 90 tablet, Rfl: 0 .  aspirin 81 MG tablet, Take 81 mg by mouth daily. Reported on 03/08/2016, Disp: , Rfl:  .  atorvastatin (LIPITOR) 20 MG tablet, TAKE 1 TABLET (20 MG TOTAL) BY MOUTH DAILY AT 6 PM., Disp: 90 tablet, Rfl: 0 .  furosemide (LASIX) 20 MG tablet, Take 1 tablet (20 mg total) by mouth daily., Disp: 180 tablet, Rfl: 0 .  insulin aspart (NOVOLOG FLEXPEN) 100 UNIT/ML FlexPen, Inject 20 Units into the skin daily with breakfast., Disp: 3 mL, Rfl: 0 .  irbesartan (AVAPRO) 150 MG tablet, TAKE 1 TABLET (150 MG TOTAL) BY MOUTH DAILY., Disp: 30 tablet, Rfl: 5 .  latanoprost (XALATAN) 0.005 % ophthalmic solution, INSTILL 1 DROP INTO THE RIGHT EYE AT BEDTIME, Disp: 7.5 mL, Rfl: 2 .  metFORMIN (GLUCOPHAGE-XR) 500 MG 24 hr tablet, Take 1 tablet (500 mg total) by mouth daily with breakfast. (Patient taking differently: Take 500 mg by mouth daily with breakfast. Take 1/2 tablet each morning with breakfast and 1 tablet in evening with supper), Disp: 90 tablet, Rfl: 0 .  ONE TOUCH ULTRA TEST test strip, USE FOR TESTING TWICE A DAY, Disp: 200 each, Rfl: 2 .  ONETOUCH DELICA LANCETS [id number], 1 STICK BY DOES NOT APPLY ROUTE 2 (TWO)  TIMES DAILY., Disp: 200 each, Rfl: 1 .  Propylene Glycol (SYSTANE BALANCE) 0.6 % SOLN, Apply 2 drops to eye daily as needed. Reported on 03/08/2016, Disp: , Rfl:  .  terazosin (HYTRIN) 5 MG capsule, , Disp: , Rfl:  .  timolol (TIMOPTIC) 0.5 % ophthalmic solution, INSTILL 1 DROP INTO RIGHT EYE EVERY MORING, Disp: , Rfl: 3 .  TOUJEO SOLOSTAR 300 UNIT/ML SOPN, Inject 66 Units into the skin daily., Disp: 3 mL, Rfl: 0 .  ULTICARE MICRO PEN NEEDLES 32G X 4 MM MISC, USE AS DIRECTED BY MD, Disp: 100 each, Rfl: 2 .  Vitamin D, Ergocalciferol, (DRISDOL) 50000 units CAPS capsule, TAKE ONE CAPSULE ONCE A WEEK, Disp: 12 capsule, Rfl: 0 .  baclofen (LIORESAL) 10 MG tablet, TAKE 1 TABLET THREE TIMES A DAY (Patient taking differently: TAKE 1 TABLET THREE TIMES A DAY as needed for spasms), Disp: 60 tablet, Rfl: 2 .  diazepam (VALIUM) 5 MG tablet, TAKE ONE TABLET BY MOUTH TWICE DAILY AS NEEDED (Patient taking differently: TAKE ONE TABLET BY MOUTH TWICE DAILY AS NEEDED for nerves / anxiety), Disp: 60 tablet, Rfl: 1 .  meloxicam (MOBIC) 7.5 MG tablet, TAKE 1 TABLET (7.5 MG TOTAL) BY MOUTH DAILY. (Patient taking differently: Take 7.5 mg by mouth daily. ), Disp: 30 tablet, Rfl: 5  Objective:    BP (!) 142/68   Pulse 78   Ht _0  (1.727 m)   Wt 242 lb 8 oz (110 kg)   BMI 36.87 kg/m    1+ edema noted bilaterally  Lab Review Glucose (mg/dL)  Date Value  02/24/2017 95  12/04/2015 141 (H)  12/02/2014 103 (H)   BUN (mg/dL)  Date Value  02/24/2017 10  12/04/2015 8  12/02/2014 11   Creatinine, Ser (mg/dL)  Date Value  02/24/2017 0.85  12/04/2015 0.73 (L)  12/02/2014 0.94      Assessment:   Medication management LEE bilaterally   Plan:    1.  Increase furosemide 28m to 1-2 tablets qam for swelling 2.  Made medication information sheet for patient with dosing times and indications for each medication. 3.  Orders Placed This Encounter  Procedures  . BMP8+EGFR

## 2017-05-02 LAB — BMP8+EGFR
BUN/Creatinine Ratio: 14 (ref 10–24)
BUN: 12 mg/dL (ref 8–27)
CO2: 22 mmol/L (ref 18–29)
CREATININE: 0.85 mg/dL (ref 0.76–1.27)
Calcium: 10.4 mg/dL — ABNORMAL HIGH (ref 8.6–10.2)
Chloride: 100 mmol/L (ref 96–106)
GFR calc Af Amer: 94 mL/min/{1.73_m2} (ref >59)
GFR calc non Af Amer: 82 mL/min/{1.73_m2} (ref >59)
GLUCOSE: 88 mg/dL (ref 65–99)
Potassium: 4.5 mmol/L (ref 3.5–5.2)
Sodium: 141 mmol/L (ref 134–144)

## 2017-05-05 ENCOUNTER — Telehealth: Payer: Self-pay | Admitting: Family

## 2017-05-05 ENCOUNTER — Other Ambulatory Visit: Payer: Self-pay | Admitting: Family

## 2017-05-06 MED ORDER — TOUJEO SOLOSTAR 300 UNIT/ML ~~LOC~~ SOPN: 66.0000 [IU] | PEN_INJECTOR | Freq: Every day | SUBCUTANEOUS | 0 refills | Status: DC

## 2017-05-06 NOTE — Telephone Encounter (Signed)
Left message for patient - #2 samples left for him of Toujeo insulin

## 2017-05-07 ENCOUNTER — Other Ambulatory Visit: Payer: Self-pay | Admitting: Family

## 2017-05-11 ENCOUNTER — Other Ambulatory Visit: Payer: Self-pay | Admitting: Family

## 2017-05-19 ENCOUNTER — Telehealth: Payer: Self-pay | Admitting: Family

## 2017-05-19 NOTE — Telephone Encounter (Signed)
Left #1 Tresiba sample for patient.

## 2017-05-27 ENCOUNTER — Telehealth: Payer: Self-pay | Admitting: Pharmacist

## 2017-05-27 MED ORDER — BASAGLAR KWIKPEN 100 UNIT/ML ~~LOC~~ SOPN: 66.0000 [IU] | PEN_INJECTOR | Freq: Every day | SUBCUTANEOUS | 0 refills | Status: DC

## 2017-05-27 NOTE — Telephone Encounter (Signed)
No Toujeo sample available but we did have Basaglar - left #1 pen of Basaglar - patient is to continus same instructions - inject 66 units once daily.

## 2017-06-02 ENCOUNTER — Telehealth: Payer: Self-pay | Admitting: Family

## 2017-06-02 MED ORDER — INSULIN GLARGINE 100 UNIT/ML SOLOSTAR PEN: 60.0000 [IU] | PEN_INJECTOR | Freq: Every day | SUBCUTANEOUS | 0 refills | Status: DC

## 2017-06-02 NOTE — Telephone Encounter (Signed)
Left #2 Lantus pens for patient to pick up

## 2017-06-08 ENCOUNTER — Other Ambulatory Visit: Payer: Self-pay | Admitting: Pharmacist

## 2017-06-10 ENCOUNTER — Telehealth: Payer: Self-pay | Admitting: Family

## 2017-06-10 NOTE — Telephone Encounter (Signed)
Aware, per message, no samples available. 

## 2017-06-11 ENCOUNTER — Other Ambulatory Visit: Payer: Self-pay | Admitting: Family

## 2017-06-12 ENCOUNTER — Telehealth: Payer: Self-pay | Admitting: Family

## 2017-06-12 MED ORDER — METFORMIN HCL ER 500 MG PO TB24: 1000.0000 mg | ORAL_TABLET | Freq: Every day | ORAL | 0 refills | Status: DC

## 2017-06-12 MED ORDER — BASAGLAR KWIKPEN 100 UNIT/ML ~~LOC~~ SOPN: 66.0000 [IU] | PEN_INJECTOR | Freq: Every day | SUBCUTANEOUS | 0 refills | Status: DC

## 2017-06-12 NOTE — Telephone Encounter (Signed)
Recommend continue metformin XR 500mg  2 tablets daily as last A1c was WNL. Rx sent to pharmacy

## 2017-06-12 NOTE — Telephone Encounter (Signed)
Left message, [facility]

## 2017-06-12 NOTE — Telephone Encounter (Signed)
Only has Basaglar  #1 sample.  Left for patient.

## 2017-06-13 ENCOUNTER — Other Ambulatory Visit: Payer: Self-pay | Admitting: Family

## 2017-06-13 MED ORDER — INSULIN PEN NEEDLE 32G X 4 MM MISC: 2 refills | Status: DC

## 2017-06-13 NOTE — Telephone Encounter (Signed)
[facility] notified

## 2017-06-16 ENCOUNTER — Telehealth: Payer: Self-pay | Admitting: Family

## 2017-06-17 ENCOUNTER — Telehealth: Payer: Self-pay | Admitting: Family

## 2017-06-17 NOTE — Telephone Encounter (Signed)
Pt notified of recommendation 

## 2017-06-17 NOTE — Telephone Encounter (Signed)
No long acting insulin samples available.  We can send in Rx if patient in out of insulin. If too expensive then recommend change to NPH insulin - can get 1 vial at [facility] for $25.

## 2017-06-17 NOTE — Telephone Encounter (Signed)
Pt notified of Tammy's recommednation Declined RX at this time

## 2017-06-18 ENCOUNTER — Telehealth: Payer: Self-pay | Admitting: Family

## 2017-06-18 MED ORDER — BASAGLAR KWIKPEN 100 UNIT/ML ~~LOC~~ SOPN: 66.0000 [IU] | PEN_INJECTOR | Freq: Every day | SUBCUTANEOUS | 0 refills | Status: DC

## 2017-06-18 NOTE — Telephone Encounter (Signed)
lmtcb

## 2017-06-18 NOTE — Telephone Encounter (Signed)
The Relion NPH insulin does not require PA or even a prescription.  Patient can get OTC.  Patient was called about insulin - states that he does not have money to but Relion NPH.  We received some Basaglar pens today - left #1 pen for patient to pick up but I did discuss with him that relying on samples is not best resource for insulin.  I would like to find a more acceptable solution for insulin supply.

## 2017-07-09 ENCOUNTER — Other Ambulatory Visit: Payer: Self-pay | Admitting: Family

## 2017-07-09 ENCOUNTER — Ambulatory Visit: Payer: Self-pay | Admitting: Pharmacist

## 2017-07-14 ENCOUNTER — Telehealth: Payer: Self-pay | Admitting: Family

## 2017-07-14 MED ORDER — TOUJEO SOLOSTAR 300 UNIT/ML ~~LOC~~ SOPN: 66.0000 [IU] | PEN_INJECTOR | Freq: Every day | SUBCUTANEOUS | 0 refills | Status: DC

## 2017-07-14 NOTE — Telephone Encounter (Signed)
I had recommended patient use Relion NPH.  This can only be purchased at [facility] and does not required a prescription.  It costs $25 and patient would need to use syringes as Relion NPH is only available in a pen.  I did have #2 pens of Toujeo samples for patient.  Patient was notified by phone of above.

## 2017-07-29 ENCOUNTER — Other Ambulatory Visit: Payer: Self-pay | Admitting: Family

## 2017-07-29 NOTE — Telephone Encounter (Signed)
Last vit D level was 2016

## 2017-08-01 ENCOUNTER — Other Ambulatory Visit: Payer: Self-pay | Admitting: Pharmacist

## 2017-08-01 DIAGNOSIS — I10 Essential (primary) hypertension: Secondary | ICD-10-CM

## 2017-08-01 DIAGNOSIS — R609 Edema, unspecified: Secondary | ICD-10-CM

## 2017-08-04 ENCOUNTER — Encounter: Payer: Self-pay | Admitting: Pharmacist

## 2017-08-04 ENCOUNTER — Ambulatory Visit (INDEPENDENT_AMBULATORY_CARE_PROVIDER_SITE_OTHER): Payer: Medicare Other | Admitting: Pharmacist

## 2017-08-04 ENCOUNTER — Telehealth: Payer: Self-pay | Admitting: Pharmacist

## 2017-08-04 VITALS — BP 152/72 | HR 87 | Ht 68.0 in | Wt 241.0 lb

## 2017-08-04 DIAGNOSIS — Z7689 Persons encountering health services in other specified circumstances: Secondary | ICD-10-CM | POA: Diagnosis not present

## 2017-08-04 DIAGNOSIS — Z0000 Encounter for general adult medical examination without abnormal findings: Secondary | ICD-10-CM | POA: Diagnosis not present

## 2017-08-04 DIAGNOSIS — E119 Type 2 diabetes mellitus without complications: Secondary | ICD-10-CM | POA: Diagnosis not present

## 2017-08-04 LAB — BAYER DCA HB A1C WAIVED: HB A1C (BAYER DCA - WAIVED): 8 % — ABNORMAL HIGH (ref <7.0)

## 2017-08-04 LAB — BMP8+EGFR
BUN / CREAT RATIO: 12 (ref 10–24)
BUN: 11 mg/dL (ref 8–27)
CO2: 18 mmol/L — ABNORMAL LOW (ref 20–29)
CREATININE: 0.92 mg/dL (ref 0.76–1.27)
Calcium: 10.3 mg/dL — ABNORMAL HIGH (ref 8.6–10.2)
Chloride: 102 mmol/L (ref 96–106)
GFR calc Af Amer: 89 mL/min/{1.73_m2} (ref >59)
GFR calc non Af Amer: 77 mL/min/{1.73_m2} (ref >59)
Glucose: 187 mg/dL — ABNORMAL HIGH (ref 65–99)
Potassium: 4.4 mmol/L (ref 3.5–5.2)
Sodium: 140 mmol/L (ref 134–144)

## 2017-08-04 MED ORDER — GABAPENTIN 100 MG PO CAPS: ORAL_CAPSULE | ORAL | 1 refills | Status: DC

## 2017-08-04 NOTE — Patient Instructions (Addendum)
  [name] , Thank you for taking time to come for your Medicare Wellness Visit. I appreciate your ongoing commitment to your health goals. Please review the following plan we discussed and let me know if I can assist you in the future.   These are the goals we discussed:  Increase non-starchy vegetables - carrots, green bean, squash, zucchini, tomatoes, onions, peppers, spinach and other green leafy vegetables, cabbage, lettuce, cucumbers, asparagus, okra (not fried), eggplant Limit sugar and processed foods (cakes, cookies, ice cream, crackers and chips) Increase fresh fruit but limit serving sizes 1/2 cup or about the size of tennis or baseball Limit red meat to no more than 1-2 times per week (serving size about the size of your palm) Choose whole grains / lean proteins - whole wheat bread, quinoa, whole grain rice (1/2 cup), fish, chicken, [place] Avoid sugar and calorie containing beverages - soda, sweet tea and juice.  Choose water or unsweetened tea instead.  Past due eye exam - make sure to reschedule appointment with Dr Katy Fitch.    This is a list of the screening recommended for you and due dates:  Health Maintenance  Topic Date Due  . Tetanus Vaccine  06/01/1954 - cost $45  . Eye exam for diabetics  05/09/2016   . Complete foot exam   07/2018  . Flu Shot  07/30/2017  . Hemoglobin A1C  08/24/2017  . Pneumonia vaccines  Completed

## 2017-08-04 NOTE — Telephone Encounter (Signed)
Pt wants to try new med discussed at appt Please advise

## 2017-08-04 NOTE — Telephone Encounter (Signed)
Discussed with patient's PCP, [name] about starting gabapentin for neuropathy - she OK's.  Rx sent to pharmacy and patient notified of dose and side effects to monitor for.

## 2017-08-05 MED ORDER — INSULIN GLARGINE 100 UNIT/ML SOLOSTAR PEN: 66.0000 [IU] | PEN_INJECTOR | Freq: Every day | SUBCUTANEOUS | 1 refills | Status: DC

## 2017-08-05 MED ORDER — INSULIN LISPRO 100 UNIT/ML (KWIKPEN): 20.0000 [IU] | PEN_INJECTOR | Freq: Every day | SUBCUTANEOUS | 1 refills | Status: DC
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
## 2017-08-07 ENCOUNTER — Telehealth: Payer: Self-pay | Admitting: Family

## 2017-08-07 LAB — SPECIMEN STATUS REPORT

## 2017-08-07 LAB — VITAMIN D 25 HYDROXY (VIT D DEFICIENCY, FRACTURES): Vit D, 25-Hydroxy: 27.9 ng/mL — ABNORMAL LOW (ref 30.0–100.0)

## 2017-08-07 NOTE — Telephone Encounter (Signed)
Left message for patient to call to review  Lab results.  All look fine . A1C is 8.0 and was discussed with patient by Pharm-d.  Patient vitamin D low. Take 2000 u daily and can purchase in stores.

## 2017-08-22 ENCOUNTER — Telehealth: Payer: Self-pay | Admitting: Family

## 2017-08-22 NOTE — Telephone Encounter (Signed)
Left message for pt sample is ready for pick up

## 2017-09-10 ENCOUNTER — Telehealth: Payer: Self-pay | Admitting: Family

## 2017-09-11 NOTE — Telephone Encounter (Signed)
Pt has another open phone encounter regarding this, will close encounter.

## 2017-09-12 ENCOUNTER — Other Ambulatory Visit: Payer: Self-pay | Admitting: Family

## 2017-09-12 ENCOUNTER — Other Ambulatory Visit: Payer: Self-pay

## 2017-09-12 DIAGNOSIS — M549 Dorsalgia, unspecified: Principal | ICD-10-CM

## 2017-09-12 DIAGNOSIS — G8929 Other chronic pain: Secondary | ICD-10-CM

## 2017-09-12 MED ORDER — BACLOFEN 10 MG PO TABS: ORAL_TABLET | ORAL | 0 refills | Status: DC

## 2017-09-12 MED ORDER — IRBESARTAN 150 MG PO TABS: 150.0000 mg | ORAL_TABLET | Freq: Every day | ORAL | 0 refills | Status: DC

## 2017-09-12 MED ORDER — ATORVASTATIN CALCIUM 20 MG PO TABS: ORAL_TABLET | ORAL | 0 refills | Status: DC

## 2017-09-12 MED ORDER — VITAMIN D (ERGOCALCIFEROL) 1.25 MG (50000 UNIT) PO CAPS: ORAL_CAPSULE | ORAL | 0 refills | Status: DC

## 2017-09-12 MED ORDER — MELOXICAM 7.5 MG PO TABS: 7.5000 mg | ORAL_TABLET | Freq: Every day | ORAL | 0 refills | Status: DC

## 2017-09-12 MED ORDER — AMLODIPINE BESYLATE 10 MG PO TABS: ORAL_TABLET | ORAL | 0 refills | Status: DC

## 2017-09-12 NOTE — Telephone Encounter (Signed)
[facility] [place]

## 2017-09-12 NOTE — Telephone Encounter (Signed)
Last seen 02/24/17  [facility]

## 2017-09-12 NOTE — Telephone Encounter (Signed)
Last seen 02/24/17  [name]

## 2017-09-12 NOTE — Telephone Encounter (Signed)
Last seen 02/24/17  [facility]

## 2017-09-12 NOTE — Telephone Encounter (Signed)
Patient NTBS for follow up and lab work  

## 2017-09-12 NOTE — Telephone Encounter (Signed)
Last seen 02/24/17  [name]  If approved route to nurse [facility]

## 2017-09-24 ENCOUNTER — Ambulatory Visit (INDEPENDENT_AMBULATORY_CARE_PROVIDER_SITE_OTHER): Payer: Medicare Other

## 2017-09-24 DIAGNOSIS — Z23 Encounter for immunization: Secondary | ICD-10-CM

## 2017-10-06 ENCOUNTER — Other Ambulatory Visit: Payer: Self-pay | Admitting: Family

## 2017-10-06 ENCOUNTER — Telehealth: Payer: Self-pay | Admitting: Family

## 2017-10-06 NOTE — Telephone Encounter (Signed)
Samples in fridge. Patient notified.

## 2017-10-07 ENCOUNTER — Other Ambulatory Visit: Payer: Self-pay | Admitting: Family

## 2017-10-07 NOTE — Telephone Encounter (Signed)
laSt seen 02/24/17  [name]  If approved route [facility]

## 2017-10-08 ENCOUNTER — Other Ambulatory Visit: Payer: Self-pay | Admitting: Family

## 2017-10-08 NOTE — Telephone Encounter (Signed)
Phoned in.

## 2017-10-08 NOTE — Telephone Encounter (Signed)
Last seen 02/24/17  [facility]

## 2017-10-08 NOTE — Telephone Encounter (Signed)
No more refill after today until he is seen. Patient NTBS for follow up and lab work

## 2017-10-09 ENCOUNTER — Other Ambulatory Visit: Payer: Self-pay | Admitting: Family

## 2017-10-11 ENCOUNTER — Other Ambulatory Visit: Payer: Self-pay | Admitting: Family

## 2017-10-13 ENCOUNTER — Other Ambulatory Visit: Payer: Self-pay | Admitting: Family

## 2017-10-21 ENCOUNTER — Telehealth: Payer: Self-pay | Admitting: Family

## 2017-10-21 NOTE — Telephone Encounter (Signed)
Message left , no sample available.

## 2017-10-27 ENCOUNTER — Telehealth: Payer: Self-pay | Admitting: Family

## 2017-10-27 NOTE — Telephone Encounter (Signed)
Left message for patient to call  Another time. We have no samples.

## 2017-11-03 ENCOUNTER — Telehealth: Payer: Self-pay | Admitting: Family

## 2017-11-03 MED ORDER — INSULIN GLARGINE 300 UNIT/ML ~~LOC~~ SOPN: 66.0000 [IU] | PEN_INJECTOR | Freq: Every day | SUBCUTANEOUS | 0 refills | Status: DC

## 2017-11-03 NOTE — Telephone Encounter (Signed)
Sample placed in refrigerator for toujeo. Ok per [place] to give one sample. Patient must be seen for any other samples or medication refill.

## 2017-11-04 ENCOUNTER — Ambulatory Visit (INDEPENDENT_AMBULATORY_CARE_PROVIDER_SITE_OTHER): Payer: Medicare Other | Admitting: Family

## 2017-11-04 ENCOUNTER — Other Ambulatory Visit: Payer: Self-pay | Admitting: Family

## 2017-11-04 ENCOUNTER — Encounter: Payer: Self-pay | Admitting: Family

## 2017-11-04 VITALS — BP 134/60 | HR 104 | Temp 99.3°F | Ht 68.0 in | Wt 235.0 lb

## 2017-11-04 DIAGNOSIS — Z794 Long term (current) use of insulin: Secondary | ICD-10-CM

## 2017-11-04 DIAGNOSIS — G8929 Other chronic pain: Secondary | ICD-10-CM

## 2017-11-04 DIAGNOSIS — E1169 Type 2 diabetes mellitus with other specified complication: Secondary | ICD-10-CM | POA: Diagnosis not present

## 2017-11-04 DIAGNOSIS — F419 Anxiety disorder, unspecified: Secondary | ICD-10-CM

## 2017-11-04 DIAGNOSIS — M545 Low back pain: Secondary | ICD-10-CM | POA: Diagnosis not present

## 2017-11-04 DIAGNOSIS — R350 Frequency of micturition: Secondary | ICD-10-CM | POA: Diagnosis not present

## 2017-11-04 DIAGNOSIS — E1159 Type 2 diabetes mellitus with other circulatory complications: Secondary | ICD-10-CM

## 2017-11-04 DIAGNOSIS — E119 Type 2 diabetes mellitus without complications: Secondary | ICD-10-CM | POA: Diagnosis not present

## 2017-11-04 DIAGNOSIS — R600 Localized edema: Secondary | ICD-10-CM

## 2017-11-04 DIAGNOSIS — I10 Essential (primary) hypertension: Secondary | ICD-10-CM

## 2017-11-04 DIAGNOSIS — K219 Gastro-esophageal reflux disease without esophagitis: Secondary | ICD-10-CM

## 2017-11-04 DIAGNOSIS — E785 Hyperlipidemia, unspecified: Secondary | ICD-10-CM | POA: Diagnosis not present

## 2017-11-04 LAB — MICROSCOPIC EXAMINATION
Epithelial Cells (non renal): NONE SEEN /hpf (ref 0–10)
RENAL EPITHEL UA: NONE SEEN /HPF
WBC UA: NONE SEEN /HPF (ref 0–?)

## 2017-11-04 LAB — URINALYSIS, COMPLETE
BILIRUBIN UA: NEGATIVE
Ketones, UA: NEGATIVE
LEUKOCYTES UA: NEGATIVE
Nitrite, UA: NEGATIVE
PH UA: 5 (ref 5.0–7.5)
PROTEIN UA: NEGATIVE
Specific Gravity, UA: 1.01 (ref 1.005–1.030)
Urobilinogen, Ur: 0.2 mg/dL (ref 0.2–1.0)

## 2017-11-04 LAB — BAYER DCA HB A1C WAIVED: HB A1C (BAYER DCA - WAIVED): 8 % — ABNORMAL HIGH (ref <7.0)

## 2017-11-04 MED ORDER — DIAZEPAM 5 MG PO TABS: 5.0000 mg | ORAL_TABLET | Freq: Two times a day (BID) | ORAL | 5 refills | Status: DC | PRN
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
## 2017-11-04 NOTE — Patient Instructions (Signed)
Diabetes Mellitus and Food It is important for you to manage your blood sugar (glucose) level. Your blood glucose level can be greatly affected by what you eat. Eating healthier foods in the appropriate amounts throughout the day at about the same time each day will help you control your blood glucose level. It can also help slow or prevent worsening of your diabetes mellitus. Healthy eating may even help you improve the level of your blood pressure and reach or maintain a healthy weight. General recommendations for healthful eating and cooking habits include:  Eating meals and snacks regularly. Avoid going long periods of time without eating to lose weight.  Eating a diet that consists mainly of plant-based foods, such as fruits, vegetables, nuts, legumes, and whole grains.  Using low-heat cooking methods, such as baking, instead of high-heat cooking methods, such as deep frying.  Work with your dietitian to make sure you understand how to use the Nutrition Facts information on food labels. How can food affect me? Carbohydrates Carbohydrates affect your blood glucose level more than any other type of food. Your dietitian will help you determine how many carbohydrates to eat at each meal and teach you how to count carbohydrates. Counting carbohydrates is important to keep your blood glucose at a healthy level, especially if you are using insulin or taking certain medicines for diabetes mellitus. Alcohol Alcohol can cause sudden decreases in blood glucose (hypoglycemia), especially if you use insulin or take certain medicines for diabetes mellitus. Hypoglycemia can be a life-threatening condition. Symptoms of hypoglycemia (sleepiness, dizziness, and disorientation) are similar to symptoms of having too much alcohol. If your health care provider has given you approval to drink alcohol, do so in moderation and use the following guidelines:  Women should not have more than one drink per day, and men  should not have more than two drinks per day. One drink is equal to: ? 12 oz of beer. ? 5 oz of wine. ? 1 oz of hard liquor.  Do not drink on an empty stomach.  Keep yourself hydrated. Have water, diet soda, or unsweetened iced tea.  Regular soda, juice, and other mixers might contain a lot of carbohydrates and should be counted.  What foods are not recommended? As you make food choices, it is important to remember that all foods are not the same. Some foods have fewer nutrients per serving than other foods, even though they might have the same number of calories or carbohydrates. It is difficult to get your body what it needs when you eat foods with fewer nutrients. Examples of foods that you should avoid that are high in calories and carbohydrates but low in nutrients include:  Trans fats (most processed foods list trans fats on the Nutrition Facts label).  Regular soda.  Juice.  Candy.  Sweets, such as cake, pie, doughnuts, and cookies.  Fried foods.  What foods can I eat? Eat nutrient-rich foods, which will nourish your body and keep you healthy. The food you should eat also will depend on several factors, including:  The calories you need.  The medicines you take.  Your weight.  Your blood glucose level.  Your blood pressure level.  Your cholesterol level.  You should eat a variety of foods, including:  Protein. ? Lean cuts of meat. ? Proteins low in saturated fats, such as fish, egg whites, and beans. Avoid processed meats.  Fruits and vegetables. ? Fruits and vegetables that may help control blood glucose levels, such as apples,   mangoes, and yams.  Dairy products. ? Choose fat-free or low-fat dairy products, such as milk, yogurt, and cheese.  Grains, bread, pasta, and rice. ? Choose whole grain products, such as multigrain bread, whole oats, and brown rice. These foods may help control blood pressure.  Fats. ? Foods containing healthful fats, such as  nuts, avocado, olive oil, canola oil, and fish.  Does everyone with diabetes mellitus have the same meal plan? Because every person with diabetes mellitus is different, there is not one meal plan that works for everyone. It is very important that you meet with a dietitian who will help you create a meal plan that is just right for you. This information is not intended to replace advice given to you by your health care provider. Make sure you discuss any questions you have with your health care provider. Document Released: 09/12/2005 Document Revised: 05/23/2016 Document Reviewed: 11/12/2013 Elsevier Interactive Patient Education  2017 [employer] Inc[employer]  

## 2017-11-05 LAB — CMP14+EGFR
ALBUMIN: 5.1 g/dL — AB (ref 3.5–4.7)
ALK PHOS: 104 IU/L (ref 39–117)
ALT: 47 IU/L — ABNORMAL HIGH (ref 0–44)
AST: 39 IU/L (ref 0–40)
Albumin/Globulin Ratio: 1.9 (ref 1.2–2.2)
BILIRUBIN TOTAL: 0.3 mg/dL (ref 0.0–1.2)
BUN / CREAT RATIO: 10 (ref 10–24)
BUN: 10 mg/dL (ref 8–27)
CHLORIDE: 100 mmol/L (ref 96–106)
CO2: 20 mmol/L (ref 20–29)
Calcium: 10.2 mg/dL (ref 8.6–10.2)
Creatinine, Ser: 1.03 mg/dL (ref 0.76–1.27)
GFR calc Af Amer: 78 mL/min/{1.73_m2} (ref >59)
GFR calc non Af Amer: 67 mL/min/{1.73_m2} (ref >59)
GLOBULIN, TOTAL: 2.7 g/dL (ref 1.5–4.5)
Glucose: 240 mg/dL — ABNORMAL HIGH (ref 65–99)
POTASSIUM: 4.3 mmol/L (ref 3.5–5.2)
SODIUM: 142 mmol/L (ref 134–144)
Total Protein: 7.8 g/dL (ref 6.0–8.5)

## 2017-11-05 LAB — LIPID PANEL
CHOL/HDL RATIO: 3.6 ratio (ref 0.0–5.0)
Cholesterol, Total: 126 mg/dL (ref 100–199)
HDL: 35 mg/dL — AB (ref >39)
LDL Calculated: 46 mg/dL (ref 0–99)
TRIGLYCERIDES: 225 mg/dL — AB (ref 0–149)
VLDL CHOLESTEROL CAL: 45 mg/dL — AB (ref 5–40)

## 2017-11-06 ENCOUNTER — Other Ambulatory Visit: Payer: Self-pay | Admitting: Family

## 2017-11-06 MED ORDER — EMPAGLIFLOZIN 10 MG PO TABS: 10.0000 mg | ORAL_TABLET | Freq: Every day | ORAL | 1 refills | Status: DC

## 2017-11-06 MED ORDER — CIPROFLOXACIN HCL 500 MG PO TABS: 500.0000 mg | ORAL_TABLET | Freq: Two times a day (BID) | ORAL | 0 refills | Status: DC

## 2017-11-07 ENCOUNTER — Telehealth: Payer: Self-pay | Admitting: Family

## 2017-11-07 LAB — URINE CULTURE

## 2017-11-07 NOTE — Telephone Encounter (Signed)
Aware of labs 

## 2017-11-10 NOTE — Telephone Encounter (Signed)
If this rx is too high, pt can try low carb diet and continue other medications.

## 2017-11-10 NOTE — Telephone Encounter (Signed)
Left message for patient to call for questions but gave provider's advise on working with his diet.

## 2017-11-11 ENCOUNTER — Other Ambulatory Visit: Payer: Self-pay | Admitting: Family

## 2017-11-16 ENCOUNTER — Other Ambulatory Visit: Payer: Self-pay | Admitting: Family

## 2017-11-17 ENCOUNTER — Other Ambulatory Visit: Payer: Self-pay | Admitting: Family

## 2017-11-17 ENCOUNTER — Other Ambulatory Visit: Payer: Self-pay | Admitting: *Deleted

## 2017-11-17 ENCOUNTER — Telehealth: Payer: Self-pay | Admitting: Family

## 2017-11-17 MED ORDER — ONETOUCH DELICA LANCETS 33G MISC: 11 refills | Status: DC

## 2017-11-17 MED ORDER — ONETOUCH DELICA LANCETS 33G MISC: 1 refills | Status: DC

## 2017-11-17 MED ORDER — GLUCOSE BLOOD VI STRP: ORAL_STRIP | 2 refills | Status: DC

## 2017-11-17 NOTE — Telephone Encounter (Signed)
[facility] and message left on voice mail for patient.

## 2017-11-17 NOTE — Telephone Encounter (Signed)
Refill sent to pharm

## 2017-11-17 NOTE — Telephone Encounter (Signed)
Last seen 11/6./18  [facility]

## 2017-11-18 ENCOUNTER — Other Ambulatory Visit: Payer: Self-pay | Admitting: Family

## 2017-11-18 ENCOUNTER — Telehealth: Payer: Self-pay | Admitting: Family

## 2017-11-18 NOTE — Telephone Encounter (Signed)
Last seen 11/04/17  [facility]

## 2017-11-18 NOTE — Telephone Encounter (Signed)
There are #2 Toujeo sample pens available.  Left voicemail for patient that he can pick them up at his convenience.

## 2017-12-02 ENCOUNTER — Ambulatory Visit (INDEPENDENT_AMBULATORY_CARE_PROVIDER_SITE_OTHER): Payer: Medicare Other | Admitting: Family Medicine

## 2017-12-02 ENCOUNTER — Encounter: Payer: Self-pay | Admitting: Family Medicine

## 2017-12-02 ENCOUNTER — Ambulatory Visit: Payer: Medicare Other | Admitting: Family

## 2017-12-02 VITALS — BP 162/82 | HR 108 | Temp 97.9°F | Ht 68.0 in | Wt 242.0 lb

## 2017-12-02 DIAGNOSIS — N138 Other obstructive and reflux uropathy: Secondary | ICD-10-CM

## 2017-12-02 DIAGNOSIS — N401 Enlarged prostate with lower urinary tract symptoms: Secondary | ICD-10-CM

## 2017-12-02 DIAGNOSIS — R3915 Urgency of urination: Secondary | ICD-10-CM | POA: Diagnosis not present

## 2017-12-02 LAB — URINALYSIS
Bilirubin, UA: NEGATIVE
Glucose, UA: NEGATIVE
KETONES UA: NEGATIVE
LEUKOCYTES UA: NEGATIVE
NITRITE UA: NEGATIVE
RBC, UA: NEGATIVE
Specific Gravity, UA: 1.02 (ref 1.005–1.030)
Urobilinogen, Ur: 0.2 mg/dL (ref 0.2–1.0)
pH, UA: 7 (ref 5.0–7.5)

## 2017-12-02 MED ORDER — FINASTERIDE 5 MG PO TABS: 5.0000 mg | ORAL_TABLET | Freq: Every day | ORAL | 5 refills | Status: DC

## 2017-12-02 MED ORDER — DOXYCYCLINE HYCLATE 100 MG PO TABS: 100.0000 mg | ORAL_TABLET | Freq: Two times a day (BID) | ORAL | 0 refills | Status: DC

## 2017-12-02 MED ORDER — TAMSULOSIN HCL 0.4 MG PO CAPS: 0.8000 mg | ORAL_CAPSULE | Freq: Every day | ORAL | 5 refills | Status: DC
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
## 2017-12-03 LAB — URINE CULTURE

## 2017-12-07 ENCOUNTER — Other Ambulatory Visit: Payer: Self-pay | Admitting: Family

## 2017-12-10 ENCOUNTER — Telehealth: Payer: Self-pay | Admitting: Family

## 2017-12-10 ENCOUNTER — Other Ambulatory Visit: Payer: Self-pay | Admitting: Family

## 2017-12-11 ENCOUNTER — Other Ambulatory Visit: Payer: Self-pay | Admitting: Family

## 2017-12-12 ENCOUNTER — Other Ambulatory Visit: Payer: Self-pay | Admitting: *Deleted

## 2017-12-12 ENCOUNTER — Telehealth: Payer: Self-pay | Admitting: Family

## 2017-12-12 MED ORDER — AMLODIPINE BESYLATE 10 MG PO TABS: ORAL_TABLET | ORAL | 0 refills | Status: DC

## 2017-12-12 NOTE — Telephone Encounter (Signed)
Resent electronically after talking with [facility]

## 2017-12-12 NOTE — Telephone Encounter (Signed)
Left message, 2 samples of toujeo are available.

## 2017-12-25 ENCOUNTER — Telehealth: Payer: Self-pay | Admitting: Family

## 2017-12-25 NOTE — Telephone Encounter (Signed)
Left message- we do not have samples of requested medication.

## 2017-12-28 ENCOUNTER — Other Ambulatory Visit: Payer: Self-pay | Admitting: Family Medicine

## 2017-12-31 ENCOUNTER — Telehealth: Payer: Self-pay | Admitting: Family

## 2018-01-01 NOTE — Telephone Encounter (Signed)
Left message , samples of insulin are avaiable for pick up here at [facility].

## 2018-01-02 ENCOUNTER — Other Ambulatory Visit: Payer: Self-pay | Admitting: Family

## 2018-01-02 DIAGNOSIS — I10 Essential (primary) hypertension: Secondary | ICD-10-CM

## 2018-01-02 DIAGNOSIS — R609 Edema, unspecified: Secondary | ICD-10-CM

## 2018-01-14 ENCOUNTER — Other Ambulatory Visit: Payer: Self-pay | Admitting: Family

## 2018-01-14 NOTE — Telephone Encounter (Signed)
Last Vit 08/04/17  27.9

## 2018-01-16 ENCOUNTER — Telehealth: Payer: Self-pay | Admitting: Family

## 2018-01-16 NOTE — Telephone Encounter (Signed)
Left message two samples are ready.

## 2018-02-02 ENCOUNTER — Telehealth: Payer: Self-pay | Admitting: Family

## 2018-02-02 NOTE — Telephone Encounter (Signed)
Aware.Samples are available.

## 2018-02-03 ENCOUNTER — Other Ambulatory Visit: Payer: Self-pay | Admitting: Family

## 2018-02-05 ENCOUNTER — Ambulatory Visit: Payer: Medicare Other | Admitting: Family

## 2018-02-10 ENCOUNTER — Ambulatory Visit: Payer: Medicare Other | Admitting: Family

## 2018-02-11 ENCOUNTER — Ambulatory Visit: Payer: Medicare Other | Admitting: Family

## 2018-02-13 ENCOUNTER — Ambulatory Visit: Payer: Medicare Other | Admitting: Family

## 2018-02-17 ENCOUNTER — Ambulatory Visit (INDEPENDENT_AMBULATORY_CARE_PROVIDER_SITE_OTHER): Payer: Medicare Other | Admitting: Family

## 2018-02-17 ENCOUNTER — Encounter: Payer: Self-pay | Admitting: Family

## 2018-02-17 VITALS — BP 138/62 | HR 89 | Temp 97.0°F | Ht 68.0 in | Wt 245.0 lb

## 2018-02-17 DIAGNOSIS — Z794 Long term (current) use of insulin: Secondary | ICD-10-CM

## 2018-02-17 DIAGNOSIS — F419 Anxiety disorder, unspecified: Secondary | ICD-10-CM

## 2018-02-17 DIAGNOSIS — E119 Type 2 diabetes mellitus without complications: Secondary | ICD-10-CM

## 2018-02-17 DIAGNOSIS — G8929 Other chronic pain: Secondary | ICD-10-CM

## 2018-02-17 DIAGNOSIS — R350 Frequency of micturition: Secondary | ICD-10-CM | POA: Diagnosis not present

## 2018-02-17 DIAGNOSIS — I10 Essential (primary) hypertension: Secondary | ICD-10-CM

## 2018-02-17 DIAGNOSIS — E1159 Type 2 diabetes mellitus with other circulatory complications: Secondary | ICD-10-CM

## 2018-02-17 DIAGNOSIS — R600 Localized edema: Secondary | ICD-10-CM | POA: Diagnosis not present

## 2018-02-17 DIAGNOSIS — E1169 Type 2 diabetes mellitus with other specified complication: Secondary | ICD-10-CM

## 2018-02-17 DIAGNOSIS — M545 Low back pain: Secondary | ICD-10-CM

## 2018-02-17 DIAGNOSIS — N401 Enlarged prostate with lower urinary tract symptoms: Secondary | ICD-10-CM

## 2018-02-17 DIAGNOSIS — E785 Hyperlipidemia, unspecified: Secondary | ICD-10-CM | POA: Diagnosis not present

## 2018-02-17 LAB — BAYER DCA HB A1C WAIVED: HB A1C (BAYER DCA - WAIVED): 8.2 % — ABNORMAL HIGH (ref <7.0)

## 2018-02-17 MED ORDER — FREESTYLE LIBRE SENSOR SYSTEM MISC: 1.0000 | Freq: Three times a day (TID) | 0 refills | Status: DC

## 2018-02-17 MED ORDER — FREESTYLE LIBRE READER DEVI: 1.0000 | Freq: Three times a day (TID) | 0 refills | Status: DC
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
## 2018-02-17 NOTE — Patient Instructions (Signed)
Benign Prostatic Hyperplasia  Benign prostatic hyperplasia (BPH) is an enlarged prostate gland that is caused by the normal aging process and not by cancer. The prostate is a walnut-sized gland that is involved in the production of semen. It is located in front of the rectum and below the bladder. The bladder stores urine and the urethra is the tube that carries the urine out of the body. The prostate may get bigger as a man gets older.  An enlarged prostate can press on the urethra. This can make it harder to pass urine. The build-up of urine in the bladder can cause infection. Back pressure and infection may progress to bladder damage and kidney (renal) failure.  What are the causes?  This condition is part of a normal aging process. However, not all men develop problems from this condition. If the prostate enlarges away from the urethra, urine flow will not be blocked. If it enlarges toward the urethra and compresses it, there will be problems passing urine.  What increases the risk?  This condition is more likely to develop in men over the age of 50 years.  What are the signs or symptoms?  Symptoms of this condition include:  · Getting up often during the night to urinate.  · Needing to urinate frequently during the day.  · Difficulty starting urine flow.  · Decrease in size and strength of your urine stream.  · Leaking (dribbling) after urinating.  · Inability to pass urine. This needs immediate treatment.  · Inability to completely empty your bladder.  · Pain when you pass urine. This is more common if there is also an infection.  · Urinary tract infection (UTI).    How is this diagnosed?  This condition is diagnosed based on your medical history, a physical exam, and your symptoms. Tests will also be done, such as:  · A post-void bladder scan. This measures any amount of urine that may remain in your bladder after you finish urinating.  · A digital rectal exam. In a rectal exam, your health care provider  checks your prostate by putting a lubricated, gloved finger into your rectum to feel the back of your prostate gland. This exam detects the size of your gland and any abnormal lumps or growths.  · An exam of your urine (urinalysis).  · A prostate specific antigen (PSA) screening. This is a blood test used to screen for prostate cancer.  · An ultrasound. This test uses sound waves to electronically produce a picture of your prostate gland.    Your health care provider may refer you to a specialist in kidney and prostate diseases (urologist).  How is this treated?  Once symptoms begin, your health care provider will monitor your condition (active surveillance or watchful waiting). Treatment for this condition will depend on the severity of your condition. Treatment may include:  · Observation and yearly exams. This may be the only treatment needed if your condition and symptoms are mild.  · Medicines to relieve your symptoms, including:  ? Medicines to shrink the prostate.  ? Medicines to relax the muscle of the prostate.  · Surgery in severe cases. Surgery may include:  ? Prostatectomy. In this procedure, the prostate tissue is removed completely through an open incision or with a laparascope or robotics.  ? Transurethral resection of the prostate (TURP). In this procedure, a tool is inserted through the opening at the tip of the penis (urethra). It is used to cut away tissue of   the inner core of the prostate. The pieces are removed through the same opening of the penis. This removes the blockage.  ? Transurethral incision (TUIP). In this procedure, small cuts are made in the prostate. This lessens the prostate's pressure on the urethra.  ? Transurethral microwave thermotherapy (TUMT). This procedure uses microwaves to create heat. The heat destroys and removes a small amount of prostate tissue.  ? Transurethral needle ablation (TUNA). This procedure uses radio frequencies to destroy and remove a small amount of  prostate tissue.  ? Interstitial laser coagulation (ILC). This procedure uses a laser to destroy and remove a small amount of prostate tissue.  ? Transurethral electrovaporization (TUVP). This procedure uses electrodes to destroy and remove a small amount of prostate tissue.  ? Prostatic urethral lift. This procedure inserts an implant to push the lobes of the prostate away from the urethra.    Follow these instructions at home:  · Take over-the-counter and prescription medicines only as told by your health care provider.  · Monitor your symptoms for any changes. Contact your health care provider with any changes.  · Avoid drinking large amounts of liquid before going to bed or out in public.  · Avoid or reduce how much caffeine or alcohol you drink.  · Give yourself time when you urinate.  · Keep all follow-up visits as told by your health care provider. This is important.  Contact a health care provider if:  · You have unexplained back pain.  · Your symptoms do not get better with treatment.  · You develop side effects from the medicine you are taking.  · Your urine becomes very dark or has a bad smell.  · Your lower abdomen becomes distended and you have trouble passing your urine.  Get help right away if:  · You have a fever or chills.  · You suddenly cannot urinate.  · You feel lightheaded, or very dizzy, or you faint.  · There are large amounts of blood or clots in the urine.  · Your urinary problems become hard to manage.  · You develop moderate to severe low back or flank pain. The flank is the side of your body between the ribs and the hip.  These symptoms may represent a serious problem that is an emergency. Do not wait to see if the symptoms will go away. Get medical help right away. Call your local emergency services (911 in the [place] Do not drive yourself to the hospital.  Summary  · Benign prostatic hyperplasia (BPH) is an enlarged prostate that is caused by the normal aging process and not by  cancer.  · An enlarged prostate can press on the urethra. This can make it hard to pass urine.  · This condition is part of a normal aging process and is more likely to develop in men over the age of 50 years.  · Get help right away if you suddenly cannot urinate.  This information is not intended to replace advice given to you by your health care provider. Make sure you discuss any questions you have with your health care provider.  Document Released: 12/16/2005 Document Revised: 01/20/2017 Document Reviewed: 01/20/2017  Elsevier Interactive Patient Education © 2018 [employer]

## 2018-02-18 LAB — LIPID PANEL
CHOLESTEROL TOTAL: 138 mg/dL (ref 100–199)
Chol/HDL Ratio: 3.6 ratio (ref 0.0–5.0)
HDL: 38 mg/dL — AB (ref >39)
LDL Calculated: 42 mg/dL (ref 0–99)
Triglycerides: 289 mg/dL — ABNORMAL HIGH (ref 0–149)
VLDL CHOLESTEROL CAL: 58 mg/dL — AB (ref 5–40)

## 2018-02-18 LAB — CMP14+EGFR
A/G RATIO: 1.9 (ref 1.2–2.2)
ALK PHOS: 95 IU/L (ref 39–117)
ALT: 51 IU/L — AB (ref 0–44)
AST: 43 IU/L — ABNORMAL HIGH (ref 0–40)
Albumin: 5.2 g/dL — ABNORMAL HIGH (ref 3.5–4.7)
BILIRUBIN TOTAL: 0.2 mg/dL (ref 0.0–1.2)
BUN/Creatinine Ratio: 14 (ref 10–24)
BUN: 13 mg/dL (ref 8–27)
CO2: 21 mmol/L (ref 20–29)
Calcium: 10.3 mg/dL — ABNORMAL HIGH (ref 8.6–10.2)
Chloride: 99 mmol/L (ref 96–106)
Creatinine, Ser: 0.9 mg/dL (ref 0.76–1.27)
GFR calc Af Amer: 92 mL/min/{1.73_m2} (ref >59)
GFR calc non Af Amer: 79 mL/min/{1.73_m2} (ref >59)
GLUCOSE: 181 mg/dL — AB (ref 65–99)
Globulin, Total: 2.7 g/dL (ref 1.5–4.5)
POTASSIUM: 4.1 mmol/L (ref 3.5–5.2)
Sodium: 142 mmol/L (ref 134–144)
Total Protein: 7.9 g/dL (ref 6.0–8.5)

## 2018-02-18 LAB — PSA, TOTAL AND FREE
PSA FREE: 0.66 ng/mL
PSA, Free Pct: 22.8 %
Prostate Specific Ag, Serum: 2.9 ng/mL (ref 0.0–4.0)

## 2018-02-18 LAB — MICROALBUMIN / CREATININE URINE RATIO: Microalbumin, Urine: <3 ug/mL

## 2018-02-19 ENCOUNTER — Other Ambulatory Visit: Payer: Self-pay | Admitting: Family

## 2018-02-19 MED ORDER — EMPAGLIFLOZIN 10 MG PO TABS: 10.0000 mg | ORAL_TABLET | Freq: Every day | ORAL | 1 refills | Status: DC

## 2018-02-26 NOTE — Telephone Encounter (Signed)
Samples Toujeo

## 2018-02-27 ENCOUNTER — Telehealth: Payer: Self-pay | Admitting: Family

## 2018-02-27 NOTE — Telephone Encounter (Signed)
Aware.Per message, two samples available.

## 2018-03-04 ENCOUNTER — Telehealth: Payer: Self-pay | Admitting: Family

## 2018-03-04 NOTE — Telephone Encounter (Signed)
appt scheduled for evaluation Pt declined appt until 03/16/2018

## 2018-03-07 ENCOUNTER — Other Ambulatory Visit: Payer: Self-pay | Admitting: Family

## 2018-03-09 NOTE — Telephone Encounter (Signed)
Last seen 12/17/17  [facility]

## 2018-03-12 ENCOUNTER — Telehealth: Payer: Self-pay | Admitting: Family

## 2018-03-12 NOTE — Telephone Encounter (Signed)
Samples given.  

## 2018-03-16 ENCOUNTER — Encounter: Payer: Self-pay | Admitting: Family

## 2018-03-16 ENCOUNTER — Ambulatory Visit (INDEPENDENT_AMBULATORY_CARE_PROVIDER_SITE_OTHER): Payer: Medicare Other | Admitting: Family

## 2018-03-16 VITALS — BP 143/76 | HR 91 | Temp 97.4°F | Ht 68.0 in | Wt 245.0 lb

## 2018-03-16 DIAGNOSIS — R51 Headache: Secondary | ICD-10-CM | POA: Diagnosis not present

## 2018-03-16 DIAGNOSIS — G8929 Other chronic pain: Secondary | ICD-10-CM

## 2018-03-16 DIAGNOSIS — I10 Essential (primary) hypertension: Secondary | ICD-10-CM

## 2018-03-16 DIAGNOSIS — E1159 Type 2 diabetes mellitus with other circulatory complications: Secondary | ICD-10-CM

## 2018-03-16 DIAGNOSIS — I152 Hypertension secondary to endocrine disorders: Secondary | ICD-10-CM

## 2018-03-16 LAB — BMP8+EGFR
BUN / CREAT RATIO: 10 (ref 10–24)
BUN: 8 mg/dL (ref 8–27)
CO2: 22 mmol/L (ref 20–29)
CREATININE: 0.84 mg/dL (ref 0.76–1.27)
Calcium: 10.3 mg/dL — ABNORMAL HIGH (ref 8.6–10.2)
Chloride: 99 mmol/L (ref 96–106)
GFR calc Af Amer: 94 mL/min/{1.73_m2} (ref >59)
GFR, EST NON AFRICAN AMERICAN: 82 mL/min/{1.73_m2} (ref >59)
Glucose: 130 mg/dL — ABNORMAL HIGH (ref 65–99)
Potassium: 3.6 mmol/L (ref 3.5–5.2)
SODIUM: 142 mmol/L (ref 134–144)

## 2018-03-16 NOTE — Patient Instructions (Signed)
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
## 2018-03-27 ENCOUNTER — Telehealth: Payer: Self-pay | Admitting: Family

## 2018-03-27 NOTE — Telephone Encounter (Signed)
Patient aware,per message, sample is ready.

## 2018-03-31 ENCOUNTER — Ambulatory Visit (HOSPITAL_COMMUNITY): Payer: Medicare Other

## 2018-04-04 ENCOUNTER — Other Ambulatory Visit: Payer: Self-pay | Admitting: Family

## 2018-04-04 DIAGNOSIS — R609 Edema, unspecified: Secondary | ICD-10-CM

## 2018-04-04 DIAGNOSIS — I10 Essential (primary) hypertension: Secondary | ICD-10-CM

## 2018-04-08 ENCOUNTER — Other Ambulatory Visit: Payer: Self-pay | Admitting: Family

## 2018-04-10 ENCOUNTER — Telehealth: Payer: Self-pay | Admitting: Family

## 2018-04-13 ENCOUNTER — Telehealth: Payer: Self-pay | Admitting: Family

## 2018-04-13 NOTE — Telephone Encounter (Signed)
No samples- left detailed message.

## 2018-04-13 NOTE — Telephone Encounter (Signed)
No samples- pt aware. 

## 2018-04-15 ENCOUNTER — Telehealth: Payer: Self-pay | Admitting: Family

## 2018-04-15 NOTE — Telephone Encounter (Signed)
Aware, per voice mail, no sample available.

## 2018-04-20 ENCOUNTER — Telehealth: Payer: Self-pay | Admitting: Family

## 2018-04-21 NOTE — Telephone Encounter (Signed)
This office is out of toujeo samples at this time

## 2018-04-23 ENCOUNTER — Telehealth: Payer: Self-pay | Admitting: Family

## 2018-04-23 NOTE — Telephone Encounter (Signed)
Left detailed message stating we are currently out of samples of Toujeo but to call back next week and check on status.

## 2018-04-28 ENCOUNTER — Telehealth: Payer: Self-pay | Admitting: Family

## 2018-04-29 NOTE — Telephone Encounter (Signed)
No samples- left detailed message.

## 2018-05-06 ENCOUNTER — Telehealth: Payer: Self-pay | Admitting: Family

## 2018-05-07 NOTE — Telephone Encounter (Signed)
2 pens in fridge for pt, he is aware

## 2018-05-09 ENCOUNTER — Other Ambulatory Visit: Payer: Self-pay | Admitting: Family Medicine

## 2018-05-12 ENCOUNTER — Other Ambulatory Visit: Payer: Self-pay | Admitting: Family

## 2018-05-12 ENCOUNTER — Other Ambulatory Visit: Payer: Self-pay | Admitting: Family Medicine

## 2018-05-13 ENCOUNTER — Other Ambulatory Visit: Payer: Self-pay | Admitting: Family

## 2018-05-22 ENCOUNTER — Telehealth: Payer: Self-pay | Admitting: Family

## 2018-05-22 NOTE — Telephone Encounter (Signed)
2 samples are in refrigerator door for patient.

## 2018-05-22 NOTE — Telephone Encounter (Signed)
Pt aware and verbalized understanding.  

## 2018-06-04 ENCOUNTER — Telehealth: Payer: Self-pay | Admitting: Family

## 2018-06-05 NOTE — Telephone Encounter (Signed)
Left message on voice mail,  two samples of Toujeo available.

## 2018-06-07 ENCOUNTER — Other Ambulatory Visit: Payer: Self-pay | Admitting: Family

## 2018-06-09 ENCOUNTER — Other Ambulatory Visit: Payer: Self-pay | Admitting: Family Medicine

## 2018-06-22 ENCOUNTER — Other Ambulatory Visit: Payer: Self-pay | Admitting: Family Medicine

## 2018-06-24 ENCOUNTER — Telehealth: Payer: Self-pay | Admitting: Family

## 2018-06-24 NOTE — Telephone Encounter (Signed)
Aware.  No samples available. 

## 2018-07-01 ENCOUNTER — Telehealth: Payer: Self-pay | Admitting: Family

## 2018-07-01 ENCOUNTER — Other Ambulatory Visit: Payer: Self-pay | Admitting: Family

## 2018-07-01 NOTE — Telephone Encounter (Signed)
Last VitD 08/04/17  27.9 

## 2018-07-01 NOTE — Telephone Encounter (Signed)
Aware.  No samples available. 

## 2018-07-04 ENCOUNTER — Other Ambulatory Visit: Payer: Self-pay | Admitting: Family

## 2018-07-04 DIAGNOSIS — R609 Edema, unspecified: Secondary | ICD-10-CM

## 2018-07-04 DIAGNOSIS — I10 Essential (primary) hypertension: Secondary | ICD-10-CM

## 2018-07-07 ENCOUNTER — Telehealth: Payer: Self-pay | Admitting: Family

## 2018-07-07 NOTE — Telephone Encounter (Signed)
Left message stating that this office is out of toujeo samples at this time

## 2018-07-08 ENCOUNTER — Telehealth: Payer: Self-pay | Admitting: Family

## 2018-07-08 ENCOUNTER — Other Ambulatory Visit: Payer: Self-pay | Admitting: Family

## 2018-07-08 DIAGNOSIS — I10 Essential (primary) hypertension: Secondary | ICD-10-CM

## 2018-07-08 DIAGNOSIS — R609 Edema, unspecified: Secondary | ICD-10-CM

## 2018-07-08 NOTE — Telephone Encounter (Signed)
Left message, no samples available.

## 2018-07-16 ENCOUNTER — Other Ambulatory Visit: Payer: Self-pay

## 2018-07-16 ENCOUNTER — Telehealth: Payer: Self-pay | Admitting: *Deleted

## 2018-07-16 MED ORDER — LOSARTAN POTASSIUM 100 MG PO TABS: 100.0000 mg | ORAL_TABLET | Freq: Every day | ORAL | 3 refills | Status: DC

## 2018-07-16 NOTE — Telephone Encounter (Signed)
Irbesartan backed ordered. Pt can complete this rx, but when that rx is complete he will start losartan. DO NOT TAKE AT SAME TIME!!!

## 2018-07-16 NOTE — Telephone Encounter (Signed)
Pt is coming in tomorrow to straighten this out

## 2018-07-16 NOTE — Telephone Encounter (Signed)
Fax received [facility] Irbesartan 150 mg tab is manufacturer backordered Please advise on alternative

## 2018-07-16 NOTE — Patient Outreach (Signed)
[facility]) Care Management  07/16/2018  Payam Gribble [birth year] [phone]   Medication adherence call to [name] spoke with patient he was a little bit hesitant on giving his information because he has been getting a lot of cold call but he ask if we can contact [place] and order Irbasartan 150 mg and [place] said Irbasartan 150 mg is on back order and doctor has switch to Losartan, call patient back but did not answer left a message for patient to call me back.Mr. [name] is showing past due under [place].  [employer] Management Direct Dial [phone]  Fax [phone] [email or web address]

## 2018-07-17 ENCOUNTER — Telehealth: Payer: Self-pay | Admitting: Family

## 2018-07-17 ENCOUNTER — Encounter: Payer: Self-pay | Admitting: Family

## 2018-07-17 ENCOUNTER — Ambulatory Visit (INDEPENDENT_AMBULATORY_CARE_PROVIDER_SITE_OTHER): Payer: Medicare Other | Admitting: Family

## 2018-07-17 VITALS — BP 133/76 | HR 87 | Temp 98.0°F | Ht 68.0 in | Wt 231.2 lb

## 2018-07-17 DIAGNOSIS — E559 Vitamin D deficiency, unspecified: Secondary | ICD-10-CM | POA: Diagnosis not present

## 2018-07-17 DIAGNOSIS — M545 Low back pain: Secondary | ICD-10-CM | POA: Diagnosis not present

## 2018-07-17 DIAGNOSIS — Z794 Long term (current) use of insulin: Secondary | ICD-10-CM

## 2018-07-17 DIAGNOSIS — E1169 Type 2 diabetes mellitus with other specified complication: Secondary | ICD-10-CM | POA: Diagnosis not present

## 2018-07-17 DIAGNOSIS — I10 Essential (primary) hypertension: Secondary | ICD-10-CM

## 2018-07-17 DIAGNOSIS — E119 Type 2 diabetes mellitus without complications: Secondary | ICD-10-CM

## 2018-07-17 DIAGNOSIS — E785 Hyperlipidemia, unspecified: Secondary | ICD-10-CM

## 2018-07-17 DIAGNOSIS — G40909 Epilepsy, unspecified, not intractable, without status epilepticus: Secondary | ICD-10-CM | POA: Diagnosis not present

## 2018-07-17 DIAGNOSIS — G8929 Other chronic pain: Secondary | ICD-10-CM

## 2018-07-17 DIAGNOSIS — N400 Enlarged prostate without lower urinary tract symptoms: Secondary | ICD-10-CM | POA: Insufficient documentation

## 2018-07-17 DIAGNOSIS — N401 Enlarged prostate with lower urinary tract symptoms: Secondary | ICD-10-CM | POA: Diagnosis not present

## 2018-07-17 DIAGNOSIS — K219 Gastro-esophageal reflux disease without esophagitis: Secondary | ICD-10-CM | POA: Diagnosis not present

## 2018-07-17 DIAGNOSIS — E1159 Type 2 diabetes mellitus with other circulatory complications: Secondary | ICD-10-CM

## 2018-07-17 DIAGNOSIS — F419 Anxiety disorder, unspecified: Secondary | ICD-10-CM

## 2018-07-17 DIAGNOSIS — R351 Nocturia: Secondary | ICD-10-CM

## 2018-07-17 LAB — CMP14+EGFR
ALT: 67 IU/L — AB (ref 0–44)
AST: 66 IU/L — ABNORMAL HIGH (ref 0–40)
Albumin/Globulin Ratio: 1.7 (ref 1.2–2.2)
Albumin: 5 g/dL — ABNORMAL HIGH (ref 3.5–4.7)
Alkaline Phosphatase: 103 IU/L (ref 39–117)
BUN/Creatinine Ratio: 9 — ABNORMAL LOW (ref 10–24)
BUN: 9 mg/dL (ref 8–27)
Bilirubin Total: 0.2 mg/dL (ref 0.0–1.2)
CALCIUM: 10 mg/dL (ref 8.6–10.2)
CO2: 20 mmol/L (ref 20–29)
Chloride: 100 mmol/L (ref 96–106)
Creatinine, Ser: 0.98 mg/dL (ref 0.76–1.27)
GFR, EST AFRICAN AMERICAN: 82 mL/min/{1.73_m2} (ref >59)
GFR, EST NON AFRICAN AMERICAN: 71 mL/min/{1.73_m2} (ref >59)
Globulin, Total: 3 g/dL (ref 1.5–4.5)
Glucose: 133 mg/dL — ABNORMAL HIGH (ref 65–99)
Potassium: 4 mmol/L (ref 3.5–5.2)
SODIUM: 139 mmol/L (ref 134–144)
TOTAL PROTEIN: 8 g/dL (ref 6.0–8.5)

## 2018-07-17 LAB — BAYER DCA HB A1C WAIVED: HB A1C (BAYER DCA - WAIVED): 8.1 % — ABNORMAL HIGH (ref <7.0)

## 2018-07-17 MED ORDER — IRBESARTAN 150 MG PO TABS: 150.0000 mg | ORAL_TABLET | Freq: Every day | ORAL | 2 refills | Status: DC

## 2018-07-17 MED ORDER — LOSARTAN POTASSIUM 100 MG PO TABS: 100.0000 mg | ORAL_TABLET | Freq: Every day | ORAL | 3 refills | Status: DC

## 2018-07-17 MED ORDER — DIAZEPAM 5 MG PO TABS: ORAL_TABLET | ORAL | 5 refills | Status: DC
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
## 2018-07-17 NOTE — Telephone Encounter (Signed)
Spoke with pharmacist at [facility] and advised to d/c Irbesartan and to continue to fill Losartan.

## 2018-07-17 NOTE — Telephone Encounter (Signed)
Pt requested refill of ibesartan. I did not know he picked up losartan. We will discontinue ibesartan and start losartan.

## 2018-07-17 NOTE — Patient Instructions (Signed)

## 2018-07-17 NOTE — Telephone Encounter (Signed)
[facility] pharmacy states Irbesartan is on back order and pt just picked up Losartan 100 yesterday. Would you like pt to stay on Losartan or did you want something else to replace the Irbesartan.

## 2018-07-20 ENCOUNTER — Other Ambulatory Visit: Payer: Self-pay | Admitting: Family

## 2018-07-20 MED ORDER — METFORMIN HCL ER 500 MG PO TB24: 1500.0000 mg | ORAL_TABLET | Freq: Every day | ORAL | 0 refills | Status: DC

## 2018-07-23 ENCOUNTER — Other Ambulatory Visit: Payer: Self-pay | Admitting: Family

## 2018-07-23 MED ORDER — INSULIN GLARGINE 300 UNIT/ML ~~LOC~~ SOPN: 8.0000 [IU] | PEN_INJECTOR | Freq: Every day | SUBCUTANEOUS | 1 refills | Status: DC

## 2018-07-27 ENCOUNTER — Telehealth: Payer: Self-pay | Admitting: Family

## 2018-07-28 MED ORDER — INSULIN GLARGINE 100 UNIT/ML SOLOSTAR PEN: 8.0000 [IU] | PEN_INJECTOR | Freq: Every day | SUBCUTANEOUS | 99 refills | Status: DC

## 2018-07-28 NOTE — Telephone Encounter (Signed)
Patient aware, lantus script is ready, per voice mail left on his phone.

## 2018-07-28 NOTE — Telephone Encounter (Signed)
Lantus Prescription sent to pharmacy. He can check the price of this. Low carb diet and exercise.

## 2018-07-29 ENCOUNTER — Telehealth: Payer: Self-pay | Admitting: Family

## 2018-07-29 DIAGNOSIS — Z794 Long term (current) use of insulin: Principal | ICD-10-CM

## 2018-07-29 DIAGNOSIS — E119 Type 2 diabetes mellitus without complications: Secondary | ICD-10-CM

## 2018-07-30 NOTE — Telephone Encounter (Signed)
Pt aware of [facility] referral.

## 2018-07-30 NOTE — Telephone Encounter (Signed)
Will do [facility] referral to see if they can help with medication management and find it cost effective for him.

## 2018-07-30 NOTE — Telephone Encounter (Signed)
Pt aware.

## 2018-07-30 NOTE — Telephone Encounter (Signed)
rc for nurse Florentina Jenny

## 2018-08-08 ENCOUNTER — Other Ambulatory Visit: Payer: Self-pay | Admitting: Family

## 2018-08-09 ENCOUNTER — Other Ambulatory Visit: Payer: Self-pay | Admitting: Family

## 2018-09-03 ENCOUNTER — Other Ambulatory Visit: Payer: Self-pay | Admitting: Family

## 2018-09-22 ENCOUNTER — Other Ambulatory Visit: Payer: Self-pay | Admitting: Family

## 2018-09-23 ENCOUNTER — Ambulatory Visit (INDEPENDENT_AMBULATORY_CARE_PROVIDER_SITE_OTHER): Payer: Medicare Other

## 2018-09-23 DIAGNOSIS — Z23 Encounter for immunization: Secondary | ICD-10-CM | POA: Diagnosis not present

## 2018-09-24 ENCOUNTER — Telehealth: Payer: Self-pay | Admitting: Family

## 2018-09-24 NOTE — Telephone Encounter (Signed)
This med is not on med list - do you know if he has gotten samples of this in past

## 2018-09-25 NOTE — Telephone Encounter (Signed)
Aware per message left on voice mail.  Two samples of insulin available and stored in refridgerator.

## 2018-09-25 NOTE — Telephone Encounter (Signed)
Yes patient can have sample. We deleted this from his med list because he could not afford.

## 2018-09-27 ENCOUNTER — Other Ambulatory Visit: Payer: Self-pay | Admitting: Family

## 2018-09-28 ENCOUNTER — Other Ambulatory Visit: Payer: Self-pay | Admitting: Family

## 2018-09-28 DIAGNOSIS — I10 Essential (primary) hypertension: Secondary | ICD-10-CM

## 2018-09-28 DIAGNOSIS — R609 Edema, unspecified: Secondary | ICD-10-CM

## 2018-10-04 ENCOUNTER — Other Ambulatory Visit: Payer: Self-pay | Admitting: Family

## 2018-10-05 ENCOUNTER — Other Ambulatory Visit: Payer: Self-pay | Admitting: Family

## 2018-10-05 DIAGNOSIS — M549 Dorsalgia, unspecified: Principal | ICD-10-CM

## 2018-10-05 DIAGNOSIS — G8929 Other chronic pain: Secondary | ICD-10-CM

## 2018-10-05 NOTE — Telephone Encounter (Signed)
Last VitD 08/04/17  27.9 

## 2018-10-08 ENCOUNTER — Telehealth: Payer: Self-pay | Admitting: Family

## 2018-10-08 ENCOUNTER — Other Ambulatory Visit: Payer: Self-pay | Admitting: Family

## 2018-10-08 ENCOUNTER — Other Ambulatory Visit: Payer: Self-pay | Admitting: *Deleted

## 2018-10-08 DIAGNOSIS — M545 Low back pain, unspecified: Secondary | ICD-10-CM

## 2018-10-08 DIAGNOSIS — G8929 Other chronic pain: Secondary | ICD-10-CM

## 2018-10-08 MED ORDER — METFORMIN HCL ER 500 MG PO TB24: 1500.0000 mg | ORAL_TABLET | Freq: Every day | ORAL | 0 refills | Status: DC

## 2018-10-08 NOTE — Telephone Encounter (Signed)
Referral to Ortho placed.  °

## 2018-10-08 NOTE — Telephone Encounter (Signed)
Last seen 07/17/18

## 2018-10-08 NOTE — Telephone Encounter (Signed)
Advise please.

## 2018-10-08 NOTE — Telephone Encounter (Signed)
Fax from [facility] request new Rx for 3 tabs QD pt stated this is how he is to be taking this. Med was increased on 07/20/18. Sent corrected Rx to pharmacy

## 2018-10-09 ENCOUNTER — Other Ambulatory Visit: Payer: Self-pay | Admitting: Family

## 2018-10-12 ENCOUNTER — Other Ambulatory Visit: Payer: Self-pay | Admitting: Family

## 2018-10-12 DIAGNOSIS — G8929 Other chronic pain: Secondary | ICD-10-CM

## 2018-10-12 DIAGNOSIS — M545 Low back pain, unspecified: Secondary | ICD-10-CM

## 2018-10-16 ENCOUNTER — Telehealth: Payer: Self-pay | Admitting: Family

## 2018-10-16 ENCOUNTER — Encounter: Payer: Self-pay | Admitting: Family

## 2018-10-16 ENCOUNTER — Ambulatory Visit (INDEPENDENT_AMBULATORY_CARE_PROVIDER_SITE_OTHER): Payer: Medicare Other | Admitting: Family

## 2018-10-16 VITALS — BP 136/64 | HR 95 | Temp 97.0°F | Ht 68.0 in | Wt 235.0 lb

## 2018-10-16 DIAGNOSIS — E1169 Type 2 diabetes mellitus with other specified complication: Secondary | ICD-10-CM | POA: Diagnosis not present

## 2018-10-16 DIAGNOSIS — E119 Type 2 diabetes mellitus without complications: Secondary | ICD-10-CM

## 2018-10-16 DIAGNOSIS — E1159 Type 2 diabetes mellitus with other circulatory complications: Secondary | ICD-10-CM

## 2018-10-16 DIAGNOSIS — N401 Enlarged prostate with lower urinary tract symptoms: Secondary | ICD-10-CM

## 2018-10-16 DIAGNOSIS — G40909 Epilepsy, unspecified, not intractable, without status epilepticus: Secondary | ICD-10-CM

## 2018-10-16 DIAGNOSIS — M545 Low back pain, unspecified: Secondary | ICD-10-CM

## 2018-10-16 DIAGNOSIS — G8929 Other chronic pain: Secondary | ICD-10-CM

## 2018-10-16 DIAGNOSIS — Z794 Long term (current) use of insulin: Secondary | ICD-10-CM

## 2018-10-16 DIAGNOSIS — E785 Hyperlipidemia, unspecified: Secondary | ICD-10-CM

## 2018-10-16 DIAGNOSIS — E559 Vitamin D deficiency, unspecified: Secondary | ICD-10-CM

## 2018-10-16 DIAGNOSIS — Z79899 Other long term (current) drug therapy: Secondary | ICD-10-CM | POA: Diagnosis not present

## 2018-10-16 DIAGNOSIS — F419 Anxiety disorder, unspecified: Secondary | ICD-10-CM

## 2018-10-16 DIAGNOSIS — I10 Essential (primary) hypertension: Secondary | ICD-10-CM

## 2018-10-16 DIAGNOSIS — F1320 Sedative, hypnotic or anxiolytic dependence, uncomplicated: Secondary | ICD-10-CM | POA: Insufficient documentation

## 2018-10-16 DIAGNOSIS — R351 Nocturia: Secondary | ICD-10-CM

## 2018-10-16 MED ORDER — DIAZEPAM 5 MG PO TABS: ORAL_TABLET | ORAL | 5 refills | Status: DC

## 2018-10-16 MED ORDER — ATORVASTATIN CALCIUM 20 MG PO TABS: 20.0000 mg | ORAL_TABLET | Freq: Every day | ORAL | 3 refills | Status: DC

## 2018-10-16 NOTE — Telephone Encounter (Signed)
Noted - added to med list

## 2018-10-16 NOTE — Patient Instructions (Signed)
Diabetes Mellitus and Nutrition When you have diabetes (diabetes mellitus), it is very important to have healthy eating habits because your blood sugar (glucose) levels are greatly affected by what you eat and drink. Eating healthy foods in the appropriate amounts, at about the same times every day, can help you:  Control your blood glucose.  Lower your risk of heart disease.  Improve your blood pressure.  Reach or maintain a healthy weight.  Every person with diabetes is different, and each person has different needs for a meal plan. Your health care provider may recommend that you work with a diet and nutrition specialist (dietitian) to make a meal plan that is best for you. Your meal plan may vary depending on factors such as:  The calories you need.  The medicines you take.  Your weight.  Your blood glucose, blood pressure, and cholesterol levels.  Your activity level.  Other health conditions you have, such as heart or kidney disease.  How do carbohydrates affect me? Carbohydrates affect your blood glucose level more than any other type of food. Eating carbohydrates naturally increases the amount of glucose in your blood. Carbohydrate counting is a method for keeping track of how many carbohydrates you eat. Counting carbohydrates is important to keep your blood glucose at a healthy level, especially if you use insulin or take certain oral diabetes medicines. It is important to know how many carbohydrates you can safely have in each meal. This is different for every person. Your dietitian can help you calculate how many carbohydrates you should have at each meal and for snack. Foods that contain carbohydrates include:  Bread, cereal, rice, pasta, and crackers.  Potatoes and corn.  Peas, beans, and lentils.  Milk and yogurt.  Fruit and juice.  Desserts, such as cakes, cookies, ice cream, and candy.  How does alcohol affect me? Alcohol can cause a sudden decrease in blood  glucose (hypoglycemia), especially if you use insulin or take certain oral diabetes medicines. Hypoglycemia can be a life-threatening condition. Symptoms of hypoglycemia (sleepiness, dizziness, and confusion) are similar to symptoms of having too much alcohol. If your health care provider says that alcohol is safe for you, follow these guidelines:  Limit alcohol intake to no more than 1 drink per day for nonpregnant women and 2 drinks per day for men. One drink equals 12 oz of beer, 5 oz of wine, or 1 oz of hard liquor.  Do not drink on an empty stomach.  Keep yourself hydrated with water, diet soda, or unsweetened iced tea.  Keep in mind that regular soda, juice, and other mixers may contain a lot of sugar and must be counted as carbohydrates.  What are tips for following this plan? Reading food labels  Start by checking the serving size on the label. The amount of calories, carbohydrates, fats, and other nutrients listed on the label are based on one serving of the food. Many foods contain more than one serving per package.  Check the total grams (g) of carbohydrates in one serving. You can calculate the number of servings of carbohydrates in one serving by dividing the total carbohydrates by 15. For example, if a food has 30 g of total carbohydrates, it would be equal to 2 servings of carbohydrates.  Check the number of grams (g) of saturated and trans fats in one serving. Choose foods that have low or no amount of these fats.  Check the number of milligrams (mg) of sodium in one serving. Most people   should limit total sodium intake to less than 2,300 mg per day.  Always check the nutrition information of foods labeled as "low-fat" or "nonfat". These foods may be higher in added sugar or refined carbohydrates and should be avoided.  Talk to your dietitian to identify your daily goals for nutrients listed on the label. Shopping  Avoid buying canned, premade, or processed foods. These  foods tend to be high in fat, sodium, and added sugar.  Shop around the outside edge of the grocery store. This includes fresh fruits and vegetables, bulk grains, fresh meats, and fresh dairy. Cooking  Use low-heat cooking methods, such as baking, instead of high-heat cooking methods like deep frying.  Cook using healthy oils, such as olive, canola, or sunflower oil.  Avoid cooking with butter, cream, or high-fat meats. Meal planning  Eat meals and snacks regularly, preferably at the same times every day. Avoid going long periods of time without eating.  Eat foods high in fiber, such as fresh fruits, vegetables, beans, and whole grains. Talk to your dietitian about how many servings of carbohydrates you can eat at each meal.  Eat 4-6 ounces of lean protein each day, such as lean meat, chicken, fish, eggs, or tofu. 1 ounce is equal to 1 ounce of meat, chicken, or fish, 1 egg, or 1/4 cup of tofu.  Eat some foods each day that contain healthy fats, such as avocado, nuts, seeds, and fish. Lifestyle   Check your blood glucose regularly.  Exercise at least 30 minutes 5 or more days each week, or as told by your health care provider.  Take medicines as told by your health care provider.  Do not use any products that contain nicotine or tobacco, such as cigarettes and e-cigarettes. If you need help quitting, ask your health care provider.  Work with a counselor or diabetes educator to identify strategies to manage stress and any emotional and social challenges. What are some questions to ask my health care provider?  Do I need to meet with a diabetes educator?  Do I need to meet with a dietitian?  What number can I call if I have questions?  When are the best times to check my blood glucose? Where to find more information:  American Diabetes Association: diabetes.org/food-and-fitness/food  Academy of Nutrition and Dietetics:  www.eatright.org/resources/health/diseases-and-conditions/diabetes  National Institute of Diabetes and Digestive and Kidney Diseases (NIH): www.niddk.nih.gov/health-information/diabetes/overview/diet-eating-physical-activity Summary  A healthy meal plan will help you control your blood glucose and maintain a healthy lifestyle.  Working with a diet and nutrition specialist (dietitian) can help you make a meal plan that is best for you.  Keep in mind that carbohydrates and alcohol have immediate effects on your blood glucose levels. It is important to count carbohydrates and to use alcohol carefully. This information is not intended to replace advice given to you by your health care provider. Make sure you discuss any questions you have with your health care provider. Document Released: 09/12/2005 Document Revised: 01/20/2017 Document Reviewed: 01/20/2017 Elsevier Interactive Patient Education  2018 Elsevier Inc.  
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
## 2018-10-16 NOTE — Addendum Note (Signed)
Addended by: [name] A on: 10/16/2018 10:31 AM   Modules accepted: Orders

## 2018-10-19 ENCOUNTER — Telehealth: Payer: Self-pay | Admitting: Family

## 2018-10-19 NOTE — Telephone Encounter (Signed)
PT states that he left a urine last Friday at his apt, wants to know results, I advised him that it looked like there was no order or results in there would have nurse look at it and give him a call, please advise

## 2018-10-19 NOTE — Telephone Encounter (Signed)
Left message for patient to call for questipons.  He should come to office for blood work. Tox assure not resulted yet.

## 2018-10-20 ENCOUNTER — Ambulatory Visit: Payer: Medicare Other | Admitting: Physical Therapy

## 2018-10-21 LAB — TOXASSURE SELECT 13 (MW), URINE

## 2018-10-22 ENCOUNTER — Other Ambulatory Visit: Payer: Medicare Other

## 2018-10-22 DIAGNOSIS — E1169 Type 2 diabetes mellitus with other specified complication: Secondary | ICD-10-CM | POA: Diagnosis not present

## 2018-10-22 DIAGNOSIS — I10 Essential (primary) hypertension: Secondary | ICD-10-CM | POA: Diagnosis not present

## 2018-10-22 DIAGNOSIS — E1159 Type 2 diabetes mellitus with other circulatory complications: Secondary | ICD-10-CM | POA: Diagnosis not present

## 2018-10-22 DIAGNOSIS — Z794 Long term (current) use of insulin: Secondary | ICD-10-CM | POA: Diagnosis not present

## 2018-10-22 DIAGNOSIS — E119 Type 2 diabetes mellitus without complications: Secondary | ICD-10-CM | POA: Diagnosis not present

## 2018-10-22 LAB — BAYER DCA HB A1C WAIVED: HB A1C: 8.8 % — AB (ref <7.0)

## 2018-10-23 LAB — CMP14+EGFR
ALBUMIN: 5 g/dL — AB (ref 3.5–4.7)
ALK PHOS: 105 IU/L (ref 39–117)
ALT: 61 IU/L — ABNORMAL HIGH (ref 0–44)
AST: 57 IU/L — ABNORMAL HIGH (ref 0–40)
Albumin/Globulin Ratio: 1.7 (ref 1.2–2.2)
BUN / CREAT RATIO: 14 (ref 10–24)
BUN: 12 mg/dL (ref 8–27)
Bilirubin Total: 0.4 mg/dL (ref 0.0–1.2)
CALCIUM: 9.9 mg/dL (ref 8.6–10.2)
CO2: 20 mmol/L (ref 20–29)
CREATININE: 0.84 mg/dL (ref 0.76–1.27)
Chloride: 97 mmol/L (ref 96–106)
GFR calc Af Amer: 94 mL/min/{1.73_m2} (ref >59)
GFR, EST NON AFRICAN AMERICAN: 81 mL/min/{1.73_m2} (ref >59)
GLOBULIN, TOTAL: 3 g/dL (ref 1.5–4.5)
GLUCOSE: 211 mg/dL — AB (ref 65–99)
Potassium: 4.3 mmol/L (ref 3.5–5.2)
Sodium: 137 mmol/L (ref 134–144)
TOTAL PROTEIN: 8 g/dL (ref 6.0–8.5)

## 2018-10-23 LAB — LIPID PANEL
CHOL/HDL RATIO: 3.6 ratio (ref 0.0–5.0)
Cholesterol, Total: 153 mg/dL (ref 100–199)
HDL: 42 mg/dL (ref >39)
LDL Calculated: 70 mg/dL (ref 0–99)
Triglycerides: 203 mg/dL — ABNORMAL HIGH (ref 0–149)
VLDL Cholesterol Cal: 41 mg/dL — ABNORMAL HIGH (ref 5–40)

## 2018-10-23 LAB — CBC WITH DIFFERENTIAL/PLATELET
Basophils Absolute: 0 10*3/uL (ref 0.0–0.2)
Basos: 1 %
EOS (ABSOLUTE): 0.1 10*3/uL (ref 0.0–0.4)
Eos: 2 %
HEMOGLOBIN: 14.6 g/dL (ref 13.0–17.7)
Hematocrit: 44.1 % (ref 37.5–51.0)
IMMATURE GRANS (ABS): 0 10*3/uL (ref 0.0–0.1)
IMMATURE GRANULOCYTES: 0 %
LYMPHS ABS: 3.6 10*3/uL — AB (ref 0.7–3.1)
Lymphs: 43 %
MCH: 28.2 pg (ref 26.6–33.0)
MCHC: 33.1 g/dL (ref 31.5–35.7)
MCV: 85 fL (ref 79–97)
Monocytes Absolute: 0.7 10*3/uL (ref 0.1–0.9)
Monocytes: 8 %
NEUTROS ABS: 4 10*3/uL (ref 1.4–7.0)
Neutrophils: 46 %
Platelets: 323 10*3/uL (ref 150–450)
RBC: 5.18 x10E6/uL (ref 4.14–5.80)
RDW: 14 % (ref 12.3–15.4)
WBC: 8.6 10*3/uL (ref 3.4–10.8)

## 2018-10-26 ENCOUNTER — Other Ambulatory Visit: Payer: Self-pay | Admitting: *Deleted

## 2018-10-26 NOTE — Patient Outreach (Signed)
[facility]) Care Management  10/26/2018  [name] July 05, 1933 [id number]  TELEPHONE SCREENING Referral date: 10/16/2018 Referral source: [name] NP Referral reason: Medication assistance Insurance: [employer] call to patient regarding medication assistance. Phone call placed to home number. No answer.   PLAN: RN CM will attempt call in 4 business. Unsuccessful outreach attempt letter sent.   Lanae Boast RN BSN [facility], [facility] Dial:  [phone] Fax: [phone] [address],   [place],  [place]  [address]  Website:  [email or web address]

## 2018-10-28 ENCOUNTER — Ambulatory Visit (INDEPENDENT_AMBULATORY_CARE_PROVIDER_SITE_OTHER): Payer: Medicare Other | Admitting: *Deleted

## 2018-10-28 ENCOUNTER — Encounter: Payer: Self-pay | Admitting: *Deleted

## 2018-10-28 VITALS — BP 155/72 | HR 94 | Ht 68.0 in | Wt 233.0 lb

## 2018-10-28 DIAGNOSIS — Z0000 Encounter for general adult medical examination without abnormal findings: Secondary | ICD-10-CM | POA: Diagnosis not present

## 2018-10-28 NOTE — Patient Instructions (Addendum)
Please work on your goal of maintaining your exercise regimen and as healthy diet as possible.   Please return the call to the [place]: for medication assistance [place] Telephonic Nurse Case Counselling psychologist Dial: [phone]  Please follow up with Dale Dun, FNP as scheduled.  Thank you for coming in for your Annual Wellness Visit today!!   Preventive Care 65 Years and Older, Male Preventive care refers to lifestyle choices and visits with your health care provider that can promote health and wellness. What does preventive care include?  A yearly physical exam. This is also called an annual well check.  Dental exams once or twice a year.  Routine eye exams. Ask your health care provider how often you should have your eyes checked.  Personal lifestyle choices, including: ? Daily care of your teeth and gums. ? Regular physical activity. ? Eating a healthy diet. ? Avoiding tobacco and drug use. ? Limiting alcohol use. ? Practicing safe sex. ? Taking low doses of aspirin every day. ? Taking vitamin and mineral supplements as recommended by your health care provider. What happens during an annual well check? The services and screenings done by your health care provider during your annual well check will depend on your age, overall health, lifestyle risk factors, and family history of disease. Counseling Your health care provider may ask you questions about your:  Alcohol use.  Tobacco use.  Drug use.  Emotional well-being.  Home and relationship well-being.  Sexual activity.  Eating habits.  History of falls.  Memory and ability to understand (cognition).  Work and work Statistician.  Screening You may have the following tests or measurements:  Height, weight, and BMI.  Blood pressure.  Lipid and cholesterol levels. These may be checked every 5 years, or more frequently if you are over 26 years  old.  Skin check.  Lung cancer screening. You may have this screening every year starting at age 87 if you have a 30-pack-year history of smoking and currently smoke or have quit within the past 15 years.  Fecal occult blood test (FOBT) of the stool. You may have this test every year starting at age 63.  Flexible sigmoidoscopy or colonoscopy. You may have a sigmoidoscopy every 5 years or a colonoscopy every 10 years starting at age 42.  Prostate cancer screening. Recommendations will vary depending on your family history and other risks.  Hepatitis C blood test.  Hepatitis B blood test.  Sexually transmitted disease (STD) testing.  Diabetes screening. This is done by checking your blood sugar (glucose) after you have not eaten for a while (fasting). You may have this done every 1-3 years.  Abdominal aortic aneurysm (AAA) screening. You may need this if you are a current or former smoker.  Osteoporosis. You may be screened starting at age 41 if you are at high risk.  Talk with your health care provider about your test results, treatment options, and if necessary, the need for more tests. Vaccines Your health care provider may recommend certain vaccines, such as:  Influenza vaccine. This is recommended every year.  Tetanus, diphtheria, and acellular pertussis (Tdap, Td) vaccine. You may need a Td booster every 10 years.  Varicella vaccine. You may need this if you have not been vaccinated.  Zoster vaccine. You may need this after age 73.  Measles, mumps, and rubella (MMR) vaccine. You may need at least one dose of MMR if you were born in 1957 or  later. You may also need a second dose.  Pneumococcal 13-valent conjugate (PCV13) vaccine. One dose is recommended after age 53.  Pneumococcal polysaccharide (PPSV23) vaccine. One dose is recommended after age 64.  Meningococcal vaccine. You may need this if you have certain conditions.  Hepatitis A vaccine. You may need this if you  have certain conditions or if you travel or work in places where you may be exposed to hepatitis A.  Hepatitis B vaccine. You may need this if you have certain conditions or if you travel or work in places where you may be exposed to hepatitis B.  Haemophilus influenzae type b (Hib) vaccine. You may need this if you have certain risk factors.  Talk to your health care provider about which screenings and vaccines you need and how often you need them. This information is not intended to replace advice given to you by your health care provider. Make sure you discuss any questions you have with your health care provider. Document Released: 01/12/2016 Document Revised: 09/04/2016 Document Reviewed: 10/17/2015 Elsevier Interactive Patient Education  [employer].
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
## 2018-10-29 ENCOUNTER — Ambulatory Visit: Payer: Self-pay

## 2018-10-29 ENCOUNTER — Other Ambulatory Visit: Payer: Self-pay | Admitting: *Deleted

## 2018-10-29 NOTE — Patient Outreach (Signed)
[facility] [facility]) Care Management  10/29/2018  Caster Fayette Jul 05, 1933 [id number]  TELEPHONE SCREENING Referral date: 10/16/2018 Referral source: MD Referral Referral reason:  Medication Assistance Insurance: United [employer] Attempt: #2  Telephone call to patient regarding MD referral. Unsuccessful phone call. HIPAA approved message left with phone number to call back.     PLAN: RN CM will try to reach patient again in four business days.   Lanae Boast RN BSN [facility], molecular Dial:  [phone] Fax: [phone] [address],   [place],  [place]  [address]  Website:  [email or web address]

## 2018-11-03 ENCOUNTER — Other Ambulatory Visit: Payer: Self-pay

## 2018-11-03 ENCOUNTER — Telehealth: Payer: Self-pay | Admitting: Family

## 2018-11-03 NOTE — Progress Notes (Signed)
Left detailed message for patient.  Please call to discuss getting help with your medications.
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
## 2018-11-03 NOTE — Telephone Encounter (Signed)
Attempted to contact patient - NA °

## 2018-11-03 NOTE — Telephone Encounter (Signed)
Patient called stating then when he talked to [place], RN today states that he did not tell her that he denied her services. Patient states that he has just been sick and wants to get better first.  Would like for Janie to call patient so he can explain to her.

## 2018-11-03 NOTE — Patient Outreach (Signed)
[facility] [facility]) Care Management  11/03/2018  [name] 07/19/1933 [id number]   TELEPHONE SCREENING Referral date: 10/16/2018 Referral source: MD Referral Referral reason:  Medication Assistance Insurance: United [employer] Attempt #3  Telephone call to patient regarding primary MD referral. Attempted home phone number.  HIPAA compliant message left with call back phone number. Attempted alternate number. Contact states patient does not live there.   Plan:  If no return call will proceed with closure.   Quinn Plowman RN,BSN,CCM [facility] Telephonic  [phone]

## 2018-11-03 NOTE — Patient Outreach (Signed)
[place] [facility]) Care Management  11/03/2018  Shahin Knierim 07/21/1933 [id number]  TELEPHONE SCREENING Referral date:10/16/2018 Referral source:MD Referral Referral reason:Medication [place] incoming call from patient.  HIPAA verified. Explained reason for call.  Patient states he sometimes, " runs short" on his medications because of his social security check.  Patient states he is having difficulty affording Novolog and Toujeo.  RNCM discussed and offered [facility] care management services.  Patient declined.  RNCM offered to send patient [facility] care management brochure/ magnet. Patient verbally agreed.   PLAN; RNCM will close patient due to refusal of services. RNCm will send patient [facility] care management brochure/ magnet as discussed  RNCM will send closure letter to patients primary MD  Quinn Plowman RN,BSN,CCM [facility] Telephonic  [phone]

## 2018-11-03 NOTE — Telephone Encounter (Signed)
Patient agrees to speak with Ms Nyoka Cowden  and discuss getting help with medications.  He says he is unwell but never said that he did not want help when she called earlier.  Ms Nyoka Cowden was notified and will contact patient again.

## 2018-11-04 ENCOUNTER — Other Ambulatory Visit: Payer: Self-pay | Admitting: Family

## 2018-11-05 ENCOUNTER — Other Ambulatory Visit: Payer: Self-pay | Admitting: *Deleted

## 2018-11-05 ENCOUNTER — Other Ambulatory Visit: Payer: Self-pay

## 2018-11-05 NOTE — Patient Outreach (Addendum)
[facility]) Care Management  [place]  11/05/2018  [name] Apr 30, 1933 [id number]   Reason for referral: medication assistance for insulins.  Unsuccessful telephone call attempt # 1 to patient.   Voice message did not pick up.  Attempted to call alternate number, but lady that answered said she was not [place].    Plan:  I will make another outreach attempt to patient within 3-4 business days.  Will send unsuccessful outreach attempt letter.  Will ask [facility] RN, Quinn Plowman to let patient know I am trying to reach him next time she outreaches to him.  Joetta Manners, PharmD Clinical Pharmacist [place] [phone]

## 2018-11-05 NOTE — Patient Outreach (Signed)
Referral received from telephonic RN care manager to assess for further needs, telephone call to pt at [phone], no answer to telephone and no option to leave voicemail. RN CM mailed unsuccessful outreach letter to pt home.  PLAN Outreach pt in 3-4 business days  Jacqlyn Larsen [facility], [place] [phone]

## 2018-11-08 ENCOUNTER — Other Ambulatory Visit: Payer: Self-pay | Admitting: Family

## 2018-11-10 ENCOUNTER — Other Ambulatory Visit: Payer: Self-pay | Admitting: *Deleted

## 2018-11-10 ENCOUNTER — Other Ambulatory Visit: Payer: Self-pay

## 2018-11-10 NOTE — Patient Outreach (Signed)
Telephone call (2nd attempt) for screening, no answer to telephone and received a series of beeps, unable to leave message.    PLAN Outreach in 3-4 business days  Jacqlyn Larsen [facility], [place] [phone]

## 2018-11-10 NOTE — Patient Outreach (Signed)
[place] [facility]) Care Management  [place]  11/10/2018  [name] [birth year] [id number]   Reason for referral: medication assitance  Unsuccessful telephone call attempt # 2 to patient.   Unable to leave message  Plan:  I will make another outreach attempt to patient within 3-4 business days  Joetta Manners, [place] [phone]

## 2018-11-11 ENCOUNTER — Telehealth: Payer: Self-pay | Admitting: Family

## 2018-11-11 ENCOUNTER — Ambulatory Visit: Payer: Self-pay

## 2018-11-12 ENCOUNTER — Other Ambulatory Visit: Payer: Self-pay

## 2018-11-12 ENCOUNTER — Ambulatory Visit: Payer: Medicare Other | Admitting: Physical Therapy

## 2018-11-12 ENCOUNTER — Other Ambulatory Visit: Payer: Self-pay | Admitting: Family

## 2018-11-12 NOTE — Patient Outreach (Signed)
[name] [name]) Care Management  [place]   11/12/2018  [name] 10/29/1933 [id number]  Reason for referral: medication assistance for insulin  Referral source: PCP Referral medication(s): insulin Current insurance: [employer]  PMHx:  Hypertension, GERD, type 2 diabetes mellitus, hyperlipidemia and benign prostatic  hyperplasia.  HPI: [name] reports financial hardship from past hospital bills. He states that he is not able to afford his insulins and is not about to confidently tell me what he is taking.  He states that he occasionally checks his glucose, but would not tell me what his glucose normally runs.    Objective: Allergies  Allergen Reactions  . Diclofenac   . Flexeril [Cyclobenzaprine]   . Guaifenesin Er     Believes that it makes him "black out"    Medications Reviewed Today    Reviewed by [name], [facility] (Pharmacist) on 11/12/18 at 1357  Med List Status: <None>  Medication Order Taking? Sig Documenting Provider Last Dose Status Informant  amLODipine (NORVASC) 10 MG tablet [id number] Yes TAKE 1 TABLET BY MOUTH EVERY DAY Dale Balloon, FNP Taking Active   aspirin 81 MG tablet [id number] Yes Take 81 mg by mouth daily. Takes EC aspirin [provider] Taking Active Self  atorvastatin (LIPITOR) 20 MG tablet [id number] Yes Take 1 tablet (20 mg total) by mouth daily. Dale Balloon, FNP Taking Active   baclofen (LIORESAL) 10 MG tablet [id number] Yes TAKE 1 TABLET THREE TIMES A DAY [name], Dale A, FNP Taking Active   diazepam (VALIUM) 5 MG tablet [id number] Yes TAKE 1 TABLET 2 TIMES DAILY AS NEEDED [name] A, FNP Taking Active   finasteride (PROSCAR) 5 MG tablet [id number] Yes TAKE 1 TABLET (5 MG TOTAL) BY MOUTH AT BEDTIME. FOR PROSTATE Dale Balloon, FNP Taking Active   furosemide (LASIX) 20 MG tablet [id number] Yes TAKE 1 OR 2 TABLETS (20-40 MG TOTAL) BY MOUTH DAILY.  Patient taking differently:  Takes 2  tablets every morning.   Dale Balloon, FNP Taking Active   glucose blood (ONE TOUCH ULTRA TEST) test strip [id number] Yes USE FOR TESTING TWICE A DAY [name], Dale A, FNP Taking Active   insulin aspart (NOVOLOG FLEXPEN) 100 UNIT/ML FlexPen [id number] No Inject 20 Units into the skin daily with breakfast. [name], PharmD Unknown Active            Med Note [name], [name]   Tue Feb 17, 2018 11:30 AM) No money to purchase.  Insulin Glargine (LANTUS SOLOSTAR) 100 UNIT/ML Solostar Pen [id number] No Inject 8 Units into the skin daily. [name] A, FNP Unknown Active   losartan (COZAAR) 100 MG tablet [id number] No Take 1 tablet (100 mg total) by mouth daily.  Patient not taking:  Reported on 11/12/2018   Dale Balloon, FNP Not Taking Active   meloxicam (MOBIC) 7.5 MG tablet [id number] Yes TAKE 1 TABLET BY MOUTH EVERY DAY [name], Dale A, FNP Taking Active   metFORMIN (GLUCOPHAGE-XR) 500 MG 24 hr tablet [id number] Yes Take 3 tablets (1,500 mg total) by mouth daily with breakfast.  Patient taking differently:  Take 1,500 mg by mouth daily with breakfast. Takes 1 tablet three times daily with meals.   Dale Balloon, FNP Taking Active   [facility] DELICA LANCETS 66Z MISC [id number] Yes 1 STICK BY DOES NOT APPLY ROUTE 2 (TWO) TIMES DAILY. [name] A, FNP Taking Active   tamsulosin (FLOMAX) 0.4 MG CAPS capsule [id number] Yes TAKE 2 CAPSULES (0.8  MG TOTAL) BY MOUTH AT BEDTIME. FOR PROSTATE. Dale Balloon, FNP Taking Active   ULTICARE MICRO PEN NEEDLES 32G X 4 MM MISC [id number] Yes USE AS DIRECTED BY MD Dale Balloon, FNP Taking Active   ULTICARE MICRO PEN NEEDLES 32G X 4 MM MISC [id number] Yes USE AS DIRECTED BY MD Dale Balloon, FNP Taking Active   Vitamin D, Ergocalciferol, (DRISDOL) 50000 units CAPS capsule [id number] Yes TAKE ONE CAPSULE ONCE A WEEK  Patient taking differently:  Takes on Sunday   [name] A, [place] Taking Active Self          Assessment:  Drugs sorted  by system:  Neurologic/Psychologic: diazepam  Cardiovascular: amlodipine, atorvastatin, aspirin 81 mg, furosemide  Endocrine: metformin  Pain: meloxicam  Genitourinary: finasteride, tamsulosin  Vitamins/Minerals/Supplements: ergocalciferol  Miscellaneous: baclofen  Medication Review Findings:  . Patient could not tell me if he was taking Lantus OR Toujeo or his exact dose.  He said he was told to use 8 units, but sometimes he uses 12-15 units if his glucose is running high.  . He did not know if he was supposed to be on Novolog/dose/frequency. [name] He is taking Metformin 500 mg XL TID with meals instead of 1500 mg with breakfast as prescribed.  . He has losartan on Epic med list, but does not have in home and is unsure if he is supposed to take this medication.   Medication Assistance Findings:  Extra Help:   []  Already receiving Full Extra Help  []  Already receiving Partial Extra Help  []  Eligible based on reported income and assets  [x]  Not Eligible based on reported income and assets  Patient Assistance Programs: 1) Toujeo OR Lantus made by [employer] o Income requirement met: [x]  Yes []  No []  Unknown o Out-of-pocket prescription expenditure met:    []  Yes []  No  [x]  Unknown  []  Not applicable - Patient states he has not spent 5% of his income.         2)  Novolog made by [place] requirement met: [x]  Yes []  No  []  Unknown o Out-of-pocket prescription expenditure met:   []  Yes [x]  No   []  Unknown []  Not applicable - Patient states he has not spent $1000 out of pocket on prescriptions.    Additional medication assistance options reviewed with patient as warranted:  No other options identified  Plan: I have outreached to Dale Dun, FNP to determine his insulin regimen and if he is prescribed losartan.  Referral to social work to see if any community services available to him.  [name], PharmD Clinical Pharmacist [place] [phone]

## 2018-11-12 NOTE — Telephone Encounter (Signed)
Patient aware,per voice mail, sample is ready.

## 2018-11-13 ENCOUNTER — Other Ambulatory Visit: Payer: Self-pay | Admitting: *Deleted

## 2018-11-13 NOTE — Patient Outreach (Signed)
RN CM called pt at [phone] with no answer to telephone and no option to leave voicemail, [phone] no answer and no option to leave voicemail,  RN CM sent in basket to [facility] pharmacist Joetta Manners informing RN CM continues to have issues getting in touch with pt.  PLAN Close case in 3-4 days  Jacqlyn Larsen [facility], [place] Coordinator [phone]

## 2018-11-16 ENCOUNTER — Other Ambulatory Visit: Payer: Self-pay

## 2018-11-16 ENCOUNTER — Ambulatory Visit: Payer: Self-pay

## 2018-11-16 NOTE — Patient Outreach (Signed)
[place] [facility]) Care Management  [place]  11/16/2018  [name] [birth year] [id number]  Reason for referral: medication assistance for insulin  Referral source: PCP Referral medication(s): insulin Current insurance: [employer]  Unsuccessful telephone call attempt # 1 to patient.   Unable to leave message.  In-basket message received from [facility], FNP to clarify his insulin regimen: Novolog 20 units SQ with breakfast.  (Not able to afford.) Toujeo 8 units SQ daily.  (He received office sample.) Patient could change from Novolog to regular insulin - Relion brand that is $25/vial.  Still unsure if he can afford.   Plan:  I will make another outreach attempt to patient within 3-4 business days to discuss insulin plan.  He is not eligible for medication assistance.   Joetta Manners, PharmD Clinical Pharmacist [place] [phone]

## 2018-11-16 NOTE — Patient Outreach (Signed)
[place] [facility]) Care Management  11/16/2018  [name] [birth year] [id number]   Incoming call received from [name].  HIPAA identifiers verified.   Informed [name] that he is not eligible for patient assistance because he has not spent the out of pocket requirement on prescriptions and he makes too much to apply for Extra Help LIS.   He states the he does not have enough money to buy insulin for $25/vial at [facility].   HgA1c 8.8% on 10/22/18  Informed [name] the [facility] SW and RN are going to contact him.  He wrote down their names.    [name] reports that he has no medication questions or concerns at this time. Informed him that I will close his [place] case.  He has my phone number and is aware that he can call me in the future should medication issues arise.    Plan: Route discipline closure letter to PCP, [name], FNP.  Joetta Manners, PharmD Clinical Pharmacist [place] [phone]

## 2018-11-17 ENCOUNTER — Other Ambulatory Visit: Payer: Self-pay

## 2018-11-17 NOTE — Patient Outreach (Signed)
[place] [facility]) Care Management  11/17/2018  [name] 1933/05/24 [id number]   Initial outreach to [name] regarding social work referral for financial resources.  Referral was made by [facility] Pharmacist, Joetta Manners.  Per Jennifer's note on 11/16/18 "[name] is not eligible for patient assistance because he has not spent the out of pocket requirement on prescriptions and he makes too much to apply for Extra Help LIS.   He states the he does not have enough money to buy insulin for $25/vial at [facility] and [name] discussed his financial challenges and resources that he currently utilizes.  [name] reported that he gets food from a church in [place] every Friday.  BSW offered to provide him with additional food resources but [name] declined.  BSW inquired about his monthly income and whether or not [name] has ever applied for Medicaid.  Unfortunately, he is significantly over the income limit to qualify.  BSW suggested contacting the Financial Counseling Department with [facility] as well as [facility] as he reported that medical bills are a source of financial strain.  BSW also suggested contacting [employer] [facility] in [place] and informed him that they have financial guides that can assist with dept repayment or budget planning.   [name] declined every financial resource offered/suggested saying "I have reached out to everybody".   BSW is closing case at this time due to [name] declining offered resources.   [name], BSW Social Worker [phone]

## 2018-11-18 ENCOUNTER — Other Ambulatory Visit: Payer: Self-pay | Admitting: *Deleted

## 2018-11-18 NOTE — Patient Outreach (Signed)
RN CM is unable to maintain contact with pt,  Pt has returned phone calls on occasion several days later and leaves voicemail but RN CM still unable to get in touch with pt.  RN CM faxed case closure letter to primary [name] FNP and mailed case closure letter to pt home.  Case closed  Jacqlyn Larsen [facility], [place] Coordinator [phone]

## 2018-11-20 ENCOUNTER — Ambulatory Visit: Payer: Self-pay

## 2018-11-23 ENCOUNTER — Telehealth: Payer: Self-pay | Admitting: Family

## 2018-11-23 NOTE — Telephone Encounter (Signed)
Ok

## 2018-11-23 NOTE — Telephone Encounter (Signed)
Spoke with pharmacist and they said pt had called them asking whether he needed to be on losartan, Irbesartan and amlodipine. Advised pharmacist pt should be on losartan 100mg  and Amlodipine 10mg  according to his current med list. Pt should not be on both Irbesartan and losartan at the same time. Pharmacist states she will notify pt and update there records.

## 2018-12-01 ENCOUNTER — Other Ambulatory Visit: Payer: Self-pay | Admitting: Family

## 2018-12-08 ENCOUNTER — Other Ambulatory Visit: Payer: Self-pay | Admitting: Family

## 2018-12-10 ENCOUNTER — Other Ambulatory Visit: Payer: Self-pay | Admitting: Family

## 2018-12-28 ENCOUNTER — Other Ambulatory Visit: Payer: Self-pay | Admitting: Family

## 2018-12-28 NOTE — Telephone Encounter (Signed)
Last Vit D 08/14/17  27.9  Christy

## 2018-12-30 ENCOUNTER — Other Ambulatory Visit: Payer: Self-pay | Admitting: Family

## 2019-01-05 ENCOUNTER — Other Ambulatory Visit: Payer: Self-pay | Admitting: Family

## 2019-01-05 NOTE — Telephone Encounter (Signed)
Ov 01/18/19

## 2019-01-07 ENCOUNTER — Other Ambulatory Visit: Payer: Self-pay | Admitting: Family

## 2019-01-07 DIAGNOSIS — I10 Essential (primary) hypertension: Secondary | ICD-10-CM

## 2019-01-07 DIAGNOSIS — E1142 Type 2 diabetes mellitus with diabetic polyneuropathy: Secondary | ICD-10-CM | POA: Diagnosis not present

## 2019-01-07 DIAGNOSIS — R609 Edema, unspecified: Secondary | ICD-10-CM

## 2019-01-07 DIAGNOSIS — L84 Corns and callosities: Secondary | ICD-10-CM | POA: Diagnosis not present

## 2019-01-07 DIAGNOSIS — B351 Tinea unguium: Secondary | ICD-10-CM | POA: Diagnosis not present

## 2019-01-07 DIAGNOSIS — M79676 Pain in unspecified toe(s): Secondary | ICD-10-CM | POA: Diagnosis not present

## 2019-01-18 ENCOUNTER — Encounter: Payer: Self-pay | Admitting: Family

## 2019-01-18 ENCOUNTER — Ambulatory Visit (INDEPENDENT_AMBULATORY_CARE_PROVIDER_SITE_OTHER): Payer: Medicare Other | Admitting: Family

## 2019-01-18 ENCOUNTER — Telehealth: Payer: Self-pay | Admitting: Family

## 2019-01-18 VITALS — BP 159/80 | HR 110 | Temp 97.1°F | Ht 68.0 in | Wt 232.6 lb

## 2019-01-18 DIAGNOSIS — E785 Hyperlipidemia, unspecified: Secondary | ICD-10-CM

## 2019-01-18 DIAGNOSIS — M545 Low back pain, unspecified: Secondary | ICD-10-CM

## 2019-01-18 DIAGNOSIS — E119 Type 2 diabetes mellitus without complications: Secondary | ICD-10-CM

## 2019-01-18 DIAGNOSIS — E1159 Type 2 diabetes mellitus with other circulatory complications: Secondary | ICD-10-CM | POA: Diagnosis not present

## 2019-01-18 DIAGNOSIS — R351 Nocturia: Secondary | ICD-10-CM

## 2019-01-18 DIAGNOSIS — F419 Anxiety disorder, unspecified: Secondary | ICD-10-CM

## 2019-01-18 DIAGNOSIS — Z79899 Other long term (current) drug therapy: Secondary | ICD-10-CM

## 2019-01-18 DIAGNOSIS — E1169 Type 2 diabetes mellitus with other specified complication: Secondary | ICD-10-CM

## 2019-01-18 DIAGNOSIS — I152 Hypertension secondary to endocrine disorders: Secondary | ICD-10-CM

## 2019-01-18 DIAGNOSIS — I10 Essential (primary) hypertension: Secondary | ICD-10-CM | POA: Diagnosis not present

## 2019-01-18 DIAGNOSIS — N401 Enlarged prostate with lower urinary tract symptoms: Secondary | ICD-10-CM

## 2019-01-18 DIAGNOSIS — K219 Gastro-esophageal reflux disease without esophagitis: Secondary | ICD-10-CM | POA: Diagnosis not present

## 2019-01-18 DIAGNOSIS — Z794 Long term (current) use of insulin: Secondary | ICD-10-CM | POA: Diagnosis not present

## 2019-01-18 DIAGNOSIS — F1320 Sedative, hypnotic or anxiolytic dependence, uncomplicated: Secondary | ICD-10-CM

## 2019-01-18 DIAGNOSIS — G8929 Other chronic pain: Secondary | ICD-10-CM

## 2019-01-18 LAB — BAYER DCA HB A1C WAIVED: HB A1C (BAYER DCA - WAIVED): 10.1 % — ABNORMAL HIGH (ref <7.0)

## 2019-01-18 MED ORDER — DIAZEPAM 5 MG PO TABS: ORAL_TABLET | ORAL | 5 refills | Status: DC

## 2019-01-18 MED ORDER — DULOXETINE HCL 30 MG PO CPEP: 30.0000 mg | ORAL_CAPSULE | Freq: Every day | ORAL | 3 refills | Status: DC
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
## 2019-01-18 NOTE — Telephone Encounter (Signed)
Left message, if it does not say take with food on script bottle , he may take before eating.

## 2019-01-18 NOTE — Patient Instructions (Signed)
Diabetes Mellitus and Nutrition, Adult  When you have diabetes (diabetes mellitus), it is very important to have healthy eating habits because your blood sugar (glucose) levels are greatly affected by what you eat and drink. Eating healthy foods in the appropriate amounts, at about the same times every day, can help you:  · Control your blood glucose.  · Lower your risk of heart disease.  · Improve your blood pressure.  · Reach or maintain a healthy weight.  Every person with diabetes is different, and each person has different needs for a meal plan. Your health care provider may recommend that you work with a diet and nutrition specialist (dietitian) to make a meal plan that is best for you. Your meal plan may vary depending on factors such as:  · The calories you need.  · The medicines you take.  · Your weight.  · Your blood glucose, blood pressure, and cholesterol levels.  · Your activity level.  · Other health conditions you have, such as heart or kidney disease.  How do carbohydrates affect me?  Carbohydrates, also called carbs, affect your blood glucose level more than any other type of food. Eating carbs naturally raises the amount of glucose in your blood. Carb counting is a method for keeping track of how many carbs you eat. Counting carbs is important to keep your blood glucose at a healthy level, especially if you use insulin or take certain oral diabetes medicines.  It is important to know how many carbs you can safely have in each meal. This is different for every person. Your dietitian can help you calculate how many carbs you should have at each meal and for each snack.  Foods that contain carbs include:  · Bread, cereal, rice, pasta, and crackers.  · Potatoes and corn.  · Peas, beans, and lentils.  · Milk and yogurt.  · Fruit and juice.  · Desserts, such as cakes, cookies, ice cream, and candy.  How does alcohol affect me?  Alcohol can cause a sudden decrease in blood glucose (hypoglycemia),  especially if you use insulin or take certain oral diabetes medicines. Hypoglycemia can be a life-threatening condition. Symptoms of hypoglycemia (sleepiness, dizziness, and confusion) are similar to symptoms of having too much alcohol.  If your health care provider says that alcohol is safe for you, follow these guidelines:  · Limit alcohol intake to no more than 1 drink per day for nonpregnant women and 2 drinks per day for men. One drink equals 12 oz of beer, 5 oz of wine, or 1½ oz of hard liquor.  · Do not drink on an empty stomach.  · Keep yourself hydrated with water, diet soda, or unsweetened iced tea.  · Keep in mind that regular soda, juice, and other mixers may contain a lot of sugar and must be counted as carbs.  What are tips for following this plan?    Reading food labels  · Start by checking the serving size on the "Nutrition Facts" label of packaged foods and drinks. The amount of calories, carbs, fats, and other nutrients listed on the label is based on one serving of the item. Many items contain more than one serving per package.  · Check the total grams (g) of carbs in one serving. You can calculate the number of servings of carbs in one serving by dividing the total carbs by 15. For example, if a food has 30 g of total carbs, it would be equal to 2   servings of carbs.  · Check the number of grams (g) of saturated and trans fats in one serving. Choose foods that have low or no amount of these fats.  · Check the number of milligrams (mg) of salt (sodium) in one serving. Most people should limit total sodium intake to less than 2,300 mg per day.  · Always check the nutrition information of foods labeled as "low-fat" or "nonfat". These foods may be higher in added sugar or refined carbs and should be avoided.  · Talk to your dietitian to identify your daily goals for nutrients listed on the label.  Shopping  · Avoid buying canned, premade, or processed foods. These foods tend to be high in fat, sodium,  and added sugar.  · Shop around the outside edge of the grocery store. This includes fresh fruits and vegetables, bulk grains, fresh meats, and fresh dairy.  Cooking  · Use low-heat cooking methods, such as baking, instead of high-heat cooking methods like deep frying.  · Cook using healthy oils, such as olive, canola, or sunflower oil.  · Avoid cooking with butter, cream, or high-fat meats.  Meal planning  · Eat meals and snacks regularly, preferably at the same times every day. Avoid going long periods of time without eating.  · Eat foods high in fiber, such as fresh fruits, vegetables, beans, and whole grains. Talk to your dietitian about how many servings of carbs you can eat at each meal.  · Eat 4-6 ounces (oz) of lean protein each day, such as lean meat, chicken, fish, eggs, or tofu. One oz of lean protein is equal to:  ? 1 oz of meat, chicken, or fish.  ? 1 egg.  ? ¼ cup of tofu.  · Eat some foods each day that contain healthy fats, such as avocado, nuts, seeds, and fish.  Lifestyle  · Check your blood glucose regularly.  · Exercise regularly as told by your health care provider. This may include:  ? 150 minutes of moderate-intensity or vigorous-intensity exercise each week. This could be brisk walking, biking, or water aerobics.  ? Stretching and doing strength exercises, such as yoga or weightlifting, at least 2 times a week.  · Take medicines as told by your health care provider.  · Do not use any products that contain nicotine or tobacco, such as cigarettes and e-cigarettes. If you need help quitting, ask your health care provider.  · Work with a counselor or diabetes educator to identify strategies to manage stress and any emotional and social challenges.  Questions to ask a health care provider  · Do I need to meet with a diabetes educator?  · Do I need to meet with a dietitian?  · What number can I call if I have questions?  · When are the best times to check my blood glucose?  Where to find more  information:  · American Diabetes Association: diabetes.org  · Academy of Nutrition and Dietetics: www.eatright.org  · National Institute of Diabetes and Digestive and Kidney Diseases (NIH): www.niddk.nih.gov  Summary  · A healthy meal plan will help you control your blood glucose and maintain a healthy lifestyle.  · Working with a diet and nutrition specialist (dietitian) can help you make a meal plan that is best for you.  · Keep in mind that carbohydrates (carbs) and alcohol have immediate effects on your blood glucose levels. It is important to count carbs and to use alcohol carefully.  This information is not intended to   replace advice given to you by your health care provider. Make sure you discuss any questions you have with your health care provider.  Document Released: 09/12/2005 Document Revised: 07/16/2017 Document Reviewed: 01/20/2017  Elsevier Interactive Patient Education © 2019 Elsevier Inc.

## 2019-01-19 LAB — CMP14+EGFR
A/G RATIO: 1.9 (ref 1.2–2.2)
ALT: 47 IU/L — AB (ref 0–44)
AST: 45 IU/L — AB (ref 0–40)
Albumin: 5.2 g/dL — ABNORMAL HIGH (ref 3.6–4.6)
Alkaline Phosphatase: 93 IU/L (ref 39–117)
BUN/Creatinine Ratio: 8 — ABNORMAL LOW (ref 10–24)
BUN: 8 mg/dL (ref 8–27)
Bilirubin Total: 0.3 mg/dL (ref 0.0–1.2)
CALCIUM: 10.7 mg/dL — AB (ref 8.6–10.2)
CO2: 20 mmol/L (ref 20–29)
Chloride: 98 mmol/L (ref 96–106)
Creatinine, Ser: 1.01 mg/dL (ref 0.76–1.27)
GFR calc Af Amer: 79 mL/min/{1.73_m2} (ref >59)
GFR, EST NON AFRICAN AMERICAN: 68 mL/min/{1.73_m2} (ref >59)
Globulin, Total: 2.8 g/dL (ref 1.5–4.5)
Glucose: 222 mg/dL — ABNORMAL HIGH (ref 65–99)
POTASSIUM: 4.3 mmol/L (ref 3.5–5.2)
Sodium: 142 mmol/L (ref 134–144)
Total Protein: 8 g/dL (ref 6.0–8.5)

## 2019-01-19 LAB — CBC WITH DIFFERENTIAL/PLATELET
Basophils Absolute: 0.1 10*3/uL (ref 0.0–0.2)
Basos: 1 %
EOS (ABSOLUTE): 0.1 10*3/uL (ref 0.0–0.4)
EOS: 1 %
Hematocrit: 43.3 % (ref 37.5–51.0)
Hemoglobin: 15.1 g/dL (ref 13.0–17.7)
IMMATURE GRANULOCYTES: 0 %
Immature Grans (Abs): 0 10*3/uL (ref 0.0–0.1)
Lymphocytes Absolute: 2.1 10*3/uL (ref 0.7–3.1)
Lymphs: 23 %
MCH: 30.3 pg (ref 26.6–33.0)
MCHC: 34.9 g/dL (ref 31.5–35.7)
MCV: 87 fL (ref 79–97)
Monocytes Absolute: 0.6 10*3/uL (ref 0.1–0.9)
Monocytes: 7 %
Neutrophils Absolute: 6.1 10*3/uL (ref 1.4–7.0)
Neutrophils: 68 %
Platelets: 304 10*3/uL (ref 150–450)
RBC: 4.98 x10E6/uL (ref 4.14–5.80)
RDW: 13.5 % (ref 11.6–15.4)
WBC: 9 10*3/uL (ref 3.4–10.8)

## 2019-01-19 LAB — LIPID PANEL
Chol/HDL Ratio: 3 ratio (ref 0.0–5.0)
Cholesterol, Total: 136 mg/dL (ref 100–199)
HDL: 45 mg/dL (ref >39)
LDL Calculated: 67 mg/dL (ref 0–99)
TRIGLYCERIDES: 120 mg/dL (ref 0–149)
VLDL Cholesterol Cal: 24 mg/dL (ref 5–40)

## 2019-01-19 LAB — MICROALBUMIN / CREATININE URINE RATIO
Creatinine, Urine: 12.7 mg/dL
Microalb/Creat Ratio: 151 mg/g creat — ABNORMAL HIGH (ref 0–29)
Microalbumin, Urine: 19.2 ug/mL

## 2019-01-22 ENCOUNTER — Telehealth: Payer: Self-pay

## 2019-01-25 ENCOUNTER — Telehealth: Payer: Self-pay | Admitting: Family

## 2019-01-25 NOTE — Telephone Encounter (Signed)
Aware, per voice mail message,  samples available.

## 2019-02-05 ENCOUNTER — Telehealth: Payer: Medicare Other | Admitting: *Deleted

## 2019-02-07 ENCOUNTER — Other Ambulatory Visit: Payer: Self-pay | Admitting: Family

## 2019-02-08 ENCOUNTER — Telehealth: Payer: Medicare Other | Admitting: *Deleted

## 2019-02-09 ENCOUNTER — Other Ambulatory Visit: Payer: Self-pay | Admitting: Family

## 2019-02-18 ENCOUNTER — Ambulatory Visit (INDEPENDENT_AMBULATORY_CARE_PROVIDER_SITE_OTHER): Payer: Medicare Other

## 2019-02-18 ENCOUNTER — Ambulatory Visit: Payer: Self-pay | Admitting: Licensed Clinical Social Worker

## 2019-02-18 DIAGNOSIS — I10 Essential (primary) hypertension: Principal | ICD-10-CM

## 2019-02-18 DIAGNOSIS — E119 Type 2 diabetes mellitus without complications: Secondary | ICD-10-CM

## 2019-02-18 DIAGNOSIS — F419 Anxiety disorder, unspecified: Secondary | ICD-10-CM

## 2019-02-18 DIAGNOSIS — Z794 Long term (current) use of insulin: Secondary | ICD-10-CM

## 2019-02-18 DIAGNOSIS — I152 Hypertension secondary to endocrine disorders: Secondary | ICD-10-CM

## 2019-02-18 DIAGNOSIS — E1159 Type 2 diabetes mellitus with other circulatory complications: Secondary | ICD-10-CM

## 2019-02-18 DIAGNOSIS — K219 Gastro-esophageal reflux disease without esophagitis: Secondary | ICD-10-CM

## 2019-02-18 NOTE — Patient Instructions (Signed)
Licensed Clinical Social Worker Visit Information  Materials Provided:  No  [name] was given information about Chronic Care Management services today including:  1. CCM service includes personalized support from designated clinical staff supervised by his physician, including individualized plan of care and coordination with other care providers 2. 24/7 contact phone numbers for assistance for urgent and routine care needs. 3. Service will only be billed when office clinical staff spend 20 minutes or more in a month to coordinate care. 4. Only one practitioner may furnish and bill the service in a calendar month. 5. The patient may stop CCM services at any time (effective at the end of the month) by phone call to the office staff. 6. The patient will be responsible for cost sharing (co-pay) of up to 20% of the service fee (after annual deductible is met).  Patient agreed to services and verbal consent obtained.    Current Barriers:   Financial constraints  Mental Health Concerns   Social Isolation  Goal:  Patient Stated:  " I want to work on my anxiety symptoms and managing anxiety"  Clinical Social Work Clinical Goal(s):  Over the next 30 days days, client will work with SW to address concerns related to anxiety issues of client and managing anxiety issues of client  Interventions:  Patient interviewed and appropriate assessments performed  Provided patient with information about CCM program services provision  Provided counseling support for client    Encouraged client to talk with RN CM [name] related to nursing needs of client.  Patient Self Care Activities:   Attends all scheduled provider appointments  Performs ADL's independently  Self administers medications as prescribed  Plan:   LCSW to call client in 2 weeks to discuss anxiety symptoms of clienet and management of anxiety symptoms  Patient will communicate with LCSW to discuss psychosocial  needs of client  Client to communicate with RN CM to discuss nursing needs of client  Client to attend scheduled medical appointments  *initial goal documentation    Follow Up Plan: LCSW to call client in 2 weeks to talk about anxiety symptoms management for client.   The patient verbalized understanding of instructions provided today and declined a print copy of patient instruction materials.    [name].Antiono Ettinger MSW, LCSW Licensed Clinical Social Worker [facility] Care Management [phone]

## 2019-02-18 NOTE — Chronic Care Management (AMB) (Addendum)
Chronic Care Management    Clinical Social Work General Note  02/18/2019 Name: [name] MRN: [id number] DOB: [birth year]   Referred by: PCP, Junie Spencer, FNP for psychosocial assessment  [name] was given information about Chronic Care Management services today including:  1. CCM service includes personalized support from designated clinical staff supervised by his physician, including individualized plan of care and coordination with other care providers 2. 24/7 contact phone numbers for assistance for urgent and routine care needs. 3. Service will only be billed when office clinical staff spend 20 minutes or more in a month to coordinate care. 4. Only one practitioner may furnish and bill the service in a calendar month. 5. The patient may stop CCM services at any time (effective at the end of the month) by phone call to the office staff. 6. The patient will be responsible for cost sharing (co-pay) of up to 20% of the service fee (after annual deductible is met).  Patient agreed to services and verbal consent obtained.   Review of patient status, including review of consultants reports, relevant laboratory and other test results, and collaboration with appropriate care team members and the patient's provider was performed as part of comprehensive patient evaluation and provision of chronic care management services.    Last CCM Appointment: 02/18/2019    Chronic Care Management from 02/18/2019 in [facility]  PHQ-9 Total Score  8     GAD 7 : Generalized Anxiety Score 02/18/2019  Nervous, Anxious, on Edge 1  Control/stop worrying 1  Worry too much - different things 1  Trouble relaxing 1  Restless 0  Easily annoyed or irritable 0  Afraid - awful might happen 1  Total GAD 7 Score 5  Anxiety Difficulty Somewhat difficult     Current Barriers:  . Financial constraints . Mental Health Concerns  . Social Isolation  Goal:  Patient Stated:  " I  want to work on my anxiety symptoms and managing anxiety"  Clinical Social Work Clinical Goal(s):  Over the next 30 days days, client will work with SW to address concerns related to anxiety issues of client and managing anxiety issues of client  Interventions: . Patient interviewed and appropriate assessments performed . Provided patient with information about CCM program services provision . Provided counseling support for client  .  Encouraged client to talk with RN CM Demetrios Loll related to nursing needs of client.  Patient Self Care Activities:  . Attends all scheduled provider appointments . Performs ADL's independently . Self administers medications as prescribed  Plan:  . LCSW to call client in 2 weeks to discuss anxiety symptoms of client and management of anxiety symptoms . Patient will communicate with LCSW to discuss psychosocial needs of client . Client to communicate with RN CM to discuss nursing needs of client . Client to attend scheduled medical appointments  *initial goal documentation    Follow Up Plan: LCSW to call client in 2 weeks to talk about anxiety symptoms management for client.       Kelton Pillar.Juliauna Stueve MSW, LCSW Licensed Clinical Social Worker [facility] Care [facility] [phone]   I have reviewed and agree with the above  documentation.   Jannifer Rodney, FNP

## 2019-02-18 NOTE — Chronic Care Management (AMB) (Addendum)
  Care Management Note  02/18/2019  [name] is a 85 y.o. year old male who sees Wyatt, Theador Hawthorne, FNP for primary care. [name], FNP asked the CCM team to consult the patient for assistance with chronic disease management related to diabetes, hypertension, anxiety disorder. Polypharmacy is also of concern. Today's telephone call focused primarily on diabetes.   Review of patient status, including review of consultants reports, relevant laboratory and other test results, and collaboration with appropriate care team members and the patient's provider was performed as part of comprehensive patient evaluation and provision of chronic care management services. Telephone outreach to patient today to introduce nurse case management services as part of the CCM services provided.  [name] previously spoke with Theadore Nan, LCSW and consented to CCM services. I discussed cost with him as well and explained that with his insurance, [facility], he has $0 out of pocket cost for CCM because it is covered at 100% by his plan.   Goals Addressed    . "I want to get control of my blood sugar" (pt-stated)       Current Barriers:  . Financial Constraints.  . Knowledge Deficits related to diabetes self management . Anxiety  Nurse Case Manager Clinical Goal(s):  [name] Over the next 60 days, patient will verbalize basic understanding of diabetes disease process and self health management plan as evidenced by a decrease in his A1C result.  Interventions:   Evaluation of current treatment plan related to diabetes and patient's adherence to plan as established by provider.  Advised patient to return for Freestyle Libre CGM placement on 02/25/19 at 4:00pm with RNCM  Reviewed medications with patient and discussed metormin dosage, timing, and mechanism of action  Collaborated with Theadore Nan, LCSW regarding psychosocial needs  Discussed plans with patient for ongoing care management follow up and  provided patient with direct contact information for care management team  Reviewed scheduled/upcoming provider appointments including: 04/19/19 with [name], [name], providing education and rationale, to check cbg 3 times daily and record, calling our office at [phone] for findings outside established parameters  Referral to [facility] Pharmacist to explore prescription assistance options  Consider referral to diabetes educator    Patient Self Care Activities:  . Self administers medications as prescribed . Attends all scheduled provider appointments . Calls pharmacy for medication refills . Attends church or other social activities . Performs ADL's independently . Performs IADL's independently  Plan:  . Patient will return for scheduled appointment with [facility] on 02/25/2019  *initial goal documentation         Follow Up Plan:   Face to Face appointment with CCM team member scheduled for 02/25/2019 for Initial face to face visit and intake assessment as well as CGM sensor placement.   The patient has been provided with contact information for the chronic care management team and has been advised to call with any health related questions or concerns.    [name], RN-BC, BSN Nurse Case Manager [facility] [phone]   I have reviewed and agree with the above  documentation.   [name], FNP

## 2019-02-22 ENCOUNTER — Other Ambulatory Visit: Payer: Self-pay

## 2019-02-22 NOTE — Patient Outreach (Signed)
[facility]) Care Management  02/22/2019  [name] August 02, 1933 [id number]   Telephone Screen  Referral Date: 02/22/2019 Referral Source: MD office Referral Reason:  "needs help from pharmacy team-RX assistance programs, unable to afford some of his meds-primrily insulin" Insurance: [facility] Medicare   Outreach attempt #1 to patient. Main number listed for patient not in service. RN CM attempted alternate number. No answer at present and no voicemail set up.    Plan: RN CM will make outreach attempt to patient within 3-4 business days. RN CM will send unsuccessful outreach letter to patient.   Enzo Montgomery, RN,BSN,CCM [place] Management Telephonic Care Management Coordinator Direct Phone: [phone] Toll Free: [phone] Fax: [phone]

## 2019-02-22 NOTE — Patient Instructions (Signed)
Visit Information  Goals    . "I want to get control of my blood sugar" (pt-stated)     Current Barriers:  . Financial Constraints.  . Knowledge Deficits related to diabetes self management  . Anxiety  Nurse Case Manager Clinical Goal(s):  [name] Over the next 60 days, patient will verbalize basic understanding of diabetes disease process and self health management plan as evidenced by a decrease in his A1C result.  Interventions:   Evaluation of current treatment plan related to diabetes and patient's adherence to plan as established by provider.  Advised patient to return for Freestyle Libre CGM placement on 02/25/19 at 4:00pm with RNCM  Reviewed medications with patient and discussed metormin dosage, timing, and mechanism of action  Collaborated with Theadore Nan, LCSW regarding psychosocial needs  Discussed plans with patient for ongoing care management follow up and provided patient with direct contact information for care management team  Reviewed scheduled/upcoming provider appointments including: 04/19/19 with [name], [place] patient, providing education and rationale, to check cbg 3 times daily and record, calling our office at [phone] for findings outside established parameters  Referral to [facility] Pharmacist to explore prescription assistance options  Consider referral to diabetes educator    Patient Self Care Activities:  . Self administers medications as prescribed . Attends all scheduled provider appointments . Calls pharmacy for medication refills . Attends church or other social activities . Performs ADL's independently . Performs IADL's independently  Plan:  . Patient will return for scheduled appointment with [facility] on 02/25/2019         Education:  Verbal information provided on how often to check blood sugar  Call if blood sugar is consistently out of provider recommended parameters  Follow provider recommended strict low carb  diet  Verbal information provided on how meals affect blood sugar   The patient verbalized understanding of instructions provided today and declined a print copy of patient instruction materials.   Plan  Face to Face appointment with CCM team member scheduled for:  02/25/2019 for continuous glucose monitor placement and further discussion regarding diabetes and other chronic medical conditions.  Referral to [facility] Pharmacist for help with Rx Assistance Program  Keep regularly scheduled appt with PCP   [name], RN-BC, BSN Nurse Case Manager [place] [phone] '

## 2019-02-24 ENCOUNTER — Other Ambulatory Visit: Payer: Self-pay

## 2019-02-24 NOTE — Patient Outreach (Signed)
[facility]) Care Management  02/24/2019  [name] Jul 17, 1933 [id number]   Telephone Screen  Referral Date: 02/22/2019 Referral Source: MD office Referral Reason:  "needs help from pharmacy team-RX assistance programs, unable to afford some of his meds-primrily insulin" Insurance: [facility] Medicare    Outreach attempt #2 to patient. No answer at present and no voicemail set up.      Plan: RN CM will make outreach attempt to patient within 3-4 business days.   Enzo Montgomery, RN,BSN,CCM [place] [facility] Telephonic Care Management Coordinator Direct Phone: [phone] Toll Free: [phone] Fax: [phone]

## 2019-02-25 ENCOUNTER — Ambulatory Visit: Payer: Medicare Other | Admitting: *Deleted

## 2019-02-25 DIAGNOSIS — Z794 Long term (current) use of insulin: Principal | ICD-10-CM

## 2019-02-25 DIAGNOSIS — E119 Type 2 diabetes mellitus without complications: Secondary | ICD-10-CM

## 2019-02-25 NOTE — Patient Instructions (Signed)
Come back in 2 weeks to have that sensor on your arm removed and the data downloaded. If you have any problems please let me know.   1. CCM service includes personalized support from designated clinical staff supervised by his physician, including individualized plan of care and coordination with other care providers 2. 24/7 contact phone numbers for assistance for urgent and routine care needs. 3. Service will only be billed when office clinical staff spend 20 minutes or more in a month to coordinate care. 4. Only one practitioner may furnish and bill the service in a calendar month. 5. The patient may stop CCM services at any time (effective at the end of the month) by phone call to the office staff. 6. The patient will be responsible for cost sharing (co-pay) of up to 20% of the service fee (after annual deductible is met).  Patient agreed to services and verbal consent obtained.    [name], RN-BC, BSN Nurse Case Manager [place] [phone]    .

## 2019-02-26 ENCOUNTER — Other Ambulatory Visit: Payer: Self-pay

## 2019-02-26 NOTE — Patient Outreach (Signed)
[facility]) Care Management  02/26/2019  [name] 1933/06/26 [id number]   Telephone Screen  Referral Date:02/22/2019 Referral Source:MD office Referral Reason:"needs help from pharmacy team-RX assistance programs, unable to afford some of his meds-primrily insulin" Insurance:[employer] Medicare   Outreach attempt #3 to patient. Spoke with patient and discussed and reviewed referral. Patient reports that he is taking about 13 meds. He voices that he has been without one of his insulins(Novolog) for about six months. He voices that he is about to run out of his Toujeo. He also states that he his unable to afford diabetic supplies. He states that his cbg this  Morning was 177 and he is checking them 2x/day. Patient states that his prostate med is costly as well. He lives alone and is no fixed income. He states that he is independent with ADLs/IADLs. He drives himself to medical appts. Patient is currently being followed by CCM at PCP office. He reports that he was at MD office on yesterday and was een and evaluated for some "pus on leg". He voices that his only need right now is pharmacy assistance.     Plan: RN CM will send [place] referral for polypharmacy med review and possible med assistance.   Enzo Montgomery, RN,BSN,CCM [place] Management Telephonic Care Management Coordinator Direct Phone: [phone] Toll Free: [phone] Fax: [phone]

## 2019-03-01 ENCOUNTER — Other Ambulatory Visit: Payer: Self-pay | Admitting: Family

## 2019-03-01 ENCOUNTER — Other Ambulatory Visit: Payer: Self-pay | Admitting: Pharmacy Technician

## 2019-03-01 ENCOUNTER — Other Ambulatory Visit: Payer: Self-pay

## 2019-03-01 NOTE — Patient Outreach (Addendum)
[facility]) Care Management  [place]   03/01/2019  Estell Dillinger [birth year] [id number]  Reason for referral: Medication Assistance with insulin and Toujeo.  Referral source: [facility] RN Current insurance:[employer]  PMHx includes but not limited to:   Hypertension, GERD, type 2 diabetes mellitus, hyperlipidemia, benign prostatic hyperplasia, anxiety and back pain  Outreach:  Successful telephone call with Mr. Stlaurent.  HIPAA identifiers verified.   Subjective:  Mr. Lipford reports that he has not been out of Novolog since last August because he can't afford it.  He states that he receives Toujeo samples from his PCP.  He reports that he checks his CBGs twice daily and that it runs between 160-180 mg/dL.   Objective: Lab Results  Component Value Date   CREATININE 1.01 01/18/2019   CREATININE 0.84 10/22/2018   CREATININE 0.98 07/17/2018    Lab Results  Component Value Date   HGBA1C 10.1 (H) 01/18/2019    Lipid Panel     Component Value Date/Time   CHOL 136 01/18/2019 1132   CHOL 116 05/19/2013 1045   TRIG 120 01/18/2019 1132   TRIG 149 10/13/2013 1252   TRIG 94 05/19/2013 1045   HDL 45 01/18/2019 1132   HDL 49 10/13/2013 1252   HDL 41 05/19/2013 1045   CHOLHDL 3.0 01/18/2019 1132   CHOLHDL 4.5 02/27/2012 0455   VLDL 33 02/27/2012 0455   LDLCALC 67 01/18/2019 1132   LDLCALC 67 10/13/2013 1252   LDLCALC 56 05/19/2013 1045   LDLDIRECT 89 12/02/2014 1250    BP Readings from Last 3 Encounters:  01/18/19 (!) 159/80  10/28/18 (!) 155/72  10/16/18 136/64    Allergies  Allergen Reactions  . Diclofenac   . Flexeril [Cyclobenzaprine]   . Guaifenesin Er     Believes that it makes him "black out"    Medications Reviewed Today    Reviewed by Dionne Milo, [facility] (Pharmacist) on 03/01/19 at [place] List Status: <None>  Medication Order Taking? Sig Documenting Provider Last Dose Status Informant  amLODipine (NORVASC) 10 MG  tablet [id number] Yes TAKE 1 TABLET BY MOUTH EVERY DAY [name], FNP Taking Active   aspirin 81 MG tablet [id number] Yes Take 81 mg by mouth daily. Takes EC aspirin [provider] Taking Active Self  atorvastatin (LIPITOR) 20 MG tablet [id number] Yes Take 1 tablet (20 mg total) by mouth daily. [name] A, FNP Taking Active   diazepam (VALIUM) 5 MG tablet [id number] Yes TAKE 1 TABLET 2 TIMES DAILY AS NEEDED [name] A, FNP Taking Active   DULoxetine (CYMBALTA) 30 MG capsule [id number] Yes Take 1 capsule (30 mg total) by mouth daily. [name], FNP Taking Active   finasteride (PROSCAR) 5 MG tablet [id number] Yes TAKE 1 TABLET (5 MG TOTAL) BY MOUTH AT BEDTIME. FOR PROSTATE [name], FNP Taking Active   furosemide (LASIX) 20 MG tablet [id number] Yes TAKE 1 OR 2 TABLETS (20-40 MG TOTAL) BY MOUTH DAILY. [name], FNP Taking Active   glucose blood (ONE TOUCH ULTRA TEST) test strip [id number] Yes USE FOR TESTING TWICE A DAY Hawks, Christy A, FNP Taking Active   insulin aspart (NOVOLOG FLEXPEN) 100 UNIT/ML FlexPen [id number] No Inject 20 Units into the skin daily with breakfast.  Patient not taking:  Reported on 02/22/2019   Cherre Robins, PharmD Not Taking Active            Med Note Joaquim Lai, Cyril Loosen   Mon Jan 18, 2019 10:52 AM) Out of medication  Insulin Glargine, 2 Unit Dial, (TOUJEO MAX SOLOSTAR) 300 UNIT/ML SOPN [id number] Yes Inject 8 Units into the skin daily.  [name], FNP Taking Active   losartan (COZAAR) 100 MG tablet [id number] Yes Take 1 tablet (100 mg total) by mouth daily. [name] A, FNP Taking Active   meloxicam (MOBIC) 7.5 MG tablet [id number] Yes TAKE 1 TABLET BY MOUTH EVERY DAY Hawks, Christy A, FNP Taking Active   metFORMIN (GLUCOPHAGE-XR) 500 MG 24 hr tablet [id number] Yes TAKE 3 TABLETS (1,500 MG TOTAL) BY MOUTH DAILY WITH BREAKFAST.  Patient taking differently:  Take 500 mg by mouth 3 (three) times daily with meals.    [name], FNP Taking Active   [facility] DELICA LANCETS 93X MISC [id number] Yes 1 STICK BY DOES NOT APPLY ROUTE 2 (TWO) TIMES DAILY. [name] A, FNP Taking Active   tamsulosin (FLOMAX) 0.4 MG CAPS capsule [id number] Yes TAKE 2 CAPSULES (0.8 MG TOTAL) BY MOUTH AT BEDTIME. FOR PROSTATE. [name], FNP Taking Active   ULTICARE MICRO PEN NEEDLES 32G X 4 MM MISC [id number] Yes USE AS DIRECTED BY MD [name], FNP Taking Active   ULTICARE MICRO PEN NEEDLES 32G X 4 MM MISC [id number] Yes USE AS DIRECTED BY MD [name], FNP Taking Active   Vitamin D, Ergocalciferol, (DRISDOL) 1.25 MG (50000 UT) CAPS capsule [id number] Yes TAKE ONE CAPSULE ONCE A WEEK  Patient taking differently:  Takes on Sunday   Dettinger, Fransisca Kaufmann, MD Taking Active           Assessment:  Drugs sorted by system:  Neurologic/Psychologic: diazepam, duloxetine   Cardiovascular: amlodipine, aspirin, atorvastatin, furosemide  Endocrine: Novolog, Toujeo, metformin  Pain: meloxicam   Genitourinary: finasteride, tamsulosin  Medication Review Findings:  . Novolog- patient said he has not had since August 2019. [name] [facility], amlodipine, finasteride (needs [facility]), tamsulosin and atorvastatin   Medication Assistance Findings:  Medication assistance needs identified.  Previously, had spoken with Mr. Mcdonnell in late 2019 and he states that he did not have $25 for a bottle of Relion insulin at [facility].   Extra Help:   []  Already receiving Full Extra Help  []  Already receiving Partial Extra Help  []  Eligible based on reported income and assets  [x]  Not Eligible based on reported income and assets  Patient Assistance Programs:  C. Hawks,NP has authorized that we can apply for the following insulins. 1) Humalog and Basaglar made by [employer] requirement met: [x]  Yes []  No []  Unknown o Out-of-pocket prescription expenditure met:    []  Yes []  No  []   Unknown  [x]  Not applicable - Patient has met application requirements to apply for this patient assistance program.    Plan: I will route patient assistance letter to [place] technician who will coordinate patient assistance program application process for medications listed above.  [facility] pharmacy technician will assist with obtaining all required documents from both patient and provider(s) and submit application(s) once completed.     PCP will need to determine dose of Basaglar if approved for Lilly medication assistance.  She will need to evaluate Humalog dose since patient reports that he has not had Novolog since August 2019.    Joetta Manners, PharmD Clinical Pharmacist [place] [phone]

## 2019-03-01 NOTE — Patient Outreach (Signed)
[facility]) Care Management  03/01/2019  [name] 02-Aug-1933 [id number]                          Medication Assistance Referral  Referral From: [place]  Medication/Company: Humalog and Basaglar / Gean [place] Patient application portion:  Education officer, museum portion: Faxed  to C. Hawks   Follow up:  Will follow up with patient in 5-7 business days to confirm application(s) have been received.  [name] Certified Pharmacy Technician [place] Management Direct Dial:[phone]

## 2019-03-02 ENCOUNTER — Other Ambulatory Visit: Payer: Self-pay

## 2019-03-02 NOTE — Patient Outreach (Signed)
[facility]) Care Management  03/02/2019  [name] April 17, 1933 [id number]  Incoming message received from [name], with successful return outreach attempt.  HIPAA identifiers verified.   [name] reports that he went to pick up his pen needles and that he could not afford the $29 price.   [facility] verified it was billed under Medicare Part D.  She informs that the PCP needs to write a new prescription with the diagnosis code, insulin dependent or not and specific directions of how many times a day he checks his CBGs in order to bill to Part B. In-basket message sent to PCP, Kayren Eaves, NP asking her to send in a new prescription with items mentioned previously.  Plan: Await response from PCP and then have [facility] this prescription.  Joetta Manners, PharmD Clinical Pharmacist [place] [phone]

## 2019-03-03 ENCOUNTER — Telehealth: Payer: Self-pay | Admitting: *Deleted

## 2019-03-03 DIAGNOSIS — E119 Type 2 diabetes mellitus without complications: Secondary | ICD-10-CM

## 2019-03-03 DIAGNOSIS — Z794 Long term (current) use of insulin: Principal | ICD-10-CM

## 2019-03-03 NOTE — Telephone Encounter (Signed)
Please expect papers to sign  for approved patient assistance.     He can not afford his pen needles but if you will send in a new script with specific information, they will be covered through his part d insurance.   Please note whether dependent or non dependant for insulin.  Please add all diagnosis codes,  How many times to test with exact description of how to use.  All this information will help qualify needles under this plan.   I will call back tomorrow to check if complete.   Thank you

## 2019-03-04 ENCOUNTER — Ambulatory Visit (INDEPENDENT_AMBULATORY_CARE_PROVIDER_SITE_OTHER): Payer: Medicare Other | Admitting: Licensed Clinical Social Worker

## 2019-03-04 ENCOUNTER — Ambulatory Visit: Payer: Self-pay | Admitting: *Deleted

## 2019-03-04 ENCOUNTER — Telehealth: Payer: Self-pay | Admitting: Family

## 2019-03-04 ENCOUNTER — Encounter: Payer: Self-pay | Admitting: *Deleted

## 2019-03-04 DIAGNOSIS — Z794 Long term (current) use of insulin: Principal | ICD-10-CM

## 2019-03-04 DIAGNOSIS — E1159 Type 2 diabetes mellitus with other circulatory complications: Secondary | ICD-10-CM

## 2019-03-04 DIAGNOSIS — F419 Anxiety disorder, unspecified: Secondary | ICD-10-CM

## 2019-03-04 DIAGNOSIS — E119 Type 2 diabetes mellitus without complications: Secondary | ICD-10-CM

## 2019-03-04 DIAGNOSIS — K219 Gastro-esophageal reflux disease without esophagitis: Secondary | ICD-10-CM

## 2019-03-04 DIAGNOSIS — I10 Essential (primary) hypertension: Secondary | ICD-10-CM | POA: Diagnosis not present

## 2019-03-04 DIAGNOSIS — I152 Hypertension secondary to endocrine disorders: Secondary | ICD-10-CM

## 2019-03-04 MED ORDER — INSULIN PEN NEEDLE 32G X 4 MM MISC: 10 refills | Status: DC

## 2019-03-04 MED ORDER — BASAGLAR KWIKPEN 100 UNIT/ML ~~LOC~~ SOPN: 10.0000 [IU] | PEN_INJECTOR | Freq: Every day | SUBCUTANEOUS | 4 refills | Status: DC

## 2019-03-04 MED ORDER — INSULIN LISPRO (1 UNIT DIAL) 100 UNIT/ML (KWIKPEN): 15.0000 [IU] | PEN_INJECTOR | Freq: Three times a day (TID) | SUBCUTANEOUS | 11 refills | Status: DC

## 2019-03-04 NOTE — Patient Instructions (Addendum)
Licensed Clinical Water engineer Provided:  No   Goals we discussed today:   Current Barriers:  Film/video editor  Mental Health Concerns   Social Isolation issues  Food procurement challenges  Goal: Patient Stated: " I want to work on my anxiety symptoms and managing anxiety"  Clinical Social Work Clinical Goal(s): Over the next30 daysdays, client will work with SW to address concerns related to anxiety issues of client and managing anxiety issues of client  Interventions:  Patient interviewed and appropriate assessments performed  Provided patient with information about CCM program services provision  Provided counseling support for client   Encouraged client to talk with RN CM [name] related to nursing needs of client.  Patient Self Care Activities:  Attends all scheduled provider appointments  Performs ADL's independently  Self administers medications as prescribed  Plan:  LCSW to call client in 3 weeks to discuss anxiety symptoms of client and management of anxiety symptoms  Patient will communicate with LCSW to discuss psychosocial needs of client  Client to communicate with RN CM to discuss nursing needs of client  Client to attend scheduled medical appointments  Client to use relaxation techniques of choice to help him manage anxiety symptoms  *initial goal documentation  Follow Up Plan:LCSW to call client in 3 weeks to talk about anxiety symptoms management for client.  The patient verbalized understanding of instructions provided today and declined a print copy of patient instruction materials.    [name].[name] MSW, LCSW Licensed Clinical Social Worker [facility] Care Management [phone]

## 2019-03-04 NOTE — Chronic Care Management (AMB) (Addendum)
Chronic Care Management    Clinical Social Work General Follow Up Note  03/04/2019 Name: [name] MRN: [id number] DOB: 1933/02/10  [name] is a 85 y.o. year old male who is a primary care patient of Junie Spencer, FNP. Junie Spencer, FNP  asked the CCM team to consult the patient for assistance with psychosocial assessment and counseling support.   Review of patient status, including review of consultants reports, relevant laboratory and other test results, and collaboration with appropriate care team members and the patient's provider was performed as part of comprehensive patient evaluation and provision of chronic care management services.    Depression screen PHQ 2/9 02/18/2019  Decreased Interest 1  Down, Depressed, Hopeless 1  PHQ - 2 Score 2  Altered sleeping 1  Tired, decreased energy 1  Change in appetite 0  Feeling bad or failure about yourself  1  Trouble concentrating 2  Moving slowly or fidgety/restless 1  Suicidal thoughts 0  PHQ-9 Score 8  Difficult doing work/chores Somewhat difficult  Some recent data might be hidden     GAD 7 : Generalized Anxiety Score 02/18/2019  Nervous, Anxious, on Edge 1  Control/stop worrying 1  Worry too much - different things 1  Trouble relaxing 1  Restless 0  Easily annoyed or irritable 0  Afraid - awful might happen 1  Total GAD 7 Score 5  Anxiety Difficulty Somewhat difficult     Social Determinants of Health :  At risk for Depression. At risk for Social Isolation. Has financial challenges. Has food procurement issues.   Current Barriers:   Financial constraints  Mental Health Concerns   Social Isolation issues  Food procurement challenges  Goal:  Patient Stated:  " I want to work on my anxiety symptoms and managing anxiety"  Clinical Social Work Clinical Goal(s):  Over the next 30 days days, client will work with SW to address concerns related to anxiety issues of client and managing anxiety issues of  client  Interventions:  Patient interviewed and appropriate assessments performed  Provided patient with information about CCM program services provision  Provided counseling support for client    Encouraged client to talk with RN CM Demetrios Loll related to nursing needs of client.  Patient Self Care Activities:   Attends all scheduled provider appointments  Performs ADL's independently  Self administers medications as prescribed  Plan:   LCSW to call client in 3 weeks to discuss anxiety symptoms of client and management of anxiety symptoms  Patient will communicate with LCSW to discuss psychosocial needs of client  Client to communicate with RN CM to discuss nursing needs of client  Client to attend scheduled medical appointments  Client to use relaxation techniques of choice to help him manage anxiety symptoms  *initial goal documentation    Follow Up Plan: LCSW to call client in 3 weeks to talk about anxiety symptoms management for client.       Kelton Pillar.Tramain Gershman MSW, LCSW Licensed Clinical Social Worker [facility] Care Management [phone]  I have reviewed and agree with the above  documentation.   Jannifer Rodney, FNP

## 2019-03-04 NOTE — Telephone Encounter (Signed)
Prescription sent to pharmacy. Completed forms for financial assistance.   Please inform patient that we have changed his long acting insulin to Basaglar 10 units at bedtime. This will replace the Toujeo samples we gave him.  We have changed his Novolog to Humalog. He will take up to 15 units three times a day.

## 2019-03-04 NOTE — Telephone Encounter (Signed)
Patient aware.

## 2019-03-04 NOTE — Addendum Note (Signed)
Addended by: [name] A on: 03/04/2019 01:37 PM   Modules accepted: Orders

## 2019-03-04 NOTE — Chronic Care Management (AMB) (Addendum)
  Chronic Care Management   Note  03/04/2019 Name: [name] MRN: [id number] DOB: [birth year]  Theadore Nan, LCSW spoke with [name] today via telephone and consulted with me regarding his medication and insulin pen needle cost. Dale Rita has also talked with Joetta Manners, a [facility] Pharmacist regarding prescription assistance. I reviewed her recent notes and she recommended that his PCP send in a new script for pen needles with specific directions and the Dx code listed so that it can be filed with Medicare Part B instead of D. [name], FNP did send that in today but the dx code was not in the correct place. I reordered the needles per protocol with the correct information and [facility] [place]. I also [facility] and verified that he has two medications ready for pickup, tamsulosin and finesteride and they are both $4 each. His Humalog Elinor Dodge are still pending processing by the pharmacy. Theadore Nan, LCSW relayed this information to the patient and advised [facility] in about an hour to see if they have processed the new prescription for pen needles.   Follow up plan: The CM team will reach out to the patient again over the next 5 days.   [name], RN-BC, BSN Nurse Case Manager [facility] [phone]   I have reviewed and agree with the above documentation.   [name], FNP

## 2019-03-05 ENCOUNTER — Other Ambulatory Visit: Payer: Self-pay

## 2019-03-05 ENCOUNTER — Ambulatory Visit: Payer: Self-pay

## 2019-03-05 NOTE — Patient Outreach (Signed)
[facility] [facility]) Care Management  03/05/2019  Raydel Hosick 04-Mar-1933 [id number]  [facility] and spoke [facility].  The pen needles cheapest price through part D is $29/box of 100.  Needles have to go through part D.    Call placed to [facility] to see if he could get pen needles through mail order for no co-pay or cheaper.  The representative quoted the same price as [facility].   Successful outreach to Mr. Mosquera. Informed him that his needles have to process through Medicare Part D, while other items such as the lancets test strips and control solutions process through part B.  Mr. Gell verbalized understanding.   Informed him that he should look for his Lilly application in the mail soon and that hopefully, this will help him free up his finances, so he can afford his pen needles.  Of note, LCSW referral made in November of 2019 and he is not eligible for Extra Help LIS.  Joetta Manners, PharmD Clinical Pharmacist [place] [phone]

## 2019-03-09 ENCOUNTER — Other Ambulatory Visit: Payer: Self-pay | Admitting: Pharmacy Technician

## 2019-03-09 ENCOUNTER — Ambulatory Visit: Payer: Self-pay | Admitting: Licensed Clinical Social Worker

## 2019-03-09 ENCOUNTER — Other Ambulatory Visit: Payer: Self-pay

## 2019-03-09 DIAGNOSIS — F419 Anxiety disorder, unspecified: Secondary | ICD-10-CM

## 2019-03-09 DIAGNOSIS — E119 Type 2 diabetes mellitus without complications: Secondary | ICD-10-CM

## 2019-03-09 DIAGNOSIS — Z794 Long term (current) use of insulin: Secondary | ICD-10-CM

## 2019-03-09 DIAGNOSIS — K219 Gastro-esophageal reflux disease without esophagitis: Secondary | ICD-10-CM

## 2019-03-09 DIAGNOSIS — I152 Hypertension secondary to endocrine disorders: Secondary | ICD-10-CM

## 2019-03-09 DIAGNOSIS — I10 Essential (primary) hypertension: Principal | ICD-10-CM

## 2019-03-09 DIAGNOSIS — E1159 Type 2 diabetes mellitus with other circulatory complications: Secondary | ICD-10-CM

## 2019-03-09 NOTE — Patient Outreach (Signed)
[place] [facility]) Care Management  03/09/2019  [name] February 08, 1933 [id number]    Incoming call from patient regarding patient assistance application(s) for Humalog and Basaglar , HIPAA identifiers verified. Patient states that he understands that he does not have a lot of medication or money however he does not want to pursue going thru all the paperwork in order to apply for patient assistance. During conversation with patient I asked numerous times if he was sure he didn't want to apply and he stated that he didn't each time.  Verified that patient has my information and requested he contact me if he changes his mind about the application.  Will route note to [place] for case closure.  Maud Deed Chana Bode [place] Certified Pharmacy Technician [place] Management Direct Dial:[phone]

## 2019-03-09 NOTE — Progress Notes (Addendum)
  Chronic Care Management   Face to Face Consult Note  02/25/2019 Name: [name] MRN: [id number] DOB: Jan 20, 1933  Mr [name] is an 85 year old male primary care patient of Dale Dun, FNP who was referred for CCM services to coordinate care for his chronic medical conditions. Today's visit was a quick face to face visit to apply the [facility] sensor.   Goals    . "I want to get control of my blood sugar" (pt-stated)     Current Barriers:  . Financial Constraints.  . Knowledge Deficits related to diabetes self management  . Anxiety  Nurse Case Manager Clinical Goal(s):  [name] Over the next 60 days, patient will verbalize basic understanding of diabetes disease process and self health management plan as evidenced by a decrease in his A1C result.  Interventions:   Evaluation of current treatment plan related to diabetes and patient's adherence to plan as established by provider.  Advised patient to return for Freestyle [employer]re CGM placement on 02/25/19 at 4:00pm with RNCM  Reviewed medications with patient and discussed metormin dosage, timing, and mechanism of action  Collaborated with Dale Nan, LCSW regarding psychosocial needs  Discussed plans with patient for ongoing care management follow up and provided patient with direct contact information for care management team  Reviewed scheduled/upcoming provider appointments including: 04/19/19 with [name], [place] patient, providing education and rationale, to check cbg 3 times daily and record, calling our office at [phone] for findings outside established parameters  Referral to [facility] Pharmacist to explore prescription assistance options  Consider referral to diabetes educator    Patient Self Care Activities:  . Self administers medications as prescribed . Attends all scheduled provider appointments . Calls pharmacy for medication refills . Attends church or other social  activities . Performs ADL's independently . Performs IADL's independently  Plan:  . Patient will return for scheduled appointment with [facility] on 02/25/2019  *initial goal documentation     . Patient Stated     Maintain healthy diet and exercise habits.        Follow up plan: Patient scheduled for CCM visit with nurse case manager in two weeks to have the sensor data downloaded and to discuss chronic care needs and goals.   Dale Sicilian, RN-BC, BSN Nurse Case Manager [facility] [phone]   I have reviewed and agree with the above  documentation.   Dale Dun, FNP

## 2019-03-09 NOTE — Patient Outreach (Signed)
[place] [facility]) Care Management  03/09/2019  [name] May 19, 1933 [id number]   Unsuccessful return call to patient. Patient left voicemail stating that he mailed application back and requested that I contact him. Unable to leave voicemail.  Will submit completed application to company when all portions of document have been received.  Maud Deed Chana Bode [place] Certified Pharmacy Technician [place] Management Direct Dial:[phone]

## 2019-03-09 NOTE — Patient Instructions (Signed)
Licensed Clinical Social Worker Visit Information   Materials Provided: No  Joetta Manners, [facility] pharmacist,called LCSW today and informed LCSW that Etter Sjogren, Pharmacy Tech, has talked with client about medication assistance application for client and encouraged client to complete application. Client has not been willing to complete application [place] asked LCSW to call client to talk about medication application for client and medication help through this application. LCSW called client today and talked with client about medication assistance application with Etter Sjogren. Client said he did not plan to complete medication assistance application. He said he had lawsuit against a medical provider and had talked with his attorney. He said he did not plan to complete medication assistance application.     Follow Up Plan: LCSW encouraged client to call LCSW as needed to discuss social work needs of client.  The patient verbalized understanding of instructions provided today and declined a print copy of patient instruction materials.   [name].Oluwademilade Mckiver MSW, LCSW Licensed Clinical Social Worker [facility] Care Management [phone]

## 2019-03-09 NOTE — Telephone Encounter (Signed)
See CCM notes 

## 2019-03-09 NOTE — Patient Outreach (Signed)
[facility]) Care Management [place]  03/09/2019  [name] 05/04/1933 [id number]  Reason for referral: medication assistance for insulins  Incoming call received from Tina, Etter Sjogren.  Mr. Abercrombie called her this morning and reports that he does not want to continue with the patient assistance application process through [employer].   Call placed to LCSW, Theadore Nan who has worked with Mr. Mccance at his PCP's office.  LCSW was unsuccessful in convincing patient to continue with application, which would provide him free insulin. [place] will close his patient case at this time.  Patient has been provided [facility] CM contact information if assistance needed in the future.    Thank you for allowing [facility] pharmacy to be involved in this patient's care.    Joetta Manners, PharmD [place] [phone]

## 2019-03-09 NOTE — Chronic Care Management (AMB) (Addendum)
Chronic Care Management    Clinical Social Work General Follow Up Note  03/09/2019 Name: Breanna Wick MRN: [id number] DOB: Oct 30, 1933  Cloude Bittel is a 85 y.o. year old male who is a primary care patient of Junie Spencer, FNP. Junie Spencer FNP  asked the CCM team to consult the patient for assistance with psychosocial assessment and counseling support.   Review of patient status, including review of consultants reports, relevant laboratory and other test results, and collaboration with appropriate care team members and the patient's provider was performed as part of comprehensive patient evaluation and provision of chronic care management services.    Depression screen PHQ 2/9 02/18/2019  Decreased Interest 1  Down, Depressed, Hopeless 1  PHQ - 2 Score 2  Altered sleeping 1  Tired, decreased energy 1  Change in appetite 0  Feeling bad or failure about yourself  1  Trouble concentrating 2  Moving slowly or fidgety/restless 1  Suicidal thoughts 0  PHQ-9 Score 8  Difficult doing work/chores Somewhat difficult  Some recent data might be hidden     GAD 7 : Generalized Anxiety Score 02/18/2019  Nervous, Anxious, on Edge 1  Control/stop worrying 1  Worry too much - different things 1  Trouble relaxing 1  Restless 0  Easily annoyed or irritable 0  Afraid - awful might happen 1  Total GAD 7 Score 5  Anxiety Difficulty Somewhat difficult     Berlin Hun, [facility] pharmacist,called LCSW today and informed LCSW that Lilla Shook, Pharmacy Tech, has talked with client about medication assistance application for client and encouraged client to complete application. Client has not been willing to complete application Victorino Dike asked LCSW to call client to talk about medication application for client and medication help through this application. LCSW called client today and talked with client about medication assistance application with Lilla Shook. Client said he did not plan to  complete medication assistance application. He said he had lawsuit against a medical provider and had talked with his attorney. He said he did not plan to complete medication assistance application.      Follow Up Plan: LCSW encouraged client to call LCSW as needed to discuss social work needs of client.  Kelton Pillar.Ellason Segar MSW, LCSW Licensed Clinical Social Worker [facility] Care Management [phone]   I have reviewed and agree with the above documentation.   Jannifer Rodney, FNP

## 2019-03-10 ENCOUNTER — Encounter: Payer: Self-pay | Admitting: *Deleted

## 2019-03-11 ENCOUNTER — Other Ambulatory Visit: Payer: Self-pay

## 2019-03-11 ENCOUNTER — Ambulatory Visit: Payer: Medicare Other | Admitting: *Deleted

## 2019-03-11 DIAGNOSIS — E119 Type 2 diabetes mellitus without complications: Secondary | ICD-10-CM

## 2019-03-11 DIAGNOSIS — E1159 Type 2 diabetes mellitus with other circulatory complications: Secondary | ICD-10-CM

## 2019-03-11 DIAGNOSIS — F419 Anxiety disorder, unspecified: Secondary | ICD-10-CM

## 2019-03-11 DIAGNOSIS — Z794 Long term (current) use of insulin: Principal | ICD-10-CM

## 2019-03-11 DIAGNOSIS — I10 Essential (primary) hypertension: Secondary | ICD-10-CM

## 2019-03-11 NOTE — Chronic Care Management (AMB) (Addendum)
  Chronic Care Management   Face to Face Follow Up Note   03/11/2019 Name: [name] MRN: [id number] DOB: [birth year]  Referred by: [name], FNP Reason for referral : Chronic Care Management (Face to face to review Freestyle [employer]re results and setup CCM goals)   [name] is a 85 y.o. year old male who is a primary care patient of [name], FNP. The CCM team was consulted for assistance with chronic disease management and care coordination needs.    Review of patient status, including review of consultants reports, relevant laboratory and other test results, and collaboration with appropriate care team members and the patient's provider was performed as part of comprehensive patient evaluation and provision of chronic care management services.    Today's face to face visit and education primarily focused on diabetes management.   Goals Addressed    . "I want to get control of my blood sugar" (pt-stated)       Current Barriers:  . Financial Constraints.  . Knowledge Deficits related to diabetes self management  . Anxiety  Nurse Case Manager Clinical Goal(s):  [name] Over the next 30 days, patient will verbalize basic understanding of diabetes disease process and self health management plan as evidenced by a decrease in his A1C result. . Over the next 60 days, patient will demonstrate improved self management of diabetes as evidenced by better blood sugar control. . Over the next 60 days, patient will follow provider recommended treatment plan for management of diabetes  Interventions:   Evaluation of current treatment plan related to diabetes and patient's adherence to plan as established by provider  Discussed plans with patient for ongoing care management follow up and provided patient with direct contact information for care management team  Reviewed scheduled/upcoming provider appointments including: 04/19/19 with [name], [place] patient,  providing education and rationale, to check cbg 3 times daily and record, calling our office at [phone] for findings outside established parameters  Reviewed chart and [facility] pharmacist notes. Patient refused to complete forms for financial assistance with medications.   Recommended to schedule diabetic eye exam  Recommended to continue seeing podiatrist once a month  Downloaded CGM results and reviewed with patient    Patient Self Care Activities:  . Self administers medications as prescribed . Attends all scheduled provider appointments . Calls pharmacy for medication refills . Attends church or other social activities . Performs ADL's independently . Performs IADL's independently  Plan  Patient will keep all follow up appointments . RNCM will follow up with patient by telephone over the next 30 month  . RNCM will collaborate with PCP regarding CGM results        CCM Follow Up Plan CM will reach out to the patient via telephone over the next 30 days Next PCP appointment scheduled for: 04/19/2019 with [name], [place] Patient provider with CM contact information and advised to reach out as needed   [name], RN-BC, BSN Nurse Case Manager [place] [phone]    I have reviewed and agree with the above  documentation.   [name], FNP

## 2019-03-11 NOTE — Patient Instructions (Signed)
Visit Information  Goals Addressed    . "I want to get control of my blood sugar" (pt-stated)       Current Barriers:  . Financial Constraints.  . Knowledge Deficits related to diabetes self management  . Anxiety  Nurse Case Manager Clinical Goal(s):  [name] Over the next 30 days, patient will verbalize basic understanding of diabetes disease process and self health management plan as evidenced by a decrease in his A1C result. . Over the next 60 days, patient will demonstrate improved self management of diabetes as evidenced by better blood sugar control. . Over the next 60 days, patient will follow provider recommended treatment plan for management of diabetes  Interventions:   Evaluation of current treatment plan related to diabetes and patient's adherence to plan as established by provider  Discussed plans with patient for ongoing care management follow up and provided patient with direct contact information for care management team  Reviewed scheduled/upcoming provider appointments including: 04/19/19 with [name], [place] patient, providing education and rationale, to check cbg 3 times daily and record, calling our office at [phone] for findings outside established parameters  Reviewed chart and [facility] pharmacist notes. Patient refused to complete forms for financial assistance with medications.   Recommended to schedule diabetic eye exam  Recommended to continue seeing podiatrist once a month    Patient Self Care Activities:  . Self administers medications as prescribed . Attends all scheduled provider appointments . Calls pharmacy for medication refills . Attends church or other social activities . Performs ADL's independently . Performs IADL's independently  Plan:  . Patient will keep all follow up appointments . RNCM will follow up with patient by telephone over the next 30 month       Print copy of patient instructions provided.   Plan  The CM team  will reach out to the patient again over the next 30 days.   The patient has been provided with contact information for the chronic care management team and has been advised to call with any health related questions or concerns.     [name], RN-BC, BSN Nurse Case Manager [place] [phone]

## 2019-03-24 ENCOUNTER — Ambulatory Visit: Payer: Self-pay | Admitting: Licensed Clinical Social Worker

## 2019-03-24 ENCOUNTER — Other Ambulatory Visit: Payer: Self-pay | Admitting: Family

## 2019-03-24 DIAGNOSIS — I10 Essential (primary) hypertension: Secondary | ICD-10-CM

## 2019-03-24 DIAGNOSIS — F419 Anxiety disorder, unspecified: Secondary | ICD-10-CM

## 2019-03-24 DIAGNOSIS — E1159 Type 2 diabetes mellitus with other circulatory complications: Secondary | ICD-10-CM

## 2019-03-24 DIAGNOSIS — E119 Type 2 diabetes mellitus without complications: Secondary | ICD-10-CM

## 2019-03-24 DIAGNOSIS — K219 Gastro-esophageal reflux disease without esophagitis: Secondary | ICD-10-CM

## 2019-03-24 NOTE — Patient Instructions (Signed)
Licensed Clinical Social Worker Visit Information  Goals we discussed today:  Goals Addressed            This Visit's Progress   . Client stated: "I want to work on my anxiety symptoms and managing anxiety" (pt-stated)       Current Barriers:  . Financial constraints . Mental Health Concerns   . Food procurement challenges . Social isolation  Clinical Social Work Clinical Goal(s):  [name] Over the next 30 days, client will work with SW to address concerns related to anxiety and anxiety management for client  Interventions: . Patient interviewed and appropriate assessments performed  Provided patient with information about CCM program services   Provided counseling support for client  Self Care Activities: Attends scheduled provider appointments Self administers prescribed medications Perform ADLs  Plan: Client to attend scheduled medical appointments Client to communicate with RN CM to discuss nursing needs of client LCSW to call client in 3 weeks to discuss anxiety symptoms of client and client management of anxiety symptoms  Please see past updates related to this goal by clicking on the "Past Updates" button in the selected goal     Materials Provided: No  Follow Up Plan: LCSW to call client in 3 weeks to discuss anxiety symptoms of client and client management of anxiety symptoms  The patient verbalized understanding of instructions provided today and declined a print copy of patient instruction materials.   [name].Laniya Friedl MSW, LCSW Licensed Clinical Social Worker [facility] Care Management [phone]

## 2019-03-24 NOTE — Chronic Care Management (AMB) (Addendum)
  Care Management Note   [name] is a 85 y.o. year old male who is a primary care patient of [name], FNP. The CM team was consulted for assistance with chronic disease management and care coordination.   I reached out to [facility] by phone today.   Review of patient status, including review of consultants reports, relevant laboratory and other test results, and collaboration with appropriate care team members and the patient's provider was performed as part of comprehensive patient evaluation and provision of chronic care management services.   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 Client stated: "I want to work on my anxiety symptoms and managing anxiety" (pt-stated)       Current Barriers:  . Financial constraints . Mental Health Concerns   . Food procurement challenges . Social isolation  Clinical Social Work Clinical Goal(s):  [name] Over the next 30 days, client will work with SW to address concerns related to anxiety and anxiety management for client  Interventions: . Patient interviewed and appropriate assessments performed  Provided patient with information about CCM program services   Provided counseling support for client  Talked with client about current client situation with food, transportation, medications  Self Care Activities: Attends scheduled provider appointments Self administers prescribed medications Perform ADLs  Plan:  Client to attend scheduled medical appointments Client to communicate with RN CM to discuss nursing needs of client LCSW to call client in 3 weeks to discuss anxiety symptoms of client and client management of anxiety symptoms  Please see past updates related to this goal by clicking on the "Past Updates" button in the selected goal     Follow Up Plan: LCSW to call client in 3  weeks to discuss anxiety symptoms of client and client management of anxiety symptoms  [name].Oumar Marcott MSW, LCSW Licensed Clinical Social Worker [facility] Care Management [phone]  I have reviewed and agree with the above documentation.   [name], FNP

## 2019-03-24 NOTE — Telephone Encounter (Signed)
Last VitD 08/04/17  27.9

## 2019-03-30 ENCOUNTER — Other Ambulatory Visit: Payer: Self-pay | Admitting: Family

## 2019-04-05 ENCOUNTER — Other Ambulatory Visit: Payer: Self-pay

## 2019-04-05 ENCOUNTER — Emergency Department (HOSPITAL_COMMUNITY)
Admission: EM | Admit: 2019-04-05 | Discharge: 2019-04-05 | Disposition: A | Discharge status: Home / Self Care | Payer: Medicare Other | Attending: Emergency Medicine | Admitting: Emergency Medicine

## 2019-04-05 ENCOUNTER — Emergency Department (HOSPITAL_COMMUNITY): Payer: Medicare Other

## 2019-04-05 ENCOUNTER — Encounter (HOSPITAL_COMMUNITY): Payer: Self-pay | Admitting: Emergency Medicine

## 2019-04-05 DIAGNOSIS — E119 Type 2 diabetes mellitus without complications: Secondary | ICD-10-CM | POA: Diagnosis not present

## 2019-04-05 DIAGNOSIS — Z794 Long term (current) use of insulin: Secondary | ICD-10-CM | POA: Insufficient documentation

## 2019-04-05 DIAGNOSIS — M25512 Pain in left shoulder: Secondary | ICD-10-CM | POA: Diagnosis not present

## 2019-04-05 DIAGNOSIS — M542 Cervicalgia: Secondary | ICD-10-CM | POA: Diagnosis not present

## 2019-04-05 DIAGNOSIS — R42 Dizziness and giddiness: Secondary | ICD-10-CM | POA: Diagnosis not present

## 2019-04-05 DIAGNOSIS — R109 Unspecified abdominal pain: Secondary | ICD-10-CM | POA: Diagnosis not present

## 2019-04-05 DIAGNOSIS — J45909 Unspecified asthma, uncomplicated: Secondary | ICD-10-CM | POA: Diagnosis not present

## 2019-04-05 DIAGNOSIS — Z87891 Personal history of nicotine dependence: Secondary | ICD-10-CM | POA: Insufficient documentation

## 2019-04-05 DIAGNOSIS — R1032 Left lower quadrant pain: Secondary | ICD-10-CM | POA: Diagnosis not present

## 2019-04-05 DIAGNOSIS — R000 Tachycardia, unspecified: Secondary | ICD-10-CM | POA: Diagnosis not present

## 2019-04-05 DIAGNOSIS — R1084 Generalized abdominal pain: Secondary | ICD-10-CM | POA: Diagnosis not present

## 2019-04-05 DIAGNOSIS — I10 Essential (primary) hypertension: Secondary | ICD-10-CM | POA: Diagnosis not present

## 2019-04-05 DIAGNOSIS — M25511 Pain in right shoulder: Secondary | ICD-10-CM | POA: Insufficient documentation

## 2019-04-05 DIAGNOSIS — Z79899 Other long term (current) drug therapy: Secondary | ICD-10-CM | POA: Insufficient documentation

## 2019-04-05 DIAGNOSIS — Z7982 Long term (current) use of aspirin: Secondary | ICD-10-CM | POA: Diagnosis not present

## 2019-04-05 DIAGNOSIS — E1165 Type 2 diabetes mellitus with hyperglycemia: Secondary | ICD-10-CM | POA: Diagnosis not present

## 2019-04-05 NOTE — ED Notes (Signed)
Patient transported to CT 

## 2019-04-05 NOTE — Discharge Instructions (Addendum)
X-ray showed no acute findings.  You will be sore for several days.  Tylenol for pain.
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
## 2019-04-13 ENCOUNTER — Telehealth: Payer: Self-pay | Admitting: Family

## 2019-04-13 NOTE — Telephone Encounter (Signed)
Pt has thought about it and would like to start doing the chronic care management stuff.

## 2019-04-14 ENCOUNTER — Ambulatory Visit (INDEPENDENT_AMBULATORY_CARE_PROVIDER_SITE_OTHER): Payer: Medicare Other | Admitting: Licensed Clinical Social Worker

## 2019-04-14 DIAGNOSIS — E1159 Type 2 diabetes mellitus with other circulatory complications: Secondary | ICD-10-CM | POA: Diagnosis not present

## 2019-04-14 DIAGNOSIS — I10 Essential (primary) hypertension: Secondary | ICD-10-CM

## 2019-04-14 DIAGNOSIS — F419 Anxiety disorder, unspecified: Secondary | ICD-10-CM

## 2019-04-14 DIAGNOSIS — K219 Gastro-esophageal reflux disease without esophagitis: Secondary | ICD-10-CM

## 2019-04-14 DIAGNOSIS — E119 Type 2 diabetes mellitus without complications: Secondary | ICD-10-CM

## 2019-04-14 NOTE — Telephone Encounter (Signed)
He is scheduled for a CCM telephone follow up this week.   [name], RN-BC, BSN Nurse Case Manager [place] [phone]
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
## 2019-04-15 ENCOUNTER — Ambulatory Visit: Payer: Self-pay | Admitting: Licensed Clinical Social Worker

## 2019-04-15 ENCOUNTER — Ambulatory Visit: Payer: Medicare Other | Admitting: *Deleted

## 2019-04-15 DIAGNOSIS — F419 Anxiety disorder, unspecified: Secondary | ICD-10-CM

## 2019-04-15 DIAGNOSIS — E119 Type 2 diabetes mellitus without complications: Secondary | ICD-10-CM

## 2019-04-15 DIAGNOSIS — E1159 Type 2 diabetes mellitus with other circulatory complications: Secondary | ICD-10-CM

## 2019-04-15 DIAGNOSIS — I10 Essential (primary) hypertension: Principal | ICD-10-CM

## 2019-04-15 DIAGNOSIS — Z794 Long term (current) use of insulin: Principal | ICD-10-CM

## 2019-04-15 DIAGNOSIS — K219 Gastro-esophageal reflux disease without esophagitis: Secondary | ICD-10-CM

## 2019-04-15 DIAGNOSIS — I152 Hypertension secondary to endocrine disorders: Secondary | ICD-10-CM

## 2019-04-15 NOTE — Chronic Care Management (AMB) (Addendum)
  Care Management Note   [name] is a 85 y.o. year old male who is a primary care patient of [name], FNP. The CM team was consulted for assistance with chronic disease management and care coordination.   I reached out to [facility] by phone today.  . Review of patient status, including review of consultants reports, relevant laboratory and other test results, and collaboration with appropriate care team members and the patient's provider was performed as part of comprehensive patient evaluation and provision of chronic care management services.   Social Determinants of Health: Financial challenges; transport challenges; social isolation needs    Chronic Care Management from 02/18/2019 in [facility]  PHQ-9 Total Score  8     GAD 7 : Generalized Anxiety Score 02/18/2019  Nervous, Anxious, on Edge 1  Control/stop worrying 1  Worry too much - different things 1  Trouble relaxing 1  Restless 0  Easily annoyed or irritable 0  Afraid - awful might happen 1  Total GAD 7 Score 5  Anxiety Difficulty Somewhat difficult    Goals Addressed            This Visit's Progress   . Client stated: "I want to work on my anxiety symptoms and managing anxiety" (pt-stated)       Current Barriers:  . Financial constraints . Mental Health Concerns   . Food procurement challenges . Social isolation  Clinical Social Work Clinical Goal(s):  [name] Over the next 30 days, client will work with SW to address concerns related to anxiety and anxiety management for client  Interventions:   Provided counseling support for client  Talked with client about current client needs  Talked with client about his social support network  Talked with client about CCM program services  Talked with client about nursing support through [place]: Attends scheduled provider appointments Self administers prescribed medications Perform ADLs  Plan:   Client to attend scheduled medical appointments Client to communicate with RN CM to discuss nursing needs of client LCSW to call client in 3 weeks to discuss anxiety symptoms of client and client management of anxiety symptoms  Please see past updates related to this goal by clicking on the "Past Updates" button in the selected goal     Follow Up Plan: LCSW to call client in next 3 weeks to discuss anxiety symptoms of client  [name].Soren Pigman MSW, LCSW Licensed Clinical Social Worker [facility] Care Management [phone]  I have reviewed and agree with the above  documentation.   [name], FNP

## 2019-04-15 NOTE — Patient Instructions (Signed)
Licensed Clinical Social Worker Visit Information  Goals we discussed today:  Goals Addressed            This Visit's Progress   . Client stated: "I want to work on my anxiety symptoms and managing anxiety" (pt-stated)       Current Barriers:  . Financial constraints . Mental Health Concerns   . Food procurement challenges . Social isolation  Clinical Social Work Clinical Goal(s):  [name] Over the next 30 days, client will work with SW to address concerns related to anxiety and anxiety management for client  Interventions:   Provided counseling support for client  Talked with client about current client needs  Talked with client about his social support network  Talked with client about CCM program services  Talked with client about nursing support through [place]: Attends scheduled provider appointments Self administers prescribed medications Perform ADLs  Plan:  Client to attend scheduled medical appointments Client to communicate with RN CM to discuss nursing needs of client LCSW to call client in 3 weeks to discuss anxiety symptoms of client and client management of anxiety symptoms  Please see past updates related to this goal by clicking on the "Past Updates" button in the selected goal     Materials Provided: No  Follow Up Plan: LCSW to call client in next 3 weeks to discuss anxiety symptoms of client and client management of anxiety symptoms   The patient verbalized understanding of instructions provided today and declined a print copy of patient instruction materials.   [name].Shalisha Clausing MSW, LCSW Licensed Clinical Social Worker [facility] Care Management [phone]

## 2019-04-19 ENCOUNTER — Ambulatory Visit: Payer: Medicare Other | Admitting: Family

## 2019-04-21 NOTE — Chronic Care Management (AMB) (Signed)
  Chronic Care Management   RN Case Management Note  04/15/2019 Name: Jaran Sainz MRN: [id number] DOB: 09/13/1933  Attempted to reach Mr Postiglione by telephone to discuss chronic care management needs but call was unsuccessful.   Follow up plan: The CM team will reach out to the patient again over the next 10 days days.   [name], RN-BC, BSN Nurse Case Manager [place] [phone]

## 2019-04-22 ENCOUNTER — Telehealth: Payer: Self-pay | Admitting: Family
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
## 2019-04-23 ENCOUNTER — Telehealth: Payer: Medicare Other

## 2019-04-26 ENCOUNTER — Other Ambulatory Visit: Payer: Self-pay

## 2019-04-26 ENCOUNTER — Ambulatory Visit (INDEPENDENT_AMBULATORY_CARE_PROVIDER_SITE_OTHER): Payer: Medicare Other | Admitting: Family

## 2019-04-26 ENCOUNTER — Ambulatory Visit: Payer: Self-pay | Admitting: Licensed Clinical Social Worker

## 2019-04-26 ENCOUNTER — Encounter: Payer: Self-pay | Admitting: Family

## 2019-04-26 DIAGNOSIS — E1169 Type 2 diabetes mellitus with other specified complication: Secondary | ICD-10-CM

## 2019-04-26 DIAGNOSIS — M545 Low back pain, unspecified: Secondary | ICD-10-CM

## 2019-04-26 DIAGNOSIS — G8929 Other chronic pain: Secondary | ICD-10-CM

## 2019-04-26 DIAGNOSIS — I10 Essential (primary) hypertension: Secondary | ICD-10-CM

## 2019-04-26 DIAGNOSIS — E119 Type 2 diabetes mellitus without complications: Secondary | ICD-10-CM

## 2019-04-26 DIAGNOSIS — Z794 Long term (current) use of insulin: Secondary | ICD-10-CM

## 2019-04-26 DIAGNOSIS — N401 Enlarged prostate with lower urinary tract symptoms: Secondary | ICD-10-CM | POA: Diagnosis not present

## 2019-04-26 DIAGNOSIS — E785 Hyperlipidemia, unspecified: Secondary | ICD-10-CM

## 2019-04-26 DIAGNOSIS — F1320 Sedative, hypnotic or anxiolytic dependence, uncomplicated: Secondary | ICD-10-CM

## 2019-04-26 DIAGNOSIS — E1159 Type 2 diabetes mellitus with other circulatory complications: Secondary | ICD-10-CM

## 2019-04-26 DIAGNOSIS — R351 Nocturia: Secondary | ICD-10-CM

## 2019-04-26 DIAGNOSIS — K219 Gastro-esophageal reflux disease without esophagitis: Secondary | ICD-10-CM

## 2019-04-26 DIAGNOSIS — Z79899 Other long term (current) drug therapy: Secondary | ICD-10-CM | POA: Diagnosis not present

## 2019-04-26 DIAGNOSIS — I152 Hypertension secondary to endocrine disorders: Secondary | ICD-10-CM

## 2019-04-26 DIAGNOSIS — F419 Anxiety disorder, unspecified: Secondary | ICD-10-CM

## 2019-04-26 MED ORDER — DIAZEPAM 5 MG PO TABS: ORAL_TABLET | ORAL | 5 refills | Status: DC

## 2019-04-26 MED ORDER — AMLODIPINE BESYLATE 10 MG PO TABS: 10.0000 mg | ORAL_TABLET | Freq: Every day | ORAL | 0 refills | Status: DC

## 2019-04-26 MED ORDER — DULOXETINE HCL 60 MG PO CPEP: 60.0000 mg | ORAL_CAPSULE | Freq: Every day | ORAL | 2 refills | Status: DC

## 2019-04-26 NOTE — Chronic Care Management (AMB) (Addendum)
  Care Management Note   [name] is a 85 y.o. year old male who is a primary care patient of Dale Balloon, FNP. The CM team was consulted for assistance with chronic disease management and care coordination.   I reached out to [facility] by phone today.   Review of patient status, including review of consultants reports, relevant laboratory and other test results, and collaboration with appropriate care team members and the patient's provider was performed as part of comprehensive patient evaluation and provision of chronic care management services.   Social Determinants of Health: Risk for Depression; Risk for tobacco exposure  GAD 7 : Generalized Anxiety Score 02/18/2019  Nervous, Anxious, on Edge 1  Control/stop worrying 1  Worry too much - different things 1  Trouble relaxing 1  Restless 0  Easily annoyed or irritable 0  Afraid - awful might happen 1  Total GAD 7 Score 5  Anxiety Difficulty Somewhat difficult    Depression screen [facility] 2/9 02/18/2019 01/18/2019 11/03/2018 10/28/2018 10/16/2018  Decreased Interest 1 0 0 1 0  Down, Depressed, Hopeless 1 0 0 0 0  PHQ - 2 Score 2 0 0 1 0  Altered sleeping 1 - - - -  Tired, decreased energy 1 - - - -  Change in appetite 0 - - - -  Feeling bad or failure about yourself  1 - - - -  Trouble concentrating 2 - - - -  Moving slowly or fidgety/restless 1 - - - -  Suicidal thoughts 0 - - - -  PHQ-9 Score 8 - - - -  Difficult doing work/chores Somewhat difficult - - - -  Some recent data might be hidden    Goals Addressed            This Visit's Progress   . Client stated: "I want to work on my anxiety symptoms and managing anxiety" (pt-stated)       Current Barriers:  . Financial constraints . Mental Health Concerns   . Food procurement challenges . Social isolation  Clinical Social Work Clinical Goal(s):  [name] Over the next 30 days, client will work with SW to address concerns related to anxiety and anxiety  management for client  Interventions:   Talked with client about current client needs  Talked with client about medical needs of one of his family members  Talked with client about pain issues of client   Self Care Activities: Attends scheduled provider appointments Self administers prescribed medications Perform ADLs  Plan:  Client to attend scheduled medical appointments Client to communicate with RN CM to discuss nursing needs of client LCSW to call client in 3 weeks to discuss anxiety symptoms of client and client management of anxiety symptoms  Please see past updates related to this goal by clicking on the "Past Updates" button in the selected goal     Client said he was trying currently to help one of his family members who is recovering from a health condition. Client drives himself to scheduled appointments and to complete community errands. He said he had his prescribed medications and was taking medications as prescribed.  Follow Up Plan: LCSW to call client in next 3 weeks to discuss anxiety symptoms of client and client management of anxiety symptoms  [name].Da Authement MSW, LCSW Licensed Clinical Social Worker [facility] Care Management [phone]  I have reviewed and agree with the above documentation.   [name], FNP
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
## 2019-04-26 NOTE — Patient Instructions (Signed)
Licensed Clinical Social Worker Visit Information  Goals we discussed today:  Goals Addressed            This Visit's Progress   . Client stated: "I want to work on my anxiety symptoms and managing anxiety" (pt-stated)       Current Barriers:  . Financial constraints . Mental Health Concerns   . Food procurement challenges . Social isolation  Clinical Social Work Clinical Goal(s):  [name] Over the next 30 days, client will work with SW to address concerns related to anxiety and anxiety management for client  Interventions:   Talked with client about current client needs    Talked with client about his social support network    Talked with client about medical needs of one of his family members  Talked with client about pain issues of client  Self Care Activities: Attends scheduled provider appointments Self administers prescribed medications Perform ADLs  Plan:  Client to attend scheduled medical appointments Client to communicate with RN CM to discuss nursing needs of client LCSW to call client in 3 weeks to discuss anxiety symptoms of client and client management of anxiety symptoms  Please see past updates related to this goal by clicking on the "Past Updates" button in the selected goal     Materials Provided: No  Follow Up Plan: LCSW to call client in next 3 weeks to discuss anxiety symptoms of client and client management of anxiety symptoms.  The patient verbalized understanding of instructions provided today and declined a print copy of patient instruction materials.   [name].Jesicca Dipierro MSW, LCSW Licensed Clinical Social Worker [facility] Care Management [phone]

## 2019-04-27 ENCOUNTER — Ambulatory Visit: Payer: Self-pay | Admitting: Licensed Clinical Social Worker

## 2019-04-27 ENCOUNTER — Telehealth: Payer: Self-pay | Admitting: Family

## 2019-04-27 DIAGNOSIS — E119 Type 2 diabetes mellitus without complications: Secondary | ICD-10-CM

## 2019-04-27 DIAGNOSIS — K219 Gastro-esophageal reflux disease without esophagitis: Secondary | ICD-10-CM

## 2019-04-27 DIAGNOSIS — I10 Essential (primary) hypertension: Secondary | ICD-10-CM

## 2019-04-27 DIAGNOSIS — E1169 Type 2 diabetes mellitus with other specified complication: Secondary | ICD-10-CM

## 2019-04-27 DIAGNOSIS — F419 Anxiety disorder, unspecified: Secondary | ICD-10-CM

## 2019-04-27 DIAGNOSIS — E1159 Type 2 diabetes mellitus with other circulatory complications: Secondary | ICD-10-CM

## 2019-04-27 DIAGNOSIS — E785 Hyperlipidemia, unspecified: Secondary | ICD-10-CM

## 2019-04-27 NOTE — Patient Instructions (Signed)
Licensed Clinical Social Worker Visit Information  Goals we discussed today:  Goals Addressed            This Visit's Progress   . Client stated: "I want to work on my anxiety symptoms and managing anxiety" (pt-stated)       Current Barriers:  . Financial constraints . Mental Health Concerns   . Food procurement challenges . Social isolation  Clinical Social Work Clinical Goal(s):  [name] Over the next 30 days, client will work with SW to address concerns related to anxiety and anxiety management for client  Interventions:   Provided counseling support for client  Talked with client about current client needs  Talked with client about medical needs of a family member  Self Care Activities: Attends scheduled provider appointments Self administers prescribed medications Perform ADLs  Plan: Client to attend scheduled medical appointments Client to communicate with RN CM to discuss nursing needs of client LCSW to call client in 3 weeks to discuss anxiety symptoms of client and client management of anxiety symptoms  Please see past updates related to this goal by clicking on the "Past Updates" button in the selected goal      Materials Provided: No  Follow Up Plan: LCSW to call client in next 3 weeks to discuss anxiety symptoms of client and client management of anxiety symptoms  The patient verbalized understanding of instructions provided today and declined a print copy of patient instruction materials.   [name].Kinlie Janice MSW, LCSW Licensed Clinical Social Worker [facility] Care Management [phone]

## 2019-04-27 NOTE — Telephone Encounter (Signed)
Form started and placed on Christys desk, pt aware to pick up Thursday 04/29/19.

## 2019-04-27 NOTE — Chronic Care Management (AMB) (Addendum)
  Care Management Note   [name] is a 85 y.o. year old male who is a primary care patient of Dale Balloon, FNP. The CM team was consulted for assistance with chronic disease management and care coordination.   I reached out to [name] by phone today.  Review of patient status, including review of consultants reports, relevant laboratory and other test results, and collaboration with appropriate care team members and the patient's provider was performed as part of comprehensive patient evaluation and provision of chronic care management services.   Goals Addressed            This Visit's Progress   . Client stated: "I want to work on my anxiety symptoms and managing anxiety" (pt-stated)       Current Barriers:  . Financial constraints . Mental Health Concerns   . Food procurement challenges . Social isolation  Clinical Social Work Clinical Goal(s):  [name] Kitchen Over the next 30 days, client will work with SW to address concerns related to anxiety and anxiety management for client  Interventions:   Provided counseling support for client  Talked with client about current client needs  Talked with client about medical needs of a family member  Self Care Activities: Attends scheduled provider appointments Self administers prescribed medications Perform ADLs  Plan: Client to attend scheduled medical appointments Client to communicate with RN CM to discuss nursing needs of client LCSW to call client in 3 weeks to discuss anxiety symptoms of client and client management of anxiety symptoms  Please see past updates related to this goal by clicking on the "Past Updates" button in the selected goal     Follow Up Plan: LCSW to call client in next 3 weeks to discuss anxiety symptoms of client and client management of anxiety symptoms  [name].[name] MSW, LCSW Licensed Clinical Social Worker [facility] Care Management [phone]  I have  reviewed and agree with the above  documentation.   Dale Dun, FNP

## 2019-04-29 ENCOUNTER — Other Ambulatory Visit: Payer: Self-pay | Admitting: Family

## 2019-04-30 ENCOUNTER — Telehealth: Payer: Medicare Other | Admitting: *Deleted

## 2019-05-01 ENCOUNTER — Other Ambulatory Visit: Payer: Self-pay | Admitting: Family

## 2019-05-01 DIAGNOSIS — R609 Edema, unspecified: Secondary | ICD-10-CM

## 2019-05-01 DIAGNOSIS — I10 Essential (primary) hypertension: Secondary | ICD-10-CM

## 2019-05-03 ENCOUNTER — Telehealth: Payer: Self-pay | Admitting: Family

## 2019-05-03 ENCOUNTER — Ambulatory Visit (INDEPENDENT_AMBULATORY_CARE_PROVIDER_SITE_OTHER): Payer: Medicare Other | Admitting: Licensed Clinical Social Worker

## 2019-05-03 DIAGNOSIS — F419 Anxiety disorder, unspecified: Secondary | ICD-10-CM

## 2019-05-03 DIAGNOSIS — E1159 Type 2 diabetes mellitus with other circulatory complications: Secondary | ICD-10-CM

## 2019-05-03 DIAGNOSIS — I10 Essential (primary) hypertension: Secondary | ICD-10-CM | POA: Diagnosis not present

## 2019-05-03 DIAGNOSIS — K219 Gastro-esophageal reflux disease without esophagitis: Secondary | ICD-10-CM

## 2019-05-03 DIAGNOSIS — E1169 Type 2 diabetes mellitus with other specified complication: Secondary | ICD-10-CM | POA: Diagnosis not present

## 2019-05-03 DIAGNOSIS — E119 Type 2 diabetes mellitus without complications: Secondary | ICD-10-CM | POA: Diagnosis not present

## 2019-05-03 DIAGNOSIS — E785 Hyperlipidemia, unspecified: Secondary | ICD-10-CM | POA: Diagnosis not present
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
## 2019-05-03 NOTE — Telephone Encounter (Signed)
PT has called this morning states that he found his other handicap placard and it still has a year left on it and we can disregard the one he called back on 4/28 about

## 2019-05-03 NOTE — Patient Instructions (Signed)
Licensed Clinical Social Worker Visit Information  Goals we discussed today:  Goals Addressed            This Visit's Progress   . Client stated: "I want to work on my anxiety symptoms and managing anxiety" (pt-stated)       Current Barriers:  . Financial constraints . Mental Health Concerns   . Food procurement challenges . Social isolation  Clinical Social Work Clinical Goal(s):  [name] Over the next 30 days, client will work with LCSW to address concerns related to anxiety and anxiety management for client  Interventions:     Provided counseling support for client   Talked with client about current client needs       Talked with client about medical needs of a family member     Talked with client about medication costs issues for client    Self Care Activities: Attends scheduled provider appointments Self administers prescribed medications Perform ADLs  Plan: Client to attend scheduled medical appointments Client to communicate with RN CM to discuss nursing needs of client LCSW to call client in 3 weeks to discuss anxiety symptoms of client and client management of anxiety symptoms  Please see past updates related to this goal by clicking on the "Past Updates" button in the selected goal      Materials Provided: No  Follow Up Plan: LCSW to call client in next 3 weeks to discuss anxiety symptoms of client and client  management of anxiety symptoms.  The patient verbalized understanding of instructions provided today and declined a print copy of patient instruction materials.   [name].Naim Murtha MSW, LCSW Licensed Clinical Social Worker [facility] Care Management [phone]

## 2019-05-04 ENCOUNTER — Ambulatory Visit: Payer: Medicare Other | Admitting: *Deleted

## 2019-05-04 DIAGNOSIS — M545 Low back pain, unspecified: Secondary | ICD-10-CM

## 2019-05-04 DIAGNOSIS — G8929 Other chronic pain: Secondary | ICD-10-CM

## 2019-05-04 DIAGNOSIS — F419 Anxiety disorder, unspecified: Secondary | ICD-10-CM
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
## 2019-05-04 NOTE — Patient Instructions (Signed)
Visit Information  Goals Addressed            This Visit's Progress     Patient Stated   . "I want my back and head pain to get better" (pt-stated)       Current Barriers:  . Lacks caregiver support.  . Film/video editor.  . Transportation barriers . Difficulty obtaining medications  Nurse Case Manager Clinical Goal(s):  [name] Over the next 30 days, patient will verbalize understanding of plan for treatment of baack pain. . Over the next 30 days, patient will work with RN Case Freight forwarder and PCP to address needs related to back pain.  Interventions:  . Advised patient to discuss tylenol use with PCP at visit tomorrow since he has a hx of elev LFTs . Reviewed medications with patient and discussed previously used medications for back pain: oxycodone and ibuprofen . Discussed plans with patient for ongoing care management follow up and provided patient with direct contact information for care management team  Patient Self Care Activities:  . Self administers medications as prescribed . Calls pharmacy for medication refills . Attends church or other social activities  Initial goal documentation        Plan  Keep f/u with PCP   Discuss Tylenol use with PCP in light of elevated LFTs. May be able to tolerate a small infrequent amount if it is that helpful for head pain.   Advised that I will collaborate with PCP after visit tomorrow  I will f/u with him by telephone next week and we can discuss the PCP visit and what he'd like to do to manage the pain.    [name], RN-BC, BSN Nurse Care Manager [facility] [phone]  The patient verbalized understanding of instructions provided today and declined a print copy of patient instruction materials.

## 2019-05-05 ENCOUNTER — Encounter: Payer: Self-pay | Admitting: Family

## 2019-05-05 ENCOUNTER — Other Ambulatory Visit: Payer: Self-pay

## 2019-05-05 ENCOUNTER — Ambulatory Visit (INDEPENDENT_AMBULATORY_CARE_PROVIDER_SITE_OTHER): Payer: Medicare Other | Admitting: Family

## 2019-05-05 ENCOUNTER — Telehealth: Payer: Self-pay | Admitting: *Deleted

## 2019-05-05 DIAGNOSIS — F419 Anxiety disorder, unspecified: Secondary | ICD-10-CM | POA: Diagnosis not present

## 2019-05-05 DIAGNOSIS — G8929 Other chronic pain: Secondary | ICD-10-CM

## 2019-05-05 DIAGNOSIS — M545 Low back pain, unspecified: Secondary | ICD-10-CM

## 2019-05-05 MED ORDER — BACLOFEN 10 MG PO TABS: 5.0000 mg | ORAL_TABLET | Freq: Three times a day (TID) | ORAL | 0 refills | Status: DC

## 2019-05-05 NOTE — Telephone Encounter (Signed)
He has had baclofen before and it does not help his pain.  Is there anything else you can give him to take?

## 2019-05-05 NOTE — Telephone Encounter (Signed)
Pt notified and states understanding

## 2019-05-05 NOTE — Telephone Encounter (Signed)
I am sorry, but can't prescribe anything stronger. I recommend he follow up with his Ortho provider and may can get an injection.

## 2019-05-05 NOTE — Progress Notes (Signed)
   Virtual Visit via telephone Note  I connected with Braedyn Segal on 05/05/19 at 9:03 AM by telephone and verified that I am speaking with the correct person using two identifiers. [name] is currently located at home and no one is currently with her during visit. The provider, [name], FNP is located in their office at time of visit.  I discussed the limitations, risks, security and privacy concerns of performing an evaluation and management service by telephone and the availability of in person appointments. I also discussed with the patient that there may be a patient responsible charge related to this service. The patient expressed understanding and agreed to proceed.   History and Present Illness:  Back Pain  This is a chronic problem. The current episode started more than 1 year ago. The problem occurs intermittently. The problem has been waxing and waning since onset. The pain is present in the lumbar spine and gluteal. The quality of the pain is described as aching. The pain is at a severity of 8/10. The pain is mild. The symptoms are aggravated by bending and twisting. Associated symptoms include headaches. Pertinent negatives include no bladder incontinence, bowel incontinence, dysuria or weakness. Risk factors include obesity. He has tried bed rest (tylenol ) for the symptoms.  Anxiety  Presents for follow-up visit. Symptoms include depressed mood, excessive worry, irritability, nervous/anxious behavior and restlessness. Symptoms occur most days. The severity of symptoms is moderate.        Review of Systems  Constitutional: Positive for irritability.  Gastrointestinal: Negative for bowel incontinence.  Genitourinary: Negative for bladder incontinence and dysuria.  Musculoskeletal: Positive for back pain.  Neurological: Positive for headaches. Negative for weakness.  Psychiatric/Behavioral: The patient is nervous/anxious.   All other systems reviewed and are  negative.    Observations/Objective: No SOB or distress noted   Assessment and Plan: 1. Chronic midline low back pain, unspecified whether sciatica present Will try baclofen as needed Discuss sedation precautions  Do not take while driving or with Valium  May take Tylenol as needed May need to follow up with Ortho for injections RTO if symptoms worsen or do not improve  - baclofen (LIORESAL) 10 MG tablet; Take 0.5 tablets (5 mg total) by mouth 3 (three) times daily.  Dispense: 30 each; Refill: 0  2. Anxiety Over 15 mins spent discussing his anxiety and past medical problems.      I discussed the assessment and treatment plan with the patient. The patient was provided an opportunity to ask questions and all were answered. The patient agreed with the plan and demonstrated an understanding of the instructions.   The patient was advised to call back or seek an in-person evaluation if the symptoms worsen or if the condition fails to improve as anticipated.  The above assessment and management plan was discussed with the patient. The patient verbalized understanding of and has agreed to the management plan. Patient is aware to call the clinic if symptoms persist or worsen. Patient is aware when to return to the clinic for a follow-up visit. Patient educated on when it is appropriate to go to the emergency department.   Time call ended:  9:29 AM  I provided 26 minutes of non-face-to-face time during this encounter.    [name], FNP

## 2019-05-11 ENCOUNTER — Telehealth: Payer: Self-pay | Admitting: Family

## 2019-05-11 MED ORDER — INSULIN GLARGINE (2 UNIT DIAL) 300 UNIT/ML ~~LOC~~ SOPN: 10.0000 [IU] | PEN_INJECTOR | Freq: Every day | SUBCUTANEOUS | 2 refills | Status: DC

## 2019-05-11 NOTE — Telephone Encounter (Signed)
Prescription sent to pharmacy.

## 2019-05-11 NOTE — Telephone Encounter (Signed)
Patient aware.

## 2019-05-11 NOTE — Telephone Encounter (Signed)
PT is calling to see if we have sampels of toujeo, if we dont have samples wants to know if we can write rx for it, said he might be able to get enough money together to get it filled.

## 2019-05-11 NOTE — Telephone Encounter (Signed)
Medication requesting is not on medication list - please advise

## 2019-05-17 ENCOUNTER — Other Ambulatory Visit: Payer: Self-pay | Admitting: Family

## 2019-05-17 DIAGNOSIS — M545 Low back pain, unspecified: Secondary | ICD-10-CM

## 2019-05-17 DIAGNOSIS — G8929 Other chronic pain: Secondary | ICD-10-CM

## 2019-05-18 ENCOUNTER — Other Ambulatory Visit: Payer: Self-pay | Admitting: Family

## 2019-05-21 ENCOUNTER — Ambulatory Visit: Payer: Self-pay | Admitting: Licensed Clinical Social Worker

## 2019-05-21 DIAGNOSIS — I152 Hypertension secondary to endocrine disorders: Secondary | ICD-10-CM

## 2019-05-21 DIAGNOSIS — E1169 Type 2 diabetes mellitus with other specified complication: Secondary | ICD-10-CM

## 2019-05-21 DIAGNOSIS — K219 Gastro-esophageal reflux disease without esophagitis: Secondary | ICD-10-CM

## 2019-05-21 DIAGNOSIS — F419 Anxiety disorder, unspecified: Secondary | ICD-10-CM

## 2019-05-21 DIAGNOSIS — E785 Hyperlipidemia, unspecified: Secondary | ICD-10-CM

## 2019-05-21 DIAGNOSIS — E119 Type 2 diabetes mellitus without complications: Secondary | ICD-10-CM

## 2019-05-21 DIAGNOSIS — E1159 Type 2 diabetes mellitus with other circulatory complications: Secondary | ICD-10-CM

## 2019-05-21 NOTE — Patient Instructions (Signed)
Licensed Clinical Social Worker Visit Information  Goals we discussed today:  Goals Addressed            This Visit's Progress   . Client stated: "I want to work on my anxiety symptoms and managing anxiety" (pt-stated)       Current Barriers:  [name] Mental Health issues . Financial challenges . Social isolation  Clinical Social Work Clinical Goal(s):  [name] Over the next 30 days, client will work with LCSW to address concerns related to anxiety and anxiety management for client  Interventions:   Talked with client about current client needs  Talked with client about medical needs of a family member  Talked with client about CCM program services support  Self Care Activities: Attends scheduled provider appointments Self administers prescribed medications Perform ADLs  Plan: Client to attend scheduled medical appointments Client to communicate with RN CM to discuss nursing needs of client LCSW to call client in 3 weeks to discuss anxiety symptoms of client and client management of anxiety symptoms  Please see past updates related to this goal by clicking on the "Past Updates" button in the selected goal     Materials Provided: No  Follow Up Plan: LCSW to call client in next 3 weeks to discuss anxiety symptoms of client and client management of anxiety symptoms   The patient verbalized understanding of instructions provided today and declined a print copy of patient instruction materials.   [name].Jayr Lupercio MSW, LCSW Licensed Clinical Social Worker [facility] Care Management [phone]

## 2019-05-21 NOTE — Chronic Care Management (AMB) (Addendum)
  Care Management Note   [name] is a 85 y.o. year old male who is a primary care patient of [name], FNP. The CM team was consulted for assistance with chronic disease management and care coordination.   I reached out to [facility] by phone today.   Review of patient status, including review of consultants reports, relevant laboratory and other test results, and collaboration with appropriate care team members and the patient's provider was performed as part of comprehensive patient evaluation and provision of chronic care management services.   Social Determinants of Health:Risk for Depression; Risk for Tobacco Exposure      Chronic Care Management from 02/18/2019 in [place]  PHQ-9 Total Score  8     GAD 7 : Generalized Anxiety Score 02/18/2019  Nervous, Anxious, on Edge 1  Control/stop worrying 1  Worry too much - different things 1  Trouble relaxing 1  Restless 0  Easily annoyed or irritable 0  Afraid - awful might happen 1  Total GAD 7 Score 5  Anxiety Difficulty Somewhat difficult    Goals Addressed            This Visit's Progress   . Client stated: "I want to work on my anxiety symptoms and managing anxiety" (pt-stated)       Current Barriers:  [name] Mental Health issues . Financial challenges . Social isolation  Clinical Social Work Clinical Goal(s):  [name] Over the next 30 days, client will work with LCSW to address concerns related to anxiety and anxiety management for client  Interventions:   Talked with client about current client needs  Talked with client about medical needs of a family member  Talked with client about CCM program support  Self Care Activities: Attends scheduled provider appointments Self administers prescribed medications Perform ADLs  Plan: Client to attend scheduled medical appointments Client to communicate with RN CM to discuss nursing needs of client LCSW to call client in 3 weeks to  discuss anxiety symptoms of client and client management of anxiety symptoms  Please see past updates related to this goal by clicking on the "Past Updates" button in the selected goal      Client spoke of back pain issues.He said he has prescribed medications and is taking medications as prescribed. Client spoke of his faith and importance of his faith He said he is not having any transport problems Client enjoys caring for his pet dog LCSW talked with client about CCM program support; LCSW encouraged client to call LCSW for psychosocial support LCSW encouraged client to call RNCM to discuss nursing needs of client.  Follow Up Plan: LCSW to call client in next 3 weeks to discuss anxiety symptoms of client and management of anxiety symptoms of client.    [name].Kiyoshi Schaab MSW, LCSW Licensed Clinical Social Worker [facility] Care Management [phone]  I have reviewed and agree with the above  documentation.   [name], FNP

## 2019-05-28 ENCOUNTER — Telehealth: Payer: Self-pay

## 2019-05-28 MED ORDER — LOSARTAN POTASSIUM 25 MG PO TABS: 25.0000 mg | ORAL_TABLET | Freq: Four times a day (QID) | ORAL | 0 refills | Status: DC

## 2019-05-28 NOTE — Telephone Encounter (Signed)
rx sent- patient aware and verbalizes understanding.

## 2019-05-28 NOTE — Telephone Encounter (Signed)
Pharmacy called and states that losartan 100mg  has been on back order and patient is refusing to go to another pharmacy. States that he would like [facility] else [facility]- only losartan that have in stock is 25.  Covering PCP please advise

## 2019-05-28 NOTE — Telephone Encounter (Signed)
Okay go ahead and send in losartan 25 mg tablets take 4 daily to equal 100 and give him a months worth Caryl Pina, MD [place] 05/28/2019, 2:55 PM

## 2019-05-29 ENCOUNTER — Other Ambulatory Visit: Payer: Self-pay | Admitting: Family Medicine

## 2019-05-31 ENCOUNTER — Other Ambulatory Visit: Payer: Self-pay | Admitting: Family

## 2019-05-31 ENCOUNTER — Telehealth: Payer: Self-pay | Admitting: *Deleted

## 2019-05-31 MED ORDER — OLMESARTAN MEDOXOMIL 20 MG PO TABS: 20.0000 mg | ORAL_TABLET | Freq: Every day | ORAL | 1 refills | Status: DC

## 2019-05-31 NOTE — Telephone Encounter (Signed)
Fax from [facility] [place] Losartan 25 mg 4 tabs qd to equal 100 mg daily Insurance will not cover 4 tabs qd Request from pharmacy: could he be changed to Olmesartan or another ARB Please advise

## 2019-05-31 NOTE — Telephone Encounter (Signed)
Please call patient and let him know his losartan was changed to Olmesartan because the Losartan is back ordered. He can not take these together as they are the same type of medication.

## 2019-06-01 ENCOUNTER — Telehealth: Payer: Self-pay | Admitting: Family

## 2019-06-01 DIAGNOSIS — M545 Low back pain, unspecified: Secondary | ICD-10-CM

## 2019-06-01 DIAGNOSIS — G8929 Other chronic pain: Secondary | ICD-10-CM

## 2019-06-01 MED ORDER — BACLOFEN 10 MG PO TABS: 5.0000 mg | ORAL_TABLET | Freq: Three times a day (TID) | ORAL | 0 refills | Status: DC

## 2019-06-01 NOTE — Telephone Encounter (Signed)
Date on "OLD" baclofen was 2017 - he was advised to not use that med and new refill was sent in for pt

## 2019-06-10 ENCOUNTER — Telehealth: Payer: Self-pay

## 2019-06-11 ENCOUNTER — Telehealth: Payer: Self-pay | Admitting: Family

## 2019-06-11 NOTE — Telephone Encounter (Signed)
Has apt with [facility] 6/16- please advise if NTBS or can do tele visit?

## 2019-06-14 ENCOUNTER — Ambulatory Visit (INDEPENDENT_AMBULATORY_CARE_PROVIDER_SITE_OTHER): Payer: Medicare Other | Admitting: Licensed Clinical Social Worker

## 2019-06-14 DIAGNOSIS — E785 Hyperlipidemia, unspecified: Secondary | ICD-10-CM | POA: Diagnosis not present

## 2019-06-14 DIAGNOSIS — E1169 Type 2 diabetes mellitus with other specified complication: Secondary | ICD-10-CM

## 2019-06-14 DIAGNOSIS — I10 Essential (primary) hypertension: Secondary | ICD-10-CM | POA: Diagnosis not present

## 2019-06-14 DIAGNOSIS — K219 Gastro-esophageal reflux disease without esophagitis: Secondary | ICD-10-CM

## 2019-06-14 DIAGNOSIS — E119 Type 2 diabetes mellitus without complications: Secondary | ICD-10-CM

## 2019-06-14 DIAGNOSIS — E1159 Type 2 diabetes mellitus with other circulatory complications: Secondary | ICD-10-CM | POA: Diagnosis not present

## 2019-06-14 DIAGNOSIS — F419 Anxiety disorder, unspecified: Secondary | ICD-10-CM

## 2019-06-14 NOTE — Chronic Care Management (AMB) (Addendum)
Chronic Care Management    Clinical Social Work CCM Outreach Note  06/14/2019 Name: [name] MRN: [id number] DOB: [birth year]  [name] is a 85 y.o. year old male who is a primary care patient of Junie Spencer, FNP . The CCM team was consulted for assistance with assessment of psychosocial needs.   LCSW reached out to [facility] today by phone .   Social Determinants of Health:Risk for Depression; risk for tobacco exposure    Chronic Care Management from 02/18/2019 in [facility]  PHQ-9 Total Score  8     GAD 7 : Generalized Anxiety Score 02/18/2019  Nervous, Anxious, on Edge 1  Control/stop worrying 1  Worry too much - different things 1  Trouble relaxing 1  Restless 0  Easily annoyed or irritable 0  Afraid - awful might happen 1  Total GAD 7 Score 5  Anxiety Difficulty Somewhat difficult              Goals Addressed                           . Client stated: "I want to work on my anxiety symptoms and managing anxiety" (pt-stated)        Current Barriers:   Mental Health issues  Financial challenges  Social isolation  Clinical Social Work Clinical Goal(s):   Over the next 30 days, client will work with LCSW to address concerns related to anxiety and anxiety management for client  Interventions:   Previously talked with client about current client needs  Previously talked with client about medical needs of a family member  Talked with client about CCM program support  LCSW has collaborated with [facility] regarding ongoing client needs  Self Care Activities: Attends scheduled provider appointments Self administers prescribed medications Perform ADLs  Plan: Client to attend scheduled medical appointments Client to communicate with RN CM to discuss nursing needs of client LCSW to call client in 3 weeks to discuss anxiety symptoms of client and client management of anxiety symptoms  Please see past  updates related to this goal by clicking on the "Past Updates" button in the selected goal      Client has previously spoken of back pain issues.He said he has prescribed medications and is taking medications as prescribed. Client spoke of his faith and importance of his faith He said he is not having any transport problems. LCSW talked with client about CCM program support; LCSW encouraged client to call LCSW for psychosocial support LCSW encouraged client to call RNCM to discuss nursing needs of client.   Follow Up Plan: LCSW to call client in next 3 weeks to talk with client about anxiety symptoms of client and client management of anxiety symptoms.   Kelton Pillar.Niraj Kudrna MSW, LCSW Licensed Clinical Social Worker [facility] Care Management [phone]  I have reviewed and agree with the above documentation.   Jannifer Rodney, FNP

## 2019-06-14 NOTE — Telephone Encounter (Signed)
Pt needs lab work. We can do telephone visit and he can come get his lab work prior or after visit.

## 2019-06-14 NOTE — Patient Instructions (Addendum)
Licensed Clinical Social Worker Visit Information  Materials Provided: No             Goals Addressed                           . Client stated: "I want to work on my anxiety symptoms and managing anxiety" (pt-stated)        Current Barriers:  Mental Health issues  Financial challenges  Social isolation  Clinical Social Work Clinical Goal(s):  Over the next 30 days, client will work with LCSW to address concerns related to anxiety and anxiety management for client  Interventions:   Previously talked with client about current client needs  Previously talked with client about medical needs of a family member  Talked with client about CCM program support  LCSW has collaborated with [facility] regarding ongoing client needs  Self Care Activities: Attends scheduled provider appointments Self administers prescribed medications Perform ADLs  Plan: Client to attend scheduled medical appointments Client to communicate with RN CM to discuss nursing needs of client LCSW to call client in 3 weeks to discuss anxiety symptoms of client and client management of anxiety symptoms  Please see past updates related to this goal by clicking on the "Past Updates" button in the selected goal      Client has previously spoken of back pain issues.He said he has prescribed medications and is taking medications as prescribed. Client spoke of his faith and importance of his faith He said he is not having any transport problems. LCSW talked with client about CCM program support; LCSW encouraged client to call LCSW for psychosocial support LCSW encouraged client to call RNCM to discuss nursing needs of client.   Follow Up Plan: LCSW to call client in next 3 weeks to talk with client about anxiety symptoms of client and client management of anxiety symptoms.   Patient verbalized understanding of patient instructions and declined a print copy of patient  instructions   [name].Tyshon Fanning MSW, LCSW Licensed Clinical Social Worker [facility] Care [facility] [phone]

## 2019-06-15 ENCOUNTER — Other Ambulatory Visit: Payer: Self-pay | Admitting: Family

## 2019-06-15 ENCOUNTER — Ambulatory Visit: Payer: Medicare Other | Admitting: Family

## 2019-06-16 ENCOUNTER — Other Ambulatory Visit: Payer: Self-pay | Admitting: Family

## 2019-06-16 DIAGNOSIS — G8929 Other chronic pain: Secondary | ICD-10-CM

## 2019-06-16 NOTE — Telephone Encounter (Signed)
No voice mail.

## 2019-06-18 ENCOUNTER — Other Ambulatory Visit: Payer: Self-pay | Admitting: Family

## 2019-06-21 ENCOUNTER — Telehealth: Payer: Self-pay | Admitting: Family

## 2019-06-21 NOTE — Telephone Encounter (Signed)
Attempted to contact patient - NVM 

## 2019-06-21 NOTE — Telephone Encounter (Signed)
This was why Chronic Care Management was working with him and was going to get his medications for free, but he did not want to do the paper work. I recommend he do this paperwork,(they can help him complete it).

## 2019-06-21 NOTE — Telephone Encounter (Signed)
Patient aware we have no samples- states he can not  afford any medicine and wants to know what Alyse Low wants him to do. Please advise

## 2019-06-21 NOTE — Telephone Encounter (Signed)
Patient aware and states he will make appointment with Alyse Low to talk about medication.

## 2019-06-22 ENCOUNTER — Ambulatory Visit: Payer: Self-pay | Admitting: Licensed Clinical Social Worker

## 2019-06-22 ENCOUNTER — Telehealth: Payer: Self-pay | Admitting: *Deleted

## 2019-06-22 DIAGNOSIS — Z794 Long term (current) use of insulin: Secondary | ICD-10-CM

## 2019-06-22 DIAGNOSIS — E1159 Type 2 diabetes mellitus with other circulatory complications: Secondary | ICD-10-CM

## 2019-06-22 DIAGNOSIS — E119 Type 2 diabetes mellitus without complications: Secondary | ICD-10-CM

## 2019-06-22 DIAGNOSIS — K219 Gastro-esophageal reflux disease without esophagitis: Secondary | ICD-10-CM

## 2019-06-22 DIAGNOSIS — E1169 Type 2 diabetes mellitus with other specified complication: Secondary | ICD-10-CM

## 2019-06-22 DIAGNOSIS — E785 Hyperlipidemia, unspecified: Secondary | ICD-10-CM

## 2019-06-22 DIAGNOSIS — F419 Anxiety disorder, unspecified: Secondary | ICD-10-CM

## 2019-06-22 NOTE — Telephone Encounter (Signed)
[name] can not afford his toujeo. He only has 2 days left please advise

## 2019-06-22 NOTE — Patient Instructions (Addendum)
Licensed Clinical Social Worker Visit Information  Materials Provided: No   LCSW met with client on 06/22/2019 at [facility] for face to face meeting. Client did not have an appointment at practice today but came in to discuss his medication needs. LCSW talked with client about his insurance, [facility]. LCSW and client called [employer] representative on 06/22/2019. Client gave verbal permission to [facility] representative for LCSW to talk with [facility] representative about client needs. Client asked [employer] representative about copay benefits with his insurance plan; he asked about podiatry and optometry copay benefits. Client had his insurance questions answered by [facility] representative. Client then talked about his insulin prescription. Client had a [facility] [place] , [place]; however, client did not have money to pay for prescription. LCSW talked with client about his working with [facility] [name] to look into application for Extra Help support for medication cost. Client was aggitated, angry, verbally accusatory to some staff members. LCSW and client met with  Triage nurse at [facility]. Triage nurse said she would consult with [name], FNP to inform Alyse Low of above information and see if [name] had any recommendations related to client insulin procurement and dose. Client agreed to this plan and left practice.Triage Nurse informed Justn that she would talk today with [name] about client needs regarding insulin procurement and dose.   Follow Up Plan: LCSW to call client as scheduled to assess client needs at that time.  The patient verbalized understanding of instructions provided today and declined a print copy of patient instruction materials.   [name].Rakeb Kibble MSW, LCSW Licensed Clinical Social Worker [facility] Care [facility] [phone]

## 2019-06-22 NOTE — Chronic Care Management (AMB) (Addendum)
Chronic Care Management    Clinical Social Work CCM Outreach Note  06/22/2019 Name: [name] MRN: [id number] DOB: Mar 22, 1933  [name] is a 85 y.o. year old male who is a primary care patient of Junie Spencer, FNP . The CCM team was consulted for assistance with assessment of psychosocial needs    LCSW met with client on 06/22/2019 at [facility] for face to face meeting. Client did not have an appointment at practice today but came in to discuss his medication needs. LCSW talked with client about his insurance, [facility]. LCSW and client called [employer] representative on 06/22/2019. Client gave verbal permission to [facility] representative for LCSW to talk with [facility] representative about client needs. Client asked [employer] representative about copay benefits with his insurance plan; he asked about podiatry and optometry copay benefits. Client had his insurance questions answered by [facility] representative. Client then talked about his insulin prescription. [facility] [place] , [place]; however, client did not have money to pay for prescription. LCSW talked with client about his working with [facility] Demetrios Loll to look into application for Extra Help support for medication cost. Client was aggitated, angry, verbally accusatory to some staff members. LCSW and client met with  Triage nurse at [facility]. Triage nurse said she would consult with Jannifer Rodney, FNP to inform Neysa Bonito of above information and see if Jannifer Rodney had any recommendations related to client insulin procurement and dose. Client agreed to this plan and left practice.Triage Nurse informed Daqwon that she would talk today with Jannifer Rodney about client needs regarding insulin procurement and dose.   Follow Up Plan:LCSW to call client as scheduled to assess client needs at that time.   Kelton Pillar.Joselynn Amoroso MSW, LCSW Licensed Clinical Social Worker [facility] Care [facility] [phone]   I have reviewed and agree with the above documentation.   Jannifer Rodney, FNP

## 2019-06-24 NOTE — Telephone Encounter (Signed)
Attempted to contact patient - NVM 

## 2019-06-24 NOTE — Telephone Encounter (Signed)
I have placed a referral to Endocrinologists. All long acting insulin will too expensive. He needs to follow up with CCM to help with medication assistance.

## 2019-06-28 ENCOUNTER — Telehealth: Payer: Self-pay | Admitting: Family

## 2019-06-28 NOTE — Telephone Encounter (Signed)
No answer or voice mail at number called.  Patient should go to health department for assistance with medications.

## 2019-06-30 ENCOUNTER — Other Ambulatory Visit: Payer: Self-pay

## 2019-07-01 ENCOUNTER — Other Ambulatory Visit: Payer: Self-pay

## 2019-07-02 ENCOUNTER — Encounter: Payer: Self-pay | Admitting: Family

## 2019-07-02 ENCOUNTER — Ambulatory Visit (INDEPENDENT_AMBULATORY_CARE_PROVIDER_SITE_OTHER): Payer: Medicare Other | Admitting: Family

## 2019-07-02 VITALS — BP 145/75 | HR 81 | Temp 97.8°F | Ht 70.0 in | Wt 224.0 lb

## 2019-07-02 DIAGNOSIS — G40909 Epilepsy, unspecified, not intractable, without status epilepticus: Secondary | ICD-10-CM

## 2019-07-02 DIAGNOSIS — E119 Type 2 diabetes mellitus without complications: Secondary | ICD-10-CM | POA: Diagnosis not present

## 2019-07-02 DIAGNOSIS — E559 Vitamin D deficiency, unspecified: Secondary | ICD-10-CM

## 2019-07-02 DIAGNOSIS — K219 Gastro-esophageal reflux disease without esophagitis: Secondary | ICD-10-CM | POA: Diagnosis not present

## 2019-07-02 DIAGNOSIS — F419 Anxiety disorder, unspecified: Secondary | ICD-10-CM

## 2019-07-02 DIAGNOSIS — E785 Hyperlipidemia, unspecified: Secondary | ICD-10-CM

## 2019-07-02 DIAGNOSIS — G8929 Other chronic pain: Secondary | ICD-10-CM

## 2019-07-02 DIAGNOSIS — E1159 Type 2 diabetes mellitus with other circulatory complications: Secondary | ICD-10-CM | POA: Diagnosis not present

## 2019-07-02 DIAGNOSIS — E1169 Type 2 diabetes mellitus with other specified complication: Secondary | ICD-10-CM | POA: Diagnosis not present

## 2019-07-02 DIAGNOSIS — F1320 Sedative, hypnotic or anxiolytic dependence, uncomplicated: Secondary | ICD-10-CM

## 2019-07-02 DIAGNOSIS — Z79899 Other long term (current) drug therapy: Secondary | ICD-10-CM

## 2019-07-02 DIAGNOSIS — R351 Nocturia: Secondary | ICD-10-CM

## 2019-07-02 DIAGNOSIS — R600 Localized edema: Secondary | ICD-10-CM

## 2019-07-02 DIAGNOSIS — Z794 Long term (current) use of insulin: Secondary | ICD-10-CM

## 2019-07-02 DIAGNOSIS — I10 Essential (primary) hypertension: Secondary | ICD-10-CM

## 2019-07-02 DIAGNOSIS — I152 Hypertension secondary to endocrine disorders: Secondary | ICD-10-CM

## 2019-07-02 DIAGNOSIS — R609 Edema, unspecified: Secondary | ICD-10-CM

## 2019-07-02 DIAGNOSIS — N401 Enlarged prostate with lower urinary tract symptoms: Secondary | ICD-10-CM

## 2019-07-02 DIAGNOSIS — M545 Low back pain: Secondary | ICD-10-CM

## 2019-07-02 MED ORDER — DULOXETINE HCL 60 MG PO CPEP: 60.0000 mg | ORAL_CAPSULE | Freq: Every day | ORAL | 3 refills | Status: DC

## 2019-07-02 MED ORDER — MELOXICAM 7.5 MG PO TABS: 7.5000 mg | ORAL_TABLET | Freq: Every day | ORAL | 3 refills | Status: DC

## 2019-07-02 MED ORDER — DIAZEPAM 5 MG PO TABS: ORAL_TABLET | ORAL | 2 refills | Status: DC

## 2019-07-02 MED ORDER — INSULIN LISPRO (1 UNIT DIAL) 100 UNIT/ML (KWIKPEN): 5.0000 [IU] | PEN_INJECTOR | Freq: Three times a day (TID) | SUBCUTANEOUS | 11 refills | Status: DC

## 2019-07-02 MED ORDER — AMLODIPINE BESYLATE 10 MG PO TABS: 10.0000 mg | ORAL_TABLET | Freq: Every day | ORAL | 3 refills | Status: DC

## 2019-07-02 MED ORDER — OLMESARTAN MEDOXOMIL 20 MG PO TABS: 20.0000 mg | ORAL_TABLET | Freq: Every day | ORAL | 3 refills | Status: DC

## 2019-07-02 MED ORDER — ONETOUCH DELICA LANCETS 33G MISC: 3 refills | Status: DC

## 2019-07-02 MED ORDER — TOUJEO MAX SOLOSTAR 300 UNIT/ML ~~LOC~~ SOPN: 10.0000 [IU] | PEN_INJECTOR | Freq: Every day | SUBCUTANEOUS | 3 refills | Status: DC

## 2019-07-02 MED ORDER — FUROSEMIDE 20 MG PO TABS: ORAL_TABLET | ORAL | 3 refills | Status: DC

## 2019-07-02 MED ORDER — BACLOFEN 10 MG PO TABS: 5.0000 mg | ORAL_TABLET | Freq: Three times a day (TID) | ORAL | 3 refills | Status: DC

## 2019-07-02 MED ORDER — ATORVASTATIN CALCIUM 20 MG PO TABS: 20.0000 mg | ORAL_TABLET | Freq: Every day | ORAL | 3 refills | Status: DC

## 2019-07-02 MED ORDER — METFORMIN HCL ER 500 MG PO TB24: 500.0000 mg | ORAL_TABLET | Freq: Three times a day (TID) | ORAL | 3 refills | Status: DC

## 2019-07-02 MED ORDER — TAMSULOSIN HCL 0.4 MG PO CAPS: 0.8000 mg | ORAL_CAPSULE | Freq: Every day | ORAL | 3 refills | Status: DC

## 2019-07-02 MED ORDER — FINASTERIDE 5 MG PO TABS: 5.0000 mg | ORAL_TABLET | Freq: Every day | ORAL | 3 refills | Status: DC

## 2019-07-02 MED ORDER — VITAMIN D (ERGOCALCIFEROL) 1.25 MG (50000 UNIT) PO CAPS: ORAL_CAPSULE | ORAL | 3 refills | Status: DC

## 2019-07-02 MED ORDER — ULTICARE MICRO PEN NEEDLES 32G X 4 MM MISC: 10 refills | Status: DC
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
## 2019-07-02 NOTE — Patient Instructions (Signed)

## 2019-07-06 ENCOUNTER — Ambulatory Visit (INDEPENDENT_AMBULATORY_CARE_PROVIDER_SITE_OTHER): Payer: Medicare Other | Admitting: Licensed Clinical Social Worker

## 2019-07-06 ENCOUNTER — Encounter: Payer: Self-pay | Admitting: Licensed Clinical Social Worker

## 2019-07-06 DIAGNOSIS — E1169 Type 2 diabetes mellitus with other specified complication: Secondary | ICD-10-CM | POA: Diagnosis not present

## 2019-07-06 DIAGNOSIS — K219 Gastro-esophageal reflux disease without esophagitis: Secondary | ICD-10-CM

## 2019-07-06 DIAGNOSIS — E119 Type 2 diabetes mellitus without complications: Secondary | ICD-10-CM | POA: Diagnosis not present

## 2019-07-06 DIAGNOSIS — E785 Hyperlipidemia, unspecified: Secondary | ICD-10-CM

## 2019-07-06 DIAGNOSIS — E1159 Type 2 diabetes mellitus with other circulatory complications: Secondary | ICD-10-CM | POA: Diagnosis not present

## 2019-07-06 DIAGNOSIS — I10 Essential (primary) hypertension: Secondary | ICD-10-CM

## 2019-07-06 DIAGNOSIS — F419 Anxiety disorder, unspecified: Secondary | ICD-10-CM

## 2019-07-06 DIAGNOSIS — Z794 Long term (current) use of insulin: Secondary | ICD-10-CM | POA: Diagnosis not present

## 2019-07-06 DIAGNOSIS — I152 Hypertension secondary to endocrine disorders: Secondary | ICD-10-CM

## 2019-07-06 NOTE — Chronic Care Management (AMB) (Addendum)
  Care Management Note   [name] is a 85 y.o. year old male who is a primary care patient of [name], FNP. The CM team was consulted for assistance with chronic disease management and care coordination.   I reached out to [facility] by phone today.   Review of patient status, including review of consultants reports, relevant laboratory and other test results, and collaboration with appropriate care team members and the patient's provider was performed as part of comprehensive patient evaluation and provision of chronic care management services.    Social Determinants of Health:Risk of tobacco use; Socially isolated    Chronic Care Management from 02/18/2019 in [place]  PHQ-9 Total Score  8     GAD 7 : Generalized Anxiety Score 02/18/2019  Nervous, Anxious, on Edge 1  Control/stop worrying 1  Worry too much - different things 1  Trouble relaxing 1  Restless 0  Easily annoyed or irritable 0  Afraid - awful might happen 1  Total GAD 7 Score 5  Anxiety Difficulty Somewhat difficult   Goals Addressed            This Visit's Progress   . Client stated: "I want to work on my anxiety symptoms and managing anxiety" (pt-stated)       Current Barriers:  [name] Mental Health issues . Financial challenges . Social isolation  Clinical Social Work Clinical Goal(s):  [name] Over the next 30 days, client will work with LCSW to address concerns related to anxiety and anxiety management for client  Interventions:   Talked with client about current client needs  Talked with client about medication procurement for client  Self Care Activities: Attends scheduled provider appointments Self administers prescribed medications Perform ADLs  Plan: Client to attend scheduled medical appointments Client to communicate with RN CM to discuss nursing needs of client LCSW to call client in 3 weeks to discuss anxiety symptoms of client and client management of  anxiety symptoms  Please see past updates related to this goal by clicking on the "Past Updates" button in the selected goal      Client has talked with CCM staff about obtaining his prescribed medications. Client has reported some financial challenges. [name] FNP recently completed form for client for possible medication assistance/support. Client said he had gone to pharmacy recently and had purchased a pen for insulin dosage for client . LCSW has talked with client about social support network for client. Client is at risk for social isolation. Client does not have any transport needs. Client has prescribed medications and is taking medications as prescribed. LCSW tried to talk with client about social work needs and medication needs of client; however, client kept changing subject saying that he was fine and that he had his medication and was ok. Client spoke of the importance of his faith and how he believed others did not have enough faith.  LCSW has encouraged client to call LCSW for social work support. LCSW has encouraged client to call RNCM to talk with RNCM about nursing needs of client   Follow Up Plan: LCSW to call client in next 3 weeks to discuss anxiety symptoms of client and management of anxiety symptoms  [name].Wilkin Lippy MSW, LCSW Licensed Clinical Social Worker [facility] Care Management [phone]   I have reviewed and agree with the above documentation.   [name], FNP

## 2019-07-06 NOTE — Patient Instructions (Signed)
Licensed Clinical Social Worker Visit Information  Goals we discussed today:  Goals Addressed            This Visit's Progress   . Client stated: "I want to work on my anxiety symptoms and managing anxiety" (pt-stated)       Current Barriers:  [name] Mental Health issues . Financial challenges . Social isolation  Clinical Social Work Clinical Goal(s):  [name] Over the next 30 days, client will work with LCSW to address concerns related to anxiety and anxiety management for client  Interventions:    Talked with client about current client needs  Talked with client about medication procurement for client  Self Care Activities: Attends scheduled provider appointments Self administers prescribed medications Perform ADLs  Plan: Client to attend scheduled medical appointments Client to communicate with RN CM to discuss nursing needs of client LCSW to call client in 3 weeks to discuss anxiety symptoms of client and client management of anxiety symptoms  Please see past updates related to this goal by clicking on the "Past Updates" button in the selected goal         Materials Provided: No  Follow Up Plan: LCSW to call client in next 3 weeks to discuss anxiety symptoms of client and client management of anxiety symptoms  The patient verbalized understanding of instructions provided today and declined a print copy of patient instruction materials.    [name].Leily Capek MSW, LCSW Licensed Clinical Social Worker [facility] Care Management [phone]

## 2019-07-09 ENCOUNTER — Telehealth: Payer: Self-pay | Admitting: Family

## 2019-07-09 NOTE — Telephone Encounter (Signed)
[name] health department will be handling his medicine assistance.  Gave patient their number [phone].

## 2019-07-16 ENCOUNTER — Telehealth: Payer: Self-pay | Admitting: Family

## 2019-07-16 DIAGNOSIS — E559 Vitamin D deficiency, unspecified: Secondary | ICD-10-CM

## 2019-07-16 MED ORDER — VITAMIN D (ERGOCALCIFEROL) 1.25 MG (50000 UNIT) PO CAPS: ORAL_CAPSULE | ORAL | 3 refills | Status: DC

## 2019-07-16 NOTE — Telephone Encounter (Signed)
Re- sent to [facility] - printed the first time.

## 2019-07-18 ENCOUNTER — Other Ambulatory Visit: Payer: Self-pay | Admitting: Family

## 2019-07-18 DIAGNOSIS — E1159 Type 2 diabetes mellitus with other circulatory complications: Secondary | ICD-10-CM

## 2019-07-22 ENCOUNTER — Telehealth: Payer: Self-pay | Admitting: Family

## 2019-07-22 NOTE — Telephone Encounter (Signed)
Aware. He can purchase over the counter vitamin D .

## 2019-07-27 ENCOUNTER — Ambulatory Visit: Payer: Self-pay | Admitting: *Deleted

## 2019-07-27 ENCOUNTER — Ambulatory Visit: Payer: Self-pay | Admitting: Licensed Clinical Social Worker

## 2019-07-27 DIAGNOSIS — Z794 Long term (current) use of insulin: Secondary | ICD-10-CM

## 2019-07-27 DIAGNOSIS — E1159 Type 2 diabetes mellitus with other circulatory complications: Secondary | ICD-10-CM

## 2019-07-27 DIAGNOSIS — E119 Type 2 diabetes mellitus without complications: Secondary | ICD-10-CM

## 2019-07-27 DIAGNOSIS — K219 Gastro-esophageal reflux disease without esophagitis: Secondary | ICD-10-CM

## 2019-07-27 DIAGNOSIS — I152 Hypertension secondary to endocrine disorders: Secondary | ICD-10-CM

## 2019-07-27 DIAGNOSIS — E1169 Type 2 diabetes mellitus with other specified complication: Secondary | ICD-10-CM

## 2019-07-27 DIAGNOSIS — E785 Hyperlipidemia, unspecified: Secondary | ICD-10-CM

## 2019-07-27 DIAGNOSIS — F419 Anxiety disorder, unspecified: Secondary | ICD-10-CM

## 2019-07-27 NOTE — Patient Instructions (Addendum)
Licensed Clinical Water engineer Provided: No   Client and LCSW spoke of client needs. Client was irritable on phone call and asked about his medications.  LCSW informed client that [name] FNP had completed form for client to request medication support from local [facility] Department:  Client said " I ain't heard nothing about my medicine. I have taken forms needed to Health Department and need my medicine". LCSW offered to give client number for Health Department for him to call and he said he already had number.  He did not agree to call Health Department to discuss medication issue. LCSW informed client that LCSW would convey above information to [facility] to see if RNCM could talk with Health Department representative regarding medication assistance for client. Client agreed to this plan.    Follow Up Plan: LCSW to call client in next 3 weeks to talk with client about psychosocial  needs of client  The patient verbalized understanding of instructions provided today and declined a print copy of patient instruction materials.   [name].Jakel Alphin MSW, LCSW Licensed Clinical Social Worker [facility] Care Management [phone]

## 2019-07-27 NOTE — Patient Instructions (Signed)
Visit Information  Goals Addressed            This Visit's Progress     Patient Stated   . "I need help getting my medication" (pt-stated)       Current Barriers:  [name] Knowledge Deficits related to prescription assistance programs . Lacks caregiver support.  . Film/video editor.   Nurse Case Manager Clinical Goal(s):  [name] Over the next 7 days, patient will verbalize understanding of plan for rx assistance . Over the next 14 days, patient will work with [facility] (community agency) to assist with prescription assistance application  Interventions:  . Collaborated with LCSW regarding his telephone call with patient earlier today . Collaborated with PCP regarding Rx Assistance paperwork and confirmed completion on their end . Left message for Hilda Blades with the [place] [place] [facility] to return my call regarding Mr Nih[place] application  Patient Self Care Activities:  . Performs ADL's independently . Performs IADL's independently  Initial goal documentation          The care management team will reach out to the patient again over the next 3 days.    [name] BSN, RN-BC Embedded Chronic Care Manager [facility] / [place] Management Direct Dial: [phone]

## 2019-07-27 NOTE — Chronic Care Management (AMB) (Addendum)
  Care Management Note   [name] is a 85 y.o. year old male who is a primary care patient of [name], FNP. The CM team was consulted for assistance with chronic disease management and care coordination.   I reached out to [facility] by phone today.     Review of patient status, including review of consultants reports, relevant laboratory and other test results, and collaboration with appropriate care team members and the patient's provider was performed as part of comprehensive patient evaluation and provision of chronic care management services.   Social Determinants of Health: risk of Social Isolation; risk of tobacco use    Chronic Care Management from 02/18/2019 in [place]  PHQ-9 Total Score  8     GAD 7 : Generalized Anxiety Score 02/18/2019  Nervous, Anxious, on Edge 1  Control/stop worrying 1  Worry too much - different things 1  Trouble relaxing 1  Restless 0  Easily annoyed or irritable 0  Afraid - awful might happen 1  Total GAD 7 Score 5  Anxiety Difficulty Somewhat difficult   Client and LCSW spoke of client needs. Client was irritable on phone call and asked about his medications.  LCSW informed client that [name] FNP had completed form for client to request medication support from local [facility] Department:  Client said " I ain't heard nothing about my medicine. I have taken forms needed to Health Department and need my medicine". LCSW offered to give client number for Health Department for him to call and he said he already had number.  He did not agree to call Health Department to discuss medication issue. LCSW informed client that LCSW would convey above information to [facility] to see if RNCM could talk with Health Department representative regarding medication assistance for client. Client agreed to this plan.    Follow Up Plan: LCSW to call client in next 3 weeks to talk with client about the psychosocial  needs of client.   [name].[name] MSW, LCSW Licensed Clinical Social Worker [facility] Care Management [phone]   I have reviewed and agree with the above  documentation.   [name], FNP

## 2019-07-27 NOTE — Chronic Care Management (AMB) (Addendum)
  Chronic Care Management   Collaboration Note  07/27/2019 Name: [name] MRN: [id number] DOB: May 24, 1933     Patient Stated   . "I need help getting my medication" (pt-stated)       Current Barriers:  [name] Knowledge Deficits related to prescription assistance programs . Lacks caregiver support.  . Film/video editor.   Nurse Case Manager Clinical Goal(s):  [name] Over the next 7 days, patient will verbalize understanding of plan for rx assistance . Over the next 14 days, patient will work with [facility] (community agency) to assist with prescription assistance application  Interventions:  . Collaborated with LCSW regarding his telephone call with patient earlier today . Collaborated with PCP regarding Rx Assistance paperwork and confirmed completion on their end . Left message for Hilda Blades with the [place] [place] [facility] to return my call regarding Mr [place] application  Patient Self Care Activities:  . Performs ADL's independently . Performs IADL's independently        Follow up plan: The care management team will reach out to the patient again over the next 3 days.   RNCM will f/u with Rx Assistance department over the next 2 days   [name] BSN, RN-BC [place] / [place] Management Direct Dial: [phone]   I have reviewed and agree with the above  documentation.   [name], FNP

## 2019-07-29 ENCOUNTER — Ambulatory Visit: Payer: Medicare Other | Admitting: *Deleted

## 2019-07-29 ENCOUNTER — Telehealth: Payer: Medicare Other

## 2019-07-29 DIAGNOSIS — E119 Type 2 diabetes mellitus without complications: Secondary | ICD-10-CM

## 2019-07-29 DIAGNOSIS — Z794 Long term (current) use of insulin: Secondary | ICD-10-CM

## 2019-07-29 DIAGNOSIS — F419 Anxiety disorder, unspecified: Secondary | ICD-10-CM

## 2019-07-29 DIAGNOSIS — E1159 Type 2 diabetes mellitus with other circulatory complications: Secondary | ICD-10-CM

## 2019-07-29 NOTE — Chronic Care Management (AMB) (Addendum)
  Chronic Care Management   Collaboration Note  07/29/2019 Name: [name] MRN: [id number] DOB: June 17, 1933  RN Care Manager attempting to confirm Rx Assistance Program's receipt of application.    [name] "I need help getting my medication" (pt-stated)       Current Barriers:  [name] Knowledge Deficits related to prescription assistance programs . Lacks caregiver support.  . Film/video editor.   Nurse Case Manager Clinical Goal(s):  [name] Over the next 7 days, patient will verbalize understanding of plan for rx assistance . Over the next 14 days, patient will work with [facility] (community agency) to assist with prescription assistance application  Interventions:  . Collaborated with LCSW regarding his telephone call with patient earlier today . Collaborated with PCP regarding Rx Assistance paperwork and confirmed completion on their end . Spoke with Jamelle Haring, LPN with PCP office and she believes the forms were mailed back last Friday . Left message for Hilda Blades with the [place] [place] [phone] to return my call regarding Mr R[place] application  Patient Self Care Activities:  . Performs ADL's independently . Performs IADL's independently  Please see past updates related to this goal by clicking on the "Past Updates" button in the selected goal          Follow up plan:  Await phone call from Elsa with [facility] Dept  Will attempt second call if no response within the next 2 days  RNCM will f/u with patient as needed   [name] BSN, RN-BC [place] / [place]: [phone]   I have reviewed and agree with the above documentation.   [name], FNP

## 2019-07-29 NOTE — Patient Instructions (Signed)
Visit Information  Goals Addressed            This Visit's Progress     Patient Stated   . "I need help getting my medication" (pt-stated)       Current Barriers:  [name] Knowledge Deficits related to prescription assistance programs . Lacks caregiver support.  . Film/video editor.   Nurse Case Manager Clinical Goal(s):  [name] Over the next 7 days, patient will verbalize understanding of plan for rx assistance . Over the next 14 days, patient will work with [facility] (community agency) to assist with prescription assistance application  Interventions:  . Collaborated with LCSW regarding his telephone call with patient earlier today . Collaborated with PCP regarding Rx Assistance paperwork and confirmed completion on their end . Spoke with Jamelle Haring, LPN with PCP office and she believes the forms were mailed back last Friday . Left message for Hilda Blades with the [place] [place] [phone] to return my call regarding Mr [place] application  Patient Self Care Activities:  . Performs ADL's independently . Performs IADL's independently  Please see past updates related to this goal by clicking on the "Past Updates" button in the selected goal         Follow up plan:  Await phone call from [place] with [facility] Dept  Will attempt second call if no response within the next 2 days  RNCM will f/u with patient as needed   [name] BSN, RN-BC [place] / [place]: [phone]

## 2019-08-02 ENCOUNTER — Ambulatory Visit: Payer: Self-pay | Admitting: Licensed Clinical Social Worker

## 2019-08-02 ENCOUNTER — Ambulatory Visit: Payer: Self-pay | Admitting: *Deleted

## 2019-08-02 DIAGNOSIS — E119 Type 2 diabetes mellitus without complications: Secondary | ICD-10-CM

## 2019-08-02 DIAGNOSIS — I152 Hypertension secondary to endocrine disorders: Secondary | ICD-10-CM

## 2019-08-02 DIAGNOSIS — E1169 Type 2 diabetes mellitus with other specified complication: Secondary | ICD-10-CM

## 2019-08-02 DIAGNOSIS — E1159 Type 2 diabetes mellitus with other circulatory complications: Secondary | ICD-10-CM

## 2019-08-02 DIAGNOSIS — F419 Anxiety disorder, unspecified: Secondary | ICD-10-CM

## 2019-08-02 DIAGNOSIS — E785 Hyperlipidemia, unspecified: Secondary | ICD-10-CM

## 2019-08-02 DIAGNOSIS — K219 Gastro-esophageal reflux disease without esophagitis: Secondary | ICD-10-CM

## 2019-08-02 DIAGNOSIS — Z794 Long term (current) use of insulin: Secondary | ICD-10-CM

## 2019-08-02 NOTE — Patient Instructions (Addendum)
Licensed Clinical Social Worker Visit Information  Materials Provided: No  LCSW spoke with client today via phone. [name] talked to LCSW about his past involvement in local church. [name] talked with LCSW about his financial challenges. He said he was taking diabetic medication as prescribed.  LCSW provided counseling support for client. LCSW talked with client about nursing support with RNCM.  Client talked about his faith.  LCSW has talked with client before about social support network for client.  Client is at risk for social isolation.  LCSW informed client that LCSW would call RNCM today to update RNCM regarding medication needs of client (per information shared with LCSW today by client). Client agreed to this plan.   Follow Up Plan: LCSW to call client in next 3 weeks to talk with client about social work needs of client  The patient verbalized understanding of instructions provided today and declined a print copy of patient instruction materials.   [name].Emmalise Huard MSW, LCSW Licensed Clinical Social Worker [facility] Care Management [phone]

## 2019-08-02 NOTE — Chronic Care Management (AMB) (Addendum)
  Chronic Care Management   Care Coordination Note  08/02/2019 Name: [name] MRN: [id number] DOB: July 10, 1933  Incoming call from Theadore Nan, [place] regarding patient's medications. Mr Leflore relayed that he had 3 meds that he could not pickup.   Goals Addressed            This Visit's Progress     Patient Stated   . "I need help getting my medication" (pt-stated)       Current Barriers:  [name] Knowledge Deficits related to prescription assistance programs . Lacks caregiver support.  . Film/video editor.   Nurse Case Manager Clinical Goal(s):  [name] Over the next 7 days, patient will verbalize understanding of plan for rx assistance . Over the next 14 days, patient will work with [facility] (community agency) to assist with prescription assistance application  Interventions:  . [facility]  o 3 meds remaining to be picked up o Toujeo $45, Furosemide $5, and amlodipine $5 . Patient prescription assistance through  [facility] still pending . Communicated update to Theadore Nan, LCSW who plans to advise patient on status . RNCM plans to follow up with patient over the next 4 days  Patient Self Care Activities:  . Performs ADL's independently . Performs IADL's independently  Please see past updates related to this goal by clicking on the "Past Updates" button in the selected goal          Follow up plan: The care management team will reach out to the patient again over the next 5 days.   [name] BSN, RN-BC Embedded Chronic Care Manager [facility] / [place] Management Direct Dial: [phone]    I have reviewed and agree with the above documentation.   [name], FNP

## 2019-08-02 NOTE — Chronic Care Management (AMB) (Addendum)
  Care Management Note   [name] is a 85 y.o. year old male who is a primary care patient of [name], FNP. The CM team was consulted for assistance with chronic disease management and care coordination.   I reached out to [facility] by phone today.   Review of patient status, including review of consultants reports, relevant laboratory and other test results, and collaboration with appropriate care team members and the patient's provider was performed as part of comprehensive patient evaluation and provision of chronic care management services.   Social Determinants of Health: risk of social isolation; risk of tobacco use    Chronic Care Management from 02/18/2019 in [place]  PHQ-9 Total Score  8     GAD 7 : Generalized Anxiety Score 02/18/2019  Nervous, Anxious, on Edge 1  Control/stop worrying 1  Worry too much - different things 1  Trouble relaxing 1  Restless 0  Easily annoyed or irritable 0  Afraid - awful might happen 1  Total GAD 7 Score 5  Anxiety Difficulty Somewhat difficult   LCSW spoke with client today via phone. Anibal talked to LCSW about his past involvement in local church. Yechezkel talked with LCSW about his financial challenges. He said he was taking diabetic medication as prescribed.  LCSW provided counseling support for client. LCSW talked with client about nursing support with RNCM.  Client talked about his faith.  LCSW has talked with client before about social support network for client.  Client is at risk for social isolation.  LCSW informed client that LCSW would call RNCM today to update RNCM regarding medication needs of client (per information shared with LCSW today by client). Client agreed to this plan.   Follow Up Plan: LCSW to call client in next 3 weeks to talk with client about social work needs of client  [name].Angelyna Henderson MSW, LCSW Licensed Clinical Social Worker [facility]  Care Management [phone]  I have reviewed and agree with the above  documentation.   [name], FNP

## 2019-08-02 NOTE — Patient Instructions (Signed)
Visit Information  Goals Addressed            This Visit's Progress     Patient Stated   . "I need help getting my medication" (pt-stated)       Current Barriers:  [name] Knowledge Deficits related to prescription assistance programs . Lacks caregiver support.  . Film/video editor.   Nurse Case Manager Clinical Goal(s):  [name] Over the next 7 days, patient will verbalize understanding of plan for rx assistance . Over the next 14 days, patient will work with [facility] (community agency) to assist with prescription assistance application  Interventions:  . [facility]  o 3 meds remaining to be picked up o Toujeo $45, Furosemide $5, and amlodipine $5 . Patient prescription assistance through  [facility] still pending . Communicated update to Theadore Nan, LCSW who plans to advise patient on status . RNCM plans to follow up with patient over the next 4 days  Patient Self Care Activities:  . Performs ADL's independently . Performs IADL's independently  Please see past updates related to this goal by clicking on the "Past Updates" button in the selected goal          The care management team will reach out to the patient again over the next 5 days.    [name] BSN, RN-BC Embedded Chronic Care Manager [facility] / [place] Management Direct Dial: [phone]

## 2019-08-05 ENCOUNTER — Ambulatory Visit: Payer: Medicare Other | Admitting: *Deleted

## 2019-08-05 DIAGNOSIS — E1159 Type 2 diabetes mellitus with other circulatory complications: Secondary | ICD-10-CM

## 2019-08-05 DIAGNOSIS — F419 Anxiety disorder, unspecified: Secondary | ICD-10-CM

## 2019-08-05 DIAGNOSIS — E119 Type 2 diabetes mellitus without complications: Secondary | ICD-10-CM

## 2019-08-05 NOTE — Chronic Care Management (AMB) (Addendum)
  Chronic Care Management    Care Coordination Note   08/05/2019 Name: Zael Shuman MRN: [id number] DOB: 1933-07-04  Referred by: [name], FNP Reason for referral : No chief complaint on file.   Nayan Proch is a 85 y.o. year old male who is a primary care patient of [name], FNP. The CCM team was consulted for assistance with chronic disease management and care coordination needs.    Mr Bogue is waiting on prescription assistance through the [facility] Dept.   Goals Addressed            This Visit's Progress     Patient Stated   . "I need help getting my medication" (pt-stated)       Current Barriers:  [name] Knowledge Deficits related to prescription assistance programs . Lacks caregiver support.  . Film/video editor.   Nurse Case Manager Clinical Goal(s):  [name] Over the next 7 days, patient will verbalize understanding of plan for rx assistance . Over the next 14 days, patient will work with [facility] (community agency) to assist with prescription assistance application  Interventions:  . Spoke with PCP regarding Rx Assistance paperwork. They are unable to locate it and can't verify at this time if it was mailed or faxed back to the Health Dept.  . [place] Dept to speak with Hilda Blades [phone] but she is out of the office until Monday.  [name] RNCM plans to call Hilda Blades on Monday to confirm receipt of paperwork or request that she fax it to the PCPs office again for signature   Patient Self Care Activities:  . Performs ADL's independently . Performs IADL's independently  Please see past updates related to this goal by clicking on the "Past Updates" button in the selected goal         Follow Up [place] will reach out to [place] with [facility] over the next 5 days and will follow-up with patient once I have a plan in place.    [name] BSN, RN-BC Embedded  Chronic Care Manager [facility] / [place] Management Direct Dial: [phone]   I have reviewed and agree with the above  documentation.   [name], FNP

## 2019-08-05 NOTE — Patient Instructions (Signed)
Visit Information  Goals Addressed            This Visit's Progress     Patient Stated   . "I need help getting my medication" (pt-stated)       Current Barriers:  [name] Knowledge Deficits related to prescription assistance programs . Lacks caregiver support.  . Film/video editor.   Nurse Case Manager Clinical Goal(s):  [name] Over the next 7 days, patient will verbalize understanding of plan for rx assistance . Over the next 14 days, patient will work with [facility] (community agency) to assist with prescription assistance application  Interventions:  . Spoke with PCP regarding Rx Assistance paperwork. They are unable to locate it and can't verify at this time if it was mailed or faxed back to the Health Dept.  . [place] Dept to speak with Hilda Blades [phone] but she is out of the office until Monday.  [name] RNCM plans to call Hilda Blades on Monday to confirm receipt of paperwork or request that she fax it to the PCPs office again for signature   Patient Self Care Activities:  . Performs ADL's independently . Performs IADL's independently  Please see past updates related to this goal by clicking on the "Past Updates" button in the selected goal         The patient verbalized understanding of instructions provided today and declined a print copy of patient instruction materials.   Follow Up Plan RNCM will reach out to [place] with [facility] over the next 5 days and will follow-up with patient once I have a plan in place.    [name] BSN, RN-BC Embedded Chronic Care Manager [facility] / [place] [facility] Direct Dial: [phone]

## 2019-08-06 ENCOUNTER — Ambulatory Visit: Payer: Self-pay | Admitting: Licensed Clinical Social Worker

## 2019-08-06 ENCOUNTER — Telehealth: Payer: Self-pay | Admitting: Family

## 2019-08-06 DIAGNOSIS — F419 Anxiety disorder, unspecified: Secondary | ICD-10-CM

## 2019-08-06 DIAGNOSIS — E1159 Type 2 diabetes mellitus with other circulatory complications: Secondary | ICD-10-CM

## 2019-08-06 DIAGNOSIS — Z794 Long term (current) use of insulin: Secondary | ICD-10-CM

## 2019-08-06 DIAGNOSIS — E1169 Type 2 diabetes mellitus with other specified complication: Secondary | ICD-10-CM

## 2019-08-06 DIAGNOSIS — E119 Type 2 diabetes mellitus without complications: Secondary | ICD-10-CM

## 2019-08-06 DIAGNOSIS — K219 Gastro-esophageal reflux disease without esophagitis: Secondary | ICD-10-CM

## 2019-08-06 DIAGNOSIS — E785 Hyperlipidemia, unspecified: Secondary | ICD-10-CM

## 2019-08-06 DIAGNOSIS — G8929 Other chronic pain: Secondary | ICD-10-CM

## 2019-08-06 DIAGNOSIS — I152 Hypertension secondary to endocrine disorders: Secondary | ICD-10-CM

## 2019-08-06 MED ORDER — DULOXETINE HCL 60 MG PO CPEP: 60.0000 mg | ORAL_CAPSULE | Freq: Every day | ORAL | 3 refills | Status: DC

## 2019-08-06 MED ORDER — INSULIN LISPRO (1 UNIT DIAL) 100 UNIT/ML (KWIKPEN): 5.0000 [IU] | PEN_INJECTOR | Freq: Three times a day (TID) | SUBCUTANEOUS | 11 refills | Status: DC

## 2019-08-06 NOTE — Telephone Encounter (Signed)
[facility]

## 2019-08-06 NOTE — Patient Instructions (Addendum)
Licensed Clinical Social Worker Visit Information  Materials Provided: No  LCSW spoke via phone with client today. LCSW talked with client about medication procurement. Client said he had still not been able to afford to pick up his prescribed medications at local pharmacy. LCSW informed [name] that RNCM [name] was in process of communicating with representative at local Health Department regarding possible medication assistance for client. Client was appreciative of call from LCSW on 08/06/2019  Follow Up Plan: LCSW to call client in next 3 weeks to assess the psychosocial needs of client at that time  The patient verbalized understanding of instructions provided today and declined a print copy of patient instruction materials.   [name].[name] MSW, LCSW Licensed Clinical Social Worker [facility] [phone]

## 2019-08-06 NOTE — Chronic Care Management (AMB) (Addendum)
  Care Management Note   [name] is a 85 y.o. year old male who is a primary care patient of Dale Balloon, FNP. The CM team was consulted for assistance with chronic disease management and care coordination.   I reached out to [facility] by phone today.   Review of patient status, including review of consultants reports, relevant laboratory and other test results, and collaboration with appropriate care team members and the patient's provider was performed as part of comprehensive patient evaluation and provision of chronic care management services.   Social Determinants of Health: Risk of social isolation; risk of tobacco use    Chronic Care Management from 02/18/2019 in [place]  PHQ-9 Total Score  8     GAD 7 : Generalized Anxiety Score 02/18/2019  Nervous, Anxious, on Edge 1  Control/stop worrying 1  Worry too much - different things 1  Trouble relaxing 1  Restless 0  Easily annoyed or irritable 0  Afraid - awful might happen 1  Total GAD 7 Score 5  Anxiety Difficulty Somewhat difficult    LCSW spoke via phone with client today. LCSW talked with client about medication procurement. Client said he had still not been able to afford to pick up his prescribed medications at local pharmacy.  LCSW informed [name] that RNCM [name] Was in process of communicating with representative at local Health Department regarding possible medication assistance for client.  Client was appreciative of call from LCSW on 08/06/2019  Follow Up Plan: LCSW to call client in next 3 weeks to assess psychosocial needs of client  [name].[name] MSW, LCSW Licensed Clinical Social Worker [facility] [phone]  I have reviewed and agree with the above  documentation.   Dale Dun, FNP

## 2019-08-06 NOTE — Telephone Encounter (Signed)
What is the name of the medication? humalog and DULoxetine (CYMBALTA) 60 MG capsule  Have you contacted your pharmacy to request a refill? Pharmacy called in  Which pharmacy would you like this sent to? [facility]   Patient notified that their request is being sent to the clinical staff for review and that they should receive a call once it is complete. If they do not receive a call within 24 hours they can check with their pharmacy or our office.

## 2019-08-07 ENCOUNTER — Other Ambulatory Visit: Payer: Self-pay | Admitting: Family

## 2019-08-07 DIAGNOSIS — G8929 Other chronic pain: Secondary | ICD-10-CM

## 2019-08-07 DIAGNOSIS — M545 Low back pain, unspecified: Secondary | ICD-10-CM

## 2019-08-09 ENCOUNTER — Other Ambulatory Visit: Payer: Self-pay | Admitting: Family

## 2019-08-09 DIAGNOSIS — N401 Enlarged prostate with lower urinary tract symptoms: Secondary | ICD-10-CM

## 2019-08-09 DIAGNOSIS — R351 Nocturia: Secondary | ICD-10-CM

## 2019-08-09 NOTE — Telephone Encounter (Signed)
07/02/19 RFs set on print, resent

## 2019-08-11 ENCOUNTER — Ambulatory Visit: Payer: Self-pay | Admitting: *Deleted

## 2019-08-11 DIAGNOSIS — I152 Hypertension secondary to endocrine disorders: Secondary | ICD-10-CM

## 2019-08-11 DIAGNOSIS — E1159 Type 2 diabetes mellitus with other circulatory complications: Secondary | ICD-10-CM

## 2019-08-11 DIAGNOSIS — E119 Type 2 diabetes mellitus without complications: Secondary | ICD-10-CM

## 2019-08-11 DIAGNOSIS — Z794 Long term (current) use of insulin: Secondary | ICD-10-CM

## 2019-08-11 NOTE — Patient Instructions (Signed)
Visit Information  Goals Addressed            This Visit's Progress     Patient Stated   . "I need help getting my medication" (pt-stated)       Current Barriers:  [name] Knowledge Deficits related to prescription assistance programs . Lacks caregiver support.  . Film/video editor.   Nurse Case Manager Clinical Goal(s):  [name] Over the next 30 days, patient will receive prescription assistance through the [place] Program  Interventions:  . Collaborated with Hilda Blades with the [facility] [phone] and confirmed that she did receive all of the necessary paperwork for [name]. She has processed his application and waiting on a response.   Patient Self Care Activities:  . Performs ADL's independently . Performs IADL's independently  Please see past updates related to this goal by clicking on the "Past Updates" button in the selected goal           The care management team will reach out to the patient again over the next 30 days.    [name] BSN, RN-BC Embedded Chronic Care Manager [facility] / [place] [facility] Direct Dial: [phone]

## 2019-08-11 NOTE — Chronic Care Management (AMB) (Addendum)
  Chronic Care Management   Care Coordination Note  08/11/2019 Name: [name] MRN: [id number] DOB: 28-Nov-1933  Goals Addressed            This Visit's Progress     Patient Stated   . "I need help getting my medication" (pt-stated)       Current Barriers:  [name] Knowledge Deficits related to prescription assistance programs . Lacks caregiver support.  . Film/video editor.   Nurse Case Manager Clinical Goal(s):  [name] Over the next 30 days, patient will receive prescription assistance through the [place] Program  Interventions:  . Collaborated with Hilda Blades with the [facility] [phone] and confirmed that she did receive all of the necessary paperwork for [name]. She has processed his application and waiting on a response.   Patient Self Care Activities:  . Performs ADL's independently . Performs IADL's independently  Please see past updates related to this goal by clicking on the "Past Updates" button in the selected goal         Follow up plan: The care management team will reach out to the patient again over the next 30 days.   [name] BSN, RN-BC Embedded Chronic Care Manager [facility] / [place] Management Direct Dial: [phone]   I have reviewed and agree with the above  documentation.   [name], FNP'

## 2019-08-12 DIAGNOSIS — M79676 Pain in unspecified toe(s): Secondary | ICD-10-CM | POA: Diagnosis not present

## 2019-08-12 DIAGNOSIS — E1142 Type 2 diabetes mellitus with diabetic polyneuropathy: Secondary | ICD-10-CM | POA: Diagnosis not present

## 2019-08-12 DIAGNOSIS — L84 Corns and callosities: Secondary | ICD-10-CM | POA: Diagnosis not present

## 2019-08-12 DIAGNOSIS — B351 Tinea unguium: Secondary | ICD-10-CM | POA: Diagnosis not present

## 2019-08-17 ENCOUNTER — Ambulatory Visit (INDEPENDENT_AMBULATORY_CARE_PROVIDER_SITE_OTHER): Payer: Medicare Other | Admitting: Licensed Clinical Social Worker

## 2019-08-17 DIAGNOSIS — E1159 Type 2 diabetes mellitus with other circulatory complications: Secondary | ICD-10-CM

## 2019-08-17 DIAGNOSIS — E1169 Type 2 diabetes mellitus with other specified complication: Secondary | ICD-10-CM

## 2019-08-17 DIAGNOSIS — K219 Gastro-esophageal reflux disease without esophagitis: Secondary | ICD-10-CM

## 2019-08-17 DIAGNOSIS — E785 Hyperlipidemia, unspecified: Secondary | ICD-10-CM

## 2019-08-17 DIAGNOSIS — F419 Anxiety disorder, unspecified: Secondary | ICD-10-CM

## 2019-08-17 DIAGNOSIS — Z794 Long term (current) use of insulin: Secondary | ICD-10-CM

## 2019-08-17 DIAGNOSIS — E119 Type 2 diabetes mellitus without complications: Secondary | ICD-10-CM

## 2019-08-17 DIAGNOSIS — I10 Essential (primary) hypertension: Secondary | ICD-10-CM | POA: Diagnosis not present

## 2019-08-17 NOTE — Patient Instructions (Addendum)
Licensed Clinical Social Worker Visit Information  Materials Provided: No   RNCM [name] has been working with Health Department representative to seek possible medication assistance for client.  Paperwork for medication assistance for client has been received at [facility] Department and is being processed. LCSW and client spoke of client needs.  Client spoke of his financial needs. He said he owed some money for care to several hospitals. Client spoke of past accidents and past medical care received. LCSW updated client on status of medication assistance application. Client understood information. LCSW encouraged client to call RNCM as needed to discuss nursing needs of client.   Follow Up Plan: LCSW  to call client in next 3 weeks to talk with client about social work  needs of client  The patient verbalized understanding of instructions provided today and declined a print copy of patient instruction materials.   [name].Aubrianne Molyneux MSW, LCSW Licensed Clinical Social Worker [facility] [phone]

## 2019-08-17 NOTE — Chronic Care Management (AMB) (Addendum)
  Care Management Note   [name] is a 85 y.o. year old male who is a primary care patient of [name], FNP. The CM team was consulted for assistance with chronic disease management and care coordination.   I reached out to [facility] by phone today.   Review of patient status, including review of consultants reports, relevant laboratory and other test results, and collaboration with appropriate care team members and the patient's provider was performed as part of comprehensive patient evaluation and provision of chronic care management services.   Social Determinants of Health: risk of social isolation; risk of tobacco use    Chronic Care Management from 02/18/2019 in [place]  PHQ-9 Total Score  8     GAD 7 : Generalized Anxiety Score 02/18/2019  Nervous, Anxious, on Edge 1  Control/stop worrying 1  Worry too much - different things 1  Trouble relaxing 1  Restless 0  Easily annoyed or irritable 0  Afraid - awful might happen 1  Total GAD 7 Score 5  Anxiety Difficulty Somewhat difficult   RNCM [name] has been working with Health Department representative to seek possible medication assistance for client.  Paperwork for medication assistance for client has been received at [facility] Department and is being processed. LCSW and client spoke of client needs.  Client spoke of his financial needs. He said he owed some money for care to several hospitals. Client spoke of past accidents and past medical care received. LCSW updated client on status of medication assistance application. Client understood information. LCSW encouraged client to call RNCM as needed to discuss nursing needs of client.   Follow Up Plan: LCSW to call client in next 3 weeks to talk with client about social work needs of client  [name].[name] MSW, LCSW Licensed Clinical Social Worker [facility] [phone]  I have  reviewed and agree with the above documentation.   Dale Dun, FNP

## 2019-09-03 ENCOUNTER — Telehealth: Payer: Self-pay | Admitting: Family

## 2019-09-03 ENCOUNTER — Other Ambulatory Visit: Payer: Self-pay | Admitting: *Deleted

## 2019-09-03 DIAGNOSIS — Z794 Long term (current) use of insulin: Secondary | ICD-10-CM

## 2019-09-03 DIAGNOSIS — E119 Type 2 diabetes mellitus without complications: Secondary | ICD-10-CM

## 2019-09-03 MED ORDER — INSULIN LISPRO (1 UNIT DIAL) 100 UNIT/ML (KWIKPEN): 5.0000 [IU] | PEN_INJECTOR | Freq: Three times a day (TID) | SUBCUTANEOUS | 11 refills | Status: DC

## 2019-09-03 MED ORDER — TOUJEO MAX SOLOSTAR 300 UNIT/ML ~~LOC~~ SOPN: 10.0000 [IU] | PEN_INJECTOR | Freq: Every day | SUBCUTANEOUS | 3 refills | Status: DC

## 2019-09-03 NOTE — Telephone Encounter (Signed)
Aware.  Insulin [facility] to [facility] .  All other pharmacies were removed per patient request.

## 2019-09-07 ENCOUNTER — Telehealth: Payer: Self-pay | Admitting: *Deleted

## 2019-09-07 ENCOUNTER — Telehealth: Payer: Self-pay | Admitting: Family

## 2019-09-07 DIAGNOSIS — Z794 Long term (current) use of insulin: Secondary | ICD-10-CM

## 2019-09-07 DIAGNOSIS — E119 Type 2 diabetes mellitus without complications: Secondary | ICD-10-CM

## 2019-09-07 NOTE — Telephone Encounter (Signed)
VM from [facility] rep for Mr Gray Would like generic insulin that is less expensive than the Humalog Kwikpen Please advise

## 2019-09-07 NOTE — Telephone Encounter (Signed)
Can you send in replacement for metformin

## 2019-09-07 NOTE — Telephone Encounter (Signed)
I thought patient had gone through the Health Department and was getting medications at a discounted price?

## 2019-09-07 NOTE — Telephone Encounter (Signed)
Patient states insulin that was sent in cost too much. Please advise. What can he do?

## 2019-09-08 MED ORDER — METFORMIN HCL ER 500 MG PO TB24: 500.0000 mg | ORAL_TABLET | Freq: Three times a day (TID) | ORAL | 0 refills | Status: DC

## 2019-09-08 NOTE — Telephone Encounter (Signed)
Patient aware.

## 2019-09-08 NOTE — Telephone Encounter (Signed)
I sent in a new prescription, they should be replaced with a another form of metformin XR that has been available.

## 2019-09-09 ENCOUNTER — Ambulatory Visit: Payer: Self-pay | Admitting: *Deleted

## 2019-09-09 ENCOUNTER — Ambulatory Visit: Payer: Self-pay | Admitting: Licensed Clinical Social Worker

## 2019-09-09 DIAGNOSIS — E1169 Type 2 diabetes mellitus with other specified complication: Secondary | ICD-10-CM

## 2019-09-09 DIAGNOSIS — E1159 Type 2 diabetes mellitus with other circulatory complications: Secondary | ICD-10-CM

## 2019-09-09 DIAGNOSIS — F419 Anxiety disorder, unspecified: Secondary | ICD-10-CM

## 2019-09-09 DIAGNOSIS — K219 Gastro-esophageal reflux disease without esophagitis: Secondary | ICD-10-CM

## 2019-09-09 DIAGNOSIS — E119 Type 2 diabetes mellitus without complications: Secondary | ICD-10-CM

## 2019-09-09 DIAGNOSIS — Z794 Long term (current) use of insulin: Secondary | ICD-10-CM

## 2019-09-09 MED ORDER — INSULIN ASPART 100 UNIT/ML ~~LOC~~ SOLN: 5.0000 [IU] | Freq: Three times a day (TID) | SUBCUTANEOUS | 6 refills | Status: DC

## 2019-09-09 NOTE — Telephone Encounter (Signed)
Novolog sent in. Pt should not take this and the Humalog. This replaces the Humalog three times a day. I thought he was getting prescription assistance? Did this not go through. He really needs to complete the paper work  So he can get assistance with his medications.

## 2019-09-09 NOTE — Addendum Note (Signed)
Addended by: [name] A on: 09/09/2019 10:53 AM   Modules accepted: Orders

## 2019-09-09 NOTE — Patient Instructions (Signed)
Licensed Clinical Social Worker Visit Information  Goals we discussed today:       . Client stated: "I want to work on my anxiety symptoms and managing anxiety" (pt-stated)     Current Barriers:  [name] Mental Health issues . Financial challenges . Social isolation  Clinical Social Work Clinical Goal(s):  [name] Over the next 30 days, client will work with LCSW to address concerns related to anxiety and anxiety management for client  Interventions:   Talked with client about medical needs of family member (client's sister)  Talked with client about current client needs  Talked with client about medication procurement for client  Self Care Activities: Attends scheduled provider appointments Self administers prescribed medications Perform ADLs  Plan: Client to attend scheduled medical appointments Client to communicate with RN CM to discuss nursing needs of client LCSW to call client in 3 weeks to discuss anxiety symptoms of client and client management of anxiety symptoms  Please see past updates related to this goal by clicking on the "Past Updates" button in the selected goal      Materials Provided: No  Follow Up Plan: LCSW to call client in 3 weeks to discuss anxiety symptoms of client and client management of anxiety symtpoms    The patient verbalized understanding of instructions provided today and declined a print copy of patient instruction materials.   [name].Atiya Yera MSW, LCSW Licensed Clinical Social Worker [facility] [phone]

## 2019-09-09 NOTE — Chronic Care Management (AMB) (Addendum)
Chronic Care Management   Follow Up Note   09/09/2019 Name: [name] MRN: [id number] DOB: Sep 23, 1933  Referred by: [name], FNP Reason for referral : Chronic Care Management (Care Coordination)   [name] is a 85 y.o. year old male who is a primary care patient of [name], FNP. The CCM team was consulted for assistance with chronic disease management and care coordination needs.    Review of patient status, including review of consultants reports, relevant laboratory and other test results, and collaboration with appropriate care team members and the patient's provider was performed as part of comprehensive patient evaluation and provision of chronic care management services.    I attempted to reach [name] by telephone today but was unable to speak with him and could not leave a voicemail message. I was able to speak with multiple  Members of his care team to discuss care coordination. At this time [name] needs to come in to the office for labwork that was ordered in July. After those results are back we will proceed with adjusting his medications with a primary goal of simplifying his regimen and making it more affordable.   Goals Addressed            This Visit's Progress     Patient Stated   . "I need help getting my medication" (pt-stated)       Current Barriers:  [name] Knowledge Deficits related to prescription assistance programs . Lacks caregiver support.  . Film/video editor.   Nurse Case Manager Clinical Goal(s):  [name] Over the next 15 days, the patient will talk with the [facility] regarding medication management and cost  Interventions:  . Consulted by Theadore Nan, LCSW regarding [name] diabetic medications o Pt advised [name] that he was unable to pickup Humalog because of cost but that he has been taking Toujeo . Collaborated with Hilda Blades with the [facility] [phone]. [name] did not  qualify for assistance at the time of application because he was not in the donut hole at that time. Being in the donut hole is required before the companies will provide prescription assistance.  [facility] [phone] o Patient was unable to afford 90 day supply of Humalog for $141 o Historically patient has not been able to afford the 30 day supply either o PCP prescribed Novolog in an attempt to lower cost but Humalog is preferred by his insurance o I asked the pharmacist to review Rx data and verify the last time he picked up a mealtime insulin. She said that it has been over 1 year since he purchased either humalog or novolog.  . Chart reviewed o Patient is overdue for labs from July. I will ask him to come in for these ASAP. - Once back, I will review them with his PCP and we will work on simplifying his diabetes management plan. Can consult with [place] team for recommendations if needed. PCP agreeable to this.  o According to chart, metformin was recalled and patient requested a different medication. Dr Dettinger sent in Metformin XR. I will need to verify pickup.  Patient Self Care Activities:  . Performs ADL's independently . Performs IADL's independently  Please see past updates related to this goal by clicking on the "Past Updates" button in the selected goal      . "I want to get control of my blood sugar" (pt-stated)       Current Barriers:  .  Film/video editor.  . Knowledge Deficits related to diabetes self management   . Anxiety  Nurse Case Manager Clinical Goal(s):  [name] Over the next 7 days, patient will come in to the office for labwork that was ordered in July . Over the next 14 days, patient will state understanding of new plan for diabetes management  Interventions:  . Chart reviewed . Collaboration with PCP regarding medications and patient's ability to manage his medications properly . Patient advised to come in for labwork    Patient  Self Care Activities:  . Performs ADL's independently . Performs IADL's independently  Please see past updates related to this goal by clicking on the "Past Updates" button in the selected goal          CCM team will reach back out to [name] over the next 3 business days.    [name] BSN, RN-BC Embedded Chronic Care Manager [facility] [place] [facility] Direct Dial: [phone]   I have reviewed and agree with the above documentation.   [name], FNP

## 2019-09-09 NOTE — Patient Instructions (Signed)
Visit Information  Goals Addressed            This Visit's Progress     Patient Stated   . "I need help getting my medication" (pt-stated)       Current Barriers:  [name] Knowledge Deficits related to prescription assistance programs . Lacks caregiver support.  . Film/video editor.   Nurse Case Manager Clinical Goal(s):  [name] Over the next 15 days, the patient will talk with the [facility] regarding medication management and cost  Interventions:  . Consulted by Theadore Nan, LCSW regarding Mr Deane diabetic medications o Pt advised [name] that he was unable to pickup Humalog because of cost but that he has been taking Toujeo . Collaborated with Hilda Blades with the [facility] [phone]. Mr Boulden did not qualify for assistance at the time of application because he was not in the donut hole at that time. Being in the donut hole is required before the companies will provide prescription assistance.  [facility] [phone] o Patient was unable to afford 90 day supply of Humalog for $141 o Historically patient has not been able to afford the 30 day supply either o PCP prescribed Novolog in an attempt to lower cost but Humalog is preferred by his insurance o I asked the pharmacist to review Rx data and verify the last time he picked up a mealtime insulin. She said that it has been over 1 year since he purchased either humalog or novolog.  . Chart reviewed o Patient is overdue for labs from July. I will ask him to come in for these ASAP. - Once back, I will review them with his PCP and we will work on simplifying his diabetes management plan. Can consult with [place] team for recommendations if needed. PCP agreeable to this.  o According to chart, metformin was recalled and patient requested a different medication. Dr Dettinger sent in Metformin XR. I will need to verify pickup.  Patient Self Care Activities:  . Performs ADL's  independently . Performs IADL's independently  Please see past updates related to this goal by clicking on the "Past Updates" button in the selected goal      . "I want to get control of my blood sugar" (pt-stated)       Current Barriers:  . Film/video editor.  . Knowledge Deficits related to diabetes self management   . Anxiety  Nurse Case Manager Clinical Goal(s):  [name] Over the next 7 days, patient will come in to the office for labwork that was ordered in July . Over the next 14 days, patient will state understanding of new plan for diabetes management  Interventions:  . Chart reviewed . Collaboration with PCP regarding medications and patient's ability to manage his medications properly . Patient advised to come in for labwork    Patient Self Care Activities:  . Performs ADL's independently . Performs IADL's independently  Please see past updates related to this goal by clicking on the "Past Updates" button in the selected goal          The care management team will reach out to the patient again over the next 3 days.   [name] BSN, RN-BC Embedded Chronic Care Manager [facility] / [place] [facility] Direct Dial: [phone]

## 2019-09-09 NOTE — Chronic Care Management (AMB) (Addendum)
Care Management Note   [name] is a 85 y.o. year old male who is a primary care patient of [name], FNP. The CM team was consulted for assistance with chronic disease management and care coordination.   I reached out to [facility] by phone today.   Review of patient status, including review of consultants reports, relevant laboratory and other test results, and collaboration with appropriate care team members and the patient's provider was performed as part of comprehensive patient evaluation and provision of chronic care management services.   Social Determinants of Health: risk of tobacco use; risk of social isolation    Chronic Care Management from 02/18/2019 in [place]  PHQ-9 Total Score  8     GAD 7 : Generalized Anxiety Score 02/18/2019  Nervous, Anxious, on Edge 1  Control/stop worrying 1  Worry too much - different things 1  Trouble relaxing 1  Restless 0  Easily annoyed or irritable 0  Afraid - awful might happen 1  Total GAD 7 Score 5  Anxiety Difficulty Somewhat difficult   Medications   New medications from outside sources are available for reconciliation   amLODipine (NORVASC) 10 MG tablet    aspirin 81 MG tablet    atorvastatin (LIPITOR) 20 MG tablet    baclofen (LIORESAL) 10 MG tablet    diazepam (VALIUM) 5 MG tablet    DULoxetine (CYMBALTA) 60 MG capsule    finasteride (PROSCAR) 5 MG tablet    furosemide (LASIX) 20 MG tablet    glucose blood (ONE TOUCH ULTRA TEST) test strip    insulin aspart (NOVOLOG) 100 UNIT/ML injection    Insulin Glargine, 2 Unit Dial, (TOUJEO MAX SOLOSTAR) 300 UNIT/ML SOPN    Insulin Pen Needle (ULTICARE MICRO PEN NEEDLES) 32G X 4 MM MISC    meloxicam (MOBIC) 7.5 MG tablet    metFORMIN (GLUCOPHAGE-XR) 500 MG 24 hr tablet    olmesartan (BENICAR) 20 MG tablet    OneTouch Delica Lancets 99991111 MISC    tamsulosin (FLOMAX) 0.4 MG CAPS capsule    Vitamin D, Ergocalciferol, (DRISDOL) 1.25 MG  (50000 UT) CAPS capsule     Goals    . Client stated: "I want to work on my anxiety symptoms and managing anxiety" (pt-stated)     Current Barriers:  [name] Mental Health issues . Financial challenges . Social isolation  Clinical Social Work Clinical Goal(s):  [name] Over the next 30 days, client will work with LCSW to address concerns related to anxiety and anxiety management for client  Interventions:   Talked with client about medical needs of family member (his sister)  Talked with client about current client needs  Talked with client about medication procurement for client  Self Care Activities: Attends scheduled provider appointments Self administers prescribed medications Perform ADLs  Plan: Client to attend scheduled medical appointments Client to communicate with RN CM to discuss nursing needs of client LCSW to call client in 3 weeks to discuss anxiety symptoms of client and client management of anxiety symptoms  Please see past updates related to this goal by clicking on the "Past Updates" button in the selected goal      RNCM [name] has been working with Health Department representative to seek possible medication assistance for client.  Paperwork for medication assistance for client has been received at [facility] and is being processed. LCSW and client spoke of client needs.  Client previously spoke of his financial needs. He said he  owed some money for care to several hospitals.  LCSW previously updated client on status of medication assistance application. Client understood information. LCSW encouraged client to call RNCM as needed to discuss nursing needs of client. Client spoke of medical needs of his sister.  Client spoke of medication needs. He said he was having headaches. He said [facility] but that it cost $ 140.00 to pick up prescription  He said he could not afford to pay $ 140.00 for prescription. LCSW  informed client that LCSW would inform RNCM regarding above client medication information. Client agreed to this plan   Follow Up Plan: LCSW to call client in 3 weeks to discuss anxiety symptoms of client and client management of anxiety symptoms  [name] MSW, LCSW Licensed Clinical Social Worker [facility] Care Management [phone]  I have reviewed and agree with the above  documentation.   Dale Dun, FNP

## 2019-09-10 ENCOUNTER — Ambulatory Visit: Payer: Medicare Other | Admitting: *Deleted

## 2019-09-10 DIAGNOSIS — E1159 Type 2 diabetes mellitus with other circulatory complications: Secondary | ICD-10-CM

## 2019-09-10 DIAGNOSIS — Z794 Long term (current) use of insulin: Secondary | ICD-10-CM

## 2019-09-10 DIAGNOSIS — E119 Type 2 diabetes mellitus without complications: Secondary | ICD-10-CM

## 2019-09-10 NOTE — Patient Instructions (Signed)
Visit Information  Goals Addressed            This Visit's Progress     Patient Stated   . "I need help getting my medication" (pt-stated)       Current Barriers:  [name] Knowledge Deficits related to prescription assistance programs . Lacks caregiver support.  . Film/video editor.   Nurse Case Manager Clinical Goal(s):  [name] Over the next 15 days, the patient will talk with the [facility] regarding medication management and cost  Interventions:  . Talked with patient by telephone.  . Arranged for patient to come in on Monday to have labwork  . RN CCM will collaborate with PCP after lab results are back . RN CCM will discuss DM management plan with patient   Patient Self Care Activities:  . Performs ADL's independently . Performs IADL's independently  Please see past updates related to this goal by clicking on the "Past Updates" button in the selected goal         The patient verbalized understanding of instructions provided today and declined a print copy of patient instruction materials.   The care management team will reach out to the patient again over the next 7 days.   [name] BSN, RN-BC Embedded Chronic Care Manager [facility] / [place] Management Direct Dial: [phone]

## 2019-09-10 NOTE — Chronic Care Management (AMB) (Addendum)
  Chronic Care Management   Follow Up Note   09/10/2019 Name: [name] MRN: [id number] DOB: 1933-09-09  Referred by: [name], FNP Reason for referral : Chronic Care Management (RN CCM Follow Up)   Dashan Kinlock is a 85 y.o. year old male who is a primary care patient of [name], FNP. The CCM team was consulted for assistance with chronic disease management and care coordination needs.    Review of patient status, including review of consultants reports, relevant laboratory and other test results, and collaboration with appropriate care team members and the patient's provider was performed as part of comprehensive patient evaluation and provision of chronic care management services.    I spoke with Mr Helms today by telephone regarding his diabetes management.   Goals Addressed            This Visit's Progress     Patient Stated   . "I need help getting my medication" (pt-stated)       Current Barriers:  [name] Knowledge Deficits related to prescription assistance programs . Lacks caregiver support.  . Film/video editor.   Nurse Case Manager Clinical Goal(s):  [name] Over the next 15 days, the patient will talk with the [facility] regarding medication management and cost  Interventions:  . Talked with patient by telephone.  . Arranged for patient to come in on Monday to have labwork  . RN CCM will collaborate with PCP after lab results are back . RN CCM will discuss DM management plan with patient   Patient Self Care Activities:  . Performs ADL's independently . Performs IADL's independently  Please see past updates related to this goal by clicking on the "Past Updates" button in the selected goal         Plan The care management team will reach out to the patient again over the next 7 days.    [name] BSN, RN-BC Embedded Chronic Care Manager [facility] / [place] Management Direct Dial: [phone]    I have reviewed  and agree with the above AWV documentation.   [name], FNP

## 2019-09-10 NOTE — Chronic Care Management (AMB) (Addendum)
  Chronic Care Management   Follow Up Note  09/10/2019 Name: [name] MRN: [id number] DOB: 11/10/1933  I reached out to [name] twice today by telephone to discuss his diabetes management and medications. I was unable to reach him and his voicemail has not been setup.   Follow up plan: RN CCM will reach out again over the next 7 days.  RN CCM will mail patient a letter, requesting that he come into the office for his overdue labwork and that we are working to simplify his medications and reduce the cost.   [name] BSN, RN-BC [place] / [place] Management Direct Dial: [phone]    I have reviewed and agree with the above documentation.   [name], FNP

## 2019-09-13 ENCOUNTER — Other Ambulatory Visit: Payer: Medicare Other

## 2019-09-13 ENCOUNTER — Telehealth: Payer: Medicare Other

## 2019-09-13 ENCOUNTER — Other Ambulatory Visit: Payer: Self-pay

## 2019-09-13 DIAGNOSIS — I10 Essential (primary) hypertension: Secondary | ICD-10-CM | POA: Diagnosis not present

## 2019-09-13 DIAGNOSIS — G40909 Epilepsy, unspecified, not intractable, without status epilepticus: Secondary | ICD-10-CM

## 2019-09-13 DIAGNOSIS — Z794 Long term (current) use of insulin: Secondary | ICD-10-CM | POA: Diagnosis not present

## 2019-09-13 DIAGNOSIS — N401 Enlarged prostate with lower urinary tract symptoms: Secondary | ICD-10-CM

## 2019-09-13 DIAGNOSIS — E119 Type 2 diabetes mellitus without complications: Secondary | ICD-10-CM | POA: Diagnosis not present

## 2019-09-13 DIAGNOSIS — Z79899 Other long term (current) drug therapy: Secondary | ICD-10-CM | POA: Diagnosis not present

## 2019-09-13 DIAGNOSIS — E1169 Type 2 diabetes mellitus with other specified complication: Secondary | ICD-10-CM | POA: Diagnosis not present

## 2019-09-13 DIAGNOSIS — K219 Gastro-esophageal reflux disease without esophagitis: Secondary | ICD-10-CM

## 2019-09-13 DIAGNOSIS — R351 Nocturia: Secondary | ICD-10-CM

## 2019-09-13 DIAGNOSIS — E559 Vitamin D deficiency, unspecified: Secondary | ICD-10-CM | POA: Diagnosis not present

## 2019-09-13 DIAGNOSIS — E785 Hyperlipidemia, unspecified: Secondary | ICD-10-CM

## 2019-09-13 DIAGNOSIS — E1159 Type 2 diabetes mellitus with other circulatory complications: Secondary | ICD-10-CM | POA: Diagnosis not present

## 2019-09-13 DIAGNOSIS — G8929 Other chronic pain: Secondary | ICD-10-CM

## 2019-09-13 DIAGNOSIS — M545 Low back pain: Secondary | ICD-10-CM | POA: Diagnosis not present

## 2019-09-13 DIAGNOSIS — R609 Edema, unspecified: Secondary | ICD-10-CM

## 2019-09-13 DIAGNOSIS — F419 Anxiety disorder, unspecified: Secondary | ICD-10-CM

## 2019-09-13 DIAGNOSIS — F1320 Sedative, hypnotic or anxiolytic dependence, uncomplicated: Secondary | ICD-10-CM

## 2019-09-13 LAB — BAYER DCA HB A1C WAIVED: HB A1C (BAYER DCA - WAIVED): 7.7 % — ABNORMAL HIGH (ref <7.0)

## 2019-09-14 LAB — MICROALBUMIN / CREATININE URINE RATIO
Creatinine, Urine: 95.2 mg/dL
Microalb/Creat Ratio: 384 mg/g creat — ABNORMAL HIGH (ref 0–29)
Microalbumin, Urine: 365.5 ug/mL

## 2019-09-14 LAB — CBC WITH DIFFERENTIAL/PLATELET
Basophils Absolute: 0 10*3/uL (ref 0.0–0.2)
Basos: 1 %
EOS (ABSOLUTE): 0.1 10*3/uL (ref 0.0–0.4)
Eos: 2 %
Hematocrit: 42.6 % (ref 37.5–51.0)
Hemoglobin: 14.5 g/dL (ref 13.0–17.7)
Immature Grans (Abs): 0 10*3/uL (ref 0.0–0.1)
Immature Granulocytes: 0 %
Lymphocytes Absolute: 2.3 10*3/uL (ref 0.7–3.1)
Lymphs: 34 %
MCH: 29.8 pg (ref 26.6–33.0)
MCHC: 34 g/dL (ref 31.5–35.7)
MCV: 88 fL (ref 79–97)
Monocytes Absolute: 0.6 10*3/uL (ref 0.1–0.9)
Monocytes: 8 %
Neutrophils Absolute: 3.7 10*3/uL (ref 1.4–7.0)
Neutrophils: 55 %
Platelets: 270 10*3/uL (ref 150–450)
RBC: 4.86 x10E6/uL (ref 4.14–5.80)
RDW: 13.7 % (ref 11.6–15.4)
WBC: 6.7 10*3/uL (ref 3.4–10.8)

## 2019-09-14 LAB — LIPID PANEL
Chol/HDL Ratio: 3.2 ratio (ref 0.0–5.0)
Cholesterol, Total: 137 mg/dL (ref 100–199)
HDL: 43 mg/dL (ref >39)
LDL Chol Calc (NIH): 70 mg/dL (ref 0–99)
Triglycerides: 138 mg/dL (ref 0–149)
VLDL Cholesterol Cal: 24 mg/dL (ref 5–40)

## 2019-09-14 LAB — CMP14+EGFR
ALT: 20 IU/L (ref 0–44)
AST: 23 IU/L (ref 0–40)
Albumin/Globulin Ratio: 1.9 (ref 1.2–2.2)
Albumin: 4.8 g/dL — ABNORMAL HIGH (ref 3.6–4.6)
Alkaline Phosphatase: 66 IU/L (ref 39–117)
BUN/Creatinine Ratio: 13 (ref 10–24)
BUN: 11 mg/dL (ref 8–27)
Bilirubin Total: 0.3 mg/dL (ref 0.0–1.2)
CO2: 20 mmol/L (ref 20–29)
Calcium: 9.7 mg/dL (ref 8.6–10.2)
Chloride: 101 mmol/L (ref 96–106)
Creatinine, Ser: 0.87 mg/dL (ref 0.76–1.27)
GFR calc Af Amer: 92 mL/min/{1.73_m2} (ref >59)
GFR calc non Af Amer: 79 mL/min/{1.73_m2} (ref >59)
Globulin, Total: 2.5 g/dL (ref 1.5–4.5)
Glucose: 150 mg/dL — ABNORMAL HIGH (ref 65–99)
Potassium: 4 mmol/L (ref 3.5–5.2)
Sodium: 139 mmol/L (ref 134–144)
Total Protein: 7.3 g/dL (ref 6.0–8.5)

## 2019-09-14 LAB — VITAMIN D 25 HYDROXY (VIT D DEFICIENCY, FRACTURES): Vit D, 25-Hydroxy: 29 ng/mL — ABNORMAL LOW (ref 30.0–100.0)

## 2019-09-15 ENCOUNTER — Ambulatory Visit (INDEPENDENT_AMBULATORY_CARE_PROVIDER_SITE_OTHER): Payer: Medicare Other | Admitting: *Deleted

## 2019-09-15 DIAGNOSIS — I10 Essential (primary) hypertension: Secondary | ICD-10-CM | POA: Diagnosis not present

## 2019-09-15 DIAGNOSIS — E1159 Type 2 diabetes mellitus with other circulatory complications: Secondary | ICD-10-CM | POA: Diagnosis not present

## 2019-09-15 DIAGNOSIS — Z794 Long term (current) use of insulin: Secondary | ICD-10-CM

## 2019-09-15 DIAGNOSIS — E119 Type 2 diabetes mellitus without complications: Secondary | ICD-10-CM | POA: Diagnosis not present

## 2019-09-15 LAB — TOXASSURE SELECT 13 (MW), URINE

## 2019-09-15 NOTE — Chronic Care Management (AMB) (Addendum)
  Chronic Care Management   Care Coordination Note   09/15/2019 Name: [name] MRN: [id number] DOB: 06/04/1933  Referred by: [name], FNP Reason for referral : Chronic Care Management (Care Coordination)   [name] is a 85 y.o. year old male who is a primary care patient of [name], FNP. The CCM team was consulted for assistance with chronic disease management and care coordination needs.    Review of patient status, including review of consultants reports, relevant laboratory and other test results, and collaboration with appropriate care team members and the patient's provider was performed as part of comprehensive patient evaluation and provision of chronic care management services.    SDOH (Social Determinants of Health) screening performed today: Food Insecurity  Social Connections Stress Physical Activity. See Care Plan for related entries.   Advanced Directives Status: Patient does not have Advanced Directives. Advanced Directives were not addressed today.    Goals Addressed            This Visit's Progress     Patient Stated   . "I need help getting my medication" (pt-stated)       Current Barriers:  [name] Knowledge Deficits related to prescription assistance programs . Lacks caregiver support.  . Film/video editor.   Nurse Case Manager Clinical Goal(s):  [name] Over the next 15 days, the patient will talk with the [facility] regarding medication management and cost  Interventions:  . Chart reviewed including recent labs results o Medication changes likely to happen in respect to diabetes management . Will discuss further with PCP and coordinate patient care . Will reach out to patient over the next 3 days once PCP has responded to in-basket message  Patient Self Care Activities:  . Performs ADL's independently . Performs IADL's independently  Please see past updates related to this goal by clicking on the "Past Updates" button in the selected  goal      . "I want to get control of my blood sugar" (pt-stated)       Current Barriers:  . Film/video editor.  . Knowledge Deficits related to diabetes self management   . Anxiety  Nurse Case Manager Clinical Goal(s):  [name] Over the next 14 days, patient will work with Consulting civil engineer to discuss diabetes self management and medication changes  Interventions:  . Chart reviewed including recent lab results . In basket message sent to PCP, [name], FNP regarding improvement in A1C. RN CCM awaiting response.  . Discussed case with diabetes educator  . Considered consulting PharmD regarding necessary medication changes. Will discuss this option with PCP    Patient Self Care Activities:  . Performs ADL's independently . Performs IADL's independently  Please see past updates related to this goal by clicking on the "Past Updates" button in the selected goal          The care management team will reach out to the patient again over the next 3 days.    [name], BSN, RN-BC Embedded Chronic Care Manager [facility] / [place] Management Direct Dial: [phone]   I have reviewed and agree with the above  documentation.   [name], FNP

## 2019-09-17 ENCOUNTER — Ambulatory Visit: Payer: Medicare Other | Admitting: *Deleted

## 2019-09-17 DIAGNOSIS — E119 Type 2 diabetes mellitus without complications: Secondary | ICD-10-CM

## 2019-09-17 DIAGNOSIS — E1159 Type 2 diabetes mellitus with other circulatory complications: Secondary | ICD-10-CM

## 2019-09-17 NOTE — Patient Instructions (Signed)
Visit Information  Goals Addressed            This Visit's Progress     Patient Stated   . COMPLETED: "I need help getting my medication" (pt-stated)       Current Barriers:  [name] Knowledge Deficits related to prescription assistance programs . Lacks caregiver support.  . Film/video editor.   Nurse Case Manager Clinical Goal(s):  [name] Over the next 15 days, the patient will talk with the [facility] regarding medication management and cost  Interventions:  . Chart reviewed including recent labs results o Medication changes likely to happen in respect to diabetes management . Will discuss further with PCP and coordinate patient care . Will reach out to patient over the next 3 days once PCP has responded to in-basket message  Patient Self Care Activities:  . Performs ADL's independently . Performs IADL's independently  Please see past updates related to this goal by clicking on the "Past Updates" button in the selected goal   09/17/2019 Medication changes made. See goal regarding blood sugar control. This goal has been completed for now.      [name] "I want to get control of my blood sugar" (pt-stated)       Current Barriers:  . Financial Constraints.  . Knowledge Deficits related to diabetes self management   . Anxiety  Nurse Case Manager Clinical Goal(s):  [name] Over the next 14 days, patient will work with Consulting civil engineer to discuss diabetes self management and medication changes  Interventions:  . Chart Reviewed . Collaboration with PCP o Recommended continuing current medications o Since patient has not been taking Novolog in more than a year, we will discontinue . Medications reviewed. Will continue Metformin and Toujeo . [facility] advised to discontinue any scripts for Novolog and Humalog that are on file . Will consider consulting Pharmacy Team about lower cost option to replace Toujeo    Patient Self Care Activities:  . Performs ADL's  independently . Performs IADL's independently  Please see past updates related to this goal by clicking on the "Past Updates" button in the selected goal        Follow Up Plan The care management team will reach out to the patient again over the next 5 days.    [name], BSN, RN-BC Embedded Chronic Care Manager [facility] / [place] Management Direct Dial: [phone]

## 2019-09-17 NOTE — Chronic Care Management (AMB) (Addendum)
Chronic Care Management   Follow Up Note   09/17/2019 Name: [name] MRN: [id number] DOB: 07-08-1933  Referred by: [name], FNP Reason for referral : Chronic Care Management (RN CCM Follow Up)   Erick Aye is a 85 y.o. year old male who is a primary care patient of [name], FNP. The CCM team was consulted for assistance with chronic disease management and care coordination needs.    Review of patient status, including review of consultants reports, relevant laboratory and other test results, and collaboration with appropriate care team members and the patient's provider was performed as part of comprehensive patient evaluation and provision of chronic care management services.    SDOH (Social Determinants of Health) screening performed today: Financial Strain  Social Connections Physical Activity. See Care Plan for related entries.   Advanced Directives Status: N See Care Plan and Vynca application for related entries.  Outpatient Encounter Medications as of 09/17/2019  Medication Sig Note  . amLODipine (NORVASC) 10 MG tablet TAKE 1 TABLET BY MOUTH EVERY DAY 08/02/2019: 08/02/2019 [facility]  . aspirin 81 MG tablet Take 81 mg by mouth daily. Takes EC aspirin   . atorvastatin (LIPITOR) 20 MG tablet Take 1 tablet (20 mg total) by mouth daily.   . baclofen (LIORESAL) 10 MG tablet TAKE 0.5 TABLETS (5 MG TOTAL) BY MOUTH 3 (THREE) TIMES DAILY.   . diazepam (VALIUM) 5 MG tablet TAKE 1 TABLET 2 TIMES DAILY AS NEEDED   . DULoxetine (CYMBALTA) 60 MG capsule Take 1 capsule (60 mg total) by mouth daily.   . finasteride (PROSCAR) 5 MG tablet TAKE 1 TABLET (5 MG TOTAL) BY MOUTH AT BEDTIME. FOR PROSTATE   . furosemide (LASIX) 20 MG tablet TAKE 1 OR 2 TABLETS (20-40 MG TOTAL) BY MOUTH DAILY. 08/02/2019: 08/02/19 [facility] to [facility]   . glucose blood (ONE TOUCH ULTRA TEST) test strip USE FOR TESTING TWICE A DAY   . Insulin Glargine, 2 Unit Dial,  (TOUJEO MAX SOLOSTAR) 300 UNIT/ML SOPN Inject 10 Units into the skin at bedtime. May need to find cheaper alternative  . Insulin Pen Needle (ULTICARE MICRO PEN NEEDLES) 32G X 4 MM MISC Check blood sugar 4 x daily and as needed for Dx: E11.9   . meloxicam (MOBIC) 7.5 MG tablet TAKE 1 TABLET BY MOUTH EVERY DAY   . metFORMIN (GLUCOPHAGE-XR) 500 MG 24 hr tablet Take 1 tablet (500 mg total) by mouth 3 (three) times daily with meals.   [name] olmesartan (BENICAR) 20 MG tablet Take 1 tablet (20 mg total) by mouth daily.   Glory Rosebush Delica Lancets 99991111 MISC USE 2 TIMES A DAY Dx E11.9   . tamsulosin (FLOMAX) 0.4 MG CAPS capsule TAKE 2 CAPSULES (0.8 MG TOTAL) BY MOUTH AT BEDTIME. FOR PROSTATE.   [name] Vitamin D, Ergocalciferol, (DRISDOL) 1.25 MG (50000 UT) CAPS capsule Takes on Sunday   . [DISCONTINUED] insulin aspart (NOVOLOG) 100 UNIT/ML injection Inject 5 Units into the skin 3 (three) times daily before meals. Has not taken in over a year. A1C has improved despite this.   No facility-administered encounter medications on file as of 09/17/2019.      Goals Addressed            This Visit's Progress     Patient Stated   . COMPLETED: "I need help getting my medication" (pt-stated)       Current Barriers:  [name] Knowledge Deficits related to  prescription assistance programs . Lacks caregiver support.  . Film/video editor.   Nurse Case Manager Clinical Goal(s):  [name] Over the next 15 days, the patient will talk with the [facility] regarding medication management and cost  Interventions:  . Chart reviewed including recent labs results o Medication changes likely to happen in respect to diabetes management . Will discuss further with PCP and coordinate patient care . Will reach out to patient over the next 3 days once PCP has responded to in-basket message  Patient Self Care Activities:  . Performs ADL's independently . Performs IADL's independently  Please see past updates related to this goal by clicking on  the "Past Updates" button in the selected goal   09/17/2019 Medication changes made. See goal regarding blood sugar control. This goal has been completed for now.      [name] "I want to get control of my blood sugar" (pt-stated)       Current Barriers:  . Financial Constraints.  . Knowledge Deficits related to diabetes self management   . Anxiety  Nurse Case Manager Clinical Goal(s):  [name] Over the next 14 days, patient will work with Consulting civil engineer to discuss diabetes self management and medication changes  Interventions:  . Chart Reviewed . Collaboration with PCP o Recommended continuing current medications o Since patient has not been taking Novolog in more than a year, we will discontinue . Medications reviewed. Will continue Metformin and Toujeo . [facility] advised to discontinue any scripts for Novolog and Humalog that are on file . Will consider consulting Pharmacy Team about lower cost option to replace Toujeo    Patient Self Care Activities:  . Performs ADL's independently . Performs IADL's independently  Please see past updates related to this goal by clicking on the "Past Updates" button in the selected goal         Follow Up Plan The care management team will reach out to the patient again over the next 5 days.    [name], BSN, RN-BC Embedded Chronic Care Manager [facility] [place] Management Direct Dial: [phone]    I have reviewed and agree with the above documentation.   [name], FNP

## 2019-09-20 ENCOUNTER — Telehealth: Payer: Self-pay | Admitting: Family

## 2019-09-20 ENCOUNTER — Telehealth: Payer: Medicare Other | Admitting: *Deleted

## 2019-09-20 NOTE — Telephone Encounter (Signed)
Aware of results per documentation on lab results.

## 2019-09-24 ENCOUNTER — Telehealth: Payer: Self-pay | Admitting: Family

## 2019-09-24 DIAGNOSIS — E119 Type 2 diabetes mellitus without complications: Secondary | ICD-10-CM

## 2019-09-24 MED ORDER — TOUJEO MAX SOLOSTAR 300 UNIT/ML ~~LOC~~ SOPN: 10.0000 [IU] | PEN_INJECTOR | Freq: Every day | SUBCUTANEOUS | 1 refills | Status: DC

## 2019-09-24 NOTE — Telephone Encounter (Signed)
What is the name of the medication? insulin  Have you contacted your pharmacy to request a refill? no  Which pharmacy would you like this sent to? [facility]   Patient notified that their request is being sent to the clinical staff for review and that they should receive a call once it is complete. If they do not receive a call within 24 hours they can check with their pharmacy or our office.

## 2019-09-24 NOTE — Telephone Encounter (Signed)
Refill sent to pharmacy.   

## 2019-09-27 ENCOUNTER — Other Ambulatory Visit: Payer: Self-pay | Admitting: Family

## 2019-09-27 ENCOUNTER — Ambulatory Visit: Payer: Medicare Other | Admitting: *Deleted

## 2019-09-27 DIAGNOSIS — E119 Type 2 diabetes mellitus without complications: Secondary | ICD-10-CM

## 2019-09-27 DIAGNOSIS — Z794 Long term (current) use of insulin: Secondary | ICD-10-CM

## 2019-09-27 MED ORDER — TOUJEO MAX SOLOSTAR 300 UNIT/ML ~~LOC~~ SOPN: 10.0000 [IU] | PEN_INJECTOR | Freq: Every day | SUBCUTANEOUS | 1 refills | Status: DC

## 2019-09-27 NOTE — Telephone Encounter (Signed)
Refill sent.

## 2019-09-28 ENCOUNTER — Ambulatory Visit: Payer: Self-pay | Admitting: *Deleted

## 2019-09-28 ENCOUNTER — Telehealth: Payer: Self-pay | Admitting: Family

## 2019-09-28 ENCOUNTER — Telehealth: Payer: Self-pay | Admitting: *Deleted

## 2019-09-28 DIAGNOSIS — Z794 Long term (current) use of insulin: Secondary | ICD-10-CM

## 2019-09-28 DIAGNOSIS — E119 Type 2 diabetes mellitus without complications: Secondary | ICD-10-CM

## 2019-09-28 DIAGNOSIS — F419 Anxiety disorder, unspecified: Secondary | ICD-10-CM

## 2019-09-28 NOTE — Telephone Encounter (Signed)
Patient aware.

## 2019-09-28 NOTE — Telephone Encounter (Signed)
09/28/2019 Per previous conversations and notes, patient has not been using mealtime insulin regularly in over one year. He did have a sample pen that he has used here and there. Most recently he used 4 units yesterday because his blood sugar was "high".   His A1C is at goal and provider has agreed that novolog or humalog does not need to be filled. See CCM documentation for additional information.  [name], BSN, RN-BC Embedded Chronic Care Manager [facility] / [place] Management Direct Dial: [phone]

## 2019-09-28 NOTE — Telephone Encounter (Signed)
Patient is wanting a prescription of Humalog sent to [facility]. States this medication is only $41

## 2019-09-28 NOTE — Telephone Encounter (Signed)
We have stopped his short acting insulin since his A1C at was at goal.

## 2019-09-29 ENCOUNTER — Ambulatory Visit: Payer: Medicare Other | Admitting: *Deleted

## 2019-09-29 DIAGNOSIS — F419 Anxiety disorder, unspecified: Secondary | ICD-10-CM

## 2019-09-29 DIAGNOSIS — E119 Type 2 diabetes mellitus without complications: Secondary | ICD-10-CM

## 2019-09-29 NOTE — Chronic Care Management (AMB) (Addendum)
  Chronic Care Management   Follow Up Note  09/27/2019 Name: [name] MRN: [id number] DOB: 20-Dec-1933  An unsuccessful telephone follow-up attempt was made today. Care coordination and plan for diabetes self management is needed.  Follow up plan: The care management team will reach out to the patient again over the next 5 days.   [name], BSN, RN-BC Embedded Chronic Care Manager [facility] / [place] Management Direct Dial: [phone]    I have reviewed and agree with the above  documentation.   [name], FNP

## 2019-09-29 NOTE — Chronic Care Management (AMB) (Addendum)
  Chronic Care Management   Follow Up Note  09/28/2019 Name: [name] MRN: [id number] DOB: 09-Oct-1933  Mr Salley called requesting a refill of Novolog at [facility]. He was previously purchasing it from [facility] but the cost was going to be $141. He said that [facility] had it for $45. See telephone encounter from today for reference.   I returned Mr Evrard call to discuss diabetes management. I explained that the [facility] was for a 90 day supply and the price at [facility] is for a 30 day supply. Regardless, his PCP had agreed to discontinue mealtime insulin because his most recent A1C result was at goal despite his noncompliance with using Novolog. There is also a concern about his ability to properly draw up and administer insulin and therefore it will be safer to treat with other medications if possible. He has a lot of anxiety over his medications and proper management of his medical conditions. The simplest way to manage his diabetes effectively, will likely be the most beneficial for him.    Goals Addressed            This Visit's Progress     Patient Stated   . "I want to get control of my blood sugar" (pt-stated)       Current Barriers:  . Financial Constraints.  . Knowledge Deficits related to diabetes self management   . Anxiety  Nurse Case Manager Clinical Goal(s):  [name] Over the next 14 days, patient will work with Consulting civil engineer to discuss diabetes self management and medication changes  Interventions:  . Current treatment recommendations reviewed . Current medication list reviewed with patient. Reminded patient that PCP discontinued mealtime insulin because of desired A1C result despite noncompliance . Patient advised to check and record blood sugar twice daily . Blood sugar log form to be mailed to patient's PO Box . Advised patient to call [phone] or [phone] with blood sugar readings outside of provider recommended parameters . Reviewed symptoms of hypo &  hyperglycemia  Listened to patient's concerns about not having a mealtime insulin  States that he likes to have insulin on hand for when blood sugar is elevated from eating a meal high in sugar or carbs  Advised that it would be better to limit his intake of simple carbs and sugars instead of using insulin to correct his blood sugar after eating too much    Patient Self Care Activities:  . Performs ADL's independently . Performs IADL's independently  Please see past updates related to this goal by clicking on the "Past Updates" button in the selected goal         Follow up plan: The care management team will reach out to the patient again over the next 2 days.    [name], BSN, RN-BC Embedded Chronic Care Manager [facility] / [facility] [facility] Direct Dial: [phone]   I have reviewed and agree with the above  documentation.   [name], FNP

## 2019-09-29 NOTE — Patient Instructions (Signed)
Visit Information  Goals Addressed            This Visit's Progress     Patient Stated   . "I want to get control of my blood sugar" (pt-stated)       Current Barriers:  . Financial Constraints.  . Knowledge Deficits related to diabetes self management   . Anxiety  Nurse Case Manager Clinical Goal(s):  [name] Over the next 14 days, patient will work with Consulting civil engineer to discuss diabetes self management and medication changes  Interventions:  . Current treatment recommendations reviewed . Current medication list reviewed with patient. Reminded patient that PCP discontinued mealtime insulin because of desired A1C result despite noncompliance . Patient advised to check and record blood sugar twice daily . Blood sugar log form to be mailed to patient's PO Box . Advised patient to call [phone] or [phone] with blood sugar readings outside of provider recommended parameters . Reviewed symptoms of hypo & hyperglycemia    Patient Self Care Activities:  . Performs ADL's independently . Performs IADL's independently  Please see past updates related to this goal by clicking on the "Past Updates" button in the selected goal         The patient verbalized understanding of instructions provided today and declined a print copy of patient instruction materials.   Follow up plan: The care management team will reach out to the patient again over the next 2 days.    [name], BSN, RN-BC Embedded Chronic Care Manager [facility] / [facility] [facility] Direct Dial: [phone]

## 2019-09-29 NOTE — Patient Instructions (Signed)
Visit Information  Goals Addressed            This Visit's Progress     Patient Stated   . "I want to get control of my blood sugar" (pt-stated)       Current Barriers:  . Financial Constraints.  . Knowledge Deficits related to diabetes self management   . Anxiety  Nurse Case Manager Clinical Goal(s):  [name] Over the next 14 days, patient will work with Consulting civil engineer to discuss diabetes self management and medication changes  Interventions:  . Because I'm working remotely, I asked Mary Sella, LPN to mail a couple of blood sugar log sheets to Mr Scale's PO Box. . I plan to consult with [name], FNP when she is in the office tomorrow regarding his insulin and my recommendation to monitor blood sugar for now and address any necessary changes after we have a week or so of data . Reviewed Freestyle Libre results from 03/10/2019. Blood sugar levels were fairly stable with no dramatic spikes. He was noncompliant with mealtime insulin at that time as well.   Patient Self Care Activities:  . Performs ADL's independently . Performs IADL's independently  Please see past updates related to this goal by clicking on the "Past Updates" button in the selected goal         The patient verbalized understanding of instructions provided today and declined a print copy of patient instruction materials.   Plan RN CCM will talk with PCP regarding plan of care for diabetes LPN will mail blood sugar logs to patient RN CCM will f/u with patient over the next 2 days regarding plan of care for diabetes  [name], BSN, RN-BC [place] [facility] [place] [facility] Direct Dial: [phone]

## 2019-09-29 NOTE — Chronic Care Management (AMB) (Addendum)
  Chronic Care Management   Care Coordination  09/29/2019 Name: [name] MRN: [id number] DOB: 1933-12-07  Referred by: [name], FNP Reason for referral : No chief complaint on file.   [name] is a 85 y.o. year old male who is a primary care patient of [name], FNP. The CCM team was consulted for assistance with chronic disease management and care coordination needs.      [name] "I want to get control of my blood sugar" (pt-stated)       Current Barriers:  . Financial Constraints.  . Knowledge Deficits related to diabetes self management   . Anxiety  Nurse Case Manager Clinical Goal(s):  [name] Over the next 14 days, patient will work with Consulting civil engineer to discuss diabetes self management and medication changes  Interventions:  . Because I'm working remotely, I asked Mary Sella, LPN to mail a couple of blood sugar log sheets to Mr Scale's PO Box. . I plan to consult with [name], FNP when she is in the office tomorrow regarding his insulin and my recommendation to monitor blood sugar for now and address any necessary changes after we have a week or so of data . Reviewed Freestyle Libre results from 03/10/2019. Blood sugar levels were fairly stable with no dramatic spikes. He was noncompliant with mealtime insulin at that time as well.   Patient Self Care Activities:  . Performs ADL's independently . Performs IADL's independently  Please see past updates related to this goal by clicking on the "Past Updates" button in the selected goal         Plan RN CCM will talk with PCP regarding plan of care for diabetes LPN will mail blood sugar logs to patient RN CCM will f/u with patient over the next 2 days regarding plan of care for diabetes  [name], BSN, RN-BC [place] / [place]: [phone]    I have reviewed and agree with the above  documentation.   [name], FNP

## 2019-09-30 ENCOUNTER — Ambulatory Visit: Payer: Medicare Other | Admitting: *Deleted

## 2019-09-30 DIAGNOSIS — E119 Type 2 diabetes mellitus without complications: Secondary | ICD-10-CM

## 2019-09-30 DIAGNOSIS — F419 Anxiety disorder, unspecified: Secondary | ICD-10-CM

## 2019-09-30 DIAGNOSIS — Z794 Long term (current) use of insulin: Secondary | ICD-10-CM

## 2019-09-30 NOTE — Chronic Care Management (AMB) (Addendum)
Chronic Care Management   Follow-up Note  09/30/2019 Name: [name] MRN: [id number] DOB: 01-Oct-1933  I spoke with Mr Fullen by telephone today regarding his blood sugar management. It is difficult to obtain needed information because he provides a lot of information about various subjects during each conversation. It's hard to keep him focused on the problem at hand.   We touched on his use of Novolog during our last conversation but I was able to get a better grasp of what's he's doing during today's call. He has not purchased "daytime" insulin, Novolog, in over a year but he has been given samples occasionally. He only uses about 4 units 3 times a week when he thinks his sugar is "too high". He mentioned 400 but then retracted that and said that it hasn't been that high in a long time. The recent highest was around 190. These elevations cause him anxiety and he is comforted by having insulin on hand to use to bring the levels down. I feel this is more mentally therapeutic than physically therapeutic. He has some in his current pen and would like to have some more on hand to use as needed. I explained that I'm more concerned about his blood sugar dropping too low than I am about brief spikes to less than 200.   Goals Addressed            This Visit's Progress     Patient Stated   . "I want to get control of my blood sugar" (pt-stated)       Current Barriers:  . Financial Constraints.  . Knowledge Deficits related to diabetes self management   . Anxiety  Nurse Case Manager Clinical Goal(s):  [name] Over the next 14 days, patient will work with Consulting civil engineer to discuss diabetes self management and medication changes  Interventions:  . Advised patient that he should receive blood sugar logs next week.  . Asked him to record his blood sugar at least twice a day . Asked him to write down what time he uses insulin and how much he uses and also write down any symptoms he's having . RN will  f/u with patient by telephone next week to review log . Patient provided with CCM contact information and encouraged to reach out with any questions or concerns . Reviewed signs of hypoglycemia. I'm more concerned about this than spikes. Eliott Nine him to limit simple carbs and sugars . He should call his PCP at [phone] or me [phone] with any blood sugar readings greater than 200.   Patient Self Care Activities:  . Performs ADL's independently . Performs IADL's independently  Please see past updates related to this goal by clicking on the "Past Updates" button in the selected goal        Lengthy discussion with patient with 28 minutes of discussion focused on diabetes management and 20 minutes on other topics that are causing him anxiety.   Follow up plan:  Patient will record blood sugar readings, insulin doses, and symptoms for the next week  RN CCM will consult with PCP regarding refill at patient's request  Consider Freestyle Libre sensor again to see if blood sugar patterns have changed since March 2020  Follow up with LCSW regarding psychosocial issues   [name], BSN, RN-BC [place] / [place]: [phone]    I have reviewed and agree with the above  documentation.   [name],  FNP

## 2019-09-30 NOTE — Patient Instructions (Signed)
Visit Information  Goals Addressed            This Visit's Progress     Patient Stated   . "I want to get control of my blood sugar" (pt-stated)       Current Barriers:  . Financial Constraints.  . Knowledge Deficits related to diabetes self management   . Anxiety  Nurse Case Manager Clinical Goal(s):  [name] Over the next 14 days, patient will work with Consulting civil engineer to discuss diabetes self management and medication changes  Interventions:  . Advised patient that he should receive blood sugar logs next week.  . Asked him to record his blood sugar at least twice a day . Asked him to write down what time he uses insulin and how much he uses and also write down any symptoms he's having . RN will f/u with patient by telephone next week to review log . Patient provided with CCM contact information and encouraged to reach out with any questions or concerns . Patient provided with CCM contact information and encouraged to reach out with any questions or concerns . Reviewed signs of hypoglycemia. I'm more concerned about this than spikes. Eliott Nine him to limit simple carbs and sugars . He should call his PCP at [phone] or me [phone] with any blood sugar readings greater than 200.   Patient Self Care Activities:  . Performs ADL's independently . Performs IADL's independently  Please see past updates related to this goal by clicking on the "Past Updates" button in the selected goal         The patient verbalized understanding of instructions provided today and declined a print copy of patient instruction materials.   Follow up plan:  Patient will record blood sugar readings, insulin doses, and symptoms for the next week  RN CCM will consult with PCP regarding refill at patient's request  Consider Freestyle Libre sensor again to see if blood sugar patterns have changed since March 2020  Follow up with LCSW regarding psychosocial issues   [name], BSN,  RN-BC [place] [facility] [place] [facility] Direct Dial: [phone]

## 2019-10-01 ENCOUNTER — Ambulatory Visit (INDEPENDENT_AMBULATORY_CARE_PROVIDER_SITE_OTHER): Payer: Medicare Other | Admitting: Licensed Clinical Social Worker

## 2019-10-01 DIAGNOSIS — Z794 Long term (current) use of insulin: Secondary | ICD-10-CM | POA: Diagnosis not present

## 2019-10-01 DIAGNOSIS — G8929 Other chronic pain: Secondary | ICD-10-CM

## 2019-10-01 DIAGNOSIS — E1159 Type 2 diabetes mellitus with other circulatory complications: Secondary | ICD-10-CM | POA: Diagnosis not present

## 2019-10-01 DIAGNOSIS — M545 Low back pain, unspecified: Secondary | ICD-10-CM

## 2019-10-01 DIAGNOSIS — K219 Gastro-esophageal reflux disease without esophagitis: Secondary | ICD-10-CM

## 2019-10-01 DIAGNOSIS — I10 Essential (primary) hypertension: Secondary | ICD-10-CM

## 2019-10-01 DIAGNOSIS — E119 Type 2 diabetes mellitus without complications: Secondary | ICD-10-CM

## 2019-10-01 DIAGNOSIS — F419 Anxiety disorder, unspecified: Secondary | ICD-10-CM

## 2019-10-01 NOTE — Patient Instructions (Addendum)
Licensed Clinical Social Worker Visit Information  Goals we discussed today:  Goals        . Client stated: "I want to work on my anxiety symptoms and managing anxiety" (pt-stated)     Current Barriers:  [name] Mental Health issues . Financial challenges . Social isolation  Clinical Social Work Clinical Goal(s):  [name] Over the next 30 days, client will work with LCSW to address concerns related to anxiety and anxiety management for client  Interventions:   Talked with client about current client needs  Talked with client about medication procurement for client  Talked with client about ambulation needs of client  Talked with client about pain issues of client  Encouraged client to talk with RNCM to discuss nursing needs of client  Talked with client about faith issues for client  Self Care Activities: Attends scheduled provider appointments Self administers prescribed medications Perform ADLs  Plan: Client to attend scheduled medical appointments Client to communicate with RN CM to discuss nursing needs of client LCSW to call client in 3 weeks to discuss anxiety symptoms of client and client management of anxiety symptoms  Please see past updates related to this goal by clicking on the "Past Updates" button in the selected goal        Materials Provided: No  Follow Up Plan: LCSW to call client in next 3 weeks to discuss anxiety symptoms of client and client management of anxiety symptoms  The patient verbalized understanding of instructions provided today and declined a print copy of patient instruction materials.   [name].Shalamar Crays MSW, LCSW Licensed Clinical Social Worker [facility] [phone]

## 2019-10-01 NOTE — Chronic Care Management (AMB) (Addendum)
Care Management Note   [name] is a 85 y.o. year old male who is a primary care patient of [name], FNP. The CM team was consulted for assistance with chronic disease management and care coordination.   I reached out to [facility] by phone today.   Review of patient status, including review of consultants reports, relevant laboratory and other test results, and collaboration with appropriate care team members and the patient's provider was performed as part of comprehensive patient evaluation and provision of chronic care management services.   Social Determinants of Health: Risk of social isolation; risk of stress; risk of food insecurity; risk of financial strain; risk of tobacco use    Chronic Care Management from 02/18/2019 in [place]  PHQ-9 Total Score  8     GAD 7 : Generalized Anxiety Score 02/18/2019  Nervous, Anxious, on Edge 1  Control/stop worrying 1  Worry too much - different things 1  Trouble relaxing 1  Restless 0  Easily annoyed or irritable 0  Afraid - awful might happen 1  Total GAD 7 Score 5  Anxiety Difficulty Somewhat difficult   Medications    amLODipine (NORVASC) 10 MG tablet    aspirin 81 MG tablet    atorvastatin (LIPITOR) 20 MG tablet    baclofen (LIORESAL) 10 MG tablet    diazepam (VALIUM) 5 MG tablet    DULoxetine (CYMBALTA) 60 MG capsule    finasteride (PROSCAR) 5 MG tablet    furosemide (LASIX) 20 MG tablet    glucose blood (ONE TOUCH ULTRA TEST) test strip    Insulin Glargine, 2 Unit Dial, (TOUJEO MAX SOLOSTAR) 300 UNIT/ML SOPN    Insulin Pen Needle (ULTICARE MICRO PEN NEEDLES) 32G X 4 MM MISC    meloxicam (MOBIC) 7.5 MG tablet    metFORMIN (GLUCOPHAGE-XR) 500 MG 24 hr tablet    olmesartan (BENICAR) 20 MG tablet    OneTouch Delica Lancets 99991111 MISC    tamsulosin (FLOMAX) 0.4 MG CAPS capsule    Vitamin D, Ergocalciferol, (DRISDOL) 1.25 MG (50000 UT) CAPS capsule      Goals        . Client  stated: "I want to work on my anxiety symptoms and managing anxiety" (pt-stated)     Current Barriers:  [name] Mental Health issues . Financial challenges . Social isolation  Clinical Social Work Clinical Goal(s):  [name] Over the next 30 days, client will work with LCSW to address concerns related to anxiety and anxiety management for client  Interventions:   Talked with client about current client needs  Talked with client about medication procurement for client  Talked with client about ambulation needs of client  Talked with client about pain issues of client  Encouraged client to talk with RNCM to discuss nursing needs of client  Talked with client about faith issues for client  Self Care Activities: Attends scheduled provider appointments Self administers prescribed medications Perform ADLs  Plan: Client to attend scheduled medical appointments Client to communicate with RN CM to discuss nursing needs of client LCSW to call client in 3 weeks to discuss anxiety symptoms of client and client management of anxiety symptoms  Please see past updates related to this goal by clicking on the "Past Updates" button in the selected goal     Follow Up Plan: LCSW to call client in next 3 weeks to discuss anxiety symptoms of client and client management of anxiety symptoms  [name].Sammye Staff MSW, [employer] Licensed  Clinical Social Worker [facility] [phone]   I have reviewed and agree with the above  documentation.   [name], FNP

## 2019-10-05 ENCOUNTER — Ambulatory Visit: Payer: Medicare Other | Admitting: *Deleted

## 2019-10-05 ENCOUNTER — Ambulatory Visit: Payer: Self-pay | Admitting: *Deleted

## 2019-10-05 DIAGNOSIS — E1159 Type 2 diabetes mellitus with other circulatory complications: Secondary | ICD-10-CM

## 2019-10-05 DIAGNOSIS — E119 Type 2 diabetes mellitus without complications: Secondary | ICD-10-CM

## 2019-10-05 DIAGNOSIS — F419 Anxiety disorder, unspecified: Secondary | ICD-10-CM

## 2019-10-06 ENCOUNTER — Telehealth: Payer: Medicare Other | Admitting: *Deleted

## 2019-10-06 ENCOUNTER — Other Ambulatory Visit: Payer: Self-pay | Admitting: Family

## 2019-10-06 DIAGNOSIS — M545 Low back pain, unspecified: Secondary | ICD-10-CM

## 2019-10-06 DIAGNOSIS — G8929 Other chronic pain: Secondary | ICD-10-CM

## 2019-10-06 NOTE — Patient Instructions (Signed)
. "  I want to get control of my blood sugar" (pt-stated)       Current Barriers:  . Film/video editor.  . Knowledge Deficits related to diabetes self management   . Anxiety  Nurse Case Manager Clinical Goal(s):  [name] Over the next 14 days, patient will work with Consulting civil engineer to discuss diabetes self management and medication changes  Interventions:  . Collaborated with PCP regarding blood sugar management and medications . Verbal order for Novolog 4 units SQ PRN for blood sugar readings over 250 . If having to use daily, patient should call PCP at [phone] or RN at [phone] . Will discuss with patient and let provider know which pharmacy to send medication to  Patient Self Care Activities:  . Performs ADL's independently . Performs IADL's independently  Please see past updates related to this goal by clicking on the "Past Updates" button in the selected goal         Follow Up Plan RN CCM will talk with patient over the next 2 days   [name], BSN, RN-BC [place] / [place]: [phone]

## 2019-10-06 NOTE — Chronic Care Management (AMB) (Addendum)
  Chronic Care Management   Care Coordination Note   10/05/2019 Name: [name] MRN: [id number] DOB: 1933-12-10  Referred by: [name], FNP Reason for referral : Chronic Care Management (Care Coordination)   [name] is a 85 y.o. year old male who is a primary care patient of [name], FNP. The CCM team was consulted for assistance with chronic disease management and care coordination needs.    I spoke with [name], FNP regarding [name] diabetes management and medications. He has had problems with mediation cost in the past. Specifically with insulin, and we were attempting to simplify his management routine and lower his out-of-pocket costs. I relayed that even though it is costly, he feels more comfortable when he has Novolog on hand to use when his "blood sugar is high".     . "I want to get control of my blood sugar" (pt-stated)       Current Barriers:  . Financial Constraints.  . Knowledge Deficits related to diabetes self management   . Anxiety  Nurse Case Manager Clinical Goal(s):  [name] Over the next 14 days, patient will work with Consulting civil engineer to discuss diabetes self management and medication changes  Interventions:  . Collaborated with PCP regarding blood sugar management and medications . Verbal order for Novolog 4 units SQ PRN for blood sugar readings over 250 . If having to use daily, patient should call PCP at [phone] or RN at [phone] . Will discuss with patient and let provider know which pharmacy to send medication to  Patient Self Care Activities:  . Performs ADL's independently . Performs IADL's independently  Please see past updates related to this goal by clicking on the "Past Updates" button in the selected goal         Follow Up Plan RN CCM will talk with patient over the next 2 days   [name], BSN, RN-BC [place] / [place]: [phone]   I have reviewed and agree with the above  documentation.   [name], FNP

## 2019-10-07 NOTE — Chronic Care Management (AMB) (Signed)
Duplicate Encounter

## 2019-10-08 ENCOUNTER — Other Ambulatory Visit: Payer: Self-pay | Admitting: Family

## 2019-10-11 ENCOUNTER — Telehealth: Payer: Self-pay | Admitting: Family

## 2019-10-11 ENCOUNTER — Telehealth: Payer: Self-pay | Admitting: *Deleted

## 2019-10-11 MED ORDER — GLUCOSE BLOOD VI STRP: ORAL_STRIP | 2 refills | Status: DC

## 2019-10-11 NOTE — Telephone Encounter (Signed)
10/11/2019  Humalog script setup per previous conversation regarding diabetes management. Will send to PCP for review and signature if appropriate. Patient is requesting it be sent to [facility]. I was unable to locate the previously setup and pended script.    [name], BSN, RN-BC Embedded Chronic Care Manager [facility] / [place] [facility] Direct Dial: [phone]

## 2019-10-11 NOTE — Telephone Encounter (Signed)
What is the name of the medication? Test strips--checks 7x a day  Have you contacted your pharmacy to request a refill?yes  [facility]   Patient notified that their request is being sent to the clinical staff for review and that they should receive a call once it is complete. If they do not receive a call within 24 hours they can check with their pharmacy or our office.

## 2019-10-11 NOTE — Telephone Encounter (Signed)
done

## 2019-10-12 MED ORDER — INSULIN LISPRO (1 UNIT DIAL) 100 UNIT/ML (KWIKPEN): PEN_INJECTOR | SUBCUTANEOUS | 2 refills | Status: DC

## 2019-10-12 NOTE — Telephone Encounter (Signed)
Prescription sent to pharmacy.

## 2019-10-19 ENCOUNTER — Other Ambulatory Visit: Payer: Self-pay

## 2019-10-19 ENCOUNTER — Ambulatory Visit (INDEPENDENT_AMBULATORY_CARE_PROVIDER_SITE_OTHER): Payer: Medicare Other | Admitting: Family

## 2019-10-19 ENCOUNTER — Encounter: Payer: Self-pay | Admitting: Family

## 2019-10-19 VITALS — BP 166/91 | HR 85 | Temp 98.6°F | Ht 70.0 in | Wt 220.0 lb

## 2019-10-19 DIAGNOSIS — Z23 Encounter for immunization: Secondary | ICD-10-CM | POA: Diagnosis not present

## 2019-10-19 DIAGNOSIS — Z794 Long term (current) use of insulin: Secondary | ICD-10-CM

## 2019-10-19 DIAGNOSIS — G40909 Epilepsy, unspecified, not intractable, without status epilepticus: Secondary | ICD-10-CM

## 2019-10-19 DIAGNOSIS — Z789 Other specified health status: Secondary | ICD-10-CM

## 2019-10-19 DIAGNOSIS — E785 Hyperlipidemia, unspecified: Secondary | ICD-10-CM

## 2019-10-19 DIAGNOSIS — E559 Vitamin D deficiency, unspecified: Secondary | ICD-10-CM

## 2019-10-19 DIAGNOSIS — E119 Type 2 diabetes mellitus without complications: Secondary | ICD-10-CM | POA: Diagnosis not present

## 2019-10-19 DIAGNOSIS — E1169 Type 2 diabetes mellitus with other specified complication: Secondary | ICD-10-CM | POA: Diagnosis not present

## 2019-10-19 DIAGNOSIS — F419 Anxiety disorder, unspecified: Secondary | ICD-10-CM

## 2019-10-19 DIAGNOSIS — R351 Nocturia: Secondary | ICD-10-CM

## 2019-10-19 DIAGNOSIS — I10 Essential (primary) hypertension: Secondary | ICD-10-CM | POA: Diagnosis not present

## 2019-10-19 DIAGNOSIS — I152 Hypertension secondary to endocrine disorders: Secondary | ICD-10-CM

## 2019-10-19 DIAGNOSIS — F1320 Sedative, hypnotic or anxiolytic dependence, uncomplicated: Secondary | ICD-10-CM

## 2019-10-19 DIAGNOSIS — K219 Gastro-esophageal reflux disease without esophagitis: Secondary | ICD-10-CM | POA: Diagnosis not present

## 2019-10-19 DIAGNOSIS — N401 Enlarged prostate with lower urinary tract symptoms: Secondary | ICD-10-CM

## 2019-10-19 DIAGNOSIS — Z79899 Other long term (current) drug therapy: Secondary | ICD-10-CM

## 2019-10-19 DIAGNOSIS — E1159 Type 2 diabetes mellitus with other circulatory complications: Secondary | ICD-10-CM | POA: Diagnosis not present

## 2019-10-19 LAB — BAYER DCA HB A1C WAIVED: HB A1C (BAYER DCA - WAIVED): 8.2 % — ABNORMAL HIGH (ref <7.0)

## 2019-10-19 MED ORDER — DIAZEPAM 5 MG PO TABS: ORAL_TABLET | ORAL | 2 refills | Status: DC
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
## 2019-10-19 NOTE — Patient Instructions (Signed)

## 2019-10-20 ENCOUNTER — Other Ambulatory Visit: Payer: Self-pay | Admitting: Family

## 2019-10-20 ENCOUNTER — Telehealth: Payer: Self-pay | Admitting: Family

## 2019-10-20 LAB — CMP14+EGFR
ALT: 25 IU/L (ref 0–44)
AST: 28 IU/L (ref 0–40)
Albumin/Globulin Ratio: 1.8 (ref 1.2–2.2)
Albumin: 4.8 g/dL — ABNORMAL HIGH (ref 3.6–4.6)
Alkaline Phosphatase: 70 IU/L (ref 39–117)
BUN/Creatinine Ratio: 13 (ref 10–24)
BUN: 12 mg/dL (ref 8–27)
Bilirubin Total: 0.3 mg/dL (ref 0.0–1.2)
CO2: 25 mmol/L (ref 20–29)
Calcium: 10 mg/dL (ref 8.6–10.2)
Chloride: 102 mmol/L (ref 96–106)
Creatinine, Ser: 0.92 mg/dL (ref 0.76–1.27)
GFR calc Af Amer: 88 mL/min/{1.73_m2} (ref >59)
GFR calc non Af Amer: 76 mL/min/{1.73_m2} (ref >59)
Globulin, Total: 2.7 g/dL (ref 1.5–4.5)
Glucose: 129 mg/dL — ABNORMAL HIGH (ref 65–99)
Potassium: 3.9 mmol/L (ref 3.5–5.2)
Sodium: 141 mmol/L (ref 134–144)
Total Protein: 7.5 g/dL (ref 6.0–8.5)

## 2019-10-20 NOTE — Telephone Encounter (Signed)
Aware.  Script will not be filled.

## 2019-10-20 NOTE — Telephone Encounter (Signed)
Sorry, but given age and risks I can prescribe him this.

## 2019-10-20 NOTE — Telephone Encounter (Signed)
Please advise if patient may have this medication ordered?

## 2019-10-25 ENCOUNTER — Ambulatory Visit: Payer: Self-pay | Admitting: Licensed Clinical Social Worker

## 2019-10-25 DIAGNOSIS — E1159 Type 2 diabetes mellitus with other circulatory complications: Secondary | ICD-10-CM

## 2019-10-25 DIAGNOSIS — E1169 Type 2 diabetes mellitus with other specified complication: Secondary | ICD-10-CM

## 2019-10-25 DIAGNOSIS — M545 Low back pain, unspecified: Secondary | ICD-10-CM

## 2019-10-25 DIAGNOSIS — Z794 Long term (current) use of insulin: Secondary | ICD-10-CM

## 2019-10-25 DIAGNOSIS — F419 Anxiety disorder, unspecified: Secondary | ICD-10-CM

## 2019-10-25 DIAGNOSIS — K219 Gastro-esophageal reflux disease without esophagitis: Secondary | ICD-10-CM

## 2019-10-25 DIAGNOSIS — G8929 Other chronic pain: Secondary | ICD-10-CM

## 2019-10-25 DIAGNOSIS — E119 Type 2 diabetes mellitus without complications: Secondary | ICD-10-CM

## 2019-10-25 NOTE — Chronic Care Management (AMB) (Addendum)
Care Management Note   [name] is a 85 y.o. year old male who is a primary care patient of [name], FNP. The CM team was consulted for assistance with chronic disease management and care coordination.   I reached out to [facility] by phone today.   Review of patient status, including review of consultants reports, relevant laboratory and other test results, and collaboration with appropriate care team members and the patient's provider was performed as part of comprehensive patient evaluation and provision of chronic care management services.   Social determinants of health: risk of social isolation; risk of stress; risk of food insecurity; risk of financial strain    Chronic Care Management from 02/18/2019 in [place]  PHQ-9 Total Score  8     GAD 7 : Generalized Anxiety Score 02/18/2019  Nervous, Anxious, on Edge 1  Control/stop worrying 1  Worry too much - different things 1  Trouble relaxing 1  Restless 0  Easily annoyed or irritable 0  Afraid - awful might happen 1  Total GAD 7 Score 5  Anxiety Difficulty Somewhat difficult   Medications    amLODipine (NORVASC) 10 MG tablet    aspirin 81 MG tablet    atorvastatin (LIPITOR) 20 MG tablet    baclofen (LIORESAL) 10 MG tablet    diazepam (VALIUM) 5 MG tablet    DULoxetine (CYMBALTA) 60 MG capsule    finasteride (PROSCAR) 5 MG tablet    furosemide (LASIX) 20 MG tablet    glucose blood (ONE TOUCH ULTRA TEST) test strip    Insulin Glargine, 2 Unit Dial, (TOUJEO MAX SOLOSTAR) 300 UNIT/ML SOPN    insulin lispro (HUMALOG) 100 UNIT/ML KwikPen    Insulin Pen Needle (ULTICARE MICRO PEN NEEDLES) 32G X 4 MM MISC    losartan (COZAAR) 100 MG tablet    meloxicam (MOBIC) 7.5 MG tablet    metFORMIN (GLUCOPHAGE-XR) 500 MG 24 hr tablet    olmesartan (BENICAR) 20 MG tablet    OneTouch Delica Lancets 99991111 MISC    ONETOUCH ULTRA test strip    tamsulosin (FLOMAX) 0.4 MG CAPS capsule    Vitamin D,  Ergocalciferol, (DRISDOL) 1.25 MG (50000 UT) CAPS capsule      Goals        . Client stated: "I want to work on my anxiety symptoms and managing anxiety" (pt-stated)     Current Barriers:  [name] Mental Health issues . Financial challenges . Social isolation  Clinical Social Work Clinical Goal(s):  [name] Over the next 30 days, client will work with LCSW to address concerns related to anxiety and anxiety management for client  Interventions:   Previously talked with client about current client needs  Talked with client previously  about medication procurement for client  Talked with client previously about ambulation needs of client  Previously LCSW talked with client about pain issues of client  Previously LCSW encouraged client to talk with RNCM to discuss nursing needs of client  Self Care Activities: Attends scheduled provider appointments Self administers prescribed medications Perform ADLs  Plan: Client to attend scheduled medical appointments Client to communicate with RN CM to discuss nursing needs of client LCSW to call client in 3 weeks to discuss anxiety symptoms of client and client management of anxiety symptoms  Please see past updates related to this goal by clicking on the "Past Updates" button in the selected goal       Follow Up Plan: LCSW to call client in  next 3 weeks to discuss anxiety symptoms of client and to discuss anxiety symptoms management for client  [name].Jaliel Deavers MSW, LCSW Licensed Clinical Social Worker [facility] Care Management [phone]  I have reviewed and agree with the above documentation.   [name], FNP

## 2019-10-25 NOTE — Patient Instructions (Addendum)
Licensed Clinical Social Worker Visit Information  Goals we discussed today:    Goals        . Client stated: "I want to work on my anxiety symptoms and managing anxiety" (pt-stated)     Current Barriers:  [name] Mental Health issues . Financial challenges . Social isolation  Clinical Social Work Clinical Goal(s):  [name] Over the next 30 days, client will work with LCSW to address concerns related to anxiety and anxiety management for client  Interventions:   Previously talked with client about current client needs  Talked with client previously about medication procurement for client   Talked with client previously about ambulation needs of client  Previously LCSW talked with client about pain issues of client  Previously LCSW encouraged client to talk with RNCM to discuss nursing needs of client  Self Care Activities: Attends scheduled provider appointments Self administers prescribed medications Perform ADLs  Plan: Client to attend scheduled medical appointments Client to communicate with RN CM to discuss nursing needs of client LCSW to call client in 3 weeks to discuss anxiety symptoms of client and client management of anxiety symptoms  Please see past updates related to this goal by clicking on the "Past Updates" button in the selected goal       Materials Provided: No  Follow Up Plan: LCSW to call client in next 3 weeks to discuss anxiety symptoms of client and client management of anxiety symptoms  The patient verbalized understanding of instructions provided today and declined a print copy of patient instruction materials.    [name].[name] MSW, LCSW Licensed Clinical Social Worker [facility] Care Management [phone]

## 2019-10-28 ENCOUNTER — Telehealth: Payer: Self-pay | Admitting: Family

## 2019-10-28 NOTE — Telephone Encounter (Signed)
Aware.  Can take medicine to pharmacy to evaluate and recognize type of drug.

## 2019-11-01 ENCOUNTER — Ambulatory Visit: Payer: Medicare Other

## 2019-11-09 ENCOUNTER — Other Ambulatory Visit: Payer: Self-pay

## 2019-11-09 ENCOUNTER — Encounter: Payer: Self-pay | Admitting: Family

## 2019-11-09 ENCOUNTER — Ambulatory Visit (INDEPENDENT_AMBULATORY_CARE_PROVIDER_SITE_OTHER): Payer: Medicare Other | Admitting: Family

## 2019-11-09 VITALS — BP 150/81 | HR 94 | Temp 97.5°F | Ht 70.0 in | Wt 224.0 lb

## 2019-11-09 DIAGNOSIS — N3281 Overactive bladder: Secondary | ICD-10-CM | POA: Diagnosis not present

## 2019-11-09 DIAGNOSIS — N401 Enlarged prostate with lower urinary tract symptoms: Secondary | ICD-10-CM

## 2019-11-09 DIAGNOSIS — R350 Frequency of micturition: Secondary | ICD-10-CM | POA: Diagnosis not present

## 2019-11-09 LAB — MICROSCOPIC EXAMINATION
Bacteria, UA: NONE SEEN
Epithelial Cells (non renal): NONE SEEN /hpf (ref 0–10)
Renal Epithel, UA: NONE SEEN /hpf
WBC, UA: NONE SEEN /hpf (ref 0–5)

## 2019-11-09 LAB — URINALYSIS, COMPLETE
Bilirubin, UA: NEGATIVE
Ketones, UA: NEGATIVE
Leukocytes,UA: NEGATIVE
Nitrite, UA: NEGATIVE
Specific Gravity, UA: 1.025 (ref 1.005–1.030)
Urobilinogen, Ur: 0.2 mg/dL (ref 0.2–1.0)
pH, UA: 7 (ref 5.0–7.5)

## 2019-11-09 MED ORDER — OXYBUTYNIN CHLORIDE ER 10 MG PO TB24: 10.0000 mg | ORAL_TABLET | Freq: Every day | ORAL | 1 refills | Status: DC

## 2019-11-09 NOTE — Progress Notes (Signed)
Subjective:    Patient ID: [name], male    DOB: 1933/08/24, 85 y.o.   MRN: [id number]  Chief Complaint  Patient presents with  . Urinary Tract Infection    Urinary Frequency  This is a new problem. The current episode started 1 to 4 weeks ago. The problem occurs every urination. The problem has been unchanged. The pain is at a severity of 0/10. The patient is experiencing no pain. There has been no fever. Associated symptoms include frequency and urgency. Pertinent negatives include no discharge, flank pain, hematuria or hesitancy. He has tried nothing for the symptoms. The treatment provided mild relief.      Review of Systems  Genitourinary: Positive for frequency and urgency. Negative for flank pain, hematuria and hesitancy.  All other systems reviewed and are negative.      Objective:   Physical Exam Vitals signs reviewed.  Constitutional:      General: He is not in acute distress.    Appearance: He is well-developed.  HENT:     Head: Normocephalic.  Eyes:     General:        Right eye: No discharge.        Left eye: No discharge.     Pupils: Pupils are equal, round, and reactive to light.  Neck:     Musculoskeletal: Normal range of motion and neck supple.     Thyroid: No thyromegaly.  Cardiovascular:     Rate and Rhythm: Normal rate and regular rhythm.     Heart sounds: Normal heart sounds. No murmur.  Pulmonary:     Effort: Pulmonary effort is normal. No respiratory distress.     Breath sounds: Normal breath sounds. No wheezing.  Abdominal:     General: Bowel sounds are normal. There is no distension.     Palpations: Abdomen is soft.     Tenderness: There is no abdominal tenderness.  Musculoskeletal: Normal range of motion.        General: No tenderness.  Skin:    General: Skin is warm and dry.     Findings: No erythema or rash.  Neurological:     Mental Status: He is alert and oriented to person, place, and time.     Cranial Nerves: No cranial  nerve deficit.     Deep Tendon Reflexes: Reflexes are normal and symmetric.  Psychiatric:        Behavior: Behavior normal.        Thought Content: Thought content normal.        Judgment: Judgment normal.       BP (!) 150/81   Pulse 94   Temp (!) 97.5 F (36.4 C) (Temporal)   Ht 5\' 10"  (1.778 m)   Wt 224 lb (101.6 kg)   SpO2 98%   BMI 32.14 kg/m      Assessment & Plan:  Jaylind Baisch comes in today with chief complaint of Urinary Tract Infection   Diagnosis and orders addressed:  1. Urine frequency  - Urine culture - urinalysis- dip and micro - oxybutynin (DITROPAN XL) 10 MG 24 hr tablet; Take 1 tablet (10 mg total) by mouth at bedtime.  Dispense: 90 tablet; Refill: 1  2. Benign prostatic hyperplasia with urinary frequency - oxybutynin (DITROPAN XL) 10 MG 24 hr tablet; Take 1 tablet (10 mg total) by mouth at bedtime.  Dispense: 90 tablet; Refill: 1  3. Overactive bladder - oxybutynin (DITROPAN XL) 10 MG 24 hr tablet; Take 1 tablet (10 mg  total) by mouth at bedtime.  Dispense: 90 tablet; Refill: 1  Urine negative Continue Flomax and Proscar Will add Oxybutnin  Will check on Urologists referral Keep follow up   [name], FNP

## 2019-11-09 NOTE — Patient Instructions (Signed)
Urinary Frequency, Adult Urinary frequency means urinating more often than usual. You may urinate every 1-2 hours even though you drink a normal amount of fluid and do not have a bladder infection or condition. Although you urinate more often than normal, the total amount of urine produced in a day is normal. With urinary frequency, you may have an urgent need to urinate often. The stress and anxiety of needing to find a bathroom quickly can make this urge worse. This condition may go away on its own or you may need treatment at home. Home treatment may include bladder training, exercises, taking medicines, or making changes to your diet. Follow these instructions at home: Bladder health   Keep a bladder diary if told by your health care provider. Keep track of: ? What you eat and drink. ? How often you urinate. ? How much you urinate.  Follow a bladder training program if told by your health care provider. This may include: ? Learning to delay going to the bathroom. ? Double urinating (voiding). This helps if you are not completely emptying your bladder. ? Scheduled voiding.  Do Kegel exercises as told by your health care provider. Kegel exercises strengthen the muscles that help control urination, which may help the condition. Eating and drinking  If told by your health care provider, make diet changes, such as: ? Avoiding caffeine. ? Drinking fewer fluids, especially alcohol. ? Not drinking in the evening. ? Avoiding foods or drinks that may irritate the bladder. These include coffee, tea, soda, artificial sweeteners, citrus, tomato-based foods, and chocolate. ? Eating foods that help prevent or ease constipation. Constipation can make this condition worse. Your health care provider may recommend that you:  Drink enough fluid to keep your urine pale yellow.  Take over-the-counter or prescription medicines.  Eat foods that are high in fiber, such as beans, whole grains, and fresh  fruits and vegetables.  Limit foods that are high in fat and processed sugars, such as fried or sweet foods. General instructions  Take over-the-counter and prescription medicines only as told by your health care provider.  Keep all follow-up visits as told by your health care provider. This is important. Contact a health care provider if:  You start urinating more often.  You feel pain or irritation when you urinate.  You notice blood in your urine.  Your urine looks cloudy.  You develop a fever.  You begin vomiting. Get help right away if:  You are unable to urinate. Summary  Urinary frequency means urinating more often than usual. With urinary frequency, you may urinate every 1-2 hours even though you drink a normal amount of fluid and do not have a bladder infection or other bladder condition.  Your health care provider may recommend that you keep a bladder diary, follow a bladder training program, or make dietary changes.  If told by your health care provider, do Kegel exercises to strengthen the muscles that help control urination.  Take over-the-counter and prescription medicines only as told by your health care provider.  Contact a health care provider if your symptoms do not improve or get worse. This information is not intended to replace advice given to you by your health care provider. Make sure you discuss any questions you have with your health care provider. Document Released: 10/12/2009 Document Revised: 06/25/2018 Document Reviewed: 06/25/2018 Elsevier Patient Education  2020 [employer] Inc[employer]  

## 2019-11-10 LAB — URINE CULTURE

## 2019-11-15 ENCOUNTER — Ambulatory Visit (INDEPENDENT_AMBULATORY_CARE_PROVIDER_SITE_OTHER): Payer: Medicare Other | Admitting: Licensed Clinical Social Worker

## 2019-11-15 DIAGNOSIS — M545 Low back pain, unspecified: Secondary | ICD-10-CM

## 2019-11-15 DIAGNOSIS — E1159 Type 2 diabetes mellitus with other circulatory complications: Secondary | ICD-10-CM

## 2019-11-15 DIAGNOSIS — E1169 Type 2 diabetes mellitus with other specified complication: Secondary | ICD-10-CM

## 2019-11-15 DIAGNOSIS — I10 Essential (primary) hypertension: Secondary | ICD-10-CM | POA: Diagnosis not present

## 2019-11-15 DIAGNOSIS — E119 Type 2 diabetes mellitus without complications: Secondary | ICD-10-CM

## 2019-11-15 DIAGNOSIS — K219 Gastro-esophageal reflux disease without esophagitis: Secondary | ICD-10-CM

## 2019-11-15 DIAGNOSIS — F419 Anxiety disorder, unspecified: Secondary | ICD-10-CM

## 2019-11-15 DIAGNOSIS — G8929 Other chronic pain: Secondary | ICD-10-CM

## 2019-11-15 DIAGNOSIS — Z794 Long term (current) use of insulin: Secondary | ICD-10-CM | POA: Diagnosis not present

## 2019-11-15 DIAGNOSIS — E785 Hyperlipidemia, unspecified: Secondary | ICD-10-CM

## 2019-11-15 NOTE — Chronic Care Management (AMB) (Addendum)
Care Management Note   [name] is a 85 y.o. year old male who is a primary care patient of [name], FNP. The CM team was consulted for assistance with chronic disease management and care coordination.   I reached out to [facility] by phone today.   Review of patient status, including review of consultants reports, relevant laboratory and other test results, and collaboration with appropriate care team members and the patient's provider was performed as part of comprehensive patient evaluation and provision of chronic care management services.   Social determinants of health: risk of social isolation; risk of stress; risk of physical inactivity; risk of food insecurity; risk of tobacco use; risk of financial strain    Chronic Care Management from 02/18/2019 in [place]  PHQ-9 Total Score  8     GAD 7 : Generalized Anxiety Score 02/18/2019  Nervous, Anxious, on Edge 1  Control/stop worrying 1  Worry too much - different things 1  Trouble relaxing 1  Restless 0  Easily annoyed or irritable 0  Afraid - awful might happen 1  Total GAD 7 Score 5  Anxiety Difficulty Somewhat difficult   Medications   New medications from outside sources are available for reconciliation   amLODipine (NORVASC) 10 MG tablet    aspirin 81 MG tablet    atorvastatin (LIPITOR) 20 MG tablet    baclofen (LIORESAL) 10 MG tablet    diazepam (VALIUM) 5 MG tablet    DULoxetine (CYMBALTA) 60 MG capsule    finasteride (PROSCAR) 5 MG tablet    furosemide (LASIX) 20 MG tablet    glucose blood (ONE TOUCH ULTRA TEST) test strip    Insulin Glargine, 2 Unit Dial, (TOUJEO MAX SOLOSTAR) 300 UNIT/ML SOPN    insulin lispro (HUMALOG) 100 UNIT/ML KwikPen    Insulin Pen Needle (ULTICARE MICRO PEN NEEDLES) 32G X 4 MM MISC    losartan (COZAAR) 100 MG tablet    meloxicam (MOBIC) 7.5 MG tablet    metFORMIN (GLUCOPHAGE-XR) 500 MG 24 hr tablet    olmesartan (BENICAR) 20 MG tablet    OneTouch Delica Lancets 99991111 MISC    ONETOUCH ULTRA test strip    oxybutynin (DITROPAN XL) 10 MG 24 hr tablet    tamsulosin (FLOMAX) 0.4 MG CAPS capsule    Vitamin D, Ergocalciferol, (DRISDOL) 1.25 MG (50000 UT) CAPS capsule      GOAL:  . Client stated: "I want to work on my anxiety symptoms and managing anxiety" (pt-stated)     Current Barriers:  [name] Mental Health issues . Financial challenges . Social isolation  Clinical Social Work Clinical Goal(s):  [name] Over the next 30 days, client will work with LCSW to address concerns related to anxiety and anxiety management for client  Interventions:   Talked previously with client about client needs  Talked previously with client about medication procurement for client  Previously encouraged client to talk with RNCM as needed to discuss nursing needs of client  Client talked of the importance of his faith to him  Client talked of his past medical history  Self Care Activities: Attends scheduled provider appointments Self administers prescribed medications Perform ADLs  Plan: Client to attend scheduled medical appointments Client to communicate with RN CM to discuss nursing needs of client LCSW to call client in 3 weeks to discuss anxiety symptoms of client and client management of anxiety symptoms  Please see past updates related to this goal by clicking on the "Past Updates"  button in the selected goal       Follow Up Plan: LCSW to call client in next 3 weeks to discuss anxiety symptoms of client and to discuss client management of anxiety symptoms  [name].Ryver Poblete MSW, LCSW Licensed Clinical Social Worker [facility] Care Management [phone]  I have reviewed and agree with the above  documentation.   [name], FNP

## 2019-11-15 NOTE — Patient Instructions (Addendum)
Licensed Clinical Education officer, museum Visit Information  Goals we discussed today:     GOAL:          . Client stated: "I want to work on my anxiety symptoms and managing anxiety" (pt-stated)      Current Barriers:   Mental Health issues  Financial challenges  Social isolation  Clinical Social Work Clinical Goal(s):   Over the next 30 days, client will work with LCSW to address concerns related to anxiety and anxiety management for client  Interventions:   Talked previously with client about client needs  Talked previously with client about medication procurement for client  Previously encouraged client to talk with RNCM as needed to discuss nursing needs of client  Client talked of the importance of his faith to him  Client talked of his past medical history  Self Care Activities: Attends scheduled provider appointments Self administers prescribed medications Perform ADLs  Plan: Client to attend scheduled medical appointments Client to communicate with RN CM to discuss nursing needs of client LCSW to call client in 3 weeks to discuss anxiety symptoms of client and client management of anxiety symptoms  Please see past updates related to this goal by clicking on the "Past Updates" button in the selected goal       Follow Up Plan: LCSW to call client in next 3 weeks to discuss anxiety symptoms of client and to discuss client management of anxiety symptoms  Materials Provided: No  The patient verbalized understanding of instructions provided today and declined a print copy of patient instruction materials.   [name].Clifford Benninger MSW, LCSW Licensed Clinical Social Worker [facility] Care Management [phone]

## 2019-11-16 DIAGNOSIS — M79676 Pain in unspecified toe(s): Secondary | ICD-10-CM | POA: Diagnosis not present

## 2019-11-16 DIAGNOSIS — L84 Corns and callosities: Secondary | ICD-10-CM | POA: Diagnosis not present

## 2019-11-16 DIAGNOSIS — E1142 Type 2 diabetes mellitus with diabetic polyneuropathy: Secondary | ICD-10-CM | POA: Diagnosis not present

## 2019-11-16 DIAGNOSIS — B351 Tinea unguium: Secondary | ICD-10-CM | POA: Diagnosis not present

## 2019-11-17 ENCOUNTER — Telehealth: Payer: Medicare Other

## 2019-11-19 ENCOUNTER — Ambulatory Visit: Payer: Medicare Other | Admitting: *Deleted

## 2019-12-03 ENCOUNTER — Other Ambulatory Visit: Payer: Self-pay | Admitting: Family

## 2019-12-05 ENCOUNTER — Other Ambulatory Visit: Payer: Self-pay | Admitting: Family

## 2019-12-05 DIAGNOSIS — R351 Nocturia: Secondary | ICD-10-CM

## 2019-12-07 ENCOUNTER — Other Ambulatory Visit: Payer: Self-pay | Admitting: Family

## 2019-12-07 DIAGNOSIS — E1159 Type 2 diabetes mellitus with other circulatory complications: Secondary | ICD-10-CM

## 2019-12-07 DIAGNOSIS — R609 Edema, unspecified: Secondary | ICD-10-CM

## 2019-12-07 DIAGNOSIS — I10 Essential (primary) hypertension: Secondary | ICD-10-CM

## 2019-12-08 ENCOUNTER — Ambulatory Visit (INDEPENDENT_AMBULATORY_CARE_PROVIDER_SITE_OTHER): Payer: Medicare Other | Admitting: Licensed Clinical Social Worker

## 2019-12-08 DIAGNOSIS — E1159 Type 2 diabetes mellitus with other circulatory complications: Secondary | ICD-10-CM

## 2019-12-08 DIAGNOSIS — E119 Type 2 diabetes mellitus without complications: Secondary | ICD-10-CM

## 2019-12-08 DIAGNOSIS — F419 Anxiety disorder, unspecified: Secondary | ICD-10-CM

## 2019-12-08 DIAGNOSIS — E785 Hyperlipidemia, unspecified: Secondary | ICD-10-CM

## 2019-12-08 DIAGNOSIS — E1169 Type 2 diabetes mellitus with other specified complication: Secondary | ICD-10-CM

## 2019-12-08 DIAGNOSIS — I152 Hypertension secondary to endocrine disorders: Secondary | ICD-10-CM

## 2019-12-08 DIAGNOSIS — Z794 Long term (current) use of insulin: Secondary | ICD-10-CM | POA: Diagnosis not present

## 2019-12-08 DIAGNOSIS — I10 Essential (primary) hypertension: Secondary | ICD-10-CM

## 2019-12-08 DIAGNOSIS — K219 Gastro-esophageal reflux disease without esophagitis: Secondary | ICD-10-CM

## 2019-12-08 NOTE — Chronic Care Management (AMB) (Addendum)
Care Management Note   [name] is a 85 y.o. year old male who is a primary care patient of [name], FNP. The CM team was consulted for assistance with chronic disease management and care coordination.   I reached out to [facility] by phone today.   Review of patient status, including review of consultants reports, relevant laboratory and other test results, and collaboration with appropriate care team members and the patient's provider was performed as part of comprehensive patient evaluation and provision of chronic care management services.   Social determinants of health: risk of social isolation; risk of tobacco use; risk of financial strain; risk of stress; risk of physical inactivity; risk of food insecurity    Chronic Care Management from 02/18/2019 in [place]  PHQ-9 Total Score  8      GAD 7 : Generalized Anxiety Score 02/18/2019  Nervous, Anxious, on Edge 1  Control/stop worrying 1  Worry too much - different things 1  Trouble relaxing 1  Restless 0  Easily annoyed or irritable 0  Afraid - awful might happen 1  Total GAD 7 Score 5  Anxiety Difficulty Somewhat difficult   Medications   New medications from outside sources are available for reconciliation   amLODipine (NORVASC) 10 MG tablet    aspirin 81 MG tablet    atorvastatin (LIPITOR) 20 MG tablet    baclofen (LIORESAL) 10 MG tablet    diazepam (VALIUM) 5 MG tablet    DULoxetine (CYMBALTA) 60 MG capsule    finasteride (PROSCAR) 5 MG tablet    furosemide (LASIX) 20 MG tablet    glucose blood (ONE TOUCH ULTRA TEST) test strip    Insulin Glargine, 2 Unit Dial, (TOUJEO MAX SOLOSTAR) 300 UNIT/ML SOPN    insulin lispro (HUMALOG) 100 UNIT/ML KwikPen    Insulin Pen Needle (ULTICARE MICRO PEN NEEDLES) 32G X 4 MM MISC    losartan (COZAAR) 100 MG tablet    meloxicam (MOBIC) 7.5 MG tablet    metFORMIN (GLUCOPHAGE-XR) 500 MG 24 hr tablet    olmesartan (BENICAR) 20 MG tablet    OneTouch Delica Lancets 99991111 MISC    ONETOUCH ULTRA test strip    oxybutynin (DITROPAN XL) 10 MG 24 hr tablet    tamsulosin (FLOMAX) 0.4 MG CAPS capsule    Vitamin D, Ergocalciferol, (DRISDOL) 1.25 MG (50000 UT) CAPS capsule      Goals Addressed             This Visit's Progress    Client stated: "I want to work on my anxiety symptoms and managing anxiety" (pt-stated)       Current Barriers:  Mental Health issues of client with chronic diagnoses of GERD, Anxiety, Type 2 DM, HTN, and Hyperlipidemia Financial challenges Social isolation  Clinical Social Work Clinical Goal(s):  Over the next 30 days, client will work with LCSW to address concerns related to anxiety and anxiety management for client  Interventions:  Previously encouraged client to communicate with RNCM to discuss nursing needs of client Talked previously with client about client needs Talked with client previously about medication procurement for client Talked previously with client about ambulation needs of client Talked previously with client about pain issues of client  Self Care Activities: Attends scheduled provider appointments Self administers prescribed medications Perform ADLs  Plan: Client to attend scheduled medical appointments Client to communicate with RN CM to discuss nursing needs of client LCSW to call client in 4 weeks to discuss anxiety  symptoms of client and client management of anxiety symptoms  Please see past updates related to this goal by clicking on the "Past Updates" button in the selected goal        Follow Up Plan: LCSW to call client in next 4 weeks to talk with client about anxiety symptoms of client and client  management of anxiety symptoms  [name].Marcus Groll MSW, LCSW Licensed Clinical Social Worker [facility] Care Management [phone]  I have reviewed and agree with the above documentation.   [name], FNP

## 2019-12-08 NOTE — Patient Instructions (Addendum)
Licensed Clinical Social Worker Visit Information  Goals we discussed today:  Goals Addressed            This Visit's Progress   . Client stated: "I want to work on my anxiety symptoms and managing anxiety" (pt-stated)       Current Barriers:  [name] Mental Health issues of client with chronic diagnoses of GERD, Anxiety, Type 2 DM, HTN, and Hyperlipidemia . Financial challenges . Social isolation  Clinical Social Work Clinical Goal(s):  [name] Over the next 30 days, client will work with LCSW to address concerns related to anxiety and anxiety management for client  Interventions:   Previously encouraged client to communicate with RNCM to discuss nursing needs of client  Talked previously with client about client needs  Talked with client previously about medication procurement for client  Talked previously with client about ambulation needs of client  Talked previously with client about pain issues of client  Self Care Activities: Attends scheduled provider appointments Self administers prescribed medications Perform ADLs  Plan: Client to attend scheduled medical appointments Client to communicate with RN CM to discuss nursing needs of client LCSW to call client in 4 weeks to discuss anxiety symptoms of client and client management of anxiety symptoms  Please see past updates related to this goal by clicking on the "Past Updates" button in the selected goal        Materials Provided: No  Follow Up Plan: LCSW to call client in next 4 weeks to discuss anxiety symptoms of  client and client management of anxiety symptoms  The patient verbalized understanding of instructions provided today and declined a print copy of patient instruction materials.   [name].Shawna Wearing MSW, LCSW Licensed Clinical Social Worker [facility] Care Management [phone]

## 2019-12-09 ENCOUNTER — Other Ambulatory Visit: Payer: Self-pay

## 2019-12-10 ENCOUNTER — Ambulatory Visit (INDEPENDENT_AMBULATORY_CARE_PROVIDER_SITE_OTHER): Payer: Medicare Other | Admitting: Family

## 2019-12-10 ENCOUNTER — Encounter: Payer: Self-pay | Admitting: Family

## 2019-12-10 VITALS — BP 184/78 | HR 90 | Temp 97.9°F | Ht 70.0 in | Wt 229.0 lb

## 2019-12-10 DIAGNOSIS — M545 Low back pain, unspecified: Secondary | ICD-10-CM

## 2019-12-10 DIAGNOSIS — G8929 Other chronic pain: Secondary | ICD-10-CM

## 2019-12-10 NOTE — Patient Instructions (Signed)

## 2019-12-10 NOTE — Progress Notes (Signed)
   Subjective:    Patient ID: [name], male    DOB: [birth year], 85 y.o.   MRN: [id number]  Chief Complaint  Patient presents with  . Back Pain    Back Pain This is a chronic problem. The current episode started more than 1 year ago. The problem occurs intermittently. The problem has been gradually worsening since onset. The pain is present in the lumbar spine and thoracic spine. The quality of the pain is described as aching. The pain is at a severity of 10/10. The pain is moderate. The symptoms are aggravated by bending and standing. Pertinent negatives include no bladder incontinence, bowel incontinence or leg pain. He has tried bed rest, walking and muscle relaxant for the symptoms. The treatment provided mild relief.      Review of Systems  Gastrointestinal: Negative for bowel incontinence.  Genitourinary: Negative for bladder incontinence.  Musculoskeletal: Positive for back pain.       Objective:   Physical Exam Vitals reviewed.  Constitutional:      General: He is not in acute distress.    Appearance: He is well-developed.  HENT:     Head: Normocephalic.  Eyes:     General:        Right eye: No discharge.        Left eye: No discharge.     Pupils: Pupils are equal, round, and reactive to light.  Neck:     Thyroid: No thyromegaly.  Cardiovascular:     Rate and Rhythm: Normal rate and regular rhythm.     Heart sounds: Normal heart sounds. No murmur.  Pulmonary:     Effort: Pulmonary effort is normal. No respiratory distress.     Breath sounds: Normal breath sounds. No wheezing.  Abdominal:     General: Bowel sounds are normal. There is no distension.     Palpations: Abdomen is soft.     Tenderness: There is no abdominal tenderness.  Musculoskeletal:        General: No tenderness. Normal range of motion.     Cervical back: Normal range of motion and neck supple.     Comments: Pain in lower lumbar with flexion and extension  Skin:    General: Skin is warm  and dry.     Findings: No erythema or rash.  Neurological:     Mental Status: He is alert and oriented to person, place, and time.     Cranial Nerves: No cranial nerve deficit.     Deep Tendon Reflexes: Reflexes are normal and symmetric.  Psychiatric:        Behavior: Behavior normal.        Thought Content: Thought content normal.        Judgment: Judgment normal.     BP (!) 184/78   Pulse 90   Temp 97.9 F (36.6 C) (Temporal)   Ht 5\' 10"  (1.778 m)   Wt 229 lb (103.9 kg)   SpO2 99%   BMI 32.86 kg/m      Assessment & Plan:  [name] comes in today with chief complaint of Back Pain   Diagnosis and orders addressed:  1. Chronic bilateral low back pain without sciatica Rest ROM exercises discussed Ortho and PT pending Call if symptoms worsen or do not improve  - MR Lumbar Spine Wo Contrast; Future - Ambulatory referral to Physical Therapy - Ambulatory referral to [place], FNP

## 2019-12-15 ENCOUNTER — Telehealth: Payer: Self-pay | Admitting: Family

## 2019-12-15 ENCOUNTER — Other Ambulatory Visit: Payer: Self-pay

## 2019-12-15 ENCOUNTER — Ambulatory Visit: Payer: Medicare Other | Admitting: Physical Therapy

## 2019-12-15 ENCOUNTER — Ambulatory Visit: Payer: Medicare Other | Admitting: Orthopaedic Surgery

## 2019-12-16 ENCOUNTER — Ambulatory Visit: Payer: Medicare Other | Attending: Family | Admitting: Physical Therapy

## 2019-12-16 ENCOUNTER — Ambulatory Visit: Payer: Medicare Other | Admitting: Orthopaedic Surgery

## 2019-12-16 NOTE — Telephone Encounter (Signed)
ok 

## 2019-12-21 ENCOUNTER — Ambulatory Visit: Payer: Medicare Other | Admitting: Physical Therapy

## 2019-12-21 NOTE — Telephone Encounter (Signed)
Appointments have been cancelled.

## 2019-12-27 ENCOUNTER — Ambulatory Visit (HOSPITAL_COMMUNITY): Payer: Medicare Other

## 2019-12-28 ENCOUNTER — Other Ambulatory Visit: Payer: Self-pay | Admitting: Family

## 2019-12-28 ENCOUNTER — Other Ambulatory Visit: Payer: Self-pay | Admitting: *Deleted

## 2019-12-28 MED ORDER — BLOOD GLUCOSE METER KIT: PACK | 0 refills | Status: DC
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
## 2019-12-28 NOTE — Telephone Encounter (Signed)
No voice mail box to leave message.   Script for glucose [facility] today.

## 2020-01-03 DIAGNOSIS — Z0289 Encounter for other administrative examinations: Secondary | ICD-10-CM

## 2020-01-03 MED ORDER — BLOOD GLUCOSE METER KIT: PACK | 0 refills | Status: DC

## 2020-01-03 MED ORDER — BLOOD GLUCOSE MONITOR SYSTEM W/DEVICE KIT: 1.0000 | PACK | Freq: Two times a day (BID) | 0 refills | Status: DC
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
## 2020-01-06 ENCOUNTER — Ambulatory Visit (INDEPENDENT_AMBULATORY_CARE_PROVIDER_SITE_OTHER): Payer: Medicare Other | Admitting: Licensed Clinical Social Worker

## 2020-01-06 ENCOUNTER — Other Ambulatory Visit: Payer: Self-pay | Admitting: Family

## 2020-01-06 DIAGNOSIS — E785 Hyperlipidemia, unspecified: Secondary | ICD-10-CM

## 2020-01-06 DIAGNOSIS — E119 Type 2 diabetes mellitus without complications: Secondary | ICD-10-CM

## 2020-01-06 DIAGNOSIS — I152 Hypertension secondary to endocrine disorders: Secondary | ICD-10-CM

## 2020-01-06 DIAGNOSIS — E1169 Type 2 diabetes mellitus with other specified complication: Secondary | ICD-10-CM | POA: Diagnosis not present

## 2020-01-06 DIAGNOSIS — Z794 Long term (current) use of insulin: Secondary | ICD-10-CM

## 2020-01-06 DIAGNOSIS — F419 Anxiety disorder, unspecified: Secondary | ICD-10-CM

## 2020-01-06 DIAGNOSIS — K219 Gastro-esophageal reflux disease without esophagitis: Secondary | ICD-10-CM

## 2020-01-06 DIAGNOSIS — I10 Essential (primary) hypertension: Secondary | ICD-10-CM | POA: Diagnosis not present

## 2020-01-06 DIAGNOSIS — E1159 Type 2 diabetes mellitus with other circulatory complications: Secondary | ICD-10-CM | POA: Diagnosis not present

## 2020-01-06 NOTE — Chronic Care Management (AMB) (Addendum)
Care Management Note   [name] is a 86 y.o. year old male who is a primary care patient of [name], FNP. The CM team was consulted for assistance with chronic disease management and care coordination.   I reached out to [facility] by phone today  Review of patient status, including review of consultants reports, relevant laboratory and other test results, and collaboration with appropriate care team members and the patient's provider was performed as part of comprehensive patient evaluation and provision of chronic care management services.   Social determinants of health: risk of social isolation; risk of tobacco use; risk of financial strain; risk of stress; risk of physical inactivity; risk of food insecurity; risk of housing needs     Chronic Care Management from 02/18/2019 in [place]  PHQ-9 Total Score  8      GAD 7 : Generalized Anxiety Score 02/18/2019  Nervous, Anxious, on Edge 1  Control/stop worrying 1  Worry too much - different things 1  Trouble relaxing 1  Restless 0  Easily annoyed or irritable 0  Afraid - awful might happen 1  Total GAD 7 Score 5  Anxiety Difficulty Somewhat difficult   Medications    (very important)  New medications from outside sources are available for reconciliation   amLODipine (NORVASC) 10 MG tablet aspirin 81 MG tablet atorvastatin (LIPITOR) 20 MG tablet baclofen (LIORESAL) 10 MG tablet blood glucose meter kit and supplies Blood Glucose Monitoring Suppl (BLOOD GLUCOSE MONITOR SYSTEM) w/Device KIT diazepam (VALIUM) 5 MG tablet DULoxetine (CYMBALTA) 60 MG capsule finasteride (PROSCAR) 5 MG tablet furosemide (LASIX) 20 MG tablet glucose blood (ONE TOUCH ULTRA TEST) test strip Insulin Glargine, 2 Unit Dial, (TOUJEO MAX SOLOSTAR) 300 UNIT/ML SOPN insulin lispro (HUMALOG) 100 UNIT/ML KwikPen Insulin Pen Needle (ULTICARE MICRO PEN NEEDLES) 32G X 4 MM MISC losartan (COZAAR) 100 MG  tablet meloxicam (MOBIC) 7.5 MG tablet metFORMIN (GLUCOPHAGE-XR) 500 MG 24 hr tablet olmesartan (BENICAR) 20 MG tablet OneTouch Delica Lancets 16X MISC ONETOUCH ULTRA test strip oxybutynin (DITROPAN XL) 10 MG 24 hr tablet tamsulosin (FLOMAX) 0.4 MG CAPS capsule Vitamin D, Ergocalciferol, (DRISDOL) 1.25 MG (50000 UT) CAPS capsule  GOAL:   Client stated: "I want to work on my anxiety symptoms and managing anxiety" (pt-stated)     Current Barriers:  Mental Health issues of client with chronic diagnoses of GERD, Anxiety, Type 2 DM, HTN, and Hyperlipidemia Financial challenges Social isolation  Clinical Social Work Clinical Goal(s):  Over the next 30 days, client will work with LCSW to address concerns related to anxiety and anxiety management for client  Interventions:  Previously encouraged client to communicate with RNCM to discuss nursing needs of client Talked previously with client about client needs Talked with client previously about medication procurement for client Talked previously with client about ambulation needs of client Talked previously with client about pain issues of client Talked with client about recent death of his sister  Self Care Activities: Attends scheduled provider appointments Self administers prescribed medications Perform ADLs  Plan: Client to attend scheduled medical appointments Client to communicate with RN CM to discuss nursing needs of client LCSW to call client in 4 weeks to discuss anxiety symptoms of client and client management of anxiety symptoms  Please see past updates related to this goal by clicking on the "Past Updates" button in the selected goal       Follow Up Plan: LCSW to call client in next 4 weeks to talk with  client about anxiety symptoms of client and client management of anxiety symptoms  [name].Everley Evora MSW, LCSW Licensed Clinical Social Worker [facility] Care Management [phone]  I  have reviewed and agree with the above documentation.   [name], FNP

## 2020-01-06 NOTE — Patient Instructions (Addendum)
Licensed Clinical Social Worker Visit Information  Goals we discussed today:  Goals    . Client stated: "I want to work on my anxiety symptoms and managing anxiety" (pt-stated)     Current Barriers:  [name] Mental Health issues of client with chronic diagnoses of GERD, Anxiety, Type 2 DM, HTN, and Hyperlipidemia . Financial challenges . Social isolation  Clinical Social Work Clinical Goal(s):  [name] Over the next 30 days, client will work with LCSW to address concerns related to anxiety and anxiety management for client  Interventions:   Previously encouraged client to communicate with RNCM to discuss nursing needs of client  [place] client about client needs  Talked with clientabout medication procurement for client  Talked previously with client about ambulation needs of client  Talked previously with client about pain issues of client  Talked with client about recent death of his sister  Self Care Activities: Attends scheduled provider appointments Self administers prescribed medications Perform ADLs  Plan: Client to attend scheduled medical appointments Client to communicate with RN CM to discuss nursing needs of client LCSW to call client in 4 weeks to discuss anxiety symptoms of client and client management of anxiety symptoms  Please see past updates related to this goal by clicking on the "Past Updates" button in the selected goal        Materials Provided:  No  Follow Up Plan: LCSW to call client in next 4 weeks to discuss anxiety symptoms of client and client management of anxiety symptoms  The patient verbalized understanding of instructions provided today and declined a print copy of patient instruction materials.   [name].[name] MSW, LCSW Licensed Clinical Social Worker [facility] Care Management [phone]

## 2020-01-07 ENCOUNTER — Other Ambulatory Visit: Payer: Self-pay | Admitting: Family

## 2020-01-07 DIAGNOSIS — G8929 Other chronic pain: Secondary | ICD-10-CM

## 2020-01-11 ENCOUNTER — Telehealth: Payer: Self-pay | Admitting: Family

## 2020-01-11 NOTE — Telephone Encounter (Signed)
Patient aware do not have a message where anyone has called.

## 2020-01-13 ENCOUNTER — Telehealth: Payer: Self-pay | Admitting: Family

## 2020-01-13 ENCOUNTER — Other Ambulatory Visit: Payer: Self-pay | Admitting: *Deleted

## 2020-01-13 DIAGNOSIS — E119 Type 2 diabetes mellitus without complications: Secondary | ICD-10-CM

## 2020-01-13 DIAGNOSIS — Z794 Long term (current) use of insulin: Secondary | ICD-10-CM

## 2020-01-13 MED ORDER — BLOOD GLUCOSE MONITOR SYSTEM W/DEVICE KIT: PACK | 0 refills | Status: DC

## 2020-01-13 MED ORDER — TOUJEO MAX SOLOSTAR 300 UNIT/ML ~~LOC~~ SOPN: 10.0000 [IU] | PEN_INJECTOR | Freq: Every day | SUBCUTANEOUS | 0 refills | Status: DC

## 2020-01-13 NOTE — Telephone Encounter (Signed)
Pt aware refill sent to pharmacy appt made for 01/31/20

## 2020-01-13 NOTE — Telephone Encounter (Signed)
What is the name of the medication? Trujeo  Have you contacted your pharmacy to request a refill? No  Which pharmacy would you like this sent to? [facility]   Patient notified that their request is being sent to the clinical staff for review and that they should receive a call once it is complete. If they do not receive a call within 24 hours they can check with their pharmacy or our office.   Lenna Gilford' pt. It is cheaper at different pharmacy, just this RX.

## 2020-01-14 ENCOUNTER — Telehealth: Payer: Self-pay | Admitting: Family

## 2020-01-14 NOTE — Telephone Encounter (Signed)
No voice mail on patient's phone.

## 2020-01-14 NOTE — Telephone Encounter (Signed)
[facility]  - she re-ran the rx for test strips: Insurance will not pay for strips until 01/17/20. [facility]  [facility] count - out of pocket would be $35   Pt called - he will have to wait and fill on 18 th - he can not POP for this
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
## 2020-01-20 ENCOUNTER — Other Ambulatory Visit: Payer: Self-pay | Admitting: Family

## 2020-01-20 DIAGNOSIS — E785 Hyperlipidemia, unspecified: Secondary | ICD-10-CM

## 2020-01-20 DIAGNOSIS — E1169 Type 2 diabetes mellitus with other specified complication: Secondary | ICD-10-CM

## 2020-01-28 ENCOUNTER — Other Ambulatory Visit: Payer: Self-pay

## 2020-01-31 ENCOUNTER — Ambulatory Visit (INDEPENDENT_AMBULATORY_CARE_PROVIDER_SITE_OTHER): Payer: Medicare Other | Admitting: Family

## 2020-01-31 ENCOUNTER — Encounter: Payer: Self-pay | Admitting: Family

## 2020-01-31 ENCOUNTER — Other Ambulatory Visit: Payer: Self-pay

## 2020-01-31 ENCOUNTER — Telehealth: Payer: Self-pay | Admitting: Family

## 2020-01-31 VITALS — BP 167/76 | HR 93 | Temp 98.6°F | Ht 70.0 in | Wt 228.6 lb

## 2020-01-31 DIAGNOSIS — I10 Essential (primary) hypertension: Secondary | ICD-10-CM

## 2020-01-31 DIAGNOSIS — E119 Type 2 diabetes mellitus without complications: Secondary | ICD-10-CM

## 2020-01-31 DIAGNOSIS — Z794 Long term (current) use of insulin: Secondary | ICD-10-CM

## 2020-01-31 DIAGNOSIS — E785 Hyperlipidemia, unspecified: Secondary | ICD-10-CM

## 2020-01-31 DIAGNOSIS — I152 Hypertension secondary to endocrine disorders: Secondary | ICD-10-CM

## 2020-01-31 DIAGNOSIS — K219 Gastro-esophageal reflux disease without esophagitis: Secondary | ICD-10-CM

## 2020-01-31 DIAGNOSIS — M545 Low back pain: Secondary | ICD-10-CM | POA: Diagnosis not present

## 2020-01-31 DIAGNOSIS — N401 Enlarged prostate with lower urinary tract symptoms: Secondary | ICD-10-CM

## 2020-01-31 DIAGNOSIS — Z79899 Other long term (current) drug therapy: Secondary | ICD-10-CM

## 2020-01-31 DIAGNOSIS — E1159 Type 2 diabetes mellitus with other circulatory complications: Secondary | ICD-10-CM

## 2020-01-31 DIAGNOSIS — F419 Anxiety disorder, unspecified: Secondary | ICD-10-CM

## 2020-01-31 DIAGNOSIS — F1320 Sedative, hypnotic or anxiolytic dependence, uncomplicated: Secondary | ICD-10-CM | POA: Diagnosis not present

## 2020-01-31 DIAGNOSIS — R351 Nocturia: Secondary | ICD-10-CM

## 2020-01-31 DIAGNOSIS — G8929 Other chronic pain: Secondary | ICD-10-CM

## 2020-01-31 DIAGNOSIS — E1169 Type 2 diabetes mellitus with other specified complication: Secondary | ICD-10-CM

## 2020-01-31 DIAGNOSIS — H401190 Primary open-angle glaucoma, unspecified eye, stage unspecified: Secondary | ICD-10-CM

## 2020-01-31 LAB — BAYER DCA HB A1C WAIVED: HB A1C (BAYER DCA - WAIVED): 8.6 % — ABNORMAL HIGH (ref <7.0)

## 2020-01-31 MED ORDER — AMLODIPINE BESYLATE 10 MG PO TABS: 10.0000 mg | ORAL_TABLET | Freq: Every day | ORAL | 2 refills | Status: DC

## 2020-01-31 MED ORDER — DIAZEPAM 5 MG PO TABS: ORAL_TABLET | ORAL | 2 refills | Status: DC

## 2020-01-31 MED ORDER — DULOXETINE HCL 30 MG PO CPEP: 30.0000 mg | ORAL_CAPSULE | Freq: Every day | ORAL | 3 refills | Status: DC
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
## 2020-01-31 NOTE — Telephone Encounter (Signed)
Referral placed.

## 2020-01-31 NOTE — Patient Instructions (Signed)

## 2020-01-31 NOTE — Telephone Encounter (Signed)
REFERRAL REQUEST Telephone Note  What type of referral do you need? Eye [facility] Dr. Katy Fitch [phone] [address] [place]  Have you been seen at our office for this problem? Has had eye problems all his life (Advise that they will likely need an appointment with their PCP before a referral can be done)  Is there a particular doctor or location that you prefer? Dr. Katy Fitch  Patient notified that referrals can take up to a week or longer to process. If they haven't heard anything within a week they should call back and speak with the referral department.

## 2020-02-01 LAB — CMP14+EGFR
ALT: 37 IU/L (ref 0–44)
AST: 32 IU/L (ref 0–40)
Albumin/Globulin Ratio: 2 (ref 1.2–2.2)
Albumin: 5.1 g/dL — ABNORMAL HIGH (ref 3.6–4.6)
Alkaline Phosphatase: 95 IU/L (ref 39–117)
BUN/Creatinine Ratio: 13 (ref 10–24)
BUN: 11 mg/dL (ref 8–27)
Bilirubin Total: 0.3 mg/dL (ref 0.0–1.2)
CO2: 22 mmol/L (ref 20–29)
Calcium: 10 mg/dL (ref 8.6–10.2)
Chloride: 100 mmol/L (ref 96–106)
Creatinine, Ser: 0.83 mg/dL (ref 0.76–1.27)
GFR calc Af Amer: 93 mL/min/{1.73_m2} (ref >59)
GFR calc non Af Amer: 81 mL/min/{1.73_m2} (ref >59)
Globulin, Total: 2.6 g/dL (ref 1.5–4.5)
Glucose: 188 mg/dL — ABNORMAL HIGH (ref 65–99)
Potassium: 4 mmol/L (ref 3.5–5.2)
Sodium: 138 mmol/L (ref 134–144)
Total Protein: 7.7 g/dL (ref 6.0–8.5)

## 2020-02-01 LAB — CBC WITH DIFFERENTIAL/PLATELET
Basophils Absolute: 0 10*3/uL (ref 0.0–0.2)
Basos: 1 %
EOS (ABSOLUTE): 0.1 10*3/uL (ref 0.0–0.4)
Eos: 1 %
Hematocrit: 45 % (ref 37.5–51.0)
Hemoglobin: 15.5 g/dL (ref 13.0–17.7)
Immature Grans (Abs): 0 10*3/uL (ref 0.0–0.1)
Immature Granulocytes: 0 %
Lymphocytes Absolute: 1.8 10*3/uL (ref 0.7–3.1)
Lymphs: 29 %
MCH: 30.4 pg (ref 26.6–33.0)
MCHC: 34.4 g/dL (ref 31.5–35.7)
MCV: 88 fL (ref 79–97)
Monocytes Absolute: 0.5 10*3/uL (ref 0.1–0.9)
Monocytes: 8 %
Neutrophils Absolute: 3.9 10*3/uL (ref 1.4–7.0)
Neutrophils: 61 %
Platelets: 283 10*3/uL (ref 150–450)
RBC: 5.1 x10E6/uL (ref 4.14–5.80)
RDW: 14.2 % (ref 11.6–15.4)
WBC: 6.4 10*3/uL (ref 3.4–10.8)

## 2020-02-02 ENCOUNTER — Ambulatory Visit (INDEPENDENT_AMBULATORY_CARE_PROVIDER_SITE_OTHER): Payer: Medicare Other | Admitting: Licensed Clinical Social Worker

## 2020-02-02 DIAGNOSIS — E119 Type 2 diabetes mellitus without complications: Secondary | ICD-10-CM

## 2020-02-02 DIAGNOSIS — K219 Gastro-esophageal reflux disease without esophagitis: Secondary | ICD-10-CM

## 2020-02-02 DIAGNOSIS — E1159 Type 2 diabetes mellitus with other circulatory complications: Secondary | ICD-10-CM

## 2020-02-02 DIAGNOSIS — F419 Anxiety disorder, unspecified: Secondary | ICD-10-CM

## 2020-02-02 DIAGNOSIS — E785 Hyperlipidemia, unspecified: Secondary | ICD-10-CM

## 2020-02-02 DIAGNOSIS — E1169 Type 2 diabetes mellitus with other specified complication: Secondary | ICD-10-CM

## 2020-02-02 DIAGNOSIS — Z794 Long term (current) use of insulin: Secondary | ICD-10-CM

## 2020-02-02 NOTE — Patient Instructions (Addendum)
Licensed Clinical Social Worker Visit Information  Goals we discussed today:  Goals    . Client stated: "I want to work on my anxiety symptoms and managing anxiety" (pt-stated)     Current Barriers:  [name] Mental Health issues of client with chronic diagnoses of GERD, Anxiety, Type 2 DM, HTN, and Hyperlipidemia . Financial challenges . Social isolation  Clinical Social Work Clinical Goal(s):  [name] Over the next 30 days, client will work with LCSW to address concerns related to anxiety and anxiety management for client  Interventions:  Previously encouraged client to communicate with RNCM to discuss nursing needs of client  Talked previously with client about client needs  Talked with client previously about medication procurement for client  Talked previously with client about ambulation needs of client  Talked previously with client about pain issues of client  Talked with client about recent deaths of two of his sisters  Self Care Activities: Attends scheduled provider appointments Self administers prescribed medications Perform ADLs  Plan: Client to attend scheduled medical appointments Client to communicate with RN CM to discuss nursing needs of client LCSW to call client in 4 weeks to discuss anxiety symptoms of client and client management of anxiety symptoms  Please see past updates related to this goal by clicking on the "Past Updates" button in the selected goal        Materials Provided: No  Follow Up Plan: LCSW to call client in next 4 weeks to discuss anxiety symptoms of client and client management of anxiety symptoms  The patient verbalized understanding of instructions provided today and declined a print copy of patient instruction materials.   [name].Melbert Botelho MSW, LCSW Licensed Clinical Social Worker [facility] Care Management [phone]

## 2020-02-02 NOTE — Chronic Care Management (AMB) (Addendum)
Care Management Note   [name] is a 86 y.o. year old male who is a primary care patient of [name], FNP. The CM team was consulted for assistance with chronic disease management and care coordination.   I reached out to [facility] by phone today.   Review of patient status, including review of consultants reports, relevant laboratory and other test results, and collaboration with appropriate care team members and the patient's provider was performed as part of comprehensive patient evaluation and provision of chronic care management services.   Social determinants of health: risk of social isolation; risk of stress; risk of physical inactivity; risk of food insecurity    Office Visit from 01/31/2020 in [facility]  PHQ-9 Total Score  3      GAD 7 : Generalized Anxiety Score 01/31/2020 02/18/2019  Nervous, Anxious, on Edge 0 1  Control/stop worrying 0 1  Worry too much - different things 0 1  Trouble relaxing 0 1  Restless 0 0  Easily annoyed or irritable 1 0  Afraid - awful might happen 0 1  Total GAD 7 Score 1 5  Anxiety Difficulty - Somewhat difficult   Medications    amLODipine (NORVASC) 10 MG tablet aspirin 81 MG tablet atorvastatin (LIPITOR) 20 MG tablet baclofen (LIORESAL) 10 MG tablet blood glucose meter kit and supplies Blood Glucose Monitoring Suppl (BLOOD GLUCOSE MONITOR SYSTEM) w/Device KIT diazepam (VALIUM) 5 MG tablet DULoxetine (CYMBALTA) 30 MG capsule finasteride (PROSCAR) 5 MG tablet furosemide (LASIX) 20 MG tablet glucose blood (ONE TOUCH ULTRA TEST) test strip Insulin Glargine, 2 Unit Dial, (TOUJEO MAX SOLOSTAR) 300 UNIT/ML SOPN insulin lispro (HUMALOG) 100 UNIT/ML KwikPen Insulin Pen Needle (ULTICARE MICRO PEN NEEDLES) 32G X 4 MM MISC losartan (COZAAR) 100 MG tablet meloxicam (MOBIC) 7.5 MG tablet metFORMIN (GLUCOPHAGE-XR) 500 MG 24 hr tablet OneTouch Delica Lancets 09F MISC ONETOUCH ULTRA test strip oxybutynin  (DITROPAN XL) 10 MG 24 hr tablet tamsulosin (FLOMAX) 0.4 MG CAPS capsule Vitamin D, Ergocalciferol, (DRISDOL) 1.25 MG (50000 UT) CAPS capsule  GOAL:    Client stated: "I want to work on my anxiety symptoms and managing anxiety" (pt-stated)     Current Barriers:  Mental Health issues of client with chronic diagnoses of GERD, Anxiety, Type 2 DM, HTN, and Hyperlipidemia Financial challenges Social isolation  Clinical Social Work Clinical Goal(s):  Over the next 30 days, client will work with LCSW to address concerns related to anxiety and anxiety management for client  Interventions:  Previously encouraged client to communicate with RNCM to discuss nursing needs of client Talked previously with client about client needs Talked with client previously about medication procurement for client Talked previously with client about ambulation needs of client Talked previously with client about pain issues of client Talked with client about recent deaths of two of his sisters  Self Care Activities: Attends scheduled provider appointments Self administers prescribed medications Perform ADLs  Plan: Client to attend scheduled medical appointments Client to communicate with RN CM to discuss nursing needs of client LCSW to call client in 4 weeks to discuss anxiety symptoms of client and client management of anxiety symptoms  Please see past updates related to this goal by clicking on the "Past Updates" button in the selected goal            Follow Up Plan: LCSW to call client in next 4 weeks to talk with client about anxiety symptoms of client and about client management of anxiety symptoms  [name].Symphani Eckstrom MSW, LCSW Licensed Clinical Social Worker [facility] Care Management [phone]  I have reviewed and agree with the above documentation.   [name], FNP

## 2020-02-03 DIAGNOSIS — H4089 Other specified glaucoma: Secondary | ICD-10-CM | POA: Diagnosis not present

## 2020-02-03 DIAGNOSIS — H0102A Squamous blepharitis right eye, upper and lower eyelids: Secondary | ICD-10-CM | POA: Diagnosis not present

## 2020-02-03 DIAGNOSIS — H0102B Squamous blepharitis left eye, upper and lower eyelids: Secondary | ICD-10-CM | POA: Diagnosis not present

## 2020-02-03 DIAGNOSIS — H40012 Open angle with borderline findings, low risk, left eye: Secondary | ICD-10-CM | POA: Diagnosis not present

## 2020-02-03 DIAGNOSIS — E119 Type 2 diabetes mellitus without complications: Secondary | ICD-10-CM | POA: Diagnosis not present

## 2020-02-03 LAB — HM DIABETES EYE EXAM

## 2020-02-04 ENCOUNTER — Other Ambulatory Visit: Payer: Self-pay | Admitting: Family

## 2020-02-04 DIAGNOSIS — E119 Type 2 diabetes mellitus without complications: Secondary | ICD-10-CM

## 2020-02-04 MED ORDER — TOUJEO MAX SOLOSTAR 300 UNIT/ML ~~LOC~~ SOPN: 15.0000 [IU] | PEN_INJECTOR | Freq: Every day | SUBCUTANEOUS | 0 refills | Status: DC

## 2020-02-17 ENCOUNTER — Telehealth: Payer: Medicare Other

## 2020-02-17 ENCOUNTER — Other Ambulatory Visit: Payer: Self-pay

## 2020-02-22 ENCOUNTER — Ambulatory Visit: Payer: Medicare Other | Admitting: *Deleted

## 2020-02-22 DIAGNOSIS — E1169 Type 2 diabetes mellitus with other specified complication: Secondary | ICD-10-CM | POA: Diagnosis not present

## 2020-02-22 DIAGNOSIS — E785 Hyperlipidemia, unspecified: Secondary | ICD-10-CM | POA: Diagnosis not present

## 2020-02-22 DIAGNOSIS — E119 Type 2 diabetes mellitus without complications: Secondary | ICD-10-CM

## 2020-02-22 DIAGNOSIS — I10 Essential (primary) hypertension: Secondary | ICD-10-CM | POA: Diagnosis not present

## 2020-02-22 DIAGNOSIS — E1159 Type 2 diabetes mellitus with other circulatory complications: Secondary | ICD-10-CM | POA: Diagnosis not present

## 2020-02-22 DIAGNOSIS — Z794 Long term (current) use of insulin: Secondary | ICD-10-CM
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
## 2020-02-22 NOTE — Patient Instructions (Signed)
Visit Information  Goals Addressed            This Visit's Progress     Patient Stated   . "I want to get control of my blood sugar" (pt-stated)       Current Barriers:  . Financial Constraints.  . Knowledge Deficits related to diabetes self management   . Anxiety  Nurse Case Manager Clinical Goal(s):  [name] Over the next 30 days, patient will work with CCM team regarding prescription assistance to help pay for medications . Over the next 90 days, patient will keep all scheduled medical appointments  Interventions:  . Chart reviewed . Talked with patient by telephone o Patient reports having some trouble paying for his medications. We have attempted to help with prescription assistance in the past but have been unsuccessful.  . Medications reviewed and discussed with patient . Upcoming appointments reviewed . Encouraged patient to take medications as prescribed . Referral to Care Guide for prescription assistance  Patient Self Care Activities:  . Performs ADL's independently . Performs IADL's independently  Please see past updates related to this goal by clicking on the "Past Updates" button in the selected goal        Other   . Handicap Placard       CARE PLAN ENTRY (see longtitudinal plan of care for additional care plan information)  Current Barriers:  . Lacks caregiver support.  . Film/video editor.  . Cognitive Deficits  Nurse Case Manager Clinical Goal(s):  [name] Over the next 7 days, patient will pick up handicap placard renewal form from [facility]  Interventions:  . Talked with patient by telephone . Chart reviewed . Collaborated with Clinical and front office staff at [facility] regarding form . Advised patient that form is available for pickup at the front desk . Advised to bring receipt where he paid for the form to be filled out  Patient Self Care Activities:  . Performs ADL's independently . Performs IADL's independently  Initial goal documentation         The care management team will reach out to the patient again over the next 30 days.   [name], BSN, RN-BC Embedded Chronic Care Manager [facility] [place] Management Direct Dial: [phone]   The patient verbalized understanding of instructions provided today and declined a print copy of patient instruction materials.

## 2020-02-28 ENCOUNTER — Telehealth: Payer: Self-pay

## 2020-02-28 NOTE — Telephone Encounter (Signed)
02/28/2020 Unable to leave message on cell phone, voicemail not set-up and home number is no longer in service.  Will attempt to call cell number this week. [name] [phone]

## 2020-02-29 ENCOUNTER — Telehealth: Payer: Self-pay

## 2020-02-29 ENCOUNTER — Other Ambulatory Visit: Payer: Self-pay | Admitting: Family

## 2020-02-29 DIAGNOSIS — N401 Enlarged prostate with lower urinary tract symptoms: Secondary | ICD-10-CM

## 2020-03-01 ENCOUNTER — Telehealth: Payer: Self-pay

## 2020-03-01 NOTE — Telephone Encounter (Signed)
03/01/2020 Unable to leave message on cell phone, voicemail not set-up and home number is no longer in service. Will attempt to call again this week. Ambrose Mantle [phone]

## 2020-03-03 ENCOUNTER — Telehealth: Payer: Self-pay

## 2020-03-03 NOTE — Telephone Encounter (Signed)
03/03/2020  3rd attempt to contact patient and daughter.  Unable to leave message on cell phone, voicemail not set-up and home number is no longer in service.  Also called daughter's number voicemail not set-up. [phone]

## 2020-03-16 ENCOUNTER — Telehealth: Payer: Medicare Other

## 2020-03-20 ENCOUNTER — Other Ambulatory Visit: Payer: Self-pay | Admitting: Family Medicine

## 2020-03-20 DIAGNOSIS — E119 Type 2 diabetes mellitus without complications: Secondary | ICD-10-CM

## 2020-04-07 DIAGNOSIS — H0102B Squamous blepharitis left eye, upper and lower eyelids: Secondary | ICD-10-CM | POA: Diagnosis not present

## 2020-04-07 DIAGNOSIS — H0102A Squamous blepharitis right eye, upper and lower eyelids: Secondary | ICD-10-CM | POA: Diagnosis not present

## 2020-04-07 DIAGNOSIS — H10412 Chronic giant papillary conjunctivitis, left eye: Secondary | ICD-10-CM | POA: Diagnosis not present

## 2020-04-07 DIAGNOSIS — H5202 Hypermetropia, left eye: Secondary | ICD-10-CM | POA: Diagnosis not present

## 2020-04-21 ENCOUNTER — Other Ambulatory Visit: Payer: Self-pay | Admitting: Family

## 2020-04-21 DIAGNOSIS — M545 Low back pain, unspecified: Secondary | ICD-10-CM

## 2020-04-21 DIAGNOSIS — G8929 Other chronic pain: Secondary | ICD-10-CM

## 2020-05-08 ENCOUNTER — Ambulatory Visit (INDEPENDENT_AMBULATORY_CARE_PROVIDER_SITE_OTHER): Payer: Medicare Other | Admitting: Family

## 2020-05-08 ENCOUNTER — Other Ambulatory Visit: Payer: Self-pay

## 2020-05-08 ENCOUNTER — Encounter: Payer: Self-pay | Admitting: Family

## 2020-05-08 VITALS — BP 168/89 | HR 91 | Temp 98.3°F | Ht 70.0 in | Wt 225.8 lb

## 2020-05-08 DIAGNOSIS — E1159 Type 2 diabetes mellitus with other circulatory complications: Secondary | ICD-10-CM

## 2020-05-08 DIAGNOSIS — N401 Enlarged prostate with lower urinary tract symptoms: Secondary | ICD-10-CM

## 2020-05-08 DIAGNOSIS — F419 Anxiety disorder, unspecified: Secondary | ICD-10-CM | POA: Diagnosis not present

## 2020-05-08 DIAGNOSIS — M545 Low back pain: Secondary | ICD-10-CM

## 2020-05-08 DIAGNOSIS — I10 Essential (primary) hypertension: Secondary | ICD-10-CM

## 2020-05-08 DIAGNOSIS — E1169 Type 2 diabetes mellitus with other specified complication: Secondary | ICD-10-CM

## 2020-05-08 DIAGNOSIS — Z79899 Other long term (current) drug therapy: Secondary | ICD-10-CM | POA: Diagnosis not present

## 2020-05-08 DIAGNOSIS — Z794 Long term (current) use of insulin: Secondary | ICD-10-CM

## 2020-05-08 DIAGNOSIS — K219 Gastro-esophageal reflux disease without esophagitis: Secondary | ICD-10-CM | POA: Diagnosis not present

## 2020-05-08 DIAGNOSIS — E785 Hyperlipidemia, unspecified: Secondary | ICD-10-CM

## 2020-05-08 DIAGNOSIS — E119 Type 2 diabetes mellitus without complications: Secondary | ICD-10-CM

## 2020-05-08 DIAGNOSIS — R351 Nocturia: Secondary | ICD-10-CM

## 2020-05-08 DIAGNOSIS — F1320 Sedative, hypnotic or anxiolytic dependence, uncomplicated: Secondary | ICD-10-CM

## 2020-05-08 DIAGNOSIS — G8929 Other chronic pain: Secondary | ICD-10-CM

## 2020-05-08 DIAGNOSIS — G40909 Epilepsy, unspecified, not intractable, without status epilepticus: Secondary | ICD-10-CM

## 2020-05-08 LAB — BAYER DCA HB A1C WAIVED: HB A1C (BAYER DCA - WAIVED): 8 % — ABNORMAL HIGH (ref <7.0)

## 2020-05-08 MED ORDER — GABAPENTIN 100 MG PO CAPS: 100.0000 mg | ORAL_CAPSULE | Freq: Two times a day (BID) | ORAL | 3 refills | Status: DC

## 2020-05-08 MED ORDER — DIAZEPAM 5 MG PO TABS: ORAL_TABLET | ORAL | 4 refills | Status: DC
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
## 2020-05-08 NOTE — Patient Instructions (Signed)

## 2020-05-09 ENCOUNTER — Other Ambulatory Visit: Payer: Self-pay | Admitting: Family

## 2020-05-09 ENCOUNTER — Telehealth: Payer: Self-pay | Admitting: Family Medicine

## 2020-05-09 DIAGNOSIS — E119 Type 2 diabetes mellitus without complications: Secondary | ICD-10-CM

## 2020-05-09 DIAGNOSIS — Z794 Long term (current) use of insulin: Secondary | ICD-10-CM

## 2020-05-09 LAB — CMP14+EGFR
ALT: 43 IU/L (ref 0–44)
AST: 34 IU/L (ref 0–40)
Albumin/Globulin Ratio: 1.8 (ref 1.2–2.2)
Albumin: 5.1 g/dL — ABNORMAL HIGH (ref 3.6–4.6)
Alkaline Phosphatase: 89 IU/L (ref 39–117)
BUN/Creatinine Ratio: 7 — ABNORMAL LOW (ref 10–24)
BUN: 7 mg/dL — ABNORMAL LOW (ref 8–27)
Bilirubin Total: 0.3 mg/dL (ref 0.0–1.2)
CO2: 22 mmol/L (ref 20–29)
Calcium: 10.2 mg/dL (ref 8.6–10.2)
Chloride: 101 mmol/L (ref 96–106)
Creatinine, Ser: 0.97 mg/dL (ref 0.76–1.27)
GFR calc Af Amer: 83 mL/min/{1.73_m2} (ref >59)
GFR calc non Af Amer: 71 mL/min/{1.73_m2} (ref >59)
Globulin, Total: 2.8 g/dL (ref 1.5–4.5)
Glucose: 176 mg/dL — ABNORMAL HIGH (ref 65–99)
Potassium: 4.4 mmol/L (ref 3.5–5.2)
Sodium: 139 mmol/L (ref 134–144)
Total Protein: 7.9 g/dL (ref 6.0–8.5)

## 2020-05-09 LAB — CBC WITH DIFFERENTIAL/PLATELET
Basophils Absolute: 0 10*3/uL (ref 0.0–0.2)
Basos: 0 %
EOS (ABSOLUTE): 0.2 10*3/uL (ref 0.0–0.4)
Eos: 2 %
Hematocrit: 43 % (ref 37.5–51.0)
Hemoglobin: 14.4 g/dL (ref 13.0–17.7)
Immature Grans (Abs): 0 10*3/uL (ref 0.0–0.1)
Immature Granulocytes: 1 %
Lymphocytes Absolute: 1.7 10*3/uL (ref 0.7–3.1)
Lymphs: 21 %
MCH: 29.8 pg (ref 26.6–33.0)
MCHC: 33.5 g/dL (ref 31.5–35.7)
MCV: 89 fL (ref 79–97)
Monocytes Absolute: 0.6 10*3/uL (ref 0.1–0.9)
Monocytes: 8 %
Neutrophils Absolute: 5.5 10*3/uL (ref 1.4–7.0)
Neutrophils: 68 %
Platelets: 322 10*3/uL (ref 150–450)
RBC: 4.83 x10E6/uL (ref 4.14–5.80)
RDW: 14.3 % (ref 11.6–15.4)
WBC: 8 10*3/uL (ref 3.4–10.8)

## 2020-05-09 MED ORDER — TOUJEO MAX SOLOSTAR 300 UNIT/ML ~~LOC~~ SOPN: 18.0000 [IU] | PEN_INJECTOR | Freq: Every day | SUBCUTANEOUS | 0 refills | Status: DC

## 2020-05-09 NOTE — Telephone Encounter (Signed)
Patient states that he did not know anything about christy wanting patient to see clinical pharmacist.  He also states that he can get his sugars down by himself and doesn't need to see anyone else about this other than Dale Dun, FNP

## 2020-05-09 NOTE — Telephone Encounter (Signed)
[name] can help with getting his medications. His blood glucose is too high.

## 2020-05-09 NOTE — Telephone Encounter (Signed)
Patient still refuses to see Almyra Free.  States he will eat better he did not tell you he has been eating a lot of sugar. FYI

## 2020-05-11 ENCOUNTER — Other Ambulatory Visit: Payer: Self-pay

## 2020-05-11 ENCOUNTER — Ambulatory Visit (INDEPENDENT_AMBULATORY_CARE_PROVIDER_SITE_OTHER): Payer: Medicare Other | Admitting: Pharmacist

## 2020-05-11 DIAGNOSIS — M5440 Lumbago with sciatica, unspecified side: Secondary | ICD-10-CM

## 2020-05-11 DIAGNOSIS — E119 Type 2 diabetes mellitus without complications: Secondary | ICD-10-CM | POA: Diagnosis not present

## 2020-05-11 DIAGNOSIS — Z794 Long term (current) use of insulin: Secondary | ICD-10-CM

## 2020-05-11 DIAGNOSIS — G8929 Other chronic pain: Secondary | ICD-10-CM | POA: Diagnosis not present

## 2020-05-11 MED ORDER — KETOROLAC TROMETHAMINE 60 MG/2ML IM SOLN
60.0000 mg | Freq: Once | INTRAMUSCULAR | Status: AC
  Administered 2020-05-11: 60 mg via INTRAMUSCULAR

## 2020-05-11 MED ORDER — DICLOFENAC SODIUM 1 % EX GEL: 2.0000 g | Freq: Four times a day (QID) | CUTANEOUS | 2 refills | Status: DC

## 2020-05-11 NOTE — Progress Notes (Signed)
05/11/2020 Name: [name] MRN: [id number] DOB: [birth year]  Referred by: [name], FNP Reason for referral: DIABETES  S:  60 YOM presents for diabetes evaluation, education, and management.  He appears to be in pain and walks with cane. He describes his pain as lower back/buttock pain.  Patient was referred and last seen by Primary Care Provider on 05/08/20. Insurance coverage/medication affordability: [employer] medicare  Patient MOSTLY adherent with medications.  Current diabetes medications include: Toujeo 18 units qhs, metformin 530m TID, insulin lispro PRN (not taking)  Current hypertension medications include: amlodipine, losartan  Current hyperlipidemia medications include: atorvastatin (not currently taking) -last LDL was 67 on 01/18/19-due  Patient denies hypoglycemic events.  Patient reported dietary habits: Eats 3 meals/day -Discussed DM healthy options for patient to eat -Avoid sugary drinks, desserts and high carbohydrate snacks (potato chips, crackers, etc)  Patient-reported exercise habits: unable to participate  Patient reports nocturia (nighttime urination).  Patient reports neuropathy (nerve pain).  Patient reports visual changes.  Patient denies self foot exams.   O:  Lab Results  Component Value Date   HGBA1C 8.0 (H) 05/08/2020   Current Outpatient Medications on File Prior to Visit  Medication Sig Dispense Refill  . ACCU-CHEK GUIDE test strip TEST BS TWICE A DAY DX E11.9 200 strip 3  . amLODipine (NORVASC) 10 MG tablet Take 1 tablet (10 mg total) by mouth daily. (Patient not taking: Reported on 05/08/2020) 90 tablet 2  . aspirin 81 MG tablet Take 81 mg by mouth daily. Takes EC aspirin    . atorvastatin (LIPITOR) 20 MG tablet Take 1 tablet (20 mg total) by mouth daily. (Patient not taking: Reported on 05/08/2020) 90 tablet 3  . baclofen (LIORESAL) 10 MG tablet TAKE 0.5 TABLET (5 MG) BY MOUTH 3 TIMES DAILY 135 tablet 1  . blood glucose meter  kit and supplies Dispense per insurance preference. Use up to four times daily as directed. E 11.9 1 each 0  . Blood Glucose Monitoring Suppl (BLOOD GLUCOSE MONITOR SYSTEM) w/Device KIT TEST BS BID Dx E11.9 1 kit 0  . diazepam (VALIUM) 5 MG tablet TAKE 1 TABLET 2 TIMES DAILY AS NEEDED 60 tablet 4  . DULoxetine (CYMBALTA) 30 MG capsule Take 1 capsule (30 mg total) by mouth daily. 90 capsule 3  . finasteride (PROSCAR) 5 MG tablet TAKE 1 TABLET (5 MG TOTAL) BY MOUTH AT BEDTIME. FOR PROSTATE 90 tablet 0  . furosemide (LASIX) 20 MG tablet TAKE 1 OR 2 TABLETS (20-40 MG TOTAL) BY MOUTH DAILY. 180 tablet 1  . gabapentin (NEURONTIN) 100 MG capsule Take 1 capsule (100 mg total) by mouth 2 (two) times daily. 60 capsule 3  . glucose blood (ONE TOUCH ULTRA TEST) test strip USE FOR TESTING up to 5 x a day 200 each 2  . insulin glargine, 2 Unit Dial, (TOUJEO MAX SOLOSTAR) 300 UNIT/ML Solostar Pen Inject 18 Units into the skin at bedtime. 12 pen 0  . insulin lispro (HUMALOG) 100 UNIT/ML KwikPen Inject 4 units sq as directed for blood sugar readings greater than 250. Call PCP if using more than 3 times a week. 3 mL 2  . Insulin Pen Needle (ULTICARE MICRO PEN NEEDLES) 32G X 4 MM MISC Check blood sugar 4 x daily and as needed for Dx: E11.9 100 each 10  . losartan (COZAAR) 100 MG tablet TAKE 1 TABLET BY MOUTH EVERY DAY 90 tablet 3  . meloxicam (MOBIC) 7.5 MG tablet TAKE 1 TABLET BY MOUTH  EVERY DAY 90 tablet 3  . metFORMIN (GLUCOPHAGE-XR) 500 MG 24 hr tablet TAKE 1 TABLET BY MOUTH 3 TIMES DAILY WITH MEALS 270 tablet 0  . OneTouch Delica Lancets 15Z MISC USE 2 TIMES A DAY Dx E11.9 200 each 3  . oxybutynin (DITROPAN XL) 10 MG 24 hr tablet Take 1 tablet (10 mg total) by mouth at bedtime. (Patient not taking: Reported on 05/08/2020) 90 tablet 1  . tamsulosin (FLOMAX) 0.4 MG CAPS capsule TAKE 2 CAPSULES (0.8 MG TOTAL) BY MOUTH AT BEDTIME. FOR PROSTATE. 180 capsule 0  . Vitamin D, Ergocalciferol, (DRISDOL) 1.25 MG (50000 UT)  CAPS capsule Takes on Sunday 12 capsule 3   No current facility-administered medications on file prior to visit.    Lipid Panel     Component Value Date/Time   CHOL 137 09/13/2019 0808   CHOL 116 05/19/2013 1045   TRIG 138 09/13/2019 0808   TRIG 149 10/13/2013 1252   TRIG 94 05/19/2013 1045   HDL 43 09/13/2019 0808   HDL 49 10/13/2013 1252   HDL 41 05/19/2013 1045   CHOLHDL 3.2 09/13/2019 0808   CHOLHDL 4.5 02/27/2012 0455   VLDL 33 02/27/2012 0455   LDLCALC 70 09/13/2019 0808   LDLCALC 67 10/13/2013 1252   LDLCALC 56 05/19/2013 1045   LDLDIRECT 89 12/02/2014 1250   Home fasting blood sugars: 120-165  2 hour post-meal/random blood sugars: n/a  A/P:  Diabetes T2DM currently uncontrolled. Patient is able to verbalize appropriate hypoglycemia management plan. Patient is mostly adherent with medication. Control is suboptimal due to diet/unable to participate in physical activity due to back/chronic pain.  -Increased dose of basal insulin TOUJEO (insulin GLARGINE) TO 20 UNITS NIGHTLY.   -Recommend patient use insulin lispro for BGs >250.  He is not currently using despite post prandials likely >200  -Continue metformin  -Extensively discussed pathophysiology of diabetes, recommended lifestyle interventions, dietary effects on blood sugar control  -highly encouraged patient to reduce sugar intake  -Counseled on s/sx of and management of hypoglycemia  -Voltaren gel RX given for pain (discussed with PCP, PCP ordered  -Toradol injection given as well in office today  -Next A1C anticipated 52-month  -Next Lipid panel was due 12/2019    Written patient instructions provided.  Total time in face to face counseling 29 minutes.   Follow up PCP Clinic Visit in November. Follow up w/ CCM RN CM in June PharmD will continue to follow    JRegina Eck PharmD, BCPS Clinical Pharmacist, [place] Phone [phone]

## 2020-05-17 ENCOUNTER — Encounter: Payer: Self-pay | Admitting: Pharmacist

## 2020-05-18 ENCOUNTER — Ambulatory Visit (INDEPENDENT_AMBULATORY_CARE_PROVIDER_SITE_OTHER): Payer: Medicare Other | Admitting: *Deleted

## 2020-05-18 DIAGNOSIS — Z0000 Encounter for general adult medical examination without abnormal findings: Secondary | ICD-10-CM | POA: Diagnosis not present
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
## 2020-05-27 ENCOUNTER — Other Ambulatory Visit: Payer: Self-pay | Admitting: Family

## 2020-05-27 DIAGNOSIS — R351 Nocturia: Secondary | ICD-10-CM

## 2020-05-30 ENCOUNTER — Other Ambulatory Visit: Payer: Self-pay | Admitting: Family

## 2020-05-30 DIAGNOSIS — Z794 Long term (current) use of insulin: Secondary | ICD-10-CM

## 2020-05-30 DIAGNOSIS — E559 Vitamin D deficiency, unspecified: Secondary | ICD-10-CM

## 2020-05-30 DIAGNOSIS — R351 Nocturia: Secondary | ICD-10-CM

## 2020-06-07 ENCOUNTER — Ambulatory Visit: Payer: Medicare Other | Admitting: *Deleted

## 2020-06-07 DIAGNOSIS — E1159 Type 2 diabetes mellitus with other circulatory complications: Secondary | ICD-10-CM

## 2020-06-07 DIAGNOSIS — Z794 Long term (current) use of insulin: Secondary | ICD-10-CM

## 2020-06-07 NOTE — Chronic Care Management (AMB) (Addendum)
  Chronic Care Management   Follow-up Note  06/07/2020 Name: [name] MRN: [id number] DOB: March 07, 1933  Referred by: [name], FNP Reason for referral : Chronic Care Management (RN follow up)   An unsuccessful telephone Follow-up was attempted today. The patient was referred to the case management team for assistance with care management and care coordination.   Follow Up Plan: The care management team will reach out to the patient again over the next 30 days.   [name], BSN, RN-BC Embedded Chronic Care Manager [facility] / [place] Management Direct Dial: [phone]   I have reviewed the CCM documentation and agree with the written assessment and plan of care.  [name], FNP

## 2020-06-08 ENCOUNTER — Telehealth: Payer: Self-pay | Admitting: Family

## 2020-06-08 ENCOUNTER — Other Ambulatory Visit: Payer: Self-pay | Admitting: *Deleted

## 2020-06-08 DIAGNOSIS — G8929 Other chronic pain: Secondary | ICD-10-CM

## 2020-06-08 NOTE — Telephone Encounter (Signed)
   Can you have patient schedule with PharmD?  Patient's mail box is full & cannot accept messages at this time  Will leave samples in the refrigerator in the meantime  He needs to bring in proof of annual household income--we can get him free medication straight through the drug company

## 2020-06-08 NOTE — Telephone Encounter (Signed)
Pt called to let Alyse Low know that he knows he is supposed to refill his medications but he currently cant afford to have them filled.

## 2020-06-08 NOTE — Telephone Encounter (Signed)
Can we give him some sample? Can he make an appointment with Almyra Free.

## 2020-06-08 NOTE — Telephone Encounter (Signed)
FYI only. No response necessary.

## 2020-06-09 ENCOUNTER — Ambulatory Visit (INDEPENDENT_AMBULATORY_CARE_PROVIDER_SITE_OTHER): Payer: Medicare Other | Admitting: Pharmacist

## 2020-06-09 ENCOUNTER — Encounter: Payer: Self-pay | Admitting: Pharmacist

## 2020-06-09 ENCOUNTER — Other Ambulatory Visit: Payer: Self-pay | Admitting: *Deleted

## 2020-06-09 ENCOUNTER — Other Ambulatory Visit: Payer: Self-pay

## 2020-06-09 DIAGNOSIS — G8929 Other chronic pain: Secondary | ICD-10-CM

## 2020-06-09 DIAGNOSIS — E119 Type 2 diabetes mellitus without complications: Secondary | ICD-10-CM | POA: Diagnosis not present

## 2020-06-09 DIAGNOSIS — R351 Nocturia: Secondary | ICD-10-CM

## 2020-06-09 DIAGNOSIS — Z794 Long term (current) use of insulin: Secondary | ICD-10-CM | POA: Diagnosis not present

## 2020-06-09 MED ORDER — GABAPENTIN 100 MG PO CAPS: 100.0000 mg | ORAL_CAPSULE | Freq: Two times a day (BID) | ORAL | 0 refills | Status: DC

## 2020-06-09 MED ORDER — TAMSULOSIN HCL 0.4 MG PO CAPS: 0.8000 mg | ORAL_CAPSULE | Freq: Every day | ORAL | 0 refills | Status: DC

## 2020-06-09 MED ORDER — TOUJEO MAX SOLOSTAR 300 UNIT/ML ~~LOC~~ SOPN: 10.0000 [IU] | PEN_INJECTOR | Freq: Every day | SUBCUTANEOUS | 0 refills | Status: DC

## 2020-06-09 MED ORDER — METFORMIN HCL ER 500 MG PO TB24: ORAL_TABLET | ORAL | 0 refills | Status: DC

## 2020-06-09 MED ORDER — MELOXICAM 7.5 MG PO TABS: 7.5000 mg | ORAL_TABLET | Freq: Every day | ORAL | 0 refills | Status: DC

## 2020-06-09 MED ORDER — BACLOFEN 10 MG PO TABS: ORAL_TABLET | ORAL | 1 refills | Status: DC

## 2020-06-09 NOTE — Progress Notes (Signed)
    06/09/2020 Name: [name] MRN: [id number] DOB: February 19, 1933  Referred by: [name], FNP Reason for referral: DIABETES  S:  37 YOM presents for diabetes evaluation, education, and management.  He appears to be in pain and walks with cane. He describes his pain as lower back/buttock pain.  Patient was referred and last seen by Primary Care Provider on 05/08/20. Insurance coverage/medication affordability: UHC medicare  Patient MOSTLY adherent with medications.  Current diabetes medications include: Toujeo 18 units qhs, metformin 500mg  TID, insulin lispro PRN (not taking)  Current hypertension medications include: amlodipine, losartan  Current hyperlipidemia medications include: atorvastatin (not currently taking) -last LDL was 67 on 01/18/19-due  Patient denies hypoglycemic events.  Patient reported dietary habits: Eats 3 meals/day -Discussed DM healthy options for patient to eat -Avoid sugary drinks, desserts and high carbohydrate snacks (potato chips, crackers, etc)  Patient-reported exercise habits: unable to participate  Patient reports nocturia (nighttime urination).  Patient reports neuropathy (nerve pain).  Patient reports visual changes.  Patient denies self foot exams.  O:  Lab Results  Component Value Date   HGBA1C 8.0 (H) 05/08/2020   Lipid Panel     Component Value Date/Time   CHOL 137 09/13/2019 0808   CHOL 116 05/19/2013 1045   TRIG 138 09/13/2019 0808   TRIG 149 10/13/2013 1252   TRIG 94 05/19/2013 1045   HDL 43 09/13/2019 0808   HDL 49 10/13/2013 1252   HDL 41 05/19/2013 1045   CHOLHDL 3.2 09/13/2019 0808   CHOLHDL 4.5 02/27/2012 0455   VLDL 33 02/27/2012 0455   LDLCALC 70 09/13/2019 0808   LDLCALC 67 10/13/2013 1252   LDLCALC 56 05/19/2013 1045   LDLDIRECT 89 12/02/2014 1250    Home fasting blood sugars: 130-170 (trouble affording medications)  2 hour post-meal/random blood sugars: n/a.   A/P:  Diabetes T2DM  currently uncontrolled. Patient is able to verbalize appropriate hypoglycemia management plan. Patient is mostly adherent with medication. Control is suboptimal due to diet/unable to participate in physical activity due to back/chronic pain.   -Increased dose of basal insulin TOUJEO (insulin GLARGINE) TO 20 UNITS NIGHTLY.  Patient has not increased yet.  TOUJEO SAMPLES given #2 pens  -LOT#[id number]  -EXP 12/29/21  -Recommend patient use HUMALOG (insulin lispro) 4 units at meal times--> for BGs >250.  He is not currently using despite post prandials likely >200   HUMALOG sample #1 pen  [id number]  EXP 7/22  -Continue metformin  -Paperwork filed for [employer] for [employer] (faxed and confirmed).  Medication will ship to PCP office as patient has PO Box.  -Extensively discussed pathophysiology of diabetes, recommended lifestyle interventions, dietary effects on blood sugar control  -Counseled on s/sx of and management of hypoglycemia  -Next A1C anticipated 10/2020     Written patient instructions provided.  Total time in face to face counseling 30 minutes.   Follow up PCP Clinic Visit ON 06/28/20.   Regina Eck, PharmD, BCPS Clinical Pharmacist, [place]  II Phone [phone]

## 2020-06-12 ENCOUNTER — Telehealth: Payer: Self-pay | Admitting: Family

## 2020-06-12 NOTE — Telephone Encounter (Signed)
Med[facility].  Attempted to contact patient to see which meds he is needing at this time

## 2020-06-13 ENCOUNTER — Other Ambulatory Visit: Payer: Self-pay | Admitting: *Deleted

## 2020-06-13 DIAGNOSIS — R351 Nocturia: Secondary | ICD-10-CM

## 2020-06-13 DIAGNOSIS — F1320 Sedative, hypnotic or anxiolytic dependence, uncomplicated: Secondary | ICD-10-CM

## 2020-06-13 DIAGNOSIS — F419 Anxiety disorder, unspecified: Secondary | ICD-10-CM

## 2020-06-13 DIAGNOSIS — M5440 Lumbago with sciatica, unspecified side: Secondary | ICD-10-CM

## 2020-06-13 DIAGNOSIS — Z79899 Other long term (current) drug therapy: Secondary | ICD-10-CM

## 2020-06-13 MED ORDER — FINASTERIDE 5 MG PO TABS: 5.0000 mg | ORAL_TABLET | Freq: Every day | ORAL | 0 refills | Status: DC

## 2020-06-13 MED ORDER — DICLOFENAC SODIUM 1 % EX GEL: 2.0000 g | Freq: Four times a day (QID) | CUTANEOUS | 2 refills | Status: DC

## 2020-06-21 ENCOUNTER — Telehealth: Payer: Self-pay | Admitting: Pharmacist

## 2020-06-21 NOTE — Telephone Encounter (Signed)
[place] PAP program Basaglar shipment arrived at PCP office Call placed to patient to have him pick up at his earliest convenience He will transition from Toujeo 20 units to Basaglar 20 units nightly.  Both are insulin glargine, however selection was based on patient assistance program Patient verbalizes understanding

## 2020-06-28 ENCOUNTER — Ambulatory Visit: Payer: Medicare Other | Admitting: Family

## 2020-07-12 ENCOUNTER — Telehealth: Payer: Self-pay | Admitting: Family

## 2020-07-12 NOTE — Telephone Encounter (Signed)
Pt called to let Almyra Free know that he has had some deaths in his family and has had to help with the costs of the funerals and because of that he doesn't have enough money to get his Rx's. Wants to know if Almyra Free has any samples she could give to him. Wants Almyra Free to call him back tomorrow when she is able.

## 2020-07-13 NOTE — Telephone Encounter (Signed)
I have assisted with all brand name copays through patient assistance (insulins only).  Unfortunately, there is no assistance for generics.  Are there any additional resources to assist him with medications or maybe food? Something to offset cost  Including the CCM team to see if additional resources exist

## 2020-07-14 NOTE — Telephone Encounter (Signed)
Patient is scheduled for a visit with me on Monday. I can go over resource needs with him then.  [name], BSN, RN-BC Embedded Chronic Care Manager [facility] / [place] Management Direct Dial: [phone]

## 2020-07-17 ENCOUNTER — Ambulatory Visit (INDEPENDENT_AMBULATORY_CARE_PROVIDER_SITE_OTHER): Payer: Medicare Other | Admitting: *Deleted

## 2020-07-17 DIAGNOSIS — I10 Essential (primary) hypertension: Secondary | ICD-10-CM

## 2020-07-17 DIAGNOSIS — Z794 Long term (current) use of insulin: Secondary | ICD-10-CM

## 2020-07-17 DIAGNOSIS — E1159 Type 2 diabetes mellitus with other circulatory complications: Secondary | ICD-10-CM | POA: Diagnosis not present

## 2020-07-17 DIAGNOSIS — E119 Type 2 diabetes mellitus without complications: Secondary | ICD-10-CM

## 2020-07-17 DIAGNOSIS — I152 Hypertension secondary to endocrine disorders: Secondary | ICD-10-CM

## 2020-07-17 DIAGNOSIS — Z599 Problem related to housing and economic circumstances, unspecified: Secondary | ICD-10-CM

## 2020-07-17 NOTE — Patient Instructions (Signed)
Visit Information  Goals Addressed              This Visit's Progress     Patient Stated   .  "I need help paying for my medication" (pt-stated)        [place] (see longitudinal plan of care for additional care plan information)  Current Barriers:  . Chronic Disease Management support and education needs related to diabetes, hypertension, hyperlipidemia . Film/video editor.  . Difficulty obtaining medications  Nurse Case Manager Clinical Goal(s):  [name] Over the next 10 days, patient will work with embedded Care Guides regarding food or medication assistance . Over the next 30 days, patient will talk with RN Care Manager about resource needs  Interventions:  . Inter-disciplinary care team collaboration (see longitudinal plan of care) . Care Guide referral for assistance with food and medication resources . Collaborated with [facility] Clinical Pharmacist . Discussed financial difficulties . Reviewed and discussed medications o Receiving prescription assistance for insulin o Having difficulty with generic copays . Provided with RN Care Manager contact number and encouraged to reach out as needed  Patient Self Care Activities:  . Performs ADL's independently . Performs IADL's independently  Initial goal documentation     .  "I want to get control of my blood sugar" (pt-stated)        Current Barriers:  . Financial Constraints.  . Knowledge Deficits related to diabetes self management   . Anxiety  Nurse Case Manager Clinical Goal(s):  [name] Over the next 90 days, patient will take medications as prescribed . Over the next 90 days, patient will keep all scheduled medical appointments  Interventions:  . Chart reviewed including recent office notes and lab results . Reviewed and discussed medications o Receiving prescription assistance for insulin . Discussed diet . Dicussed home blood sugar testing . Discussed financial difficulties . Referral to Connected Care to  see if there are any resources that can help with generic copays or food costs . Reviewed upcoming appointments . Encouraged patient to keep all medical appointments . Provided with RNCM contact number and encouraged to reach out as needed  Patient Self Care Activities:  . Performs ADL's independently . Performs IADL's independently  Please see past updates related to this goal by clicking on the "Past Updates" button in the selected goal        Other   .  COMPLETED: Handicap Placard        CARE PLAN ENTRY (see longtitudinal plan of care for additional care plan information)  Current Barriers:  . Lacks caregiver support.  . Film/video editor.  . Cognitive Deficits  Nurse Case Manager Clinical Goal(s):  [name] Over the next 7 days, patient will pick up handicap placard renewal form from [facility]  Interventions:  . Talked with patient by telephone . Chart reviewed . Collaborated with Clinical and front office staff at [facility] regarding form . Advised patient that form is available for pickup at the front desk . Advised to bring receipt where he paid for the form to be filled out  Patient Self Care Activities:  . Performs ADL's independently . Performs IADL's independently  Initial goal documentation        Patient verbalizes understanding of instructions provided today.   Follow-up Plan The care management team will reach out to the patient again over the next 30 days.   [name], BSN, RN-BC Embedded Chronic Care Manager [facility] / [place] Management Direct Dial: [phone]

## 2020-07-17 NOTE — Chronic Care Management (AMB) (Addendum)
Chronic Care Management   Follow Up Note   07/17/2020 Name: [name] MRN: [id number] DOB: 17-Oct-1933  Referred by: [name], FNP Reason for referral : Chronic Care Management (RN follow up)   [name] is a 86 y.o. year old male who is a primary care patient of [name], FNP. The CCM team was consulted for assistance with chronic disease management and care coordination needs.    Review of patient status, including review of consultants reports, relevant laboratory and other test results, and collaboration with appropriate care team members and the patient's provider was performed as part of comprehensive patient evaluation and provision of chronic care management services.    I spoke with [name] by telephone today regarding management of his chronic medical conditions.   SDOH (Social Determinants of Health) assessments performed: Yes-financials See Care Plan activities for detailed interventions related to [facility])      Outpatient Encounter Medications as of 07/17/2020  Medication Sig   ACCU-CHEK GUIDE test strip TEST BS TWICE A DAY DX E11.9   amLODipine (NORVASC) 10 MG tablet Take 1 tablet (10 mg total) by mouth daily.   aspirin 81 MG tablet Take 81 mg by mouth daily. Takes EC aspirin   atorvastatin (LIPITOR) 20 MG tablet Take 1 tablet (20 mg total) by mouth daily.   baclofen (LIORESAL) 10 MG tablet TAKE 0.5 TABLET (5 MG) BY MOUTH 3 TIMES DAILY   blood glucose meter kit and supplies Dispense per insurance preference. Use up to four times daily as directed. E 11.9   Blood Glucose Monitoring Suppl (BLOOD GLUCOSE MONITOR SYSTEM) w/Device KIT TEST BS BID Dx E11.9   diazepam (VALIUM) 5 MG tablet TAKE 1 TABLET 2 TIMES DAILY AS NEEDED   diclofenac Sodium (VOLTAREN) 1 % GEL Apply 2 g topically 4 (four) times daily.   DULoxetine (CYMBALTA) 30 MG capsule Take 1 capsule (30 mg total) by mouth daily.   finasteride (PROSCAR) 5 MG tablet Take 1 tablet (5 mg total) by  mouth at bedtime. For prostate   furosemide (LASIX) 20 MG tablet TAKE 1 OR 2 TABLETS (20-40 MG TOTAL) BY MOUTH DAILY.   gabapentin (NEURONTIN) 100 MG capsule Take 1 capsule (100 mg total) by mouth 2 (two) times daily.   glucose blood (ONE TOUCH ULTRA TEST) test strip USE FOR TESTING up to 5 x a day   insulin glargine, 2 Unit Dial, (TOUJEO MAX SOLOSTAR) 300 UNIT/ML Solostar Pen Inject 10 Units into the skin at bedtime.   insulin lispro (HUMALOG) 100 UNIT/ML KwikPen Inject 4 units sq as directed for blood sugar readings greater than 250. Call PCP if using more than 3 times a week. (Patient not taking: Reported on 05/18/2020)   Insulin Pen Needle (ULTICARE MICRO PEN NEEDLES) 32G X 4 MM MISC Check blood sugar 4 x daily and as needed for Dx: E11.9   losartan (COZAAR) 100 MG tablet TAKE 1 TABLET BY MOUTH EVERY DAY   meloxicam (MOBIC) 7.5 MG tablet Take 1 tablet (7.5 mg total) by mouth daily.   metFORMIN (GLUCOPHAGE-XR) 500 MG 24 hr tablet TAKE 1 TABLET BY MOUTH 3 TIMES DAILY WITH MEALS.   OneTouch Delica Lancets 39Q MISC USE 2 TIMES A DAY Dx E11.9   oxybutynin (DITROPAN XL) 10 MG 24 hr tablet Take 1 tablet (10 mg total) by mouth at bedtime.   tamsulosin (FLOMAX) 0.4 MG CAPS capsule Take 2 capsules (0.8 mg total) by mouth at bedtime. For prostate.   Vitamin D, Ergocalciferol, (  DRISDOL) 1.25 MG (50000 UNIT) CAPS capsule TAKE 1 CAPSULE BY MOUTH ON SUNDAY OF EACH WEEK AS DIRECTED   No facility-administered encounter medications on file as of 07/17/2020.     Lab Results  Component Value Date   HGBA1C 8.0 (H) 05/08/2020   HGBA1C 8.6 (H) 01/31/2020   HGBA1C 8.2 (H) 10/19/2019   Lab Results  Component Value Date   MICROALBUR 20 07/04/2014   [place] 70 09/13/2019   CREATININE 0.97 05/08/2020     RN Care Plan      "I need help paying for my medication" (pt-stated)        CARE PLAN ENTRY (see longitudinal plan of care for additional care plan information)  Current Barriers:  Chronic Disease  Management support and education needs related to diabetes, hypertension, hyperlipidemia Film/video editor.  Difficulty obtaining medications  Nurse Case Manager Clinical Goal(s):  Over the next 10 days, patient will work with embedded Care Guides regarding food or medication assistance Over the next 30 days, patient will talk with [place] about resource needs  Interventions:  Inter-disciplinary care team collaboration (see longitudinal plan of care) Care Guide referral for assistance with food and medication resources Collaborated with [facility] Clinical Pharmacist Discussed financial difficulties Reviewed and discussed medications Receiving prescription assistance for insulin Having difficulty with generic copays Provided with RN Care Manager contact number and encouraged to reach out as needed  Patient Self Care Activities:  Performs ADL's independently Performs IADL's independently  Initial goal documentation       "I want to get control of my blood sugar" (pt-stated)        Current Barriers:  Film/video editor.  Knowledge Deficits related to diabetes self management   Anxiety  Nurse Case Manager Clinical Goal(s):  Over the next 90 days, patient will take medications as prescribed Over the next 90 days, patient will keep all scheduled medical appointments  Interventions:  Chart reviewed including recent office notes and lab results Reviewed and discussed medications Receiving prescription assistance for insulin Discussed diet Dicussed home blood sugar testing Discussed financial difficulties Referral to Connected Care to see if there are any resources that can help with generic copays or food costs Reviewed upcoming appointments Encouraged patient to keep all medical appointments Provided with RNCM contact number and encouraged to reach out as needed  Patient Self Care Activities:  Performs ADL's independently Performs IADL's independently  Please see  past updates related to this goal by clicking on the "Past Updates" button in the selected goal        Plan:   The care management team will reach out to the patient again over the next 30 days.    [name], BSN, RN-BC Embedded Chronic Care Manager [facility] / [place] Management Direct Dial: [phone]    I have reviewed the CCM documentation and agree with the written assessment and plan of care.  [name], FNP

## 2020-08-03 ENCOUNTER — Telehealth: Payer: Medicare Other

## 2020-08-08 ENCOUNTER — Other Ambulatory Visit: Payer: Self-pay | Admitting: Family

## 2020-08-08 ENCOUNTER — Telehealth: Payer: Self-pay | Admitting: *Deleted

## 2020-08-08 ENCOUNTER — Telehealth: Payer: Medicare Other | Admitting: *Deleted

## 2020-08-08 DIAGNOSIS — N401 Enlarged prostate with lower urinary tract symptoms: Secondary | ICD-10-CM

## 2020-08-08 NOTE — Telephone Encounter (Signed)
  Chronic Care Management   Follow-up Note  08/08/2020 Name: [name] MRN: [id number] DOB: [birth year]  Referred by: [name], FNP Reason for referral : Chronic Care Management (RN follow up)   An unsuccessful telephone follow-up was attempted today. The patient was referred to the case management team for assistance with care management and care coordination.   Follow Up Plan: The care management team will reach out to the patient again over the next 30 days.  A referral was previously placed to embedded CCM Care Guides to help with food resources or medication costs.   [name], BSN, RN-BC Embedded Chronic Care Manager [facility] / [place] Management Direct Dial: [phone]

## 2020-08-09 ENCOUNTER — Telehealth: Payer: Medicare Other

## 2020-08-10 ENCOUNTER — Ambulatory Visit: Payer: Medicare Other

## 2020-08-10 ENCOUNTER — Other Ambulatory Visit: Payer: Self-pay

## 2020-08-11 ENCOUNTER — Other Ambulatory Visit: Payer: Self-pay | Admitting: Family

## 2020-08-11 ENCOUNTER — Telehealth: Payer: Self-pay | Admitting: Family

## 2020-08-11 DIAGNOSIS — G8929 Other chronic pain: Secondary | ICD-10-CM

## 2020-08-11 NOTE — Telephone Encounter (Signed)
   SF 08/11/2020   Name: [name]   MRN: [id number]   DOB: [birth year]   AGE: 86 y.o.   GENDER: male   PCP [name], FNP.   Spoke with patient on 08/10/20 regarding referral for financial and medication assistance. Patient stated that currently he does not have enough money to afford his medications. He states that he receives social security, but he feels its not enough to help him with his medications. He stated that currently he has been asking for help from his family and friends, but now he feels that its starting to weigh a toll on everyone. Asked patient if he has tried to apply for Medicare Extra Help. Patient stated that he has never heard of the service before, but would be willing to apply. Care Guide will connect patient with [facility] for [employer] [phone]) [place] to assist with Medicare Extra Help. Care Guide will also research to see if there are any resources for financial assistance.     [place], Care Management Phone: [phone] Email: [email or web address]

## 2020-08-11 NOTE — Telephone Encounter (Signed)
Spoke with June at [facility] for [place]), she is the [place] that assist with Medicare Extra Help application. She stated that Mr. Anglin should give her a call to see if he is eligible. Care Guide called Mr. Viviano on three-way so that he could speak with June to complete his application process. June stated that because of Mr. Steffy income he is not eligible for Medicare Extra Help. Patient receives close to $1000 a month for [employer]. Care Guide will research to see if there are any other resources that may be able to assist the patient.

## 2020-08-11 NOTE — Telephone Encounter (Signed)
Spoke with Mariann Laster from [employer]. She stated that there organization does not provide financial assistance. She stated that the patient should call the [facility] Department and speak with the pharmacy department [phone]). The pharmacy has a program that offers assistance with prescriptions. Care Guide will give patient information.

## 2020-08-21 NOTE — Telephone Encounter (Signed)
   SF 08/21/2020   Name: [name]   MRN: [id number]   DOB: 01-15-1933   AGE: 86 y.o.   GENDER: male   PCP [name], FNP.   Called pt regarding Data processing manager Referral for financial assistance for medication cost. Currently resources within [facility] are not providing financial assistance Advertising account planner, Social research officer, government). Patient is not eligible for Medicare Extra Help due to his income.  Asked [name] if he has any additional needs at this time. He stated that currently he does not. He stated that he is still looking or monetary assistance, but he has received money from friends and family members to assist him with purchasing his medications. Informed patient that he can give office a call if he has any additional needs. Patient stated understanding.   Closing referral pending any other needs of patient.    [place], Care Management Phone: [phone] Email: [email or web address]

## 2020-08-24 ENCOUNTER — Ambulatory Visit (INDEPENDENT_AMBULATORY_CARE_PROVIDER_SITE_OTHER): Payer: Medicare Other | Admitting: *Deleted

## 2020-08-24 DIAGNOSIS — I152 Hypertension secondary to endocrine disorders: Secondary | ICD-10-CM

## 2020-08-24 DIAGNOSIS — Z599 Problem related to housing and economic circumstances, unspecified: Secondary | ICD-10-CM

## 2020-08-24 DIAGNOSIS — E1159 Type 2 diabetes mellitus with other circulatory complications: Secondary | ICD-10-CM | POA: Diagnosis not present

## 2020-08-24 DIAGNOSIS — Z794 Long term (current) use of insulin: Secondary | ICD-10-CM

## 2020-08-24 DIAGNOSIS — I10 Essential (primary) hypertension: Secondary | ICD-10-CM

## 2020-08-24 DIAGNOSIS — E119 Type 2 diabetes mellitus without complications: Secondary | ICD-10-CM | POA: Diagnosis not present

## 2020-08-25 ENCOUNTER — Telehealth: Payer: Self-pay | Admitting: Pharmacist

## 2020-08-25 NOTE — Chronic Care Management (AMB) (Addendum)
Chronic Care Management   Follow Up Note   08/24/2020 Name: Armon Orvis MRN: [id number] DOB: [birth year]  Referred by: [name], FNP Reason for referral : Chronic Care Management (RN follow-up on medications)   Castle Lamons is a 86 y.o. year old male who is a primary care patient of [name], FNP. The CCM team was consulted for assistance with chronic disease management and care coordination needs.    Review of patient status, including review of consultants reports, relevant laboratory and other test results, and collaboration with appropriate care team members and the patient's provider was performed as part of comprehensive patient evaluation and provision of chronic care management services.    SDOH (Social Determinants of Health) assessments performed: No See Care Plan activities for detailed interventions related to [facility])      Outpatient Encounter Medications as of 08/24/2020  Medication Sig   ACCU-CHEK GUIDE test strip TEST BS TWICE A DAY DX E11.9   amLODipine (NORVASC) 10 MG tablet Take 1 tablet (10 mg total) by mouth daily.   aspirin 81 MG tablet Take 81 mg by mouth daily. Takes EC aspirin   baclofen (LIORESAL) 10 MG tablet TAKE 0.5 TABLET (5 MG) BY MOUTH 3 TIMES DAILY   blood glucose meter kit and supplies Dispense per insurance preference. Use up to four times daily as directed. E 11.9   Blood Glucose Monitoring Suppl (BLOOD GLUCOSE MONITOR SYSTEM) w/Device KIT TEST BS BID Dx E11.9   diazepam (VALIUM) 5 MG tablet TAKE 1 TABLET 2 TIMES DAILY AS NEEDED   diclofenac Sodium (VOLTAREN) 1 % GEL Apply 2 g topically 4 (four) times daily.   DULoxetine (CYMBALTA) 30 MG capsule Take 1 capsule (30 mg total) by mouth daily. (Patient not taking: Reported on 08/10/2020)   erythromycin ophthalmic ointment APPLY A SMALL AMOUNT ON EYELID TWICE A DAY   finasteride (PROSCAR) 5 MG tablet TAKE 1 TABLET (5 MG TOTAL) BY MOUTH AT BEDTIME. FOR PROSTATE   furosemide (LASIX) 20 MG  tablet TAKE 1 OR 2 TABLETS (20-40 MG TOTAL) BY MOUTH DAILY.   gabapentin (NEURONTIN) 100 MG capsule Take 1 capsule (100 mg total) by mouth 2 (two) times daily. (Patient not taking: Reported on 08/10/2020)   glucose blood (ONE TOUCH ULTRA TEST) test strip USE FOR TESTING up to 5 x a day   insulin glargine, 2 Unit Dial, (TOUJEO MAX SOLOSTAR) 300 UNIT/ML Solostar Pen Inject 10 Units into the skin at bedtime.   insulin lispro (HUMALOG) 100 UNIT/ML KwikPen Inject 4 units sq as directed for blood sugar readings greater than 250. Call PCP if using more than 3 times a week.   Insulin Pen Needle (ULTICARE MICRO PEN NEEDLES) 32G X 4 MM MISC Check blood sugar 4 x daily and as needed for Dx: E11.9   latanoprost (XALATAN) 0.005 % ophthalmic solution 1 drop daily.   losartan (COZAAR) 100 MG tablet TAKE 1 TABLET BY MOUTH EVERY DAY   meloxicam (MOBIC) 7.5 MG tablet Take 1 tablet (7.5 mg total) by mouth daily. Needs to be seen before next refill   metFORMIN (GLUCOPHAGE-XR) 500 MG 24 hr tablet TAKE 1 TABLET BY MOUTH 3 TIMES DAILY WITH MEALS.   OneTouch Delica Lancets 01S MISC USE 2 TIMES A DAY Dx E11.9   tamsulosin (FLOMAX) 0.4 MG CAPS capsule TAKE 2 CAPSULES (0.8 MG TOTAL) BY MOUTH AT BEDTIME. FOR PROSTATE.   Vitamin D, Ergocalciferol, (DRISDOL) 1.25 MG (50000 UNIT) CAPS capsule TAKE 1 CAPSULE BY MOUTH ON SUNDAY  OF EACH WEEK AS DIRECTED   No facility-administered encounter medications on file as of 08/24/2020.    Lab Results  Component Value Date   HGBA1C 8.0 (H) 05/08/2020   HGBA1C 8.6 (H) 01/31/2020   HGBA1C 8.2 (H) 10/19/2019   Lab Results  Component Value Date   MICROALBUR 20 07/04/2014   [place] 70 09/13/2019   CREATININE 0.97 05/08/2020     RN Care Plan      "I want to get control of my blood sugar" (pt-stated)        Current Barriers:  Film/video editor.  Knowledge Deficits related to diabetes self management   Anxiety  Nurse Case Manager Clinical Goal(s):  Over the next 90 days,  patient will take medications as prescribed Over the next 90 days, patient will keep all scheduled medical appointments  Interventions:  Chart reviewed including recent office notes and lab results Reviewed and discussed medications Running low on Humalog Received prescription assistance for Basaglar and has a 4 month supply Collaborated with Lottie Dawson, PharmD Sample of humalog placed up front and patient notified Discussed home blood sugar testing and results Encouraged patient to test 3 to 4 times a day and to call PCP office with any readings outside of recommended range Encouraged to keep follow-up appointments Encouraged to reach out to [facility] as needed Patient Self Care Activities:  Performs ADL's independently Performs IADL's independently  Please see past updates related to this goal by clicking on the "Past Updates" button in the selected goal           Plan:   The care management team will reach out to the patient again over the next 60 days.    [name], BSN, RN-BC Embedded Chronic Care Manager [facility] / [place] Management Direct Dial: [phone]    I have reviewed the CCM documentation and agree with the written assessment and plan of care.  [name], FNP

## 2020-08-25 NOTE — Telephone Encounter (Signed)
DJM#[id number] DA EXP 8/23 HUMALOG SAMPLE LEFT IN FRIDGE Need to fill out humalog paperwork-placed on PCP desk

## 2020-08-25 NOTE — Patient Instructions (Signed)
Visit Information  Goals Addressed              This Visit's Progress     Patient Stated   .  COMPLETED: "I need help paying for my medication" (pt-stated)        CARE PLAN ENTRY (see longitudinal plan of care for additional care plan information)  Current Barriers:  . Chronic Disease Management support and education needs related to diabetes, hypertension, hyperlipidemia . Film/video editor.  . Difficulty obtaining medications  Nurse Case Manager Clinical Goal(s):  [name] Over the next 10 days, patient will work with embedded Care Guides regarding food or medication assistance . Over the next 30 days, patient will talk with RN Care Manager about resource needs  Interventions:  . Inter-disciplinary care team collaboration (see longitudinal plan of care) . Care Guide referral for assistance with food and medication resources . Collaborated with [facility] Clinical Pharmacist . Discussed financial difficulties . Reviewed and discussed medications o Receiving prescription assistance for insulin o Having difficulty with generic copays . Provided with RN Care Manager contact number and encouraged to reach out as needed  Patient Self Care Activities:  . Performs ADL's independently . Performs IADL's independently  Initial goal documentation     .  "I want to get control of my blood sugar" (pt-stated)        Current Barriers:  . Financial Constraints.  . Knowledge Deficits related to diabetes self management   . Anxiety  Nurse Case Manager Clinical Goal(s):  [name] Over the next 90 days, patient will take medications as prescribed . Over the next 90 days, patient will keep all scheduled medical appointments  Interventions:  . Chart reviewed including recent office notes and lab results . Reviewed and discussed medications o Running low on Humalog o Received prescription assistance for Basaglar and has a 4 month supply . Collaborated with Lottie Dawson, PharmD o Sample of humalog  placed up front and patient notified . Discussed home blood sugar testing and results . Encouraged patient to test 3 to 4 times a day and to call PCP office with any readings outside of recommended range . Encouraged to keep follow-up appointments . Encouraged to reach out to [facility] as needed Patient Self Care Activities:  . Performs ADL's independently . Performs IADL's independently  Please see past updates related to this goal by clicking on the "Past Updates" button in the selected goal         Patient verbalizes understanding of instructions provided today.   Follow-up Plan The care management team will reach out to the patient again over the next 60 days.   [name], BSN, RN-BC Embedded Chronic Care Manager [facility] / [place] Management Direct Dial: [phone]

## 2020-09-04 ENCOUNTER — Other Ambulatory Visit: Payer: Self-pay | Admitting: Family

## 2020-09-04 DIAGNOSIS — Z794 Long term (current) use of insulin: Secondary | ICD-10-CM

## 2020-09-16 ENCOUNTER — Other Ambulatory Visit: Payer: Self-pay | Admitting: Family

## 2020-09-16 DIAGNOSIS — R609 Edema, unspecified: Secondary | ICD-10-CM

## 2020-09-16 DIAGNOSIS — E1159 Type 2 diabetes mellitus with other circulatory complications: Secondary | ICD-10-CM

## 2020-09-20 ENCOUNTER — Telehealth: Payer: Self-pay | Admitting: Pharmacist

## 2020-09-20 ENCOUNTER — Other Ambulatory Visit: Payer: Self-pay | Admitting: Family

## 2020-09-20 DIAGNOSIS — R351 Nocturia: Secondary | ICD-10-CM

## 2020-09-20 NOTE — Telephone Encounter (Signed)
Call placed to patient (no answer and no VM available)  Humalog for patient is labeled and placed in fridge.  This is his free medication straight from the drug company via patient assistance program  Please call patient to remind him to pick up

## 2020-09-25 NOTE — Telephone Encounter (Signed)
Call placed to patient x2 (no answer and no VM available--full)  Humalog for patient is labeled and placed in fridge.  This is his free medication straight from the drug company via patient assistance program  Please call patient to remind him to pick up

## 2020-09-26 NOTE — Telephone Encounter (Signed)
Call placed to patient x3 (no answer and no VM available--full)  Humalog for patient is labeled and placed in fridge. This is his free medication straight from the drug company via patient assistance program  Please call patient to remind him to pick up

## 2020-09-26 NOTE — Telephone Encounter (Signed)
Patient aware and verbalized understanding. °

## 2020-09-26 NOTE — Telephone Encounter (Signed)
Patient aware to pick up 

## 2020-09-26 NOTE — Telephone Encounter (Signed)
LMTCB

## 2020-09-27 NOTE — Chronic Care Management (AMB) (Signed)
  Chronic Care Management   Outreach Note  11/202020 Name: [name] MRN: [id number] DOB: 1933/01/24  Referred by: [name], FNP Reason for referral : Chronic Care Management (CCM follow up)   An unsuccessful telephone outreach was attempted today. The patient was referred to the case management team for assistance with care management and care coordination.   Follow Up Plan: A HIPAA compliant phone message was left for the patient providing contact information and requesting a return call.  The care management team will reach out to the patient again over the next 30 days.   [name], BSN, RN-BC Embedded Chronic Care Manager [facility] / [place] Management Direct Dial: [phone]

## 2020-10-05 ENCOUNTER — Other Ambulatory Visit: Payer: Self-pay | Admitting: Family

## 2020-10-05 DIAGNOSIS — N401 Enlarged prostate with lower urinary tract symptoms: Secondary | ICD-10-CM

## 2020-10-06 ENCOUNTER — Encounter: Payer: Self-pay | Admitting: Family

## 2020-10-06 ENCOUNTER — Ambulatory Visit (INDEPENDENT_AMBULATORY_CARE_PROVIDER_SITE_OTHER): Payer: Medicare Other | Admitting: Family

## 2020-10-06 DIAGNOSIS — G8929 Other chronic pain: Secondary | ICD-10-CM

## 2020-10-06 DIAGNOSIS — M5440 Lumbago with sciatica, unspecified side: Secondary | ICD-10-CM | POA: Diagnosis not present

## 2020-10-06 MED ORDER — METHYLPREDNISOLONE ACETATE 80 MG/ML IJ SUSP
80.0000 mg | Freq: Once | INTRAMUSCULAR | Status: AC
Start: 2020-10-06 — End: 2020-10-06
  Administered 2020-10-06: 80 mg via INTRAMUSCULAR

## 2020-10-06 NOTE — Progress Notes (Signed)
   Virtual Visit via telephone Note Due to COVID-19 pandemic this visit was conducted virtually. This visit type was conducted due to national recommendations for restrictions regarding the COVID-19 Pandemic (e.g. social distancing, sheltering in place) in an effort to limit this patient's exposure and mitigate transmission in our community. All issues noted in this document were discussed and addressed.  A physical exam was not performed with this format.  I connected with [name] on 10/06/20 at 1:33 pm  by telephone and verified that I am speaking with the correct person using two identifiers. [name] is currently located at home and no one is currently with him during visit. The provider, [name], FNP is located in their office at time of visit.  I discussed the limitations, risks, security and privacy concerns of performing an evaluation and management service by telephone and the availability of in person appointments. I also discussed with the patient that there may be a patient responsible charge related to this service. The patient expressed understanding and agreed to proceed.   History and Present Illness:  Back Pain This is a chronic problem. The current episode started more than 1 month ago. The problem occurs intermittently. The problem has been waxing and waning since onset. The pain is present in the lumbar spine. The quality of the pain is described as aching. The pain radiates to the left thigh. The pain is at a severity of 10/10. The pain is moderate. Associated symptoms include numbness and weakness. Pertinent negatives include no bladder incontinence or bowel incontinence. Risk factors include obesity and sedentary lifestyle. He has tried bed rest, home exercises and muscle relaxant for the symptoms. The treatment provided mild relief.      Review of Systems  Gastrointestinal: Negative for bowel incontinence.  Genitourinary: Negative for bladder  incontinence.  Musculoskeletal: Positive for back pain.  Neurological: Positive for weakness and numbness.  All other systems reviewed and are negative.    Observations/Objective: No SOB or distress, pt anxious  Assessment and Plan: 1. Chronic bilateral low back pain with sciatica, sciatica laterality unspecified Rest ROM exercises encouraged Has seen multiple specialists, does not wish to have another referral placed Continue mobic and baclofen as needed Strict low carb diet - methylPREDNISolone acetate (DEPO-MEDROL) injection 80 mg     I discussed the assessment and treatment plan with the patient. The patient was provided an opportunity to ask questions and all were answered. The patient agreed with the plan and demonstrated an understanding of the instructions.   The patient was advised to call back or seek an in-person evaluation if the symptoms worsen or if the condition fails to improve as anticipated.  The above assessment and management plan was discussed with the patient. The patient verbalized understanding of and has agreed to the management plan. Patient is aware to call the clinic if symptoms persist or worsen. Patient is aware when to return to the clinic for a follow-up visit. Patient educated on when it is appropriate to go to the emergency department.   Time call ended:  1:42 pm   I provided 9 minutes of non-face-to-face time during this encounter.    [name], FNP

## 2020-10-13 ENCOUNTER — Other Ambulatory Visit: Payer: Self-pay | Admitting: Family

## 2020-10-13 DIAGNOSIS — R351 Nocturia: Secondary | ICD-10-CM

## 2020-10-13 DIAGNOSIS — Z794 Long term (current) use of insulin: Secondary | ICD-10-CM

## 2020-10-13 DIAGNOSIS — E119 Type 2 diabetes mellitus without complications: Secondary | ICD-10-CM

## 2020-10-13 DIAGNOSIS — N401 Enlarged prostate with lower urinary tract symptoms: Secondary | ICD-10-CM

## 2020-10-25 ENCOUNTER — Telehealth: Payer: Medicare Other | Admitting: *Deleted

## 2020-11-03 ENCOUNTER — Other Ambulatory Visit: Payer: Self-pay

## 2020-11-03 ENCOUNTER — Ambulatory Visit (INDEPENDENT_AMBULATORY_CARE_PROVIDER_SITE_OTHER): Payer: Medicare Other | Admitting: Family

## 2020-11-03 ENCOUNTER — Encounter: Payer: Self-pay | Admitting: Family

## 2020-11-03 VITALS — BP 157/61 | HR 97 | Temp 98.5°F | Ht 70.0 in | Wt 231.8 lb

## 2020-11-03 DIAGNOSIS — E785 Hyperlipidemia, unspecified: Secondary | ICD-10-CM

## 2020-11-03 DIAGNOSIS — E119 Type 2 diabetes mellitus without complications: Secondary | ICD-10-CM | POA: Diagnosis not present

## 2020-11-03 DIAGNOSIS — Z794 Long term (current) use of insulin: Secondary | ICD-10-CM

## 2020-11-03 DIAGNOSIS — F411 Generalized anxiety disorder: Secondary | ICD-10-CM | POA: Diagnosis not present

## 2020-11-03 DIAGNOSIS — M5442 Lumbago with sciatica, left side: Secondary | ICD-10-CM | POA: Diagnosis not present

## 2020-11-03 DIAGNOSIS — E1169 Type 2 diabetes mellitus with other specified complication: Secondary | ICD-10-CM

## 2020-11-03 LAB — BAYER DCA HB A1C WAIVED: HB A1C (BAYER DCA - WAIVED): 7.8 % — ABNORMAL HIGH (ref <7.0)

## 2020-11-03 MED ORDER — METHYLPREDNISOLONE ACETATE 40 MG/ML IJ SUSP
40.0000 mg | Freq: Once | INTRAMUSCULAR | Status: AC
  Administered 2020-11-03: 40 mg via INTRAMUSCULAR

## 2020-11-03 MED ORDER — KETOROLAC TROMETHAMINE 60 MG/2ML IM SOLN
30.0000 mg | Freq: Once | INTRAMUSCULAR | Status: AC
  Administered 2020-11-03: 30 mg via INTRAMUSCULAR

## 2020-11-03 MED ORDER — ATORVASTATIN CALCIUM 20 MG PO TABS: 20.0000 mg | ORAL_TABLET | Freq: Every day | ORAL | 3 refills | Status: DC

## 2020-11-03 MED ORDER — DULOXETINE HCL 60 MG PO CPEP: 60.0000 mg | ORAL_CAPSULE | Freq: Every day | ORAL | 1 refills | Status: DC
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
## 2020-11-03 NOTE — Patient Instructions (Signed)

## 2020-11-04 LAB — BMP8+EGFR
BUN/Creatinine Ratio: 9 — ABNORMAL LOW (ref 10–24)
BUN: 7 mg/dL — ABNORMAL LOW (ref 8–27)
CO2: 21 mmol/L (ref 20–29)
Calcium: 10.2 mg/dL (ref 8.6–10.2)
Chloride: 98 mmol/L (ref 96–106)
Creatinine, Ser: 0.78 mg/dL (ref 0.76–1.27)
GFR calc Af Amer: 95 mL/min/{1.73_m2} (ref >59)
GFR calc non Af Amer: 82 mL/min/{1.73_m2} (ref >59)
Glucose: 205 mg/dL — ABNORMAL HIGH (ref 65–99)
Potassium: 3.9 mmol/L (ref 3.5–5.2)
Sodium: 137 mmol/L (ref 134–144)

## 2020-11-05 ENCOUNTER — Other Ambulatory Visit: Payer: Self-pay | Admitting: Family

## 2020-11-05 DIAGNOSIS — Z79899 Other long term (current) drug therapy: Secondary | ICD-10-CM

## 2020-11-05 DIAGNOSIS — F1320 Sedative, hypnotic or anxiolytic dependence, uncomplicated: Secondary | ICD-10-CM

## 2020-11-05 DIAGNOSIS — F419 Anxiety disorder, unspecified: Secondary | ICD-10-CM

## 2020-11-07 ENCOUNTER — Ambulatory Visit (INDEPENDENT_AMBULATORY_CARE_PROVIDER_SITE_OTHER): Payer: Medicare Other | Admitting: Family

## 2020-11-07 ENCOUNTER — Encounter: Payer: Self-pay | Admitting: Family

## 2020-11-07 ENCOUNTER — Other Ambulatory Visit: Payer: Self-pay

## 2020-11-07 VITALS — BP 141/62 | HR 97 | Temp 99.5°F | Ht 70.0 in | Wt 231.0 lb

## 2020-11-07 DIAGNOSIS — M545 Low back pain, unspecified: Secondary | ICD-10-CM

## 2020-11-07 DIAGNOSIS — Z79891 Long term (current) use of opiate analgesic: Secondary | ICD-10-CM | POA: Diagnosis not present

## 2020-11-07 DIAGNOSIS — R351 Nocturia: Secondary | ICD-10-CM

## 2020-11-07 DIAGNOSIS — N401 Enlarged prostate with lower urinary tract symptoms: Secondary | ICD-10-CM

## 2020-11-07 DIAGNOSIS — E1159 Type 2 diabetes mellitus with other circulatory complications: Secondary | ICD-10-CM | POA: Diagnosis not present

## 2020-11-07 DIAGNOSIS — Z794 Long term (current) use of insulin: Secondary | ICD-10-CM

## 2020-11-07 DIAGNOSIS — K219 Gastro-esophageal reflux disease without esophagitis: Secondary | ICD-10-CM | POA: Diagnosis not present

## 2020-11-07 DIAGNOSIS — G8929 Other chronic pain: Secondary | ICD-10-CM

## 2020-11-07 DIAGNOSIS — F419 Anxiety disorder, unspecified: Secondary | ICD-10-CM

## 2020-11-07 DIAGNOSIS — E119 Type 2 diabetes mellitus without complications: Secondary | ICD-10-CM

## 2020-11-07 DIAGNOSIS — F1320 Sedative, hypnotic or anxiolytic dependence, uncomplicated: Secondary | ICD-10-CM

## 2020-11-07 DIAGNOSIS — E1169 Type 2 diabetes mellitus with other specified complication: Secondary | ICD-10-CM

## 2020-11-07 DIAGNOSIS — I152 Hypertension secondary to endocrine disorders: Secondary | ICD-10-CM

## 2020-11-07 DIAGNOSIS — Z79899 Other long term (current) drug therapy: Secondary | ICD-10-CM

## 2020-11-07 DIAGNOSIS — E785 Hyperlipidemia, unspecified: Secondary | ICD-10-CM

## 2020-11-07 DIAGNOSIS — Z23 Encounter for immunization: Secondary | ICD-10-CM

## 2020-11-07 DIAGNOSIS — E559 Vitamin D deficiency, unspecified: Secondary | ICD-10-CM

## 2020-11-07 MED ORDER — DIAZEPAM 5 MG PO TABS: ORAL_TABLET | ORAL | 4 refills | Status: DC

## 2020-11-07 MED ORDER — MELOXICAM 7.5 MG PO TABS: 7.5000 mg | ORAL_TABLET | Freq: Every day | ORAL | 3 refills | Status: DC

## 2020-11-07 NOTE — Patient Instructions (Signed)
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
## 2020-11-07 NOTE — Progress Notes (Signed)
k

## 2020-11-09 ENCOUNTER — Telehealth: Payer: Self-pay

## 2020-11-09 NOTE — Telephone Encounter (Signed)
Attempted to contact patient - NA °

## 2020-11-10 LAB — TOXASSURE SELECT 13 (MW), URINE

## 2020-11-10 NOTE — Telephone Encounter (Signed)
Patient aware of lab results.

## 2020-11-13 ENCOUNTER — Other Ambulatory Visit: Payer: Self-pay

## 2020-11-13 ENCOUNTER — Ambulatory Visit (INDEPENDENT_AMBULATORY_CARE_PROVIDER_SITE_OTHER): Payer: Medicare Other | Admitting: Pharmacist

## 2020-11-13 ENCOUNTER — Telehealth: Payer: Self-pay | Admitting: Pharmacist

## 2020-11-13 DIAGNOSIS — E119 Type 2 diabetes mellitus without complications: Secondary | ICD-10-CM | POA: Diagnosis not present

## 2020-11-13 NOTE — Telephone Encounter (Signed)
PATIENT REQUESTING CRUTCHES VIA [facility] IN [place], [place] PHONE NUMBER [phone] MAY NEED RX SENT WITH DX CODE  WILL ROUTE TO POOLS

## 2020-11-13 NOTE — Progress Notes (Signed)
    11/13/2020 Name: [name] MRN: [id number] DOB: 12-07-1933   S:   83 yoM Presents for diabetes evaluation, education, and management.  He is ambulating with cane assist and reports his back pain/sciatica is worse.  He received injections on 11/03/20. Patient was referred and last seen by Primary Care Provider on 11/03/20.  Insurance coverage/medication affordability: UHC medicare   Patient denies adherence with medications.  He cannot afford generics and has already tried the health dept.  Current diabetes medications include:Basaglar 15-20 units qhs, metformin 500mg  TID, insulin lispro (humalog) per Sliding scale as needed  Current hypertension medications include:amlodipine, losartan  urrent hyperlipidemia medications include:atorvastatin (not currently taking on time)   Patientdenieshypoglycemic events.  Patient reported dietary habits: Eats37meals/day -Discussed DM healthy options for patient to eat -Avoid sugary drinks, desserts and high carbohydrate snacks (potato chips, crackers, etc)  Discussed meal planning options and Plate method for healthy eating . Avoid sugary drinks and desserts . Incorporate balanced protein, non starchy veggies, 1 serving of carbohydrate with each meal . Increase water intake . Increase physical activity as able   Patient-reported exercise habits:unable to participate due to pain  Patientreportsnocturia (nighttime urination).  Patientreportsneuropathy (nerve pain).  Patientreportsvisual changes.  [place] foot exams.   O:  Lab Results  Component Value Date   HGBA1C 7.8 (H) 11/03/2020      Lipid Panel     Component Value Date/Time   CHOL 137 09/13/2019 0808   CHOL 116 05/19/2013 1045   TRIG 138 09/13/2019 0808   TRIG 149 10/13/2013 1252   TRIG 94 05/19/2013 1045   HDL 43 09/13/2019 0808   HDL 49 10/13/2013 1252   HDL 41 05/19/2013 1045   CHOLHDL 3.2 09/13/2019 0808   CHOLHDL 4.5  02/27/2012 0455   VLDL 33 02/27/2012 0455   LDLCALC 70 09/13/2019 0808   LDLCALC 67 10/13/2013 1252   LDLCALC 56 05/19/2013 1045   LDLDIRECT 89 12/02/2014 1250     Home fasting blood sugars: 130-160 (doesn't always check)  2 hour post-meal/random blood sugars: n/a.    A/P:  Diabetes T2DM, glycemic control improving.  Patient is MOSTLY adherent with medication. Control is suboptimal due to cost  -Increased dose ofbasal insulin TOUJEO(insulin GLARGINE)TO 20 UNITS NIGHTLY. Patient has not increased yet.            -Recommend patient use HUMALOG (insulin lispro) 4 units at meal times--> for BGs >250.   -Paperwork filed for [employer] for [employer] (re-enrolled patient for 2022).  Medication will ship to PCP office as patient has PO Box.  -Extensively discussed pathophysiology of diabetes, recommended lifestyle interventions, dietary effects on blood sugar control  -Counseled on s/sx of and management of hypoglycemia  -Next A1C anticipated 3 months    Written patient instructions provided.  Total time in face to face counseling 30 minutes.   Follow up PCP Clinic Visit in 3 months.    Regina Eck, PharmD, BCPS Clinical Pharmacist, [place]  [facility] Phone [phone]

## 2020-11-14 NOTE — Telephone Encounter (Signed)
Pt states if he can't get crutches he doesn't want anything.

## 2020-11-14 NOTE — Telephone Encounter (Signed)
Crutches is a fall risk for patient, I can send him in a rolling walker?

## 2020-12-20 ENCOUNTER — Telehealth: Payer: Self-pay

## 2020-12-20 NOTE — Telephone Encounter (Signed)
Pt returned missed call from nurse and stated that he would come to the office to pick up his [employer] tomorrow.

## 2020-12-20 NOTE — Telephone Encounter (Signed)
Will mail letter to patients home address asking him to come by and pick up [employer].

## 2020-12-20 NOTE — Telephone Encounter (Signed)
Tried calling patient on 312 number. No answer and no voicemail.  Tried calling daughters number and it is not working.  Basaglar [name] are in the fridge.

## 2020-12-21 ENCOUNTER — Other Ambulatory Visit: Payer: Self-pay | Admitting: Family

## 2020-12-21 DIAGNOSIS — Z794 Long term (current) use of insulin: Secondary | ICD-10-CM

## 2020-12-21 DIAGNOSIS — R351 Nocturia: Secondary | ICD-10-CM

## 2020-12-21 DIAGNOSIS — I152 Hypertension secondary to endocrine disorders: Secondary | ICD-10-CM

## 2021-01-10 ENCOUNTER — Other Ambulatory Visit: Payer: Self-pay | Admitting: Family

## 2021-01-10 DIAGNOSIS — Z794 Long term (current) use of insulin: Secondary | ICD-10-CM

## 2021-01-10 DIAGNOSIS — E119 Type 2 diabetes mellitus without complications: Secondary | ICD-10-CM

## 2021-01-23 ENCOUNTER — Other Ambulatory Visit: Payer: Self-pay | Admitting: Family

## 2021-01-27 ENCOUNTER — Other Ambulatory Visit: Payer: Self-pay | Admitting: Family

## 2021-01-27 DIAGNOSIS — E1159 Type 2 diabetes mellitus with other circulatory complications: Secondary | ICD-10-CM

## 2021-01-27 DIAGNOSIS — G8929 Other chronic pain: Secondary | ICD-10-CM

## 2021-01-27 DIAGNOSIS — R609 Edema, unspecified: Secondary | ICD-10-CM

## 2021-02-05 ENCOUNTER — Other Ambulatory Visit: Payer: Self-pay | Admitting: Family

## 2021-02-05 DIAGNOSIS — G8929 Other chronic pain: Secondary | ICD-10-CM

## 2021-02-13 ENCOUNTER — Other Ambulatory Visit: Payer: Self-pay

## 2021-02-13 ENCOUNTER — Encounter: Payer: Self-pay | Admitting: Family

## 2021-02-13 ENCOUNTER — Telehealth: Payer: Self-pay

## 2021-02-13 ENCOUNTER — Ambulatory Visit (INDEPENDENT_AMBULATORY_CARE_PROVIDER_SITE_OTHER): Payer: Medicare Other | Admitting: Family

## 2021-02-13 VITALS — BP 154/79 | HR 107 | Temp 98.0°F | Ht 70.0 in | Wt 239.6 lb

## 2021-02-13 DIAGNOSIS — R609 Edema, unspecified: Secondary | ICD-10-CM | POA: Diagnosis not present

## 2021-02-13 DIAGNOSIS — I739 Peripheral vascular disease, unspecified: Secondary | ICD-10-CM

## 2021-02-13 NOTE — Telephone Encounter (Signed)
Marcie Bal called from [facility] stating that the compression stockings Rx that [place] sent to them is not covered with pts insurance and that pt requested they send the Rx to [facility].  Marcie Bal said she would fax the Rx to [facility], if we can just verify with them that they received it.

## 2021-02-13 NOTE — Progress Notes (Signed)
Subjective:    Patient ID: [name], male    DOB: Jan 04, 1933, 87 y.o.   MRN: [id number]  Chief Complaint  Patient presents with  . Leg Swelling    Both legs and feet     HPI Pt presents to the office today with bilateral leg and feet swelling that he noticed over the last month.   He has been trying to elevate that has helped.   He is taking Lasix 40 mg daily.   He does not wear compression hose.   He reports he does stand on his feet. He has chronic back pain that radiates down in his left leg. He reports aching pain of 10 out 10. This causes him to have weakness.    Review of Systems  All other systems reviewed and are negative.      Objective:   Physical Exam Vitals reviewed.  Constitutional:      General: He is not in acute distress.    Appearance: He is well-developed and well-nourished.  HENT:     Head: Normocephalic.     Mouth/Throat:     Mouth: Oropharynx is clear and moist.  Eyes:     General:        Right eye: No discharge.        Left eye: No discharge.     Pupils: Pupils are equal, round, and reactive to light.  Neck:     Thyroid: No thyromegaly.  Cardiovascular:     Rate and Rhythm: Normal rate and regular rhythm.     Pulses: Intact distal pulses.     Heart sounds: Normal heart sounds. No murmur heard.   Pulmonary:     Effort: Pulmonary effort is normal. No respiratory distress.     Breath sounds: Normal breath sounds. No wheezing.  Abdominal:     General: Bowel sounds are normal. There is no distension.     Palpations: Abdomen is soft.     Tenderness: There is no abdominal tenderness.  Musculoskeletal:        General: No tenderness.     Cervical back: Normal range of motion and neck supple.     Right lower leg: Edema (trace) present.     Left lower leg: Edema (trace) present.     Comments: Pain in lumbar with flexion and extension   Skin:    General: Skin is warm and dry.     Findings: No erythema or rash.  Neurological:      Mental Status: He is alert and oriented to person, place, and time.     Cranial Nerves: No cranial nerve deficit.     Deep Tendon Reflexes: Reflexes are normal and symmetric.  Psychiatric:        Mood and Affect: Mood and affect normal.        Behavior: Behavior normal.        Thought Content: Thought content normal.        Judgment: Judgment normal.       BP (!) 154/79   Pulse (!) 107   Temp 98 F (36.7 C) (Temporal)   Ht 5' 10"  (1.778 m)   Wt 239 lb 9.6 oz (108.7 kg)   SpO2 96%   BMI 34.38 kg/m      Assessment & Plan:  [name] comes in today with chief complaint of Leg Swelling (Both legs and feet )   Diagnosis and orders addressed:  1. Peripheral edema - Compression stockings - BMP8+EGFR  2. PAD (  peripheral artery disease) ([place]) Start wearing compression hose  Low salt diet  Keep legs elevated Encourage exercising  Continue Lasix  - Compression stockings - BMP8+EGFR    Dale Dun, FNP

## 2021-02-13 NOTE — Patient Instructions (Signed)
Peripheral Vascular Disease  Peripheral vascular disease (PVD) is a disease of the blood vessels. PVD may also be called peripheral artery disease (PAD) or poor circulation. PVD is the blocking or hardening of the arteries anywhere within the circulatory system beyond the heart. This can result in a decreased supply of blood to the arms, legs, and internal organs, such as the stomach or kidneys. However, PVD most often affects a person's lower legs and feet. Without treatment, PVD often worsens. PVD can lead to acute limb ischemia. This occurs when an arm or leg suddenly has trouble getting enough blood. This is a medical emergency. What are the causes? The most common cause of PVD is atherosclerosis. This is a buildup of fatty material and other substances (plaque)inside your arteries. Pieces of plaque can break off from the walls of an artery and become stuck in a smaller artery, blocking blood flow and possibly causing acute limb ischemia. Other common causes of PVD include:  Blood clots that form inside the blood vessels.  Injuries to blood vessels.  Diseases that cause inflammation of blood vessels or cause blood vessel tightening (spasms). What increases the risk? The following factors may make you more likely to develop this condition:  A family history of PVD.  Common medical conditions, including: ? High cholesterol. ? Diabetes. ? High blood pressure (hypertension). ? Heart disease. ? Known atherosclerotic disease in another area of the body. ? Past injury, such as burns or a broken bone.  Other medical conditions, such as: ? Buerger's disease. This is caused by inflamed blood vessels in your hands and feet. ? Some forms of arthritis. ? Birth defects that affect the arteries in your legs. ? Kidney disease.  Using tobacco and nicotine products.  Not getting enough exercise.  Obesity.  Being age 53 or older, or being age 70 or older and having the other risk  factors. What are the signs or symptoms? This condition may cause different symptoms. Your symptoms depend on what body part is not getting enough blood. Common signs and symptoms include:  Cramps in your buttocks, legs, and feet.  Intermittent claudication. This is pain and weakness in your legs during activity that resolves with rest.  Leg pain at rest and leg numbness, tingling, or weakness.  Coldness in a leg or foot, especially when compared to the other leg or foot.  Skin or hair changes. These can include: ? Hair loss. ? Shiny skin. ? Pale or bluish skin. ? Thick toenails.  Inability to get or maintain an erection (erectile dysfunction).  Tiredness (fatigue).  Weak pulse or no pulse in the feet. People with PVD are more likely to develop open wounds (ulcers) and sores on their toes, feet, or legs. The ulcers or sores may take longer than normal to heal. How is this diagnosed? PVD is diagnosed based on your signs and symptoms, a physical exam, and your medical history. You may also have other tests to find the cause. Tests include:  Ankle-brachial index test.This test compares the blood pressure readings of the legs and arms. ? This may also include an exercise ankle-brachial index test in which you walk on a treadmill to check your symptoms.  Doppler ultrasound. This takes pictures of blood flow through your blood vessels.  Imaging studies that use dye to show blood flow. These are: ? CT angiogram. ? Magnetic resonance angiogram, or MRA. How is this treated? Treatment for PVD depends on the cause of your condition, how severe your symptoms  are, and your age. Underlying causes need to be treated and controlled. These include long-term (chronic) conditions, such as diabetes, high cholesterol, and hypertension. Treatment may include:  Lifestyle changes, such as: ? Quitting tobacco use. ? Exercising regularly. ? Following a low-fat, low-cholesterol diet. ? Not drinking  alcohol.  Taking medicines, such as: ? Blood thinners to prevent blood clots. ? Medicines to improve blood flow. ? Medicines to improve cholesterol levels.  Procedures, such as: ? Angioplasty. This uses an inflated balloon to open a blocked artery and improve blood flow. ? Stent implant. This inserts a small mesh tube to keep a blocked artery open. ? Peripheral bypass surgery. This reroutes blood flow around a blocked artery. ? Surgery to remove dead tissue from an infected wound (debridement). ? Amputation. This is surgical removal of the affected limb. It may be necessary in cases of acute limb ischemia when medical or surgical treatments have not helped. Follow these instructions at home: Medicines  Take over-the-counter and prescription medicines only as told by your health care provider.  If you are taking blood thinners: ? Talk with your health care provider before you take any medicines that contain aspirin or NSAIDs, such as ibuprofen. These medicines increase your risk for dangerous bleeding. ? Take your medicine exactly as told, at the same time every day. ? Avoid activities that could cause injury or bruising, and follow instructions about how to prevent falls. ? Wear a medical alert bracelet or carry a card that lists what medicines you take. Lifestyle  Exercise regularly. Ask your health care provider about some good activities for you.  Talk with your health care provider about maintaining a healthy weight. If needed, ask about losing weight.  Eat a diet that is low in fat and cholesterol. If you need help, talk with your health care provider.  Do not drink alcohol.  Do not use any products that contain nicotine or tobacco. These products include cigarettes, chewing tobacco, and vaping devices, such as e-cigarettes. If you need help quitting, ask your health care provider.      General instructions  Take good care of your feet. To do this: ? Wear comfortable shoes  that fit well. ? Check your feet often for any cuts or sores.  Get an annual influenza vaccine.  Keep all follow-up visits. This is important. Where to find more information  Society for Vascular Surgery: vascular.org  American Heart Association: heart.org  National Heart, Lung, and Blood Institute: https://www.hartman-hill.biz/ Contact a health care provider if:  You have leg cramps while walking.  You have leg pain when you rest.  Your leg or foot feels cold.  Your skin changes color.  You have erectile dysfunction.  You have cuts or sores on your legs or feet that do not heal. Get help right away if:  You have sudden changes in color and feeling of your arms or legs, such as: ? Your arm or leg turns cold, numb, and blue. ? Your arm or leg becomes red, warm, swollen, painful, or numb.  You have any symptoms of a stroke. "BE FAST" is an easy way to remember the main warning signs of a stroke: ? B - Balance. Signs are dizziness, sudden trouble walking, or loss of balance. ? E - Eyes. Signs are trouble seeing or a sudden change in vision. ? F - Face. Signs are sudden weakness or numbness of the face, or the face or eyelid drooping on one side. ? A - Arms. Signs  are weakness or numbness in an arm. This happens suddenly and usually on one side of the body. ? S - Speech. Signs are sudden trouble speaking, slurred speech, or trouble understanding what people say. ? T - Time. Time to call emergency services. Write down what time symptoms started.  You have other signs of a stroke, such as: ? A sudden, severe headache with no known cause. ? Nausea or vomiting. ? Seizure.  You have chest pain or trouble breathing. These symptoms may represent a serious problem that is an emergency. Do not wait to see if the symptoms will go away. Get medical help right away. Call your local emergency services (911 in the [place] Do not drive yourself to the hospital. Summary  Peripheral vascular disease (PVD)  is a disease of the blood vessels.  PVD is the blocking or hardening of the arteries anywhere within the circulatory system beyond the heart.  PVD may cause different symptoms. Your symptoms depend on what part of your body is not getting enough blood.  Treatment for PVD depends on what caused it, how severe your symptoms are, and your age. This information is not intended to replace advice given to you by your health care provider. Make sure you discuss any questions you have with your health care provider. Document Revised: 06/19/2020 Document Reviewed: 06/19/2020 Elsevier Patient Education  [place].

## 2021-02-14 LAB — BMP8+EGFR
BUN/Creatinine Ratio: 10 (ref 10–24)
BUN: 10 mg/dL (ref 8–27)
CO2: 20 mmol/L (ref 20–29)
Calcium: 10.3 mg/dL — ABNORMAL HIGH (ref 8.6–10.2)
Chloride: 97 mmol/L (ref 96–106)
Creatinine, Ser: 1.02 mg/dL (ref 0.76–1.27)
GFR calc Af Amer: 77 mL/min/{1.73_m2} (ref >59)
GFR calc non Af Amer: 67 mL/min/{1.73_m2} (ref >59)
Glucose: 144 mg/dL — ABNORMAL HIGH (ref 65–99)
Potassium: 4 mmol/L (ref 3.5–5.2)
Sodium: 139 mmol/L (ref 134–144)

## 2021-02-15 ENCOUNTER — Other Ambulatory Visit: Payer: Self-pay

## 2021-02-15 ENCOUNTER — Encounter: Payer: Self-pay | Admitting: Family Medicine

## 2021-02-15 ENCOUNTER — Telehealth: Payer: Self-pay

## 2021-02-15 ENCOUNTER — Ambulatory Visit (INDEPENDENT_AMBULATORY_CARE_PROVIDER_SITE_OTHER): Payer: Medicare Other | Admitting: Family Medicine

## 2021-02-15 VITALS — BP 160/74 | HR 99 | Temp 99.2°F | Ht 70.0 in | Wt 235.0 lb

## 2021-02-15 DIAGNOSIS — M5440 Lumbago with sciatica, unspecified side: Secondary | ICD-10-CM

## 2021-02-15 DIAGNOSIS — E1142 Type 2 diabetes mellitus with diabetic polyneuropathy: Secondary | ICD-10-CM | POA: Diagnosis not present

## 2021-02-15 DIAGNOSIS — M5441 Lumbago with sciatica, right side: Secondary | ICD-10-CM

## 2021-02-15 DIAGNOSIS — G8929 Other chronic pain: Secondary | ICD-10-CM

## 2021-02-15 DIAGNOSIS — L84 Corns and callosities: Secondary | ICD-10-CM | POA: Diagnosis not present

## 2021-02-15 DIAGNOSIS — M79676 Pain in unspecified toe(s): Secondary | ICD-10-CM | POA: Diagnosis not present

## 2021-02-15 DIAGNOSIS — B351 Tinea unguium: Secondary | ICD-10-CM | POA: Diagnosis not present

## 2021-02-15 MED ORDER — METHYLPREDNISOLONE ACETATE 80 MG/ML IJ SUSP
80.0000 mg | Freq: Once | INTRAMUSCULAR | Status: AC
  Administered 2021-02-15: 80 mg via INTRAMUSCULAR

## 2021-02-15 NOTE — Patient Instructions (Signed)
https://doi.[email or web address]/[email or web address]">  Chronic Back Pain When back pain lasts longer than 3 months, it is called chronic back pain. The cause of your back pain may not be known. Some common causes include:  Wear and tear (degenerative disease) of the bones, ligaments, or disks in your back.  Inflammation and stiffness in your back (arthritis). People who have chronic back pain often go through certain periods in which the pain is more intense (flare-ups). Many people can learn to manage the pain with home care. Follow these instructions at home: Pay attention to any changes in your symptoms. Take these actions to help with your pain: Managing pain and stiffness  If directed, apply ice to the painful area. Your health care provider may recommend applying ice during the first 24-48 hours after a flare-up begins. To do this: ? Put ice in a plastic bag. ? Place a towel between your skin and the bag. ? Leave the ice on for 20 minutes, 2-3 times per day.  If directed, apply heat to the affected area as often as told by your health care provider. Use the heat source that your health care provider recommends, such as a moist heat pack or a heating pad. ? Place a towel between your skin and the heat source. ? Leave the heat on for 20-30 minutes. ? Remove the heat if your skin turns bright red. This is especially important if you are unable to feel pain, heat, or cold. You may have a greater risk of getting burned.  Try soaking in a warm tub.      Activity  Avoid bending and other activities that make the problem worse.  Maintain a proper position when standing or sitting: ? When standing, keep your upper back and neck straight, with your shoulders pulled back. Avoid slouching. ? When sitting, keep your back straight and relax your shoulders. Do not round your shoulders or pull them backward.  Do not sit or stand in one place for long periods of time.  Take brief periods of rest  throughout the day. This will reduce your pain. Resting in a lying or standing position is usually better than sitting to rest.  When you are resting for longer periods, mix in some mild activity or stretching between periods of rest. This will help to prevent stiffness and pain.  Get regular exercise. Ask your health care provider what activities are safe for you.  Do not lift anything that is heavier than 10 lb (4.5 kg), or the limit that you are told, until your health care provider says that it is safe. Always use proper lifting technique, which includes: ? Bending your knees. ? Keeping the load close to your body. ? Avoiding twisting.  Sleep on a firm mattress in a comfortable position. Try lying on your side with your knees slightly bent. If you lie on your back, put a pillow under your knees.   Medicines  Treatment may include medicines for pain and inflammation taken by mouth or applied to the skin, prescription pain medicine, or muscle relaxants. Take over-the-counter and prescription medicines only as told by your health care provider.  Ask your health care provider if the medicine prescribed to you: ? Requires you to avoid driving or using machinery. ? Can cause constipation. You may need to take these actions to prevent or treat constipation:  Drink enough fluid to keep your urine pale yellow.  Take over-the-counter or prescription medicines.  Eat foods that are high in fiber, such as   beans, whole grains, and fresh fruits and vegetables.  Limit foods that are high in fat and processed sugars, such as fried or sweet foods. General instructions  Do not use any products that contain nicotine or tobacco, such as cigarettes, e-cigarettes, and chewing tobacco. If you need help quitting, ask your health care provider.  Keep all follow-up visits as told by your health care provider. This is important. Contact a health care provider if:  You have pain that is not relieved with  rest or medicine.  Your pain gets worse, or you have new pain.  You have a high fever.  You have rapid weight loss.  You have trouble doing your normal activities. Get help right away if:  You have weakness or numbness in one or both of your legs or feet.  You have trouble controlling your bladder or your bowels.  You have severe back pain and have any of the following: ? Nausea or vomiting. ? Pain in your abdomen. ? Shortness of breath or you faint. Summary  Chronic back pain is back pain that lasts longer than 3 months.  When a flare-up begins, apply ice to the painful area for the first 24-48 hours.  Apply a moist heat pad or use a heating pad on the painful area as directed by your health care provider.  When you are resting for longer periods, mix in some mild activity or stretching between periods of rest. This will help to prevent stiffness and pain. This information is not intended to replace advice given to you by your health care provider. Make sure you discuss any questions you have with your health care provider. Document Revised: 01/26/2020 Document Reviewed: 01/26/2020 Elsevier Patient Education  2021 Elsevier Inc.  

## 2021-02-15 NOTE — Telephone Encounter (Signed)
Previous message was sent to PCP

## 2021-02-15 NOTE — Telephone Encounter (Signed)
Has appt with Tiffany today

## 2021-02-15 NOTE — Telephone Encounter (Signed)
Please advise 
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
## 2021-02-26 ENCOUNTER — Other Ambulatory Visit: Payer: Self-pay | Admitting: Family

## 2021-02-26 DIAGNOSIS — N401 Enlarged prostate with lower urinary tract symptoms: Secondary | ICD-10-CM

## 2021-02-26 DIAGNOSIS — E119 Type 2 diabetes mellitus without complications: Secondary | ICD-10-CM

## 2021-02-26 DIAGNOSIS — Z794 Long term (current) use of insulin: Secondary | ICD-10-CM

## 2021-02-27 ENCOUNTER — Telehealth: Payer: Self-pay

## 2021-02-27 NOTE — Telephone Encounter (Signed)
Pt returned missed call. Reviewed Christys note with patient. Pt voiced understanding.

## 2021-02-27 NOTE — Telephone Encounter (Signed)
There are OTC fungus medications he could take. They are not that effective, unless you use multiple times a day. There are oral medications, but can be hard on your liver.

## 2021-02-27 NOTE — Telephone Encounter (Signed)
Mailbox full

## 2021-02-28 ENCOUNTER — Telehealth: Payer: Self-pay

## 2021-02-28 NOTE — Telephone Encounter (Signed)
Patient aware and verbalized understanding. °

## 2021-03-01 ENCOUNTER — Other Ambulatory Visit: Payer: Self-pay | Admitting: Family

## 2021-03-01 DIAGNOSIS — F419 Anxiety disorder, unspecified: Secondary | ICD-10-CM

## 2021-03-01 DIAGNOSIS — M545 Low back pain, unspecified: Secondary | ICD-10-CM

## 2021-03-01 DIAGNOSIS — G8929 Other chronic pain: Secondary | ICD-10-CM

## 2021-03-02 ENCOUNTER — Other Ambulatory Visit: Payer: Self-pay | Admitting: Family

## 2021-03-02 DIAGNOSIS — F419 Anxiety disorder, unspecified: Secondary | ICD-10-CM

## 2021-03-02 DIAGNOSIS — M545 Low back pain, unspecified: Secondary | ICD-10-CM

## 2021-03-02 DIAGNOSIS — G8929 Other chronic pain: Secondary | ICD-10-CM

## 2021-03-02 DIAGNOSIS — N401 Enlarged prostate with lower urinary tract symptoms: Secondary | ICD-10-CM

## 2021-03-02 DIAGNOSIS — R351 Nocturia: Secondary | ICD-10-CM

## 2021-03-12 DIAGNOSIS — H10412 Chronic giant papillary conjunctivitis, left eye: Secondary | ICD-10-CM | POA: Diagnosis not present

## 2021-03-14 DIAGNOSIS — H40012 Open angle with borderline findings, low risk, left eye: Secondary | ICD-10-CM | POA: Diagnosis not present

## 2021-03-14 DIAGNOSIS — H4089 Other specified glaucoma: Secondary | ICD-10-CM | POA: Diagnosis not present

## 2021-03-14 DIAGNOSIS — H0102B Squamous blepharitis left eye, upper and lower eyelids: Secondary | ICD-10-CM | POA: Diagnosis not present

## 2021-03-14 DIAGNOSIS — H10412 Chronic giant papillary conjunctivitis, left eye: Secondary | ICD-10-CM | POA: Diagnosis not present

## 2021-03-14 DIAGNOSIS — Z794 Long term (current) use of insulin: Secondary | ICD-10-CM | POA: Diagnosis not present

## 2021-03-14 DIAGNOSIS — E119 Type 2 diabetes mellitus without complications: Secondary | ICD-10-CM | POA: Diagnosis not present

## 2021-03-14 DIAGNOSIS — H0102A Squamous blepharitis right eye, upper and lower eyelids: Secondary | ICD-10-CM | POA: Diagnosis not present

## 2021-03-14 DIAGNOSIS — H2512 Age-related nuclear cataract, left eye: Secondary | ICD-10-CM | POA: Diagnosis not present

## 2021-03-14 LAB — HM DIABETES EYE EXAM

## 2021-03-19 ENCOUNTER — Telehealth: Payer: Self-pay

## 2021-03-19 DIAGNOSIS — M545 Low back pain, unspecified: Secondary | ICD-10-CM

## 2021-03-19 NOTE — Telephone Encounter (Signed)
Prescription sent to [facility]. ?

## 2021-03-19 NOTE — Telephone Encounter (Signed)
  Prescription Request  03/19/2021  What is the name of the medication or equipment? Walker  Have you contacted your pharmacy to request a refill? (if applicable) yes  Which pharmacy would you like this sent to? [facility] in [place]   Patient notified that their request is being sent to the clinical staff for review and that they should receive a response within 2 business days.

## 2021-03-23 ENCOUNTER — Telehealth: Payer: Self-pay

## 2021-03-23 DIAGNOSIS — G8929 Other chronic pain: Secondary | ICD-10-CM

## 2021-03-23 NOTE — Telephone Encounter (Signed)
Ok to order and I will sign if insurance will accept my signature without having seen pt.

## 2021-03-26 NOTE — Telephone Encounter (Signed)
Order faxed.

## 2021-03-30 ENCOUNTER — Other Ambulatory Visit: Payer: Self-pay | Admitting: Family

## 2021-03-30 DIAGNOSIS — M545 Low back pain, unspecified: Secondary | ICD-10-CM

## 2021-04-12 DIAGNOSIS — L97521 Non-pressure chronic ulcer of other part of left foot limited to breakdown of skin: Secondary | ICD-10-CM | POA: Diagnosis not present

## 2021-04-26 DIAGNOSIS — M79676 Pain in unspecified toe(s): Secondary | ICD-10-CM | POA: Diagnosis not present

## 2021-04-26 DIAGNOSIS — L84 Corns and callosities: Secondary | ICD-10-CM | POA: Diagnosis not present

## 2021-04-26 DIAGNOSIS — B351 Tinea unguium: Secondary | ICD-10-CM | POA: Diagnosis not present

## 2021-04-26 DIAGNOSIS — E1142 Type 2 diabetes mellitus with diabetic polyneuropathy: Secondary | ICD-10-CM | POA: Diagnosis not present

## 2021-05-08 ENCOUNTER — Encounter: Payer: Self-pay | Admitting: Family

## 2021-05-08 ENCOUNTER — Other Ambulatory Visit: Payer: Self-pay

## 2021-05-08 ENCOUNTER — Ambulatory Visit (INDEPENDENT_AMBULATORY_CARE_PROVIDER_SITE_OTHER): Payer: Medicare Other | Admitting: Family

## 2021-05-08 VITALS — BP 141/72 | HR 86 | Temp 98.5°F | Ht 70.0 in | Wt 228.8 lb

## 2021-05-08 DIAGNOSIS — E119 Type 2 diabetes mellitus without complications: Secondary | ICD-10-CM | POA: Diagnosis not present

## 2021-05-08 DIAGNOSIS — M545 Low back pain, unspecified: Secondary | ICD-10-CM

## 2021-05-08 DIAGNOSIS — K219 Gastro-esophageal reflux disease without esophagitis: Secondary | ICD-10-CM | POA: Diagnosis not present

## 2021-05-08 DIAGNOSIS — M159 Polyosteoarthritis, unspecified: Secondary | ICD-10-CM

## 2021-05-08 DIAGNOSIS — I152 Hypertension secondary to endocrine disorders: Secondary | ICD-10-CM | POA: Diagnosis not present

## 2021-05-08 DIAGNOSIS — Z79899 Other long term (current) drug therapy: Secondary | ICD-10-CM | POA: Diagnosis not present

## 2021-05-08 DIAGNOSIS — E1159 Type 2 diabetes mellitus with other circulatory complications: Secondary | ICD-10-CM | POA: Diagnosis not present

## 2021-05-08 DIAGNOSIS — I739 Peripheral vascular disease, unspecified: Secondary | ICD-10-CM

## 2021-05-08 DIAGNOSIS — Z794 Long term (current) use of insulin: Secondary | ICD-10-CM | POA: Diagnosis not present

## 2021-05-08 DIAGNOSIS — E1169 Type 2 diabetes mellitus with other specified complication: Secondary | ICD-10-CM | POA: Diagnosis not present

## 2021-05-08 DIAGNOSIS — F419 Anxiety disorder, unspecified: Secondary | ICD-10-CM

## 2021-05-08 DIAGNOSIS — G8929 Other chronic pain: Secondary | ICD-10-CM

## 2021-05-08 DIAGNOSIS — F1320 Sedative, hypnotic or anxiolytic dependence, uncomplicated: Secondary | ICD-10-CM

## 2021-05-08 DIAGNOSIS — E785 Hyperlipidemia, unspecified: Secondary | ICD-10-CM

## 2021-05-08 LAB — CBC WITH DIFFERENTIAL/PLATELET
Basophils Absolute: 0 10*3/uL (ref 0.0–0.2)
Basos: 0 %
EOS (ABSOLUTE): 0.2 10*3/uL (ref 0.0–0.4)
Eos: 2 %
Hematocrit: 43.5 % (ref 37.5–51.0)
Hemoglobin: 14.3 g/dL (ref 13.0–17.7)
Immature Grans (Abs): 0 10*3/uL (ref 0.0–0.1)
Immature Granulocytes: 0 %
Lymphocytes Absolute: 1.9 10*3/uL (ref 0.7–3.1)
Lymphs: 25 %
MCH: 29.1 pg (ref 26.6–33.0)
MCHC: 32.9 g/dL (ref 31.5–35.7)
MCV: 88 fL (ref 79–97)
Monocytes Absolute: 0.6 10*3/uL (ref 0.1–0.9)
Monocytes: 8 %
Neutrophils Absolute: 4.8 10*3/uL (ref 1.4–7.0)
Neutrophils: 65 %
Platelets: 318 10*3/uL (ref 150–450)
RBC: 4.92 x10E6/uL (ref 4.14–5.80)
RDW: 13.7 % (ref 11.6–15.4)
WBC: 7.5 10*3/uL (ref 3.4–10.8)

## 2021-05-08 LAB — CMP14+EGFR
ALT: 49 IU/L — ABNORMAL HIGH (ref 0–44)
AST: 42 IU/L — ABNORMAL HIGH (ref 0–40)
Albumin/Globulin Ratio: 1.9 (ref 1.2–2.2)
Albumin: 4.9 g/dL — ABNORMAL HIGH (ref 3.6–4.6)
Alkaline Phosphatase: 91 IU/L (ref 44–121)
BUN/Creatinine Ratio: 12 (ref 10–24)
BUN: 10 mg/dL (ref 8–27)
Bilirubin Total: 0.3 mg/dL (ref 0.0–1.2)
CO2: 24 mmol/L (ref 20–29)
Calcium: 10.2 mg/dL (ref 8.6–10.2)
Chloride: 99 mmol/L (ref 96–106)
Creatinine, Ser: 0.85 mg/dL (ref 0.76–1.27)
Globulin, Total: 2.6 g/dL (ref 1.5–4.5)
Glucose: 173 mg/dL — ABNORMAL HIGH (ref 65–99)
Potassium: 4.2 mmol/L (ref 3.5–5.2)
Sodium: 138 mmol/L (ref 134–144)
Total Protein: 7.5 g/dL (ref 6.0–8.5)
eGFR: 85 mL/min/{1.73_m2} (ref >59)

## 2021-05-08 LAB — BAYER DCA HB A1C WAIVED: HB A1C (BAYER DCA - WAIVED): 7.6 % — ABNORMAL HIGH (ref <7.0)

## 2021-05-08 MED ORDER — DIAZEPAM 5 MG PO TABS: ORAL_TABLET | ORAL | 4 refills | Status: DC
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
## 2021-05-08 NOTE — Patient Instructions (Signed)
https://doi.[email or web address]/[email or web address]">  Chronic Back Pain When back pain lasts longer than 3 months, it is called chronic back pain. The cause of your back pain may not be known. Some common causes include:  Wear and tear (degenerative disease) of the bones, ligaments, or disks in your back.  Inflammation and stiffness in your back (arthritis). People who have chronic back pain often go through certain periods in which the pain is more intense (flare-ups). Many people can learn to manage the pain with home care. Follow these instructions at home: Pay attention to any changes in your symptoms. Take these actions to help with your pain: Managing pain and stiffness  If directed, apply ice to the painful area. Your health care provider may recommend applying ice during the first 24-48 hours after a flare-up begins. To do this: ? Put ice in a plastic bag. ? Place a towel between your skin and the bag. ? Leave the ice on for 20 minutes, 2-3 times per day.  If directed, apply heat to the affected area as often as told by your health care provider. Use the heat source that your health care provider recommends, such as a moist heat pack or a heating pad. ? Place a towel between your skin and the heat source. ? Leave the heat on for 20-30 minutes. ? Remove the heat if your skin turns bright red. This is especially important if you are unable to feel pain, heat, or cold. You may have a greater risk of getting burned.  Try soaking in a warm tub.      Activity  Avoid bending and other activities that make the problem worse.  Maintain a proper position when standing or sitting: ? When standing, keep your upper back and neck straight, with your shoulders pulled back. Avoid slouching. ? When sitting, keep your back straight and relax your shoulders. Do not round your shoulders or pull them backward.  Do not sit or stand in one place for long periods of time.  Take brief periods of rest  throughout the day. This will reduce your pain. Resting in a lying or standing position is usually better than sitting to rest.  When you are resting for longer periods, mix in some mild activity or stretching between periods of rest. This will help to prevent stiffness and pain.  Get regular exercise. Ask your health care provider what activities are safe for you.  Do not lift anything that is heavier than 10 lb (4.5 kg), or the limit that you are told, until your health care provider says that it is safe. Always use proper lifting technique, which includes: ? Bending your knees. ? Keeping the load close to your body. ? Avoiding twisting.  Sleep on a firm mattress in a comfortable position. Try lying on your side with your knees slightly bent. If you lie on your back, put a pillow under your knees.   Medicines  Treatment may include medicines for pain and inflammation taken by mouth or applied to the skin, prescription pain medicine, or muscle relaxants. Take over-the-counter and prescription medicines only as told by your health care provider.  Ask your health care provider if the medicine prescribed to you: ? Requires you to avoid driving or using machinery. ? Can cause constipation. You may need to take these actions to prevent or treat constipation:  Drink enough fluid to keep your urine pale yellow.  Take over-the-counter or prescription medicines.  Eat foods that are high in fiber, such as   beans, whole grains, and fresh fruits and vegetables.  Limit foods that are high in fat and processed sugars, such as fried or sweet foods. General instructions  Do not use any products that contain nicotine or tobacco, such as cigarettes, e-cigarettes, and chewing tobacco. If you need help quitting, ask your health care provider.  Keep all follow-up visits as told by your health care provider. This is important. Contact a health care provider if:  You have pain that is not relieved with  rest or medicine.  Your pain gets worse, or you have new pain.  You have a high fever.  You have rapid weight loss.  You have trouble doing your normal activities. Get help right away if:  You have weakness or numbness in one or both of your legs or feet.  You have trouble controlling your bladder or your bowels.  You have severe back pain and have any of the following: ? Nausea or vomiting. ? Pain in your abdomen. ? Shortness of breath or you faint. Summary  Chronic back pain is back pain that lasts longer than 3 months.  When a flare-up begins, apply ice to the painful area for the first 24-48 hours.  Apply a moist heat pad or use a heating pad on the painful area as directed by your health care provider.  When you are resting for longer periods, mix in some mild activity or stretching between periods of rest. This will help to prevent stiffness and pain. This information is not intended to replace advice given to you by your health care provider. Make sure you discuss any questions you have with your health care provider. Document Revised: 01/26/2020 Document Reviewed: 01/26/2020 Elsevier Patient Education  2021 Elsevier Inc.  

## 2021-05-09 ENCOUNTER — Other Ambulatory Visit: Payer: Self-pay | Admitting: Family

## 2021-05-09 DIAGNOSIS — Z794 Long term (current) use of insulin: Secondary | ICD-10-CM

## 2021-05-09 DIAGNOSIS — E119 Type 2 diabetes mellitus without complications: Secondary | ICD-10-CM

## 2021-05-09 DIAGNOSIS — N401 Enlarged prostate with lower urinary tract symptoms: Secondary | ICD-10-CM

## 2021-05-10 NOTE — Progress Notes (Signed)
R/c about labs

## 2021-05-15 ENCOUNTER — Other Ambulatory Visit: Payer: Self-pay | Admitting: Family

## 2021-05-15 DIAGNOSIS — G8929 Other chronic pain: Secondary | ICD-10-CM

## 2021-05-15 DIAGNOSIS — F419 Anxiety disorder, unspecified: Secondary | ICD-10-CM

## 2021-05-16 ENCOUNTER — Other Ambulatory Visit: Payer: Self-pay | Admitting: Family

## 2021-05-21 ENCOUNTER — Ambulatory Visit (INDEPENDENT_AMBULATORY_CARE_PROVIDER_SITE_OTHER): Payer: Medicare Other

## 2021-05-21 VITALS — Ht 70.0 in | Wt 229.0 lb

## 2021-05-21 DIAGNOSIS — Z0000 Encounter for general adult medical examination without abnormal findings: Secondary | ICD-10-CM

## 2021-05-21 NOTE — Patient Instructions (Signed)
[name] , Thank you for taking time to come for your Medicare Wellness Visit. I appreciate your ongoing commitment to your health goals. Please review the following plan we discussed and let me know if I can assist you in the future.   Screening recommendations/referrals: Colonoscopy: Sigmoidoscopy in 2013 - testing no longer required Recommended yearly ophthalmology/optometry visit for glaucoma screening and checkup Recommended yearly dental visit for hygiene and checkup  Vaccinations: Influenza vaccine: Done 11/07/2020 - Repeat annually Pneumococcal vaccine: Done 03/24/2012 & 05/24/2015 Tdap vaccine: Due (every 10 years) Shingles vaccine: DUE Shingrix discussed. Please contact your pharmacy for coverage information.    Covid-19: Done 03/02/2020, 04/07/2020, & 11/16/2020   Advanced directives: Please bring a copy of your health care power of attorney and living will to the office to be added to your chart at your convenience.  Conditions/risks identified: Aim for 30 minutes of exercise or brisk walking each day, drink 6-8 glasses of water and eat lots of fruits and vegetables.  Next appointment: Follow up in one year for your annual wellness visit.   Preventive Care 45 Years and Older, Male  Preventive care refers to lifestyle choices and visits with your health care provider that can promote health and wellness. What does preventive care include?  A yearly physical exam. This is also called an annual well check.  Dental exams once or twice a year.  Routine eye exams. Ask your health care provider how often you should have your eyes checked.  Personal lifestyle choices, including:  Daily care of your teeth and gums.  Regular physical activity.  Eating a healthy diet.  Avoiding tobacco and drug use.  Limiting alcohol use.  Practicing safe sex.  Taking low doses of aspirin every day.  Taking vitamin and mineral supplements as recommended by your health care provider. What  happens during an annual well check? The services and screenings done by your health care provider during your annual well check will depend on your age, overall health, lifestyle risk factors, and family history of disease. Counseling  Your health care provider may ask you questions about your:  Alcohol use.  Tobacco use.  Drug use.  Emotional well-being.  Home and relationship well-being.  Sexual activity.  Eating habits.  History of falls.  Memory and ability to understand (cognition).  Work and work Statistician. Screening  You may have the following tests or measurements:  Height, weight, and BMI.  Blood pressure.  Lipid and cholesterol levels. These may be checked every 5 years, or more frequently if you are over 61 years old.  Skin check.  Lung cancer screening. You may have this screening every year starting at age 75 if you have a 30-pack-year history of smoking and currently smoke or have quit within the past 15 years.  Fecal occult blood test (FOBT) of the stool. You may have this test every year starting at age 19.  Flexible sigmoidoscopy or colonoscopy. You may have a sigmoidoscopy every 5 years or a colonoscopy every 10 years starting at age 65.  Prostate cancer screening. Recommendations will vary depending on your family history and other risks.  Hepatitis C blood test.  Hepatitis B blood test.  Sexually transmitted disease (STD) testing.  Diabetes screening. This is done by checking your blood sugar (glucose) after you have not eaten for a while (fasting). You may have this done every 1-3 years.  Abdominal aortic aneurysm (AAA) screening. You may need this if you are a current or former smoker.  Osteoporosis. You may be screened starting at age 3 if you are at high risk. Talk with your health care provider about your test results, treatment options, and if necessary, the need for more tests. Vaccines  Your health care provider may recommend  certain vaccines, such as:  Influenza vaccine. This is recommended every year.  Tetanus, diphtheria, and acellular pertussis (Tdap, Td) vaccine. You may need a Td booster every 10 years.  Zoster vaccine. You may need this after age 68.  Pneumococcal 13-valent conjugate (PCV13) vaccine. One dose is recommended after age 36.  Pneumococcal polysaccharide (PPSV23) vaccine. One dose is recommended after age 80. Talk to your health care provider about which screenings and vaccines you need and how often you need them. This information is not intended to replace advice given to you by your health care provider. Make sure you discuss any questions you have with your health care provider. Document Released: 01/12/2016 Document Revised: 09/04/2016 Document Reviewed: 10/17/2015 Elsevier Interactive Patient Education  2017 [employer] Prevention in the Home Falls can cause injuries. They can happen to people of all ages. There are many things you can do to make your home safe and to help prevent falls. What can I do on the outside of my home?  Regularly fix the edges of walkways and driveways and fix any cracks.  Remove anything that might make you trip as you walk through a door, such as a raised step or threshold.  Trim any bushes or trees on the path to your home.  Use bright outdoor lighting.  Clear any walking paths of anything that might make someone trip, such as rocks or tools.  Regularly check to see if handrails are loose or broken. Make sure that both sides of any steps have handrails.  Any raised decks and porches should have guardrails on the edges.  Have any leaves, snow, or ice cleared regularly.  Use sand or salt on walking paths during winter.  Clean up any spills in your garage right away. This includes oil or grease spills. What can I do in the bathroom?  Use night lights.  Install grab bars by the toilet and in the tub and shower. Do not use towel bars as  grab bars.  Use non-skid mats or decals in the tub or shower.  If you need to sit down in the shower, use a plastic, non-slip stool.  Keep the floor dry. Clean up any water that spills on the floor as soon as it happens.  Remove soap buildup in the tub or shower regularly.  Attach bath mats securely with double-sided non-slip rug tape.  Do not have throw rugs and other things on the floor that can make you trip. What can I do in the bedroom?  Use night lights.  Make sure that you have a light by your bed that is easy to reach.  Do not use any sheets or blankets that are too big for your bed. They should not hang down onto the floor.  Have a firm chair that has side arms. You can use this for support while you get dressed.  Do not have throw rugs and other things on the floor that can make you trip. What can I do in the kitchen?  Clean up any spills right away.  Avoid walking on wet floors.  Keep items that you use a lot in easy-to-reach places.  If you need to reach something above you, use a strong step  stool that has a grab bar.  Keep electrical cords out of the way.  Do not use floor polish or wax that makes floors slippery. If you must use wax, use non-skid floor wax.  Do not have throw rugs and other things on the floor that can make you trip. What can I do with my stairs?  Do not leave any items on the stairs.  Make sure that there are handrails on both sides of the stairs and use them. Fix handrails that are broken or loose. Make sure that handrails are as long as the stairways.  Check any carpeting to make sure that it is firmly attached to the stairs. Fix any carpet that is loose or worn.  Avoid having throw rugs at the top or bottom of the stairs. If you do have throw rugs, attach them to the floor with carpet tape.  Make sure that you have a light switch at the top of the stairs and the bottom of the stairs. If you do not have them, ask someone to add them  for you. What else can I do to help prevent falls?  Wear shoes that:  Do not have high heels.  Have rubber bottoms.  Are comfortable and fit you well.  Are closed at the toe. Do not wear sandals.  If you use a stepladder:  Make sure that it is fully opened. Do not climb a closed stepladder.  Make sure that both sides of the stepladder are locked into place.  Ask someone to hold it for you, if possible.  Clearly mark and make sure that you can see:  Any grab bars or handrails.  First and last steps.  Where the edge of each step is.  Use tools that help you move around (mobility aids) if they are needed. These include:  Canes.  Walkers.  Scooters.  Crutches.  Turn on the lights when you go into a dark area. Replace any light bulbs as soon as they burn out.  Set up your furniture so you have a clear path. Avoid moving your furniture around.  If any of your floors are uneven, fix them.  If there are any pets around you, be aware of where they are.  Review your medicines with your doctor. Some medicines can make you feel dizzy. This can increase your chance of falling. Ask your doctor what other things that you can do to help prevent falls. This information is not intended to replace advice given to you by your health care provider. Make sure you discuss any questions you have with your health care provider. Document Released: 10/12/2009 Document Revised: 05/23/2016 Document Reviewed: 01/20/2015 Elsevier Interactive Patient Education  2017 [employer].
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
## 2021-05-23 ENCOUNTER — Telehealth: Payer: Self-pay | Admitting: Family

## 2021-05-23 NOTE — Telephone Encounter (Signed)
  Prescription Request  05/23/2021  What is the name of the medication or equipment? Samples of insulin  Have you contacted your pharmacy to request a refill? (if applicable) no  Which pharmacy would you like this sent to? [facility]   Patient notified that their request is being sent to the clinical staff for review and that they should receive a response within 2 business days.

## 2021-05-24 NOTE — Telephone Encounter (Signed)
No answer, no voicemail.  Samples of Toujeo are in refrigerator in lab for patient to pick up.  No Humalog samples available.

## 2021-05-24 NOTE — Telephone Encounter (Signed)
Pt aware samples are available

## 2021-06-06 ENCOUNTER — Other Ambulatory Visit: Payer: Self-pay | Admitting: Family

## 2021-06-06 DIAGNOSIS — E559 Vitamin D deficiency, unspecified: Secondary | ICD-10-CM

## 2021-06-20 ENCOUNTER — Other Ambulatory Visit: Payer: Self-pay | Admitting: Family

## 2021-06-20 DIAGNOSIS — G8929 Other chronic pain: Secondary | ICD-10-CM

## 2021-06-20 DIAGNOSIS — F419 Anxiety disorder, unspecified: Secondary | ICD-10-CM

## 2021-06-20 DIAGNOSIS — E1159 Type 2 diabetes mellitus with other circulatory complications: Secondary | ICD-10-CM

## 2021-06-20 DIAGNOSIS — R600 Localized edema: Secondary | ICD-10-CM

## 2021-06-20 DIAGNOSIS — R609 Edema, unspecified: Secondary | ICD-10-CM

## 2021-06-29 ENCOUNTER — Other Ambulatory Visit: Payer: Self-pay | Admitting: Family

## 2021-06-29 DIAGNOSIS — E559 Vitamin D deficiency, unspecified: Secondary | ICD-10-CM

## 2021-07-24 DIAGNOSIS — M79676 Pain in unspecified toe(s): Secondary | ICD-10-CM | POA: Diagnosis not present

## 2021-07-24 DIAGNOSIS — L84 Corns and callosities: Secondary | ICD-10-CM | POA: Diagnosis not present

## 2021-07-24 DIAGNOSIS — B351 Tinea unguium: Secondary | ICD-10-CM | POA: Diagnosis not present

## 2021-07-24 DIAGNOSIS — E1142 Type 2 diabetes mellitus with diabetic polyneuropathy: Secondary | ICD-10-CM | POA: Diagnosis not present

## 2021-08-09 ENCOUNTER — Other Ambulatory Visit: Payer: Self-pay | Admitting: Family

## 2021-08-09 DIAGNOSIS — G8929 Other chronic pain: Secondary | ICD-10-CM

## 2021-08-09 DIAGNOSIS — M545 Low back pain, unspecified: Secondary | ICD-10-CM

## 2021-08-09 NOTE — Telephone Encounter (Signed)
Last seen 05/08/21 Future appointment 11/09/21 Medication sent 02/06/21 #[id number] R-1  Please advise

## 2021-08-27 ENCOUNTER — Other Ambulatory Visit: Payer: Self-pay | Admitting: Family

## 2021-08-27 DIAGNOSIS — N401 Enlarged prostate with lower urinary tract symptoms: Secondary | ICD-10-CM

## 2021-08-27 DIAGNOSIS — M545 Low back pain, unspecified: Secondary | ICD-10-CM

## 2021-09-12 ENCOUNTER — Telehealth: Payer: Self-pay | Admitting: Family

## 2021-09-13 ENCOUNTER — Telehealth: Payer: Self-pay | Admitting: Family

## 2021-09-13 NOTE — Telephone Encounter (Signed)
VM box not set up we have no samples.

## 2021-09-18 NOTE — Telephone Encounter (Signed)
Patient aware and verbalized understanding no samples at the moment.

## 2021-09-18 NOTE — Telephone Encounter (Signed)
Do we have samples of toujeo this week?

## 2021-10-01 NOTE — Telephone Encounter (Signed)
Do we have samples for toujeo and vasaglar?

## 2021-10-01 NOTE — Telephone Encounter (Signed)
Patient aware no samples at this time

## 2021-10-09 ENCOUNTER — Other Ambulatory Visit: Payer: Self-pay | Admitting: Family

## 2021-10-09 DIAGNOSIS — N401 Enlarged prostate with lower urinary tract symptoms: Secondary | ICD-10-CM

## 2021-10-09 DIAGNOSIS — R351 Nocturia: Secondary | ICD-10-CM

## 2021-10-18 ENCOUNTER — Telehealth: Payer: Self-pay | Admitting: Family

## 2021-10-18 NOTE — Telephone Encounter (Signed)
Pt called requesting samples of Toujeo because he is almost out.  Checked to see if we had samples and we do not. Made pt aware also.  Pt doesn't know what to do. Says he turned all of his paperwork in more than 4 wks ago for the patient assistance but says he has not heard anything about it.  Please advise and call patient.

## 2021-10-18 NOTE — Telephone Encounter (Signed)
Patient cant afford and he will be out soon what can we do?

## 2021-10-19 NOTE — Telephone Encounter (Signed)
We have samples of Tresiba and Levemir we can give that is the similar to Toujeo.

## 2021-10-19 NOTE — Telephone Encounter (Signed)
Patient aware and verbalized understanding. °

## 2021-10-23 DIAGNOSIS — M79676 Pain in unspecified toe(s): Secondary | ICD-10-CM | POA: Diagnosis not present

## 2021-10-23 DIAGNOSIS — L84 Corns and callosities: Secondary | ICD-10-CM | POA: Diagnosis not present

## 2021-10-23 DIAGNOSIS — E1142 Type 2 diabetes mellitus with diabetic polyneuropathy: Secondary | ICD-10-CM | POA: Diagnosis not present

## 2021-10-23 DIAGNOSIS — B351 Tinea unguium: Secondary | ICD-10-CM | POA: Diagnosis not present

## 2021-11-09 ENCOUNTER — Ambulatory Visit (INDEPENDENT_AMBULATORY_CARE_PROVIDER_SITE_OTHER): Payer: Medicare Other | Admitting: Family

## 2021-11-09 ENCOUNTER — Other Ambulatory Visit: Payer: Self-pay

## 2021-11-09 ENCOUNTER — Encounter: Payer: Self-pay | Admitting: Family

## 2021-11-09 VITALS — BP 181/79 | HR 96 | Temp 98.8°F | Ht 70.0 in | Wt 225.4 lb

## 2021-11-09 DIAGNOSIS — Z23 Encounter for immunization: Secondary | ICD-10-CM | POA: Diagnosis not present

## 2021-11-09 DIAGNOSIS — N401 Enlarged prostate with lower urinary tract symptoms: Secondary | ICD-10-CM

## 2021-11-09 DIAGNOSIS — E119 Type 2 diabetes mellitus without complications: Secondary | ICD-10-CM | POA: Diagnosis not present

## 2021-11-09 DIAGNOSIS — G8929 Other chronic pain: Secondary | ICD-10-CM

## 2021-11-09 DIAGNOSIS — I739 Peripheral vascular disease, unspecified: Secondary | ICD-10-CM | POA: Diagnosis not present

## 2021-11-09 DIAGNOSIS — K219 Gastro-esophageal reflux disease without esophagitis: Secondary | ICD-10-CM

## 2021-11-09 DIAGNOSIS — F419 Anxiety disorder, unspecified: Secondary | ICD-10-CM

## 2021-11-09 DIAGNOSIS — Z79899 Other long term (current) drug therapy: Secondary | ICD-10-CM | POA: Diagnosis not present

## 2021-11-09 DIAGNOSIS — E785 Hyperlipidemia, unspecified: Secondary | ICD-10-CM

## 2021-11-09 DIAGNOSIS — Z794 Long term (current) use of insulin: Secondary | ICD-10-CM | POA: Diagnosis not present

## 2021-11-09 DIAGNOSIS — E1169 Type 2 diabetes mellitus with other specified complication: Secondary | ICD-10-CM

## 2021-11-09 DIAGNOSIS — M545 Low back pain, unspecified: Secondary | ICD-10-CM

## 2021-11-09 DIAGNOSIS — F1320 Sedative, hypnotic or anxiolytic dependence, uncomplicated: Secondary | ICD-10-CM

## 2021-11-09 DIAGNOSIS — M159 Polyosteoarthritis, unspecified: Secondary | ICD-10-CM

## 2021-11-09 DIAGNOSIS — R351 Nocturia: Secondary | ICD-10-CM

## 2021-11-09 LAB — CBC WITH DIFFERENTIAL/PLATELET
Basophils Absolute: 0 10*3/uL (ref 0.0–0.2)
Basos: 1 %
EOS (ABSOLUTE): 0.2 10*3/uL (ref 0.0–0.4)
Eos: 3 %
Hematocrit: 40.2 % (ref 37.5–51.0)
Hemoglobin: 14.2 g/dL (ref 13.0–17.7)
Immature Grans (Abs): 0 10*3/uL (ref 0.0–0.1)
Immature Granulocytes: 0 %
Lymphocytes Absolute: 2.1 10*3/uL (ref 0.7–3.1)
Lymphs: 29 %
MCH: 30.5 pg (ref 26.6–33.0)
MCHC: 35.3 g/dL (ref 31.5–35.7)
MCV: 86 fL (ref 79–97)
Monocytes Absolute: 0.6 10*3/uL (ref 0.1–0.9)
Monocytes: 9 %
Neutrophils Absolute: 4.1 10*3/uL (ref 1.4–7.0)
Neutrophils: 58 %
Platelets: 309 10*3/uL (ref 150–450)
RBC: 4.66 x10E6/uL (ref 4.14–5.80)
RDW: 13.2 % (ref 11.6–15.4)
WBC: 7 10*3/uL (ref 3.4–10.8)

## 2021-11-09 LAB — CMP14+EGFR
ALT: 52 IU/L — ABNORMAL HIGH (ref 0–44)
AST: 68 IU/L — ABNORMAL HIGH (ref 0–40)
Albumin/Globulin Ratio: 2.3 — ABNORMAL HIGH (ref 1.2–2.2)
Albumin: 5.1 g/dL — ABNORMAL HIGH (ref 3.6–4.6)
Alkaline Phosphatase: 79 IU/L (ref 44–121)
BUN/Creatinine Ratio: 11 (ref 10–24)
BUN: 10 mg/dL (ref 8–27)
Bilirubin Total: 0.4 mg/dL (ref 0.0–1.2)
CO2: 21 mmol/L (ref 20–29)
Calcium: 10.1 mg/dL (ref 8.6–10.2)
Chloride: 99 mmol/L (ref 96–106)
Creatinine, Ser: 0.91 mg/dL (ref 0.76–1.27)
Globulin, Total: 2.2 g/dL (ref 1.5–4.5)
Glucose: 174 mg/dL — ABNORMAL HIGH (ref 70–99)
Potassium: 4.3 mmol/L (ref 3.5–5.2)
Sodium: 138 mmol/L (ref 134–144)
Total Protein: 7.3 g/dL (ref 6.0–8.5)
eGFR: 82 mL/min/{1.73_m2} (ref >59)

## 2021-11-09 LAB — LIPID PANEL
Chol/HDL Ratio: 4.5 ratio (ref 0.0–5.0)
Cholesterol, Total: 222 mg/dL — ABNORMAL HIGH (ref 100–199)
HDL: 49 mg/dL (ref >39)
LDL Chol Calc (NIH): 142 mg/dL — ABNORMAL HIGH (ref 0–99)
Triglycerides: 170 mg/dL — ABNORMAL HIGH (ref 0–149)
VLDL Cholesterol Cal: 31 mg/dL (ref 5–40)

## 2021-11-09 LAB — BAYER DCA HB A1C WAIVED: HB A1C (BAYER DCA - WAIVED): 7.6 % — ABNORMAL HIGH (ref 4.8–5.6)

## 2021-11-09 MED ORDER — DIAZEPAM 5 MG PO TABS: ORAL_TABLET | ORAL | 4 refills | Status: DC
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
## 2021-11-09 NOTE — Patient Instructions (Signed)

## 2021-11-10 LAB — MICROALBUMIN / CREATININE URINE RATIO
Creatinine, Urine: 76.5 mg/dL
Microalb/Creat Ratio: 73 mg/g creat — ABNORMAL HIGH (ref 0–29)
Microalbumin, Urine: 56.2 ug/mL

## 2021-11-14 LAB — TOXASSURE SELECT 13 (MW), URINE

## 2021-12-06 DIAGNOSIS — Z23 Encounter for immunization: Secondary | ICD-10-CM | POA: Diagnosis not present

## 2021-12-09 ENCOUNTER — Other Ambulatory Visit: Payer: Self-pay | Admitting: Family

## 2021-12-09 DIAGNOSIS — E119 Type 2 diabetes mellitus without complications: Secondary | ICD-10-CM

## 2021-12-09 DIAGNOSIS — N401 Enlarged prostate with lower urinary tract symptoms: Secondary | ICD-10-CM

## 2021-12-09 DIAGNOSIS — G8929 Other chronic pain: Secondary | ICD-10-CM

## 2021-12-09 DIAGNOSIS — M545 Low back pain, unspecified: Secondary | ICD-10-CM

## 2021-12-09 DIAGNOSIS — E1159 Type 2 diabetes mellitus with other circulatory complications: Secondary | ICD-10-CM

## 2021-12-12 ENCOUNTER — Other Ambulatory Visit: Payer: Self-pay | Admitting: Family

## 2021-12-18 ENCOUNTER — Other Ambulatory Visit: Payer: Self-pay | Admitting: Family

## 2021-12-18 DIAGNOSIS — F419 Anxiety disorder, unspecified: Secondary | ICD-10-CM

## 2021-12-18 DIAGNOSIS — M545 Low back pain, unspecified: Secondary | ICD-10-CM

## 2022-01-09 ENCOUNTER — Other Ambulatory Visit: Payer: Self-pay | Admitting: Family

## 2022-01-09 DIAGNOSIS — I152 Hypertension secondary to endocrine disorders: Secondary | ICD-10-CM

## 2022-01-09 DIAGNOSIS — R609 Edema, unspecified: Secondary | ICD-10-CM

## 2022-01-11 ENCOUNTER — Telehealth: Payer: Self-pay | Admitting: Family

## 2022-01-11 NOTE — Telephone Encounter (Signed)
Pt aware - bedtime per record

## 2022-01-22 DIAGNOSIS — L84 Corns and callosities: Secondary | ICD-10-CM | POA: Diagnosis not present

## 2022-01-22 DIAGNOSIS — M79676 Pain in unspecified toe(s): Secondary | ICD-10-CM | POA: Diagnosis not present

## 2022-01-22 DIAGNOSIS — B351 Tinea unguium: Secondary | ICD-10-CM | POA: Diagnosis not present

## 2022-01-22 DIAGNOSIS — E1142 Type 2 diabetes mellitus with diabetic polyneuropathy: Secondary | ICD-10-CM | POA: Diagnosis not present

## 2022-01-24 ENCOUNTER — Ambulatory Visit: Payer: Medicare Other | Admitting: Family

## 2022-01-29 ENCOUNTER — Other Ambulatory Visit: Payer: Self-pay | Admitting: Family

## 2022-01-29 DIAGNOSIS — N401 Enlarged prostate with lower urinary tract symptoms: Secondary | ICD-10-CM

## 2022-01-29 DIAGNOSIS — R351 Nocturia: Secondary | ICD-10-CM

## 2022-01-30 ENCOUNTER — Telehealth: Payer: Self-pay | Admitting: Family

## 2022-01-30 NOTE — Telephone Encounter (Signed)
Do you know what letter patient is referring to?

## 2022-02-01 ENCOUNTER — Other Ambulatory Visit: Payer: Self-pay | Admitting: Family

## 2022-02-01 ENCOUNTER — Telehealth: Payer: Self-pay | Admitting: Family

## 2022-02-01 NOTE — Progress Notes (Signed)
Please give Toujeo samples. He does not need Humalog.

## 2022-02-01 NOTE — Progress Notes (Signed)
Sample in fridge patient aware

## 2022-02-01 NOTE — Telephone Encounter (Signed)
Pt wants to know if we have samples of Tujeo or Tresiba or Humalog because he is almost completely out.   Please advise and call patient.

## 2022-02-01 NOTE — Telephone Encounter (Signed)
Waiting for provider to see which samples to give

## 2022-02-01 NOTE — Telephone Encounter (Signed)
Pt returned missed call regarding update on status of when he will get his medications through [facility]. Explained to pt that I have sent a message to his nurse to see if we have samples of Tujeo, Tresiba, or Humalog and also explained that our pharmacist Almyra Free sent a message out this morning stating that patient assistance programs are behind on sending pts their shipments up to 6 wks.  Pt voiced understanding to all this and will wait on call back.

## 2022-02-07 ENCOUNTER — Ambulatory Visit (INDEPENDENT_AMBULATORY_CARE_PROVIDER_SITE_OTHER): Payer: Medicare Other | Admitting: Family

## 2022-02-07 ENCOUNTER — Encounter: Payer: Self-pay | Admitting: Family

## 2022-02-07 VITALS — BP 146/66 | HR 89 | Temp 97.8°F | Ht 70.0 in | Wt 233.0 lb

## 2022-02-07 DIAGNOSIS — R351 Nocturia: Secondary | ICD-10-CM

## 2022-02-07 DIAGNOSIS — I10 Essential (primary) hypertension: Secondary | ICD-10-CM | POA: Diagnosis not present

## 2022-02-07 DIAGNOSIS — Z794 Long term (current) use of insulin: Secondary | ICD-10-CM | POA: Diagnosis not present

## 2022-02-07 DIAGNOSIS — N401 Enlarged prostate with lower urinary tract symptoms: Secondary | ICD-10-CM

## 2022-02-07 DIAGNOSIS — R300 Dysuria: Secondary | ICD-10-CM | POA: Diagnosis not present

## 2022-02-07 DIAGNOSIS — R350 Frequency of micturition: Secondary | ICD-10-CM | POA: Diagnosis not present

## 2022-02-07 DIAGNOSIS — E119 Type 2 diabetes mellitus without complications: Secondary | ICD-10-CM | POA: Diagnosis not present

## 2022-02-07 LAB — URINALYSIS, COMPLETE
Bilirubin, UA: NEGATIVE
Glucose, UA: NEGATIVE
Ketones, UA: NEGATIVE
Leukocytes,UA: NEGATIVE
Nitrite, UA: NEGATIVE
Protein,UA: NEGATIVE
RBC, UA: NEGATIVE
Specific Gravity, UA: 1.02 (ref 1.005–1.030)
Urobilinogen, Ur: 0.2 mg/dL (ref 0.2–1.0)
pH, UA: 6 (ref 5.0–7.5)

## 2022-02-07 LAB — MICROSCOPIC EXAMINATION
Bacteria, UA: NONE SEEN
RBC, Urine: NONE SEEN /hpf (ref 0–2)
Renal Epithel, UA: NONE SEEN /hpf

## 2022-02-07 NOTE — Progress Notes (Signed)
Subjective:    Patient ID: [name], male    DOB: 1933/01/07, 88 y.o.   MRN: [id number]  Chief Complaint  Patient presents with   kidney issues    PT presents to the office today with urinary frequency and states he is waking up 10 times a night.   He takes lasix 20 mg daily. Some times if he has increased swelling he takes two tablets. He reports he has been taking these at night time.  Urinary Frequency  This is a new problem. The current episode started in the past 7 days. The problem occurs every urination. The problem has been gradually worsening. The patient is experiencing no pain. Associated symptoms include frequency and urgency. Pertinent negatives include no hematuria, hesitancy, nausea or vomiting. He has tried increased fluids for the symptoms. The treatment provided no relief.  Hypertension This is a chronic problem. The current episode started more than 1 year ago. The problem has been waxing and waning since onset. The problem is uncontrolled. Associated symptoms include malaise/fatigue. Pertinent negatives include no blurred vision, peripheral edema or shortness of breath. Risk factors for coronary artery disease include dyslipidemia, obesity and male gender. The current treatment provides moderate improvement.  Diabetes He presents for his follow-up diabetic visit. He has type 2 diabetes mellitus. Associated symptoms include foot paresthesias. Pertinent negatives for diabetes include no blurred vision. Symptoms are stable. Diabetic complications include heart disease. Pertinent negatives for diabetic complications include no nephropathy. Risk factors for coronary artery disease include dyslipidemia, diabetes mellitus, male sex, hypertension and sedentary lifestyle. He is following a generally unhealthy diet. His overall blood glucose range is 130-140 mg/dl.     Review of Systems  Constitutional:  Positive for malaise/fatigue.  Eyes:  Negative for blurred vision.   Respiratory:  Negative for shortness of breath.   Gastrointestinal:  Negative for nausea and vomiting.  Genitourinary:  Positive for frequency and urgency. Negative for hematuria and hesitancy.  All other systems reviewed and are negative.     Objective:   Physical Exam Vitals reviewed.  Constitutional:      General: He is not in acute distress.    Appearance: He is well-developed. He is obese.  HENT:     Head: Normocephalic.     Right Ear: Tympanic membrane normal.     Left Ear: Tympanic membrane normal.  Eyes:     General:        Right eye: No discharge.        Left eye: No discharge.     Pupils: Pupils are equal, round, and reactive to light.  Neck:     Thyroid: No thyromegaly.  Cardiovascular:     Rate and Rhythm: Normal rate and regular rhythm.     Heart sounds: Normal heart sounds. No murmur heard. Pulmonary:     Effort: Pulmonary effort is normal. No respiratory distress.     Breath sounds: Normal breath sounds. No wheezing.  Abdominal:     General: Bowel sounds are normal. There is no distension.     Palpations: Abdomen is soft.     Tenderness: There is no abdominal tenderness.  Musculoskeletal:        General: No tenderness. Normal range of motion.     Cervical back: Normal range of motion and neck supple.  Skin:    General: Skin is warm and dry.     Findings: No erythema or rash.  Neurological:     Mental Status: He is alert and oriented  to person, place, and time.     Cranial Nerves: No cranial nerve deficit.     Deep Tendon Reflexes: Reflexes are normal and symmetric.  Psychiatric:        Behavior: Behavior normal.        Thought Content: Thought content normal.        Judgment: Judgment normal.      BP (!) 192/99    Pulse 97    Temp 97.8 F (36.6 C) (Temporal)    Ht 5\' 10"  (1.778 m)    Wt 233 lb (105.7 kg)    BMI 33.43 kg/m      Assessment & Plan:  [name] comes in today with chief complaint of kidney issues    Diagnosis and orders  addressed:  1. Urinary Frequency  No infection - Urinalysis, Complete  2. Type 2 diabetes mellitus treated with insulin (HCC) Continue medications  Low carb diet  3. Benign prostatic hyperplasia with nocturia Continue Proscar 5 mg and Flomax  Start taking lasix in AM   4. Primary hypertension Improved once he sat Continue medications    Health Maintenance reviewed Diet and exercise encouraged  Follow up plan: Keep chronic follow up, if urinary frequency continue may start OAB medication.    [name], FNP

## 2022-02-07 NOTE — Patient Instructions (Signed)

## 2022-03-01 ENCOUNTER — Other Ambulatory Visit: Payer: Self-pay | Admitting: Family

## 2022-03-01 DIAGNOSIS — E559 Vitamin D deficiency, unspecified: Secondary | ICD-10-CM

## 2022-03-13 ENCOUNTER — Other Ambulatory Visit: Payer: Self-pay | Admitting: Family

## 2022-03-13 DIAGNOSIS — E119 Type 2 diabetes mellitus without complications: Secondary | ICD-10-CM

## 2022-03-13 DIAGNOSIS — G8929 Other chronic pain: Secondary | ICD-10-CM

## 2022-03-29 ENCOUNTER — Telehealth: Payer: Self-pay | Admitting: Family

## 2022-03-29 DIAGNOSIS — I10 Essential (primary) hypertension: Secondary | ICD-10-CM

## 2022-03-29 NOTE — Telephone Encounter (Signed)
?  Prescription Request ? ?03/29/2022 ? ?Is this a "Controlled Substance" medicine? no ? ?Have you seen your PCP in the last 2 weeks? no ? ?If YES, route message to pool  -  If NO, patient needs to be scheduled for appointment. ? ?What is the name of the medication or equipment? Needs a RX for blood pressure monitor and a knee brace ? ?Have you contacted your pharmacy to request a refill? NO  ? ?Which pharmacy would you like this sent to? [facility] in [place] ? ? ?Patient notified that their request is being sent to the clinical staff for review and that they should receive a response within 2 business days.  ?  ?

## 2022-04-04 NOTE — Telephone Encounter (Signed)
Patient calling to check on the status on knee brace and bp monitor.  ?

## 2022-04-08 ENCOUNTER — Other Ambulatory Visit: Payer: Self-pay | Admitting: Family

## 2022-04-08 DIAGNOSIS — N401 Enlarged prostate with lower urinary tract symptoms: Secondary | ICD-10-CM

## 2022-04-08 NOTE — Telephone Encounter (Signed)
Patient aware [facility] to [facility] and we could not sent in knee brace for him to get free ?

## 2022-04-09 NOTE — Telephone Encounter (Signed)
Pt needs RX refilled. ?

## 2022-04-18 ENCOUNTER — Ambulatory Visit: Payer: Medicare Other | Admitting: Family Medicine

## 2022-04-23 DIAGNOSIS — E1142 Type 2 diabetes mellitus with diabetic polyneuropathy: Secondary | ICD-10-CM | POA: Diagnosis not present

## 2022-04-23 DIAGNOSIS — M79676 Pain in unspecified toe(s): Secondary | ICD-10-CM | POA: Diagnosis not present

## 2022-04-23 DIAGNOSIS — L84 Corns and callosities: Secondary | ICD-10-CM | POA: Diagnosis not present

## 2022-04-23 DIAGNOSIS — B351 Tinea unguium: Secondary | ICD-10-CM | POA: Diagnosis not present

## 2022-05-08 ENCOUNTER — Other Ambulatory Visit: Payer: Self-pay | Admitting: Family

## 2022-05-08 DIAGNOSIS — E1159 Type 2 diabetes mellitus with other circulatory complications: Secondary | ICD-10-CM

## 2022-05-08 DIAGNOSIS — M545 Low back pain, unspecified: Secondary | ICD-10-CM

## 2022-05-13 ENCOUNTER — Ambulatory Visit (INDEPENDENT_AMBULATORY_CARE_PROVIDER_SITE_OTHER): Payer: Medicare Other | Admitting: Family

## 2022-05-13 ENCOUNTER — Encounter: Payer: Self-pay | Admitting: Family

## 2022-05-13 VITALS — BP 141/64 | HR 80 | Temp 97.6°F | Ht 70.0 in | Wt 227.2 lb

## 2022-05-13 DIAGNOSIS — Z794 Long term (current) use of insulin: Secondary | ICD-10-CM

## 2022-05-13 DIAGNOSIS — E559 Vitamin D deficiency, unspecified: Secondary | ICD-10-CM

## 2022-05-13 DIAGNOSIS — I739 Peripheral vascular disease, unspecified: Secondary | ICD-10-CM | POA: Diagnosis not present

## 2022-05-13 DIAGNOSIS — N401 Enlarged prostate with lower urinary tract symptoms: Secondary | ICD-10-CM

## 2022-05-13 DIAGNOSIS — R350 Frequency of micturition: Secondary | ICD-10-CM

## 2022-05-13 DIAGNOSIS — F419 Anxiety disorder, unspecified: Secondary | ICD-10-CM

## 2022-05-13 DIAGNOSIS — E119 Type 2 diabetes mellitus without complications: Secondary | ICD-10-CM

## 2022-05-13 DIAGNOSIS — M545 Low back pain, unspecified: Secondary | ICD-10-CM | POA: Diagnosis not present

## 2022-05-13 DIAGNOSIS — I10 Essential (primary) hypertension: Secondary | ICD-10-CM | POA: Diagnosis not present

## 2022-05-13 DIAGNOSIS — Z79899 Other long term (current) drug therapy: Secondary | ICD-10-CM | POA: Diagnosis not present

## 2022-05-13 DIAGNOSIS — G8929 Other chronic pain: Secondary | ICD-10-CM | POA: Diagnosis not present

## 2022-05-13 DIAGNOSIS — K219 Gastro-esophageal reflux disease without esophagitis: Secondary | ICD-10-CM

## 2022-05-13 DIAGNOSIS — R351 Nocturia: Secondary | ICD-10-CM

## 2022-05-13 DIAGNOSIS — M159 Polyosteoarthritis, unspecified: Secondary | ICD-10-CM | POA: Diagnosis not present

## 2022-05-13 DIAGNOSIS — F1320 Sedative, hypnotic or anxiolytic dependence, uncomplicated: Secondary | ICD-10-CM

## 2022-05-13 LAB — URINALYSIS, COMPLETE
Bilirubin, UA: NEGATIVE
Ketones, UA: NEGATIVE
Leukocytes,UA: NEGATIVE
Nitrite, UA: NEGATIVE
Protein,UA: NEGATIVE
RBC, UA: NEGATIVE
Specific Gravity, UA: 1.015 (ref 1.005–1.030)
Urobilinogen, Ur: 0.2 mg/dL (ref 0.2–1.0)
pH, UA: 7 (ref 5.0–7.5)

## 2022-05-13 LAB — MICROSCOPIC EXAMINATION
Bacteria, UA: NONE SEEN
Epithelial Cells (non renal): NONE SEEN /hpf (ref 0–10)
RBC, Urine: NONE SEEN /hpf (ref 0–2)
Renal Epithel, UA: NONE SEEN /hpf
WBC, UA: NONE SEEN /hpf (ref 0–5)

## 2022-05-13 MED ORDER — GABAPENTIN 100 MG PO CAPS: 100.0000 mg | ORAL_CAPSULE | Freq: Two times a day (BID) | ORAL | 1 refills | Status: DC

## 2022-05-13 MED ORDER — DULOXETINE HCL 30 MG PO CPEP: ORAL_CAPSULE | ORAL | 1 refills | Status: DC

## 2022-05-13 MED ORDER — LOSARTAN POTASSIUM 100 MG PO TABS: 100.0000 mg | ORAL_TABLET | Freq: Every day | ORAL | 1 refills | Status: DC

## 2022-05-13 MED ORDER — METFORMIN HCL ER 500 MG PO TB24: ORAL_TABLET | ORAL | 1 refills | Status: DC

## 2022-05-13 MED ORDER — DIAZEPAM 5 MG PO TABS: ORAL_TABLET | ORAL | 4 refills | Status: DC

## 2022-05-13 MED ORDER — FINASTERIDE 5 MG PO TABS: 5.0000 mg | ORAL_TABLET | Freq: Every day | ORAL | 1 refills | Status: DC
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
## 2022-05-13 NOTE — Patient Instructions (Signed)

## 2022-05-14 ENCOUNTER — Telehealth: Payer: Self-pay | Admitting: Family

## 2022-05-14 LAB — CMP14+EGFR
ALT: 40 IU/L (ref 0–44)
AST: 44 IU/L — ABNORMAL HIGH (ref 0–40)
Albumin/Globulin Ratio: 1.6 (ref 1.2–2.2)
Albumin: 4.7 g/dL — ABNORMAL HIGH (ref 3.6–4.6)
Alkaline Phosphatase: 80 IU/L (ref 44–121)
BUN/Creatinine Ratio: 10 (ref 10–24)
BUN: 9 mg/dL (ref 8–27)
Bilirubin Total: 0.3 mg/dL (ref 0.0–1.2)
CO2: 23 mmol/L (ref 20–29)
Calcium: 10.1 mg/dL (ref 8.6–10.2)
Chloride: 101 mmol/L (ref 96–106)
Creatinine, Ser: 0.87 mg/dL (ref 0.76–1.27)
Globulin, Total: 2.9 g/dL (ref 1.5–4.5)
Glucose: 184 mg/dL — ABNORMAL HIGH (ref 70–99)
Potassium: 4.4 mmol/L (ref 3.5–5.2)
Sodium: 140 mmol/L (ref 134–144)
Total Protein: 7.6 g/dL (ref 6.0–8.5)
eGFR: 84 mL/min/{1.73_m2} (ref >59)

## 2022-05-14 LAB — CBC WITH DIFFERENTIAL/PLATELET
Basophils Absolute: 0 10*3/uL (ref 0.0–0.2)
Basos: 1 %
EOS (ABSOLUTE): 0.2 10*3/uL (ref 0.0–0.4)
Eos: 3 %
Hematocrit: 38.4 % (ref 37.5–51.0)
Hemoglobin: 13.3 g/dL (ref 13.0–17.7)
Immature Grans (Abs): 0 10*3/uL (ref 0.0–0.1)
Immature Granulocytes: 0 %
Lymphocytes Absolute: 1.8 10*3/uL (ref 0.7–3.1)
Lymphs: 29 %
MCH: 30.4 pg (ref 26.6–33.0)
MCHC: 34.6 g/dL (ref 31.5–35.7)
MCV: 88 fL (ref 79–97)
Monocytes Absolute: 0.5 10*3/uL (ref 0.1–0.9)
Monocytes: 8 %
Neutrophils Absolute: 3.7 10*3/uL (ref 1.4–7.0)
Neutrophils: 59 %
Platelets: 340 10*3/uL (ref 150–450)
RBC: 4.38 x10E6/uL (ref 4.14–5.80)
RDW: 13.6 % (ref 11.6–15.4)
WBC: 6.3 10*3/uL (ref 3.4–10.8)

## 2022-05-14 NOTE — Telephone Encounter (Signed)
Christy, ? ?Toujeo max is on pts med list. He states that Toujeo is expensive and he will use Flex touch when he is not able to afford it. Is it alright to give him samples? ?

## 2022-05-14 NOTE — Telephone Encounter (Signed)
[place] for samples.  ? ? ?Dale Dun, FNP ? ? ?

## 2022-05-15 ENCOUNTER — Telehealth: Payer: Self-pay | Admitting: Emergency Medicine

## 2022-05-15 NOTE — Telephone Encounter (Signed)
Patient picked up samples

## 2022-05-15 NOTE — Telephone Encounter (Signed)
Samples given to patient  

## 2022-05-22 ENCOUNTER — Ambulatory Visit (INDEPENDENT_AMBULATORY_CARE_PROVIDER_SITE_OTHER): Payer: Medicare Other

## 2022-05-22 VITALS — Wt 227.0 lb

## 2022-05-22 DIAGNOSIS — Z0000 Encounter for general adult medical examination without abnormal findings: Secondary | ICD-10-CM | POA: Diagnosis not present

## 2022-05-22 NOTE — Patient Instructions (Signed)
[name] , Thank you for taking time to come for your Medicare Wellness Visit. I appreciate your ongoing commitment to your health goals. Please review the following plan we discussed and let me know if I can assist you in the future.   Screening recommendations/referrals: Colonoscopy: No longer required Recommended yearly ophthalmology/optometry visit for glaucoma screening and checkup Recommended yearly dental visit for hygiene and checkup  Vaccinations: Influenza vaccine: Done 11/09/2021 - Repeat annually Pneumococcal vaccine: Done  03/24/2012 & 05/24/2015 Tdap vaccine: Due - Recommended every 10 years  Shingles vaccine: Due - Shingrix is 2 doses 2-6 months apart and over 90% effective     Covid-19: Done 03/02/2020, 04/07/2020, & 11/16/2020   Advanced directives: Advance directive discussed with you today. I have provided a copy for you to complete at home and have notarized. Once this is complete please bring a copy in to our office so we can scan it into your chart.   Conditions/risks identified: Aim for 30 minutes of exercise or brisk walking, 6-8 glasses of water, and 5 servings of fruits and vegetables each day.   Next appointment: Follow up in one year for your annual wellness visit.   Preventive Care 81 Years and Older, Male  Preventive care refers to lifestyle choices and visits with your health care provider that can promote health and wellness. What does preventive care include? A yearly physical exam. This is also called an annual well check. Dental exams once or twice a year. Routine eye exams. Ask your health care provider how often you should have your eyes checked. Personal lifestyle choices, including: Daily care of your teeth and gums. Regular physical activity. Eating a healthy diet. Avoiding tobacco and drug use. Limiting alcohol use. Practicing safe sex. Taking low doses of aspirin every day. Taking vitamin and mineral supplements as recommended by your health  care provider. What happens during an annual well check? The services and screenings done by your health care provider during your annual well check will depend on your age, overall health, lifestyle risk factors, and family history of disease. Counseling  Your health care provider may ask you questions about your: Alcohol use. Tobacco use. Drug use. Emotional well-being. Home and relationship well-being. Sexual activity. Eating habits. History of falls. Memory and ability to understand (cognition). Work and work Statistician. Screening  You may have the following tests or measurements: Height, weight, and BMI. Blood pressure. Lipid and cholesterol levels. These may be checked every 5 years, or more frequently if you are over 53 years old. Skin check. Lung cancer screening. You may have this screening every year starting at age 55 if you have a 30-pack-year history of smoking and currently smoke or have quit within the past 15 years. Fecal occult blood test (FOBT) of the stool. You may have this test every year starting at age 77. Flexible sigmoidoscopy or colonoscopy. You may have a sigmoidoscopy every 5 years or a colonoscopy every 10 years starting at age 51. Prostate cancer screening. Recommendations will vary depending on your family history and other risks. Hepatitis C blood test. Hepatitis B blood test. Sexually transmitted disease (STD) testing. Diabetes screening. This is done by checking your blood sugar (glucose) after you have not eaten for a while (fasting). You may have this done every 1-3 years. Abdominal aortic aneurysm (AAA) screening. You may need this if you are a current or former smoker. Osteoporosis. You may be screened starting at age 66 if you are at high risk. Talk with  your health care provider about your test results, treatment options, and if necessary, the need for more tests. Vaccines  Your health care provider may recommend certain vaccines, such  as: Influenza vaccine. This is recommended every year. Tetanus, diphtheria, and acellular pertussis (Tdap, Td) vaccine. You may need a Td booster every 10 years. Zoster vaccine. You may need this after age 22. Pneumococcal 13-valent conjugate (PCV13) vaccine. One dose is recommended after age 11. Pneumococcal polysaccharide (PPSV23) vaccine. One dose is recommended after age 82. Talk to your health care provider about which screenings and vaccines you need and how often you need them. This information is not intended to replace advice given to you by your health care provider. Make sure you discuss any questions you have with your health care provider. Document Released: 01/12/2016 Document Revised: 09/04/2016 Document Reviewed: 10/17/2015 Elsevier Interactive Patient Education  2017 [place] Prevention in the Home Falls can cause injuries. They can happen to people of all ages. There are many things you can do to make your home safe and to help prevent falls. What can I do on the outside of my home? Regularly fix the edges of walkways and driveways and fix any cracks. Remove anything that might make you trip as you walk through a door, such as a raised step or threshold. Trim any bushes or trees on the path to your home. Use bright outdoor lighting. Clear any walking paths of anything that might make someone trip, such as rocks or tools. Regularly check to see if handrails are loose or broken. Make sure that both sides of any steps have handrails. Any raised decks and porches should have guardrails on the edges. Have any leaves, snow, or ice cleared regularly. Use sand or salt on walking paths during winter. Clean up any spills in your garage right away. This includes oil or grease spills. What can I do in the bathroom? Use night lights. Install grab bars by the toilet and in the tub and shower. Do not use towel bars as grab bars. Use non-skid mats or decals in the tub or  shower. If you need to sit down in the shower, use a plastic, non-slip stool. Keep the floor dry. Clean up any water that spills on the floor as soon as it happens. Remove soap buildup in the tub or shower regularly. Attach bath mats securely with double-sided non-slip rug tape. Do not have throw rugs and other things on the floor that can make you trip. What can I do in the bedroom? Use night lights. Make sure that you have a light by your bed that is easy to reach. Do not use any sheets or blankets that are too big for your bed. They should not hang down onto the floor. Have a firm chair that has side arms. You can use this for support while you get dressed. Do not have throw rugs and other things on the floor that can make you trip. What can I do in the kitchen? Clean up any spills right away. Avoid walking on wet floors. Keep items that you use a lot in easy-to-reach places. If you need to reach something above you, use a strong step stool that has a grab bar. Keep electrical cords out of the way. Do not use floor polish or wax that makes floors slippery. If you must use wax, use non-skid floor wax. Do not have throw rugs and other things on the floor that can make you trip.  What can I do with my stairs? Do not leave any items on the stairs. Make sure that there are handrails on both sides of the stairs and use them. Fix handrails that are broken or loose. Make sure that handrails are as long as the stairways. Check any carpeting to make sure that it is firmly attached to the stairs. Fix any carpet that is loose or worn. Avoid having throw rugs at the top or bottom of the stairs. If you do have throw rugs, attach them to the floor with carpet tape. Make sure that you have a light switch at the top of the stairs and the bottom of the stairs. If you do not have them, ask someone to add them for you. What else can I do to help prevent falls? Wear shoes that: Do not have high heels. Have  rubber bottoms. Are comfortable and fit you well. Are closed at the toe. Do not wear sandals. If you use a stepladder: Make sure that it is fully opened. Do not climb a closed stepladder. Make sure that both sides of the stepladder are locked into place. Ask someone to hold it for you, if possible. Clearly mark and make sure that you can see: Any grab bars or handrails. First and last steps. Where the edge of each step is. Use tools that help you move around (mobility aids) if they are needed. These include: Canes. Walkers. Scooters. Crutches. Turn on the lights when you go into a dark area. Replace any light bulbs as soon as they burn out. Set up your furniture so you have a clear path. Avoid moving your furniture around. If any of your floors are uneven, fix them. If there are any pets around you, be aware of where they are. Review your medicines with your doctor. Some medicines can make you feel dizzy. This can increase your chance of falling. Ask your doctor what other things that you can do to help prevent falls. This information is not intended to replace advice given to you by your health care provider. Make sure you discuss any questions you have with your health care provider. Document Released: 10/12/2009 Document Revised: 05/23/2016 Document Reviewed: 01/20/2015 Elsevier Interactive Patient Education  2017 [employer].
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
## 2022-06-03 ENCOUNTER — Ambulatory Visit: Payer: Medicare Other | Admitting: Urology

## 2022-06-12 ENCOUNTER — Telehealth: Payer: Self-pay | Admitting: Family

## 2022-06-12 ENCOUNTER — Other Ambulatory Visit: Payer: Self-pay | Admitting: Family

## 2022-06-12 DIAGNOSIS — N401 Enlarged prostate with lower urinary tract symptoms: Secondary | ICD-10-CM

## 2022-07-23 DIAGNOSIS — H0102B Squamous blepharitis left eye, upper and lower eyelids: Secondary | ICD-10-CM | POA: Diagnosis not present

## 2022-07-23 DIAGNOSIS — H10412 Chronic giant papillary conjunctivitis, left eye: Secondary | ICD-10-CM | POA: Diagnosis not present

## 2022-07-23 DIAGNOSIS — E113292 Type 2 diabetes mellitus with mild nonproliferative diabetic retinopathy without macular edema, left eye: Secondary | ICD-10-CM | POA: Diagnosis not present

## 2022-07-23 DIAGNOSIS — H2512 Age-related nuclear cataract, left eye: Secondary | ICD-10-CM | POA: Diagnosis not present

## 2022-07-23 DIAGNOSIS — H4089 Other specified glaucoma: Secondary | ICD-10-CM | POA: Diagnosis not present

## 2022-07-23 DIAGNOSIS — H0102A Squamous blepharitis right eye, upper and lower eyelids: Secondary | ICD-10-CM | POA: Diagnosis not present

## 2022-07-23 DIAGNOSIS — H2701 Aphakia, right eye: Secondary | ICD-10-CM | POA: Diagnosis not present

## 2022-07-23 LAB — HM DIABETES EYE EXAM

## 2022-08-06 DIAGNOSIS — M79676 Pain in unspecified toe(s): Secondary | ICD-10-CM | POA: Diagnosis not present

## 2022-08-06 DIAGNOSIS — E1142 Type 2 diabetes mellitus with diabetic polyneuropathy: Secondary | ICD-10-CM | POA: Diagnosis not present

## 2022-08-06 DIAGNOSIS — B351 Tinea unguium: Secondary | ICD-10-CM | POA: Diagnosis not present

## 2022-08-06 DIAGNOSIS — L84 Corns and callosities: Secondary | ICD-10-CM | POA: Diagnosis not present

## 2022-08-07 ENCOUNTER — Ambulatory Visit: Payer: Self-pay | Admitting: *Deleted

## 2022-08-07 NOTE — Patient Instructions (Signed)
[name]  At some point during the past 4 years, I have worked with you through the [place] Management Program at [place].  Due to program changes I am removing myself from your care team.   If you are currently active with another CCM Team Member, you will remain active with them unless they reach out to you with additional information.   If you feel that you need services in the future,  please talk with your primary care provider and request a new referral for Care Management or Care Coordination services. This does not affect your status as a patient at [facility].   Thank you for allowing me to participate in your your healthcare journey.  [name], BSN, RN-BC Proofreader Dial: [phone]

## 2022-08-07 NOTE — Chronic Care Management (AMB) (Signed)
  Chronic Care Management   Note  08/07/2022 Name: [name] MRN: [id number] DOB: [birth year]   Due to changes in the Chronic Care Management program, I am removing myself as the [place] from the Care Team and closing [place]. Patient was not scheduled to be followed by the RN Care Coordination nurse for [facility].   Patient does not have an open Care Plan with another CCM team member.  I will forward this case closure encounter to the other team member(s). Patient does not have a current CCM referral placed since 04/29/22. CCM enrollment status changed to "not enrolled".   Patient's PCP can place a new referral if the they needs Care Management or Care Coordination services in the future.  [name], BSN, RN-BC Proofreader Dial: [phone]

## 2022-09-06 ENCOUNTER — Other Ambulatory Visit: Payer: Self-pay | Admitting: Family

## 2022-09-06 DIAGNOSIS — M545 Low back pain, unspecified: Secondary | ICD-10-CM

## 2022-09-10 ENCOUNTER — Ambulatory Visit (INDEPENDENT_AMBULATORY_CARE_PROVIDER_SITE_OTHER): Payer: Medicare Other | Admitting: Family

## 2022-09-10 ENCOUNTER — Encounter: Payer: Self-pay | Admitting: Family

## 2022-09-10 VITALS — BP 152/65 | HR 88 | Ht 70.0 in | Wt 225.0 lb

## 2022-09-10 DIAGNOSIS — E559 Vitamin D deficiency, unspecified: Secondary | ICD-10-CM | POA: Diagnosis not present

## 2022-09-10 DIAGNOSIS — K219 Gastro-esophageal reflux disease without esophagitis: Secondary | ICD-10-CM | POA: Diagnosis not present

## 2022-09-10 DIAGNOSIS — E119 Type 2 diabetes mellitus without complications: Secondary | ICD-10-CM | POA: Diagnosis not present

## 2022-09-10 DIAGNOSIS — Z0001 Encounter for general adult medical examination with abnormal findings: Secondary | ICD-10-CM

## 2022-09-10 DIAGNOSIS — Z79899 Other long term (current) drug therapy: Secondary | ICD-10-CM | POA: Diagnosis not present

## 2022-09-10 DIAGNOSIS — I10 Essential (primary) hypertension: Secondary | ICD-10-CM

## 2022-09-10 DIAGNOSIS — N401 Enlarged prostate with lower urinary tract symptoms: Secondary | ICD-10-CM

## 2022-09-10 DIAGNOSIS — R351 Nocturia: Secondary | ICD-10-CM

## 2022-09-10 DIAGNOSIS — F1320 Sedative, hypnotic or anxiolytic dependence, uncomplicated: Secondary | ICD-10-CM

## 2022-09-10 DIAGNOSIS — M159 Polyosteoarthritis, unspecified: Secondary | ICD-10-CM

## 2022-09-10 DIAGNOSIS — Z0000 Encounter for general adult medical examination without abnormal findings: Secondary | ICD-10-CM | POA: Diagnosis not present

## 2022-09-10 DIAGNOSIS — M545 Low back pain, unspecified: Secondary | ICD-10-CM | POA: Diagnosis not present

## 2022-09-10 DIAGNOSIS — I739 Peripheral vascular disease, unspecified: Secondary | ICD-10-CM

## 2022-09-10 DIAGNOSIS — G8929 Other chronic pain: Secondary | ICD-10-CM

## 2022-09-10 DIAGNOSIS — F419 Anxiety disorder, unspecified: Secondary | ICD-10-CM

## 2022-09-10 DIAGNOSIS — Z794 Long term (current) use of insulin: Secondary | ICD-10-CM

## 2022-09-10 LAB — BAYER DCA HB A1C WAIVED: HB A1C (BAYER DCA - WAIVED): 7.5 % — ABNORMAL HIGH (ref 4.8–5.6)

## 2022-09-10 MED ORDER — DIAZEPAM 5 MG PO TABS: ORAL_TABLET | ORAL | 4 refills | Status: DC

## 2022-09-10 NOTE — Patient Instructions (Signed)
Health Maintenance After Age 86 After age 86, you are at a higher risk for certain long-term diseases and infections as well as injuries from falls. Falls are a major cause of broken bones and head injuries in people who are older than age 86. Getting regular preventive care can help to keep you healthy and well. Preventive care includes getting regular testing and making lifestyle changes as recommended by your health care provider. Talk with your health care provider about: Which screenings and tests you should have. A screening is a test that checks for a disease when you have no symptoms. A diet and exercise plan that is right for you. What should I know about screenings and tests to prevent falls? Screening and testing are the best ways to find a health problem early. Early diagnosis and treatment give you the best chance of managing medical conditions that are common after age 86. Certain conditions and lifestyle choices may make you more likely to have a fall. Your health care provider may recommend: Regular vision checks. Poor vision and conditions such as cataracts can make you more likely to have a fall. If you wear glasses, make sure to get your prescription updated if your vision changes. Medicine review. Work with your health care provider to regularly review all of the medicines you are taking, including over-the-counter medicines. Ask your health care provider about any side effects that may make you more likely to have a fall. Tell your health care provider if any medicines that you take make you feel dizzy or sleepy. Strength and balance checks. Your health care provider may recommend certain tests to check your strength and balance while standing, walking, or changing positions. Foot health exam. Foot pain and numbness, as well as not wearing proper footwear, can make you more likely to have a fall. Screenings, including: Osteoporosis screening. Osteoporosis is a condition that causes  the bones to get weaker and break more easily. Blood pressure screening. Blood pressure changes and medicines to control blood pressure can make you feel dizzy. Depression screening. You may be more likely to have a fall if you have a fear of falling, feel depressed, or feel unable to do activities that you used to do. Alcohol use screening. Using too much alcohol can affect your balance and may make you more likely to have a fall. Follow these instructions at home: Lifestyle Do not drink alcohol if: Your health care provider tells you not to drink. If you drink alcohol: Limit how much you have to: 0-1 drink a day for women. 0-2 drinks a day for men. Know how much alcohol is in your drink. In the [place] one drink equals one 12 oz bottle of beer (355 mL), one 5 oz glass of wine (148 mL), or one 1 oz glass of hard liquor (44 mL). Do not use any products that contain nicotine or tobacco. These products include cigarettes, chewing tobacco, and vaping devices, such as e-cigarettes. If you need help quitting, ask your health care provider. Activity  Follow a regular exercise program to stay fit. This will help you maintain your balance. Ask your health care provider what types of exercise are appropriate for you. If you need a cane or walker, use it as recommended by your health care provider. Wear supportive shoes that have nonskid soles. Safety  Remove any tripping hazards, such as rugs, cords, and clutter. Install safety equipment such as grab bars in bathrooms and safety rails on stairs. Keep rooms and walkways   well-lit. General instructions Talk with your health care provider about your risks for falling. Tell your health care provider if: You fall. Be sure to tell your health care provider about all falls, even ones that seem minor. You feel dizzy, tiredness (fatigue), or off-balance. Take over-the-counter and prescription medicines only as told by your health care provider. These include  supplements. Eat a healthy diet and maintain a healthy weight. A healthy diet includes low-fat dairy products, low-fat (lean) meats, and fiber from whole grains, beans, and lots of fruits and vegetables. Stay current with your vaccines. Schedule regular health, dental, and eye exams. Summary Having a healthy lifestyle and getting preventive care can help to protect your health and wellness after age 86. Screening and testing are the best way to find a health problem early and help you avoid having a fall. Early diagnosis and treatment give you the best chance for managing medical conditions that are more common for people who are older than age 86. Falls are a major cause of broken bones and head injuries in people who are older than age 86. Take precautions to prevent a fall at home. Work with your health care provider to learn what changes you can make to improve your health and wellness and to prevent falls. This information is not intended to replace advice given to you by your health care provider. Make sure you discuss any questions you have with your health care provider. Document Revised: 05/07/2021 Document Reviewed: 05/07/2021 Elsevier Patient Education  2023 [employer] Inc.  
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
## 2022-09-11 ENCOUNTER — Telehealth: Payer: Self-pay | Admitting: Family

## 2022-09-11 LAB — CBC WITH DIFFERENTIAL/PLATELET
Basophils Absolute: 0 10*3/uL (ref 0.0–0.2)
Basos: 1 %
EOS (ABSOLUTE): 0.2 10*3/uL (ref 0.0–0.4)
Eos: 3 %
Hematocrit: 41.7 % (ref 37.5–51.0)
Hemoglobin: 13.7 g/dL (ref 13.0–17.7)
Immature Grans (Abs): 0 10*3/uL (ref 0.0–0.1)
Immature Granulocytes: 0 %
Lymphocytes Absolute: 1.7 10*3/uL (ref 0.7–3.1)
Lymphs: 29 %
MCH: 28.6 pg (ref 26.6–33.0)
MCHC: 32.9 g/dL (ref 31.5–35.7)
MCV: 87 fL (ref 79–97)
Monocytes Absolute: 0.5 10*3/uL (ref 0.1–0.9)
Monocytes: 8 %
Neutrophils Absolute: 3.6 10*3/uL (ref 1.4–7.0)
Neutrophils: 59 %
Platelets: 294 10*3/uL (ref 150–450)
RBC: 4.79 x10E6/uL (ref 4.14–5.80)
RDW: 14.8 % (ref 11.6–15.4)
WBC: 6.1 10*3/uL (ref 3.4–10.8)

## 2022-09-11 LAB — LIPID PANEL
Chol/HDL Ratio: 4 ratio (ref 0.0–5.0)
Cholesterol, Total: 201 mg/dL — ABNORMAL HIGH (ref 100–199)
HDL: 50 mg/dL (ref >39)
LDL Chol Calc (NIH): 105 mg/dL — ABNORMAL HIGH (ref 0–99)
Triglycerides: 269 mg/dL — ABNORMAL HIGH (ref 0–149)
VLDL Cholesterol Cal: 46 mg/dL — ABNORMAL HIGH (ref 5–40)

## 2022-09-11 LAB — CMP14+EGFR
ALT: 30 IU/L (ref 0–44)
AST: 30 IU/L (ref 0–40)
Albumin/Globulin Ratio: 2 (ref 1.2–2.2)
Albumin: 4.9 g/dL — ABNORMAL HIGH (ref 3.7–4.7)
Alkaline Phosphatase: 87 IU/L (ref 44–121)
BUN/Creatinine Ratio: 11 (ref 10–24)
BUN: 11 mg/dL (ref 8–27)
Bilirubin Total: 0.3 mg/dL (ref 0.0–1.2)
CO2: 18 mmol/L — ABNORMAL LOW (ref 20–29)
Calcium: 10.3 mg/dL — ABNORMAL HIGH (ref 8.6–10.2)
Chloride: 98 mmol/L (ref 96–106)
Creatinine, Ser: 0.99 mg/dL (ref 0.76–1.27)
Globulin, Total: 2.5 g/dL (ref 1.5–4.5)
Glucose: 241 mg/dL — ABNORMAL HIGH (ref 70–99)
Potassium: 4.1 mmol/L (ref 3.5–5.2)
Sodium: 136 mmol/L (ref 134–144)
Total Protein: 7.4 g/dL (ref 6.0–8.5)
eGFR: 74 mL/min/{1.73_m2} (ref >59)

## 2022-09-11 LAB — TSH: TSH: 1.67 u[IU]/mL (ref 0.450–4.500)

## 2022-09-11 NOTE — Telephone Encounter (Signed)
Please call patient with lab results.  Also, pt said he is almost out of his Tujeo and needs sample or something else similar that he can take before he runs out.   Please advise and call patient.

## 2022-10-18 ENCOUNTER — Other Ambulatory Visit: Payer: Self-pay | Admitting: Family

## 2022-10-18 DIAGNOSIS — M545 Low back pain, unspecified: Secondary | ICD-10-CM

## 2022-10-25 ENCOUNTER — Ambulatory Visit (INDEPENDENT_AMBULATORY_CARE_PROVIDER_SITE_OTHER): Payer: Medicare Other

## 2022-10-25 DIAGNOSIS — Z23 Encounter for immunization: Secondary | ICD-10-CM

## 2022-10-26 ENCOUNTER — Other Ambulatory Visit: Payer: Self-pay | Admitting: Family

## 2022-10-26 DIAGNOSIS — E559 Vitamin D deficiency, unspecified: Secondary | ICD-10-CM

## 2022-10-27 ENCOUNTER — Other Ambulatory Visit: Payer: Self-pay | Admitting: Family

## 2022-10-27 DIAGNOSIS — I152 Hypertension secondary to endocrine disorders: Secondary | ICD-10-CM

## 2022-11-03 ENCOUNTER — Other Ambulatory Visit: Payer: Self-pay | Admitting: Family

## 2022-11-03 DIAGNOSIS — N401 Enlarged prostate with lower urinary tract symptoms: Secondary | ICD-10-CM

## 2022-11-03 DIAGNOSIS — F419 Anxiety disorder, unspecified: Secondary | ICD-10-CM

## 2022-11-03 DIAGNOSIS — M545 Low back pain, unspecified: Secondary | ICD-10-CM

## 2022-11-03 DIAGNOSIS — I10 Essential (primary) hypertension: Secondary | ICD-10-CM

## 2022-11-03 DIAGNOSIS — E119 Type 2 diabetes mellitus without complications: Secondary | ICD-10-CM

## 2022-11-11 ENCOUNTER — Other Ambulatory Visit: Payer: Self-pay | Admitting: Family

## 2022-11-11 DIAGNOSIS — E119 Type 2 diabetes mellitus without complications: Secondary | ICD-10-CM

## 2022-11-13 ENCOUNTER — Other Ambulatory Visit: Payer: Self-pay | Admitting: Family

## 2022-11-13 DIAGNOSIS — N401 Enlarged prostate with lower urinary tract symptoms: Secondary | ICD-10-CM

## 2022-12-03 DIAGNOSIS — B351 Tinea unguium: Secondary | ICD-10-CM | POA: Diagnosis not present

## 2022-12-03 DIAGNOSIS — L84 Corns and callosities: Secondary | ICD-10-CM | POA: Diagnosis not present

## 2022-12-03 DIAGNOSIS — M79676 Pain in unspecified toe(s): Secondary | ICD-10-CM | POA: Diagnosis not present

## 2022-12-03 DIAGNOSIS — E1142 Type 2 diabetes mellitus with diabetic polyneuropathy: Secondary | ICD-10-CM | POA: Diagnosis not present

## 2022-12-06 ENCOUNTER — Other Ambulatory Visit: Payer: Self-pay | Admitting: Family

## 2022-12-06 DIAGNOSIS — M545 Low back pain, unspecified: Secondary | ICD-10-CM

## 2022-12-13 ENCOUNTER — Other Ambulatory Visit: Payer: Self-pay | Admitting: Family

## 2022-12-13 DIAGNOSIS — G8929 Other chronic pain: Secondary | ICD-10-CM

## 2022-12-14 NOTE — Telephone Encounter (Signed)
Last office visit 09/10/22 Last refill 05/16/22, #[id number], 1 refill

## 2023-01-06 ENCOUNTER — Ambulatory Visit: Payer: Medicare Other | Admitting: Family Medicine

## 2023-01-07 ENCOUNTER — Encounter: Payer: Self-pay | Admitting: Family

## 2023-01-13 ENCOUNTER — Encounter: Payer: Self-pay | Admitting: Family

## 2023-01-13 ENCOUNTER — Ambulatory Visit (INDEPENDENT_AMBULATORY_CARE_PROVIDER_SITE_OTHER): Payer: Medicare Other

## 2023-01-13 ENCOUNTER — Ambulatory Visit (INDEPENDENT_AMBULATORY_CARE_PROVIDER_SITE_OTHER): Payer: Medicare Other | Admitting: Family

## 2023-01-13 VITALS — BP 166/85 | HR 117 | Temp 97.9°F | Ht 70.0 in | Wt 233.0 lb

## 2023-01-13 DIAGNOSIS — E119 Type 2 diabetes mellitus without complications: Secondary | ICD-10-CM

## 2023-01-13 DIAGNOSIS — M159 Polyosteoarthritis, unspecified: Secondary | ICD-10-CM | POA: Diagnosis not present

## 2023-01-13 DIAGNOSIS — F419 Anxiety disorder, unspecified: Secondary | ICD-10-CM | POA: Diagnosis not present

## 2023-01-13 DIAGNOSIS — E559 Vitamin D deficiency, unspecified: Secondary | ICD-10-CM

## 2023-01-13 DIAGNOSIS — M25552 Pain in left hip: Secondary | ICD-10-CM

## 2023-01-13 DIAGNOSIS — Z794 Long term (current) use of insulin: Secondary | ICD-10-CM

## 2023-01-13 DIAGNOSIS — M545 Low back pain, unspecified: Secondary | ICD-10-CM

## 2023-01-13 DIAGNOSIS — N401 Enlarged prostate with lower urinary tract symptoms: Secondary | ICD-10-CM

## 2023-01-13 DIAGNOSIS — I10 Essential (primary) hypertension: Secondary | ICD-10-CM

## 2023-01-13 DIAGNOSIS — R351 Nocturia: Secondary | ICD-10-CM

## 2023-01-13 DIAGNOSIS — R600 Localized edema: Secondary | ICD-10-CM | POA: Diagnosis not present

## 2023-01-13 DIAGNOSIS — H6123 Impacted cerumen, bilateral: Secondary | ICD-10-CM | POA: Diagnosis not present

## 2023-01-13 DIAGNOSIS — K219 Gastro-esophageal reflux disease without esophagitis: Secondary | ICD-10-CM

## 2023-01-13 DIAGNOSIS — M1612 Unilateral primary osteoarthritis, left hip: Secondary | ICD-10-CM | POA: Diagnosis not present

## 2023-01-13 DIAGNOSIS — F1320 Sedative, hypnotic or anxiolytic dependence, uncomplicated: Secondary | ICD-10-CM

## 2023-01-13 DIAGNOSIS — G8929 Other chronic pain: Secondary | ICD-10-CM | POA: Diagnosis not present

## 2023-01-13 DIAGNOSIS — Z79899 Other long term (current) drug therapy: Secondary | ICD-10-CM

## 2023-01-13 MED ORDER — DIAZEPAM 5 MG PO TABS: ORAL_TABLET | ORAL | 4 refills | Status: DC

## 2023-01-13 NOTE — Patient Instructions (Signed)
Hip Pain The hip is the joint between the upper legs and the lower pelvis. The bones, cartilage, tendons, and muscles of your hip joint support your body and allow you to move around. Hip pain can range from a minor ache to severe pain in one or both of your hips. The pain may be felt on the inside of the hip joint near the groin, or on the outside near the buttocks and upper thigh. You may also have swelling or stiffness in your hip area. Follow these instructions at home: Managing pain, stiffness, and swelling     If directed, put ice on the painful area. To do this: Put ice in a plastic bag. Place a towel between your skin and the bag. Leave the ice on for 20 minutes, 2-3 times a day. If directed, apply heat to the affected area as often as told by your health care provider. Use the heat source that your health care provider recommends, such as a moist heat pack or a heating pad. Place a towel between your skin and the heat source. Leave the heat on for 20-30 minutes. Remove the heat if your skin turns bright red. This is especially important if you are unable to feel pain, heat, or cold. You may have a greater risk of getting burned. Activity Do exercises as told by your health care provider. Avoid activities that cause pain. General instructions  Take over-the-counter and prescription medicines only as told by your health care provider. Keep a journal of your symptoms. Write down: How often you have hip pain. The location of your pain. What the pain feels like. What makes the pain worse. Sleep with a pillow between your legs on your most comfortable side. Keep all follow-up visits as told by your health care provider. This is important. Contact a health care provider if: You cannot put weight on your leg. Your pain or swelling continues or gets worse after one week. It gets harder to walk. You have a fever. Get help right away if: You fall. You have a sudden increase in pain  and swelling in your hip. Your hip is red or swollen or very tender to touch. Summary Hip pain can range from a minor ache to severe pain in one or both of your hips. The pain may be felt on the inside of the hip joint near the groin, or on the outside near the buttocks and upper thigh. Avoid activities that cause pain. Write down how often you have hip pain, the location of the pain, what makes it worse, and what it feels like. This information is not intended to replace advice given to you by your health care provider. Make sure you discuss any questions you have with your health care provider. Document Revised: 05/03/2019 Document Reviewed: 05/03/2019 Elsevier Patient Education  2023 Elsevier Inc.  

## 2023-01-13 NOTE — Addendum Note (Signed)
Addended by: Ladean Raya on: 01/13/2023 02:42 PM   Modules accepted: Orders
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
## 2023-01-14 ENCOUNTER — Other Ambulatory Visit: Payer: Self-pay | Admitting: Family

## 2023-01-14 ENCOUNTER — Telehealth: Payer: Self-pay

## 2023-01-14 DIAGNOSIS — E119 Type 2 diabetes mellitus without complications: Secondary | ICD-10-CM

## 2023-01-14 NOTE — Telephone Encounter (Signed)
Pt called back and advised pt of results

## 2023-01-15 LAB — TOXASSURE SELECT 13 (MW), URINE

## 2023-01-16 ENCOUNTER — Telehealth: Payer: Self-pay | Admitting: Family

## 2023-01-16 NOTE — Telephone Encounter (Signed)
NVM box set up

## 2023-01-21 NOTE — Telephone Encounter (Signed)
NVM set up

## 2023-01-21 NOTE — Telephone Encounter (Signed)
Patient aware and verbalized understanding.

## 2023-02-03 ENCOUNTER — Other Ambulatory Visit: Payer: Self-pay | Admitting: Family

## 2023-02-04 ENCOUNTER — Other Ambulatory Visit: Payer: Self-pay | Admitting: Family

## 2023-02-04 DIAGNOSIS — E1159 Type 2 diabetes mellitus with other circulatory complications: Secondary | ICD-10-CM

## 2023-02-04 DIAGNOSIS — R609 Edema, unspecified: Secondary | ICD-10-CM

## 2023-02-05 ENCOUNTER — Telehealth: Payer: Self-pay | Admitting: Family

## 2023-02-17 ENCOUNTER — Telehealth: Payer: Self-pay | Admitting: Family

## 2023-02-17 NOTE — Telephone Encounter (Signed)
Patient aware no samples  

## 2023-02-19 NOTE — Telephone Encounter (Signed)
No samples patient aware. 

## 2023-02-19 NOTE — Telephone Encounter (Signed)
Patient called wanting to know if we have received any Toujeo samples yet?

## 2023-02-24 NOTE — Telephone Encounter (Signed)
Pt aware we don't have any Toujeo samples.

## 2023-02-24 NOTE — Telephone Encounter (Signed)
Do we have toujeo samples please call back

## 2023-02-26 ENCOUNTER — Telehealth: Payer: Self-pay | Admitting: Family

## 2023-02-26 DIAGNOSIS — E119 Type 2 diabetes mellitus without complications: Secondary | ICD-10-CM

## 2023-02-26 NOTE — Telephone Encounter (Signed)
Patient aware we do not have samples is there anything that would compare to it he can get samples of. Please advise.

## 2023-02-27 NOTE — Telephone Encounter (Signed)
REFERRAL PLACED

## 2023-03-04 DIAGNOSIS — M79676 Pain in unspecified toe(s): Secondary | ICD-10-CM | POA: Diagnosis not present

## 2023-03-04 DIAGNOSIS — L84 Corns and callosities: Secondary | ICD-10-CM | POA: Diagnosis not present

## 2023-03-04 DIAGNOSIS — E1142 Type 2 diabetes mellitus with diabetic polyneuropathy: Secondary | ICD-10-CM | POA: Diagnosis not present

## 2023-03-04 DIAGNOSIS — B351 Tinea unguium: Secondary | ICD-10-CM | POA: Diagnosis not present

## 2023-03-06 ENCOUNTER — Telehealth: Payer: Self-pay | Admitting: Family

## 2023-03-06 DIAGNOSIS — E119 Type 2 diabetes mellitus without complications: Secondary | ICD-10-CM

## 2023-03-06 NOTE — Telephone Encounter (Signed)
Patient aware and verbalized understanding. °

## 2023-03-06 NOTE — Telephone Encounter (Signed)
Called and spoke with patient BS this morning 205. Can't not afford insulin referral put in. FYI please advise

## 2023-03-06 NOTE — Telephone Encounter (Signed)
Please see if we have Toujeo or [place]  samples to give patient until he can get patient assistance?

## 2023-03-07 ENCOUNTER — Telehealth: Payer: Self-pay | Admitting: Pharmacist

## 2023-03-07 NOTE — Telephone Encounter (Signed)
    03/07/2023 Name: [name] MRN: [id number] DOB: 08/20/1933   Request for medication assistance (insulin).  Application prepared and faxed for Tresiba insulin (10-20 units daily). Patient's A1c 7.5%.  Blood pressure control needed.  Will schedule patient to see PharmD for BP in clinic.   Regina Eck, PharmD, [place] Clinical Pharmacist, [facility]

## 2023-03-09 ENCOUNTER — Other Ambulatory Visit: Payer: Self-pay | Admitting: Family

## 2023-03-09 DIAGNOSIS — R609 Edema, unspecified: Secondary | ICD-10-CM

## 2023-03-09 DIAGNOSIS — M545 Low back pain, unspecified: Secondary | ICD-10-CM

## 2023-03-09 DIAGNOSIS — I152 Hypertension secondary to endocrine disorders: Secondary | ICD-10-CM

## 2023-03-11 ENCOUNTER — Encounter: Payer: Self-pay | Admitting: Family

## 2023-03-11 ENCOUNTER — Ambulatory Visit (INDEPENDENT_AMBULATORY_CARE_PROVIDER_SITE_OTHER): Payer: Medicare Other | Admitting: Family

## 2023-03-11 ENCOUNTER — Telehealth: Payer: Self-pay | Admitting: Family

## 2023-03-11 VITALS — BP 145/76 | HR 93 | Temp 98.7°F | Ht 70.0 in | Wt 230.8 lb

## 2023-03-11 DIAGNOSIS — M545 Low back pain, unspecified: Secondary | ICD-10-CM

## 2023-03-11 DIAGNOSIS — Z79899 Other long term (current) drug therapy: Secondary | ICD-10-CM

## 2023-03-11 DIAGNOSIS — F1320 Sedative, hypnotic or anxiolytic dependence, uncomplicated: Secondary | ICD-10-CM

## 2023-03-11 DIAGNOSIS — G8929 Other chronic pain: Secondary | ICD-10-CM

## 2023-03-11 DIAGNOSIS — I10 Essential (primary) hypertension: Secondary | ICD-10-CM | POA: Diagnosis not present

## 2023-03-11 DIAGNOSIS — F419 Anxiety disorder, unspecified: Secondary | ICD-10-CM | POA: Diagnosis not present

## 2023-03-11 DIAGNOSIS — E119 Type 2 diabetes mellitus without complications: Secondary | ICD-10-CM

## 2023-03-11 DIAGNOSIS — I739 Peripheral vascular disease, unspecified: Secondary | ICD-10-CM | POA: Diagnosis not present

## 2023-03-11 DIAGNOSIS — R351 Nocturia: Secondary | ICD-10-CM

## 2023-03-11 DIAGNOSIS — M159 Polyosteoarthritis, unspecified: Secondary | ICD-10-CM

## 2023-03-11 DIAGNOSIS — N3281 Overactive bladder: Secondary | ICD-10-CM

## 2023-03-11 DIAGNOSIS — K219 Gastro-esophageal reflux disease without esophagitis: Secondary | ICD-10-CM

## 2023-03-11 DIAGNOSIS — Z794 Long term (current) use of insulin: Secondary | ICD-10-CM

## 2023-03-11 DIAGNOSIS — N401 Enlarged prostate with lower urinary tract symptoms: Secondary | ICD-10-CM

## 2023-03-11 LAB — CMP14+EGFR
ALT: 38 IU/L (ref 0–44)
AST: 33 IU/L (ref 0–40)
Albumin/Globulin Ratio: 1.8 (ref 1.2–2.2)
Albumin: 5.1 g/dL — ABNORMAL HIGH (ref 3.7–4.7)
Alkaline Phosphatase: 102 IU/L (ref 44–121)
BUN/Creatinine Ratio: 11 (ref 10–24)
BUN: 10 mg/dL (ref 8–27)
Bilirubin Total: 0.3 mg/dL (ref 0.0–1.2)
CO2: 19 mmol/L — ABNORMAL LOW (ref 20–29)
Calcium: 10.4 mg/dL — ABNORMAL HIGH (ref 8.6–10.2)
Chloride: 97 mmol/L (ref 96–106)
Creatinine, Ser: 0.91 mg/dL (ref 0.76–1.27)
Globulin, Total: 2.8 g/dL (ref 1.5–4.5)
Glucose: 182 mg/dL — ABNORMAL HIGH (ref 70–99)
Potassium: 4.5 mmol/L (ref 3.5–5.2)
Sodium: 136 mmol/L (ref 134–144)
Total Protein: 7.9 g/dL (ref 6.0–8.5)
eGFR: 82 mL/min/{1.73_m2} (ref >59)

## 2023-03-11 LAB — CBC WITH DIFFERENTIAL/PLATELET
Basophils Absolute: 0 10*3/uL (ref 0.0–0.2)
Basos: 1 %
EOS (ABSOLUTE): 0.1 10*3/uL (ref 0.0–0.4)
Eos: 2 %
Hematocrit: 42.2 % (ref 37.5–51.0)
Hemoglobin: 14.5 g/dL (ref 13.0–17.7)
Immature Grans (Abs): 0 10*3/uL (ref 0.0–0.1)
Immature Granulocytes: 0 %
Lymphocytes Absolute: 1.9 10*3/uL (ref 0.7–3.1)
Lymphs: 29 %
MCH: 29.4 pg (ref 26.6–33.0)
MCHC: 34.4 g/dL (ref 31.5–35.7)
MCV: 86 fL (ref 79–97)
Monocytes Absolute: 0.5 10*3/uL (ref 0.1–0.9)
Monocytes: 7 %
Neutrophils Absolute: 4 10*3/uL (ref 1.4–7.0)
Neutrophils: 61 %
Platelets: 304 10*3/uL (ref 150–450)
RBC: 4.93 x10E6/uL (ref 4.14–5.80)
RDW: 12.9 % (ref 11.6–15.4)
WBC: 6.5 10*3/uL (ref 3.4–10.8)

## 2023-03-11 LAB — BAYER DCA HB A1C WAIVED: HB A1C (BAYER DCA - WAIVED): 7.7 % — ABNORMAL HIGH (ref 4.8–5.6)

## 2023-03-11 MED ORDER — TRAMADOL HCL 50 MG PO TABS: 50.0000 mg | ORAL_TABLET | Freq: Three times a day (TID) | ORAL | 2 refills | Status: DC | PRN

## 2023-03-11 MED ORDER — OXYBUTYNIN CHLORIDE ER 5 MG PO TB24: 5.0000 mg | ORAL_TABLET | Freq: Every day | ORAL | 1 refills | Status: DC

## 2023-03-11 NOTE — Patient Instructions (Signed)
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
## 2023-03-11 NOTE — Telephone Encounter (Signed)
Called patient back he is aware not to take diazepam and tramadol together

## 2023-03-14 NOTE — Telephone Encounter (Signed)
Patient aware and verbalized understanding.  Patient does not want to go on a referral.

## 2023-03-14 NOTE — Telephone Encounter (Signed)
Pt says that he cannot take tramadol at all. Can Christy prescribe something else? Use [facility]

## 2023-03-14 NOTE — Telephone Encounter (Signed)
Unfortunately there is nothing else I can prescribe. I can place referral to Ortho.

## 2023-03-20 ENCOUNTER — Encounter: Payer: Self-pay | Admitting: Family Medicine

## 2023-03-20 ENCOUNTER — Ambulatory Visit (INDEPENDENT_AMBULATORY_CARE_PROVIDER_SITE_OTHER): Payer: Medicare Other | Admitting: Family Medicine

## 2023-03-20 VITALS — BP 187/84 | HR 107 | Temp 98.3°F | Ht 70.0 in | Wt 230.4 lb

## 2023-03-20 DIAGNOSIS — G8929 Other chronic pain: Secondary | ICD-10-CM

## 2023-03-20 DIAGNOSIS — M25552 Pain in left hip: Secondary | ICD-10-CM

## 2023-03-20 MED ORDER — METHYLPREDNISOLONE ACETATE 40 MG/ML IJ SUSP
40.0000 mg | Freq: Once | INTRAMUSCULAR | Status: AC
  Administered 2023-03-20: 60 mg via INTRAMUSCULAR
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
## 2023-03-21 ENCOUNTER — Telehealth: Payer: Self-pay | Admitting: Family

## 2023-03-21 ENCOUNTER — Telehealth: Payer: Medicare Other

## 2023-03-26 ENCOUNTER — Encounter: Payer: Self-pay | Admitting: Pharmacist

## 2023-03-26 ENCOUNTER — Telehealth (INDEPENDENT_AMBULATORY_CARE_PROVIDER_SITE_OTHER): Payer: Medicare Other | Admitting: Pharmacist

## 2023-03-26 ENCOUNTER — Telehealth: Payer: Self-pay | Admitting: Pharmacist

## 2023-03-26 ENCOUNTER — Telehealth: Payer: Self-pay | Admitting: Family

## 2023-03-26 DIAGNOSIS — E119 Type 2 diabetes mellitus without complications: Secondary | ICD-10-CM

## 2023-03-26 DIAGNOSIS — Z794 Long term (current) use of insulin: Secondary | ICD-10-CM

## 2023-03-26 NOTE — Telephone Encounter (Signed)
Seen rakes pcp off today should he be seen again?

## 2023-03-26 NOTE — Telephone Encounter (Signed)
Please call patient to reschedule his missed CCM appt he was supposed to have on 3/22 with Almyra Free.

## 2023-03-26 NOTE — Telephone Encounter (Signed)
Appt made he wanted it with anyone put on dod

## 2023-03-26 NOTE — Telephone Encounter (Signed)
Please email me PAP for tresiba U200--sig 20 units into skin nightly #300 ct pen needles  Thank you!

## 2023-03-26 NOTE — Telephone Encounter (Signed)
    03/26/2023 Name: [name] MRN: [id number] DOB: September 18, 1933   S:  89 yoMPresents for diabetes evaluation, education, and management Patient was referred and last seen by Primary Care Provider on 04/07/23.  Insurance coverage/medication affordability: [facility] MEDICARE  Patient MOSTLY adherent with medications. Current diabetes medications include: Toujeo/tresiba 10 units qhs, metformin 500mg  TID, insulin lispro PRN (not taking) Current hypertension medications include: amlodipine, losartan Current hyperlipidemia medications include: atorvastatin (not currently taking) -last LDL was 105--patient refuses Patient denies hypoglycemic events.   Patient-reported exercise habits: encouraged as able  O:  Lab Results  Component Value Date   HGBA1C 7.7 (H) 03/11/2023    Lipid Panel     Component Value Date/Time   CHOL 201 (H) 09/10/2022 1032   CHOL 116 05/19/2013 1045   TRIG 269 (H) 09/10/2022 1032   TRIG 149 10/13/2013 1252   TRIG 94 05/19/2013 1045   HDL 50 09/10/2022 1032   HDL 49 10/13/2013 1252   HDL 41 05/19/2013 1045   CHOLHDL 4.0 09/10/2022 1032   CHOLHDL 4.5 02/27/2012 0455   VLDL 33 02/27/2012 0455   LDLCALC 105 (H) 09/10/2022 1032   LDLCALC 67 10/13/2013 1252   LDLCALC 56 05/19/2013 1045   LDLDIRECT 89 12/02/2014 1250    Home fasting blood sugars: <180  2 hour post-meal/random blood sugars: up to the low 200s.    Clinical Atherosclerotic Cardiovascular Disease (ASCVD): No   The ASCVD Risk score (Arnett DK, et al., 2019) failed to calculate for the following reasons:   The 2019 ASCVD risk score is only valid for ages 52 to 74    A/P:  -A1c 7.7%--patient refuses additional medication; work on diet/lifestyle -CONTINUE insulin degludec (tresiba) 10-12 units daily and metformin  Application submitted for novo nordisk patient assistance program -refuses statin -BP controlled   Kieth Brightly, PharmD, BCACP Clinical Pharmacist, [facility]

## 2023-03-27 ENCOUNTER — Encounter: Payer: Self-pay | Admitting: Family Medicine

## 2023-03-27 ENCOUNTER — Ambulatory Visit (INDEPENDENT_AMBULATORY_CARE_PROVIDER_SITE_OTHER): Payer: Medicare Other | Admitting: Family Medicine

## 2023-03-27 VITALS — BP 175/73 | HR 99 | Temp 98.7°F | Ht 70.0 in | Wt 225.4 lb

## 2023-03-27 DIAGNOSIS — G8929 Other chronic pain: Secondary | ICD-10-CM | POA: Diagnosis not present

## 2023-03-27 DIAGNOSIS — M545 Low back pain, unspecified: Secondary | ICD-10-CM

## 2023-03-27 MED ORDER — MELOXICAM 7.5 MG PO TABS: 7.5000 mg | ORAL_TABLET | Freq: Every day | ORAL | 0 refills | Status: DC

## 2023-03-27 MED ORDER — BLOOD GLUCOSE MONITOR SYSTEM W/DEVICE KIT: PACK | 0 refills | Status: DC

## 2023-03-27 MED ORDER — BETAMETHASONE SOD PHOS & ACET 6 (3-3) MG/ML IJ SUSP
6.0000 mg | Freq: Once | INTRAMUSCULAR | Status: AC
  Administered 2023-03-27: 6 mg via INTRAMUSCULAR
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
## 2023-03-31 ENCOUNTER — Telehealth: Payer: Self-pay | Admitting: Family

## 2023-03-31 NOTE — Telephone Encounter (Signed)
Patient calling to reschedule appt from 3/22

## 2023-04-07 ENCOUNTER — Encounter: Payer: Self-pay | Admitting: Family Medicine

## 2023-04-07 ENCOUNTER — Ambulatory Visit (INDEPENDENT_AMBULATORY_CARE_PROVIDER_SITE_OTHER): Payer: Medicare Other | Admitting: Family Medicine

## 2023-04-07 VITALS — BP 132/60 | HR 84 | Ht 70.0 in | Wt 222.0 lb

## 2023-04-07 DIAGNOSIS — E1142 Type 2 diabetes mellitus with diabetic polyneuropathy: Secondary | ICD-10-CM

## 2023-04-07 MED ORDER — GABAPENTIN 300 MG PO CAPS: 300.0000 mg | ORAL_CAPSULE | Freq: Two times a day (BID) | ORAL | 2 refills | Status: DC

## 2023-04-07 NOTE — Progress Notes (Signed)
BP 132/60   Pulse 84   Ht 5\' 10"  (1.778 m)   Wt 222 lb (100.7 kg)   SpO2 98%   BMI 31.85 kg/m    Subjective:   Patient ID: [name], male    DOB: 1933-10-21, 89 y.o.   MRN: [id number]  HPI: [name] is a 89 y.o. male presenting on 04/07/2023 for Toe Pain (Bilateral feet)   HPI Patient is coming in complaining of bilateral big toe pain and some into his feet but mainly his big toes it has been bothering him for more than a few months.  It has been worsening.  He does see a podiatrist for some calluses on the tip of his toes but I do not see anything that looks infected today.  He does work with Dr. Darden Dates office regularly and will go back to them.  He says the burning and pain in his toes and his feet but especially in his 2 big toes has just been worsening and has not been controlled.  It looks like they have tried some things like tramadol which she did not tolerate and has been on Cymbalta and gabapentin on lower doses.  Relevant past medical, surgical, family and social history reviewed and updated as indicated. Interim medical history since our last visit reviewed. Allergies and medications reviewed and updated.  Review of Systems  Constitutional:  Negative for chills and fever.  Eyes:  Negative for visual disturbance.  Respiratory:  Negative for shortness of breath and wheezing.   Cardiovascular:  Negative for chest pain and leg swelling.  Musculoskeletal:  Positive for arthralgias. Negative for back pain and gait problem.  Skin:  Negative for rash.  Neurological:  Positive for numbness.  All other systems reviewed and are negative.   Per HPI unless specifically indicated above   Allergies as of 04/07/2023       Reactions   Diclofenac    Flexeril [cyclobenzaprine]    Guaifenesin Er    Believes that it makes him "black out"        Medication List        Accurate as of April 07, 2023 10:36 AM. If you have any questions, ask your nurse or doctor.           Accu-Chek Guide test strip Generic drug: glucose blood TEST BLOOD SUGAR TWICE A DAY DX E11.9   amLODipine 10 MG tablet Commonly known as: NORVASC TAKE 1 TABLET BY MOUTH EVERY DAY   aspirin 81 MG tablet Take 81 mg by mouth daily. Takes EC aspirin   atorvastatin 20 MG tablet Commonly known as: LIPITOR Take 1 tablet (20 mg total) by mouth daily.   baclofen 10 MG tablet Commonly known as: LIORESAL TAKE 1/2 TABLET (5 MG) BY MOUTH 3 TIMES DAILY   BD Pen Needle Nano 2nd Gen 32G X 4 MM Misc Generic drug: Insulin Pen Needle USE TO CHECK SUGAR 4 TIMES DAILY AS NEEDED DX E11.69   blood glucose meter kit and supplies Dispense per insurance preference. Use up to four times daily as directed. E 11.9   Blood Glucose Monitor System w/Device Kit TEST BS BID Dx E11.9   diclofenac Sodium 1 % Gel Commonly known as: Voltaren Apply 2 g topically 4 (four) times daily.   DULoxetine 30 MG capsule Commonly known as: CYMBALTA TAKE 1 CAPSULE BY MOUTH EVERY DAY   finasteride 5 MG tablet Commonly known as: PROSCAR TAKE 1 TABLET (5 MG TOTAL) BY MOUTH AT BEDTIME. FOR  PROSTATE   furosemide 20 MG tablet Commonly known as: LASIX TAKE 1 OR 2 TABLETS (20-40 MG TOTAL) BY MOUTH DAILY.   gabapentin 300 MG capsule Commonly known as: NEURONTIN Take 1 capsule (300 mg total) by mouth 2 (two) times daily. What changed:  medication strength how much to take Changed by: Elige Radon Ryosuke Ericksen, MD   latanoprost 0.005 % ophthalmic solution Commonly known as: XALATAN 1 drop daily.   losartan 100 MG tablet Commonly known as: COZAAR TAKE 1 TABLET BY MOUTH EVERY DAY   meloxicam 7.5 MG tablet Commonly known as: MOBIC Take 1 tablet (7.5 mg total) by mouth daily.   metFORMIN 500 MG 24 hr tablet Commonly known as: GLUCOPHAGE-XR TAKE 1 TABLET BY MOUTH 3 TIMES DAILY WITH MEALS.   OneTouch Delica Lancets 33G Misc USE 2 TIMES A DAY Dx E11.9   oxybutynin 5 MG 24 hr tablet Commonly known as:  DITROPAN-XL Take 1 tablet (5 mg total) by mouth at bedtime.   tamsulosin 0.4 MG Caps capsule Commonly known as: FLOMAX TAKE 2 CAPSULES (0.8 MG TOTAL) BY MOUTH AT BEDTIME. FOR PROSTATE.   Toujeo Max SoloStar 300 UNIT/ML Solostar Pen Generic drug: insulin glargine (2 Unit Dial) Inject 10 Units into the skin at bedtime.   traMADol 50 MG tablet Commonly known as: ULTRAM Take 1 tablet (50 mg total) by mouth every 8 (eight) hours as needed.   Vitamin D (Ergocalciferol) 1.25 MG (50000 UNIT) Caps capsule Commonly known as: DRISDOL TAKE 1 CAPSULE BY MOUTH ON SUNDAY OF EACH WEEK AS DIRECTED         Objective:   BP 132/60   Pulse 84   Ht 5\' 10"  (1.778 m)   Wt 222 lb (100.7 kg)   SpO2 98%   BMI 31.85 kg/m   Wt Readings from Last 3 Encounters:  04/07/23 222 lb (100.7 kg)  03/27/23 225 lb 6.4 oz (102.2 kg)  03/20/23 230 lb 6.4 oz (104.5 kg)    Physical Exam Vitals and nursing note reviewed.  Constitutional:      General: He is not in acute distress.    Appearance: He is well-developed. He is not diaphoretic.  Eyes:     General: No scleral icterus.    Conjunctiva/sclera: Conjunctivae normal.  Neck:     Thyroid: No thyromegaly.  Musculoskeletal:        General: Normal range of motion.     Cervical back: Neck supple.  Lymphadenopathy:     Cervical: No cervical adenopathy.  Skin:    General: Skin is warm and dry.     Findings: No rash.     Comments: Calluses on the end of both of his toes but does not appear to be any erythema or infection.  Toenails have onychomycosis.  Decreased sensation in both great toes.  Neurological:     Mental Status: He is alert and oriented to person, place, and time.     Coordination: Coordination normal.  Psychiatric:        Behavior: Behavior normal.       Assessment & Plan:   Problem List Items Addressed This Visit   None Visit Diagnoses     Diabetic polyneuropathy associated with type 2 diabetes mellitus    -  Primary   Relevant  Medications   gabapentin (NEURONTIN) 300 MG capsule       Will try increasing his gabapentin to 300 mg twice a day and follow-up with PCP Follow up plan: Return if symptoms worsen or fail  to improve.  Counseling provided for all of the vaccine components No orders of the defined types were placed in this encounter.   Arville CareJoshua Amayah Staheli, MD [facility] 04/07/2023, 10:36 AM

## 2023-04-08 ENCOUNTER — Telehealth: Payer: Self-pay | Admitting: Family

## 2023-04-08 DIAGNOSIS — G8929 Other chronic pain: Secondary | ICD-10-CM

## 2023-04-08 DIAGNOSIS — M545 Low back pain, unspecified: Secondary | ICD-10-CM

## 2023-04-08 NOTE — Telephone Encounter (Signed)
Pt called wanting to ask PCP if she would send medication in for him that would help with his arthritis pain because he says the shot he got a few weeks ago did not help and he is in a lot of pain.

## 2023-04-08 NOTE — Telephone Encounter (Signed)
Patient states he will think about it and give [place] a call.

## 2023-04-08 NOTE — Telephone Encounter (Signed)
If voltaren gel, baclofen, ultram, mobic is not helping, then we would need to do a referral to Ortho.

## 2023-04-11 NOTE — Addendum Note (Signed)
Addended by: Ignacia Bayley on: 04/11/2023 09:10 AM   Modules accepted: Orders

## 2023-04-11 NOTE — Telephone Encounter (Signed)
Referral placed.

## 2023-04-11 NOTE — Addendum Note (Signed)
Addended by: Ignacia Bayley on: 04/11/2023 09:08 AM   Modules accepted: Orders

## 2023-04-11 NOTE — Telephone Encounter (Signed)
Pt wants referral sent to back specialist in [place].

## 2023-04-15 ENCOUNTER — Other Ambulatory Visit: Payer: Self-pay | Admitting: Family

## 2023-04-15 DIAGNOSIS — N401 Enlarged prostate with lower urinary tract symptoms: Secondary | ICD-10-CM

## 2023-04-15 DIAGNOSIS — I10 Essential (primary) hypertension: Secondary | ICD-10-CM

## 2023-04-15 DIAGNOSIS — G8929 Other chronic pain: Secondary | ICD-10-CM

## 2023-04-15 DIAGNOSIS — E119 Type 2 diabetes mellitus without complications: Secondary | ICD-10-CM

## 2023-04-15 DIAGNOSIS — F419 Anxiety disorder, unspecified: Secondary | ICD-10-CM

## 2023-04-16 NOTE — Telephone Encounter (Signed)
Submitted application for INSULIN DEGLUDEC U200 to [employer] for patient assistance.   Phone: [phone]

## 2023-04-29 ENCOUNTER — Telehealth: Payer: Self-pay | Admitting: Family

## 2023-04-29 DIAGNOSIS — I739 Peripheral vascular disease, unspecified: Secondary | ICD-10-CM

## 2023-04-29 DIAGNOSIS — E119 Type 2 diabetes mellitus without complications: Secondary | ICD-10-CM

## 2023-04-29 NOTE — Telephone Encounter (Signed)
NO samples at this time, Did we get him on patient assistance

## 2023-04-29 NOTE — Telephone Encounter (Signed)
Referral placed. I thought he was getting this for cheaper. I will forward to Raynelle Fanning to help out.

## 2023-04-30 NOTE — Telephone Encounter (Signed)
Attempted to call patient regarding missing info needed from [employer] (need proof of income). All numbers have no voicemail, neither does daughters number. Will attempt again.

## 2023-05-01 NOTE — Telephone Encounter (Signed)
Samples left up front for patient to pick up Patient assistance still pending

## 2023-05-01 NOTE — Telephone Encounter (Signed)
Attempted all contacts.   Left message with emergency contact Truddie Coco requesting call back regarding patient assistance documentation needed.

## 2023-05-02 ENCOUNTER — Encounter: Payer: Self-pay | Admitting: Pharmacist

## 2023-05-08 ENCOUNTER — Telehealth: Payer: Self-pay

## 2023-05-08 NOTE — Progress Notes (Signed)
  Chronic Care Management   Note  05/08/2023 Name: Ianmichael Orgel MRN: [id number] DOB: 1933-09-08  Dejour Primeau is a 89 y.o. year old male who is a primary care patient of Junie Spencer, FNP. I reached out to [facility] by phone today in response to a referral sent by Mr. Draxler Severson PCP.  The first contact attempt was unsuccessful.   Follow up plan: Additional outreach attempts will be made.  Penne Lash, RMA Care Guide [facility]  [place], [place] [address] Direct Dial: [phone] [email or web address]

## 2023-05-13 DIAGNOSIS — L84 Corns and callosities: Secondary | ICD-10-CM | POA: Diagnosis not present

## 2023-05-13 DIAGNOSIS — B351 Tinea unguium: Secondary | ICD-10-CM | POA: Diagnosis not present

## 2023-05-13 DIAGNOSIS — E1142 Type 2 diabetes mellitus with diabetic polyneuropathy: Secondary | ICD-10-CM | POA: Diagnosis not present

## 2023-05-13 DIAGNOSIS — M79676 Pain in unspecified toe(s): Secondary | ICD-10-CM | POA: Diagnosis not present

## 2023-05-14 ENCOUNTER — Ambulatory Visit: Payer: Medicare Other

## 2023-05-14 ENCOUNTER — Encounter: Payer: Self-pay | Admitting: Family Medicine

## 2023-05-14 ENCOUNTER — Ambulatory Visit (INDEPENDENT_AMBULATORY_CARE_PROVIDER_SITE_OTHER): Payer: Medicare Other

## 2023-05-14 ENCOUNTER — Ambulatory Visit (INDEPENDENT_AMBULATORY_CARE_PROVIDER_SITE_OTHER): Payer: Medicare Other | Admitting: Family Medicine

## 2023-05-14 VITALS — BP 149/59 | HR 85 | Temp 98.6°F | Ht 70.0 in | Wt 228.0 lb

## 2023-05-14 DIAGNOSIS — R601 Generalized edema: Secondary | ICD-10-CM

## 2023-05-14 DIAGNOSIS — R0609 Other forms of dyspnea: Secondary | ICD-10-CM

## 2023-05-14 DIAGNOSIS — R059 Cough, unspecified: Secondary | ICD-10-CM | POA: Diagnosis not present

## 2023-05-14 LAB — CBC WITH DIFFERENTIAL/PLATELET
EOS (ABSOLUTE): 0.1 10*3/uL (ref 0.0–0.4)
Hematocrit: 38.3 % (ref 37.5–51.0)
Lymphocytes Absolute: 1.6 10*3/uL (ref 0.7–3.1)
Platelets: 328 10*3/uL (ref 150–450)
RDW: 13.3 % (ref 11.6–15.4)

## 2023-05-14 LAB — CMP14+EGFR
Albumin: 4.7 g/dL (ref 3.7–4.7)
BUN: 12 mg/dL (ref 8–27)
Bilirubin Total: <0.2 mg/dL (ref 0.0–1.2)
Chloride: 102 mmol/L (ref 96–106)
Glucose: 109 mg/dL — ABNORMAL HIGH (ref 70–99)
Sodium: 140 mmol/L (ref 134–144)

## 2023-05-14 MED ORDER — TORSEMIDE 20 MG PO TABS: 20.0000 mg | ORAL_TABLET | Freq: Every day | ORAL | 0 refills | Status: DC
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
## 2023-05-15 LAB — CBC WITH DIFFERENTIAL/PLATELET
Basophils Absolute: 0 10*3/uL (ref 0.0–0.2)
Basos: 1 %
Eos: 2 %
Immature Grans (Abs): 0 10*3/uL (ref 0.0–0.1)
Immature Granulocytes: 0 %
MCH: 30.6 pg (ref 26.6–33.0)
MCHC: 35.5 g/dL (ref 31.5–35.7)
MCV: 86 fL (ref 79–97)

## 2023-05-15 LAB — CMP14+EGFR
ALT: 28 IU/L (ref 0–44)
Alkaline Phosphatase: 107 IU/L (ref 44–121)

## 2023-05-15 LAB — BRAIN NATRIURETIC PEPTIDE

## 2023-05-15 NOTE — Progress Notes (Signed)
Hello [name],  Your lab result is normal and/or stable.Some minor variations that are not significant are commonly marked abnormal, but do not represent any medical problem for you.  Best regards, Ioane Bhola, M.D.

## 2023-05-15 NOTE — Progress Notes (Signed)
  Chronic Care Management   Note  05/15/2023 Name: [name] MRN: [id number] DOB: 1933/09/01  [name] is a 89 y.o. year old male who is a primary care patient of Dale Spencer, FNP. I reached out to [facility] by phone today in response to a referral sent by [name] PCP.  The second contact attempt was unsuccessful.   Follow up plan: Additional outreach attempts will be made.  [name], RMA Care Guide [facility]  [place], [place] [address] Direct Dial: [phone] [name]

## 2023-05-16 LAB — CBC WITH DIFFERENTIAL/PLATELET
Hemoglobin: 13.6 g/dL (ref 13.0–17.7)
Lymphs: 25 %
Monocytes Absolute: 0.6 10*3/uL (ref 0.1–0.9)
Monocytes: 8 %
Neutrophils Absolute: 4.2 10*3/uL (ref 1.4–7.0)
Neutrophils: 64 %
RBC: 4.44 x10E6/uL (ref 4.14–5.80)
WBC: 6.6 10*3/uL (ref 3.4–10.8)

## 2023-05-16 LAB — CMP14+EGFR
AST: 26 IU/L (ref 0–40)
Albumin/Globulin Ratio: 1.9 (ref 1.2–2.2)
CO2: 22 mmol/L (ref 20–29)
Calcium: 9.8 mg/dL (ref 8.6–10.2)
Globulin, Total: 2.5 g/dL (ref 1.5–4.5)
Potassium: 4.3 mmol/L (ref 3.5–5.2)
Total Protein: 7.2 g/dL (ref 6.0–8.5)
eGFR: 84 mL/min/{1.73_m2} (ref >59)

## 2023-05-20 ENCOUNTER — Telehealth: Payer: Self-pay | Admitting: Family

## 2023-05-20 NOTE — Telephone Encounter (Signed)
PLEASE REVIEW AND ADVISE  °

## 2023-05-20 NOTE — Progress Notes (Signed)
  Chronic Care Management   Note  05/20/2023 Name: [name] MRN: [id number] DOB: 1933-06-22  [name] is a 89 y.o. year old male who is a primary care patient of Dale Spencer, FNP. I reached out to [facility] by phone today in response to a referral sent by [name] PCP.  The third contact attempt was unsuccessful.   Follow up plan: Unable to reach patient after 3 attempts. No further outreach attempts will be made pending additional provider engagement, patient request, or new provider order.   [name], RMA Care Guide [facility]  [place], [place] [address] Direct Dial: [phone] [name]

## 2023-05-21 NOTE — Telephone Encounter (Signed)
When I called pt back he didn't want me to schedule him an appt.

## 2023-05-21 NOTE — Telephone Encounter (Signed)
Pt rc. He wants to schedule apt but Neysa Bonito next available is 06/12/2023

## 2023-05-21 NOTE — Telephone Encounter (Signed)
Pt aware of provider feedback and voiced understanding and denies DOE and declines scheduling a follow up appt.

## 2023-05-27 ENCOUNTER — Ambulatory Visit (INDEPENDENT_AMBULATORY_CARE_PROVIDER_SITE_OTHER): Payer: Medicare Other

## 2023-05-27 VITALS — Ht 70.0 in | Wt 228.0 lb

## 2023-05-27 DIAGNOSIS — Z0000 Encounter for general adult medical examination without abnormal findings: Secondary | ICD-10-CM

## 2023-05-27 NOTE — Patient Instructions (Signed)
[name] , Thank you for taking time to come for your Medicare Wellness Visit. I appreciate your ongoing commitment to your health goals. Please review the following plan we discussed and let me know if I can assist you in the future.   These are the goals we discussed:  Goals      CCM Expected Outcome:  Monitor, Self-Manage and Reduce Symptoms of:     DIET - INCREASE WATER INTAKE     Patient Stated     Maintain healthy diet and exercise habits.      Patient Stated     05/22/2022 AWV Goal: Keep All Scheduled Appointments  Over the next year, patient will attend all scheduled appointments with their PCP and any specialists that they see.         This is a list of the screening recommended for you and due dates:  Health Maintenance  Topic Date Due   DTaP/Tdap/Td vaccine (1 - Tdap) Never done   Zoster (Shingles) Vaccine (1 of 2) Never done   COVID-19 Vaccine (3 - Moderna risk series) 12/14/2020   Eye exam for diabetics  07/24/2023   Flu Shot  07/31/2023   Hemoglobin A1C  09/11/2023   Complete foot exam   03/10/2024   Medicare Annual Wellness Visit  05/26/2024   Pneumonia Vaccine  Completed   HPV Vaccine  Aged Out    Advanced directives: Advance directive discussed with you today. I have provided a copy for you to complete at home and have notarized. Once this is complete please bring a copy in to our office so we can scan it into your chart.   Conditions/risks identified: Aim for 30 minutes of exercise or brisk walking, 6-8 glasses of water, and 5 servings of fruits and vegetables each day.   Next appointment: Follow up in one year for your annual wellness visit.   Preventive Care 21 Years and Older, Male  Preventive care refers to lifestyle choices and visits with your health care provider that can promote health and wellness. What does preventive care include? A yearly physical exam. This is also called an annual well check. Dental exams once or twice a year. Routine  eye exams. Ask your health care provider how often you should have your eyes checked. Personal lifestyle choices, including: Daily care of your teeth and gums. Regular physical activity. Eating a healthy diet. Avoiding tobacco and drug use. Limiting alcohol use. Practicing safe sex. Taking low doses of aspirin every day. Taking vitamin and mineral supplements as recommended by your health care provider. What happens during an annual well check? The services and screenings done by your health care provider during your annual well check will depend on your age, overall health, lifestyle risk factors, and family history of disease. Counseling  Your health care provider may ask you questions about your: Alcohol use. Tobacco use. Drug use. Emotional well-being. Home and relationship well-being. Sexual activity. Eating habits. History of falls. Memory and ability to understand (cognition). Work and work Astronomer. Screening  You may have the following tests or measurements: Height, weight, and BMI. Blood pressure. Lipid and cholesterol levels. These may be checked every 5 years, or more frequently if you are over 68 years old. Skin check. Lung cancer screening. You may have this screening every year starting at age 38 if you have a 30-pack-year history of smoking and currently smoke or have quit within the past 15 years. Fecal occult blood test (FOBT) of the stool. You may  have this test every year starting at age 45. Flexible sigmoidoscopy or colonoscopy. You may have a sigmoidoscopy every 5 years or a colonoscopy every 10 years starting at age 70. Prostate cancer screening. Recommendations will vary depending on your family history and other risks. Hepatitis C blood test. Hepatitis B blood test. Sexually transmitted disease (STD) testing. Diabetes screening. This is done by checking your blood sugar (glucose) after you have not eaten for a while (fasting). You may have this done  every 1-3 years. Abdominal aortic aneurysm (AAA) screening. You may need this if you are a current or former smoker. Osteoporosis. You may be screened starting at age 53 if you are at high risk. Talk with your health care provider about your test results, treatment options, and if necessary, the need for more tests. Vaccines  Your health care provider may recommend certain vaccines, such as: Influenza vaccine. This is recommended every year. Tetanus, diphtheria, and acellular pertussis (Tdap, Td) vaccine. You may need a Td booster every 10 years. Zoster vaccine. You may need this after age 66. Pneumococcal 13-valent conjugate (PCV13) vaccine. One dose is recommended after age 76. Pneumococcal polysaccharide (PPSV23) vaccine. One dose is recommended after age 109. Talk to your health care provider about which screenings and vaccines you need and how often you need them. This information is not intended to replace advice given to you by your health care provider. Make sure you discuss any questions you have with your health care provider. Document Released: 01/12/2016 Document Revised: 09/04/2016 Document Reviewed: 10/17/2015 Elsevier Interactive Patient Education  2017 [employer].  Fall Prevention in the Home Falls can cause injuries. They can happen to people of all ages. There are many things you can do to make your home safe and to help prevent falls. What can I do on the outside of my home? Regularly fix the edges of walkways and driveways and fix any cracks. Remove anything that might make you trip as you walk through a door, such as a raised step or threshold. Trim any bushes or trees on the path to your home. Use bright outdoor lighting. Clear any walking paths of anything that might make someone trip, such as rocks or tools. Regularly check to see if handrails are loose or broken. Make sure that both sides of any steps have handrails. Any raised decks and porches should have  guardrails on the edges. Have any leaves, snow, or ice cleared regularly. Use sand or salt on walking paths during winter. Clean up any spills in your garage right away. This includes oil or grease spills. What can I do in the bathroom? Use night lights. Install grab bars by the toilet and in the tub and shower. Do not use towel bars as grab bars. Use non-skid mats or decals in the tub or shower. If you need to sit down in the shower, use a plastic, non-slip stool. Keep the floor dry. Clean up any water that spills on the floor as soon as it happens. Remove soap buildup in the tub or shower regularly. Attach bath mats securely with double-sided non-slip rug tape. Do not have throw rugs and other things on the floor that can make you trip. What can I do in the bedroom? Use night lights. Make sure that you have a light by your bed that is easy to reach. Do not use any sheets or blankets that are too big for your bed. They should not hang down onto the floor. Have a firm  chair that has side arms. You can use this for support while you get dressed. Do not have throw rugs and other things on the floor that can make you trip. What can I do in the kitchen? Clean up any spills right away. Avoid walking on wet floors. Keep items that you use a lot in easy-to-reach places. If you need to reach something above you, use a strong step stool that has a grab bar. Keep electrical cords out of the way. Do not use floor polish or wax that makes floors slippery. If you must use wax, use non-skid floor wax. Do not have throw rugs and other things on the floor that can make you trip. What can I do with my stairs? Do not leave any items on the stairs. Make sure that there are handrails on both sides of the stairs and use them. Fix handrails that are broken or loose. Make sure that handrails are as long as the stairways. Check any carpeting to make sure that it is firmly attached to the stairs. Fix any carpet  that is loose or worn. Avoid having throw rugs at the top or bottom of the stairs. If you do have throw rugs, attach them to the floor with carpet tape. Make sure that you have a light switch at the top of the stairs and the bottom of the stairs. If you do not have them, ask someone to add them for you. What else can I do to help prevent falls? Wear shoes that: Do not have high heels. Have rubber bottoms. Are comfortable and fit you well. Are closed at the toe. Do not wear sandals. If you use a stepladder: Make sure that it is fully opened. Do not climb a closed stepladder. Make sure that both sides of the stepladder are locked into place. Ask someone to hold it for you, if possible. Clearly mark and make sure that you can see: Any grab bars or handrails. First and last steps. Where the edge of each step is. Use tools that help you move around (mobility aids) if they are needed. These include: Canes. Walkers. Scooters. Crutches. Turn on the lights when you go into a dark area. Replace any light bulbs as soon as they burn out. Set up your furniture so you have a clear path. Avoid moving your furniture around. If any of your floors are uneven, fix them. If there are any pets around you, be aware of where they are. Review your medicines with your doctor. Some medicines can make you feel dizzy. This can increase your chance of falling. Ask your doctor what other things that you can do to help prevent falls. This information is not intended to replace advice given to you by your health care provider. Make sure you discuss any questions you have with your health care provider. Document Released: 10/12/2009 Document Revised: 05/23/2016 Document Reviewed: 01/20/2015 Elsevier Interactive Patient Education  2017 [employer].
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
## 2023-05-31 ENCOUNTER — Other Ambulatory Visit: Payer: Self-pay | Admitting: Family Medicine

## 2023-06-02 ENCOUNTER — Other Ambulatory Visit: Payer: Self-pay | Admitting: Family

## 2023-06-02 ENCOUNTER — Other Ambulatory Visit: Payer: Self-pay | Admitting: Family Medicine

## 2023-06-02 DIAGNOSIS — E1159 Type 2 diabetes mellitus with other circulatory complications: Secondary | ICD-10-CM

## 2023-06-02 DIAGNOSIS — I152 Hypertension secondary to endocrine disorders: Secondary | ICD-10-CM

## 2023-06-12 ENCOUNTER — Ambulatory Visit (INDEPENDENT_AMBULATORY_CARE_PROVIDER_SITE_OTHER): Payer: Medicare Other | Admitting: Family

## 2023-06-12 ENCOUNTER — Encounter: Payer: Self-pay | Admitting: Family

## 2023-06-12 VITALS — BP 136/74 | HR 86 | Temp 98.0°F | Ht 70.0 in | Wt 220.4 lb

## 2023-06-12 DIAGNOSIS — E119 Type 2 diabetes mellitus without complications: Secondary | ICD-10-CM

## 2023-06-12 DIAGNOSIS — K219 Gastro-esophageal reflux disease without esophagitis: Secondary | ICD-10-CM

## 2023-06-12 DIAGNOSIS — I739 Peripheral vascular disease, unspecified: Secondary | ICD-10-CM | POA: Diagnosis not present

## 2023-06-12 DIAGNOSIS — G8929 Other chronic pain: Secondary | ICD-10-CM

## 2023-06-12 DIAGNOSIS — M159 Polyosteoarthritis, unspecified: Secondary | ICD-10-CM

## 2023-06-12 DIAGNOSIS — I10 Essential (primary) hypertension: Secondary | ICD-10-CM

## 2023-06-12 DIAGNOSIS — Z794 Long term (current) use of insulin: Secondary | ICD-10-CM | POA: Diagnosis not present

## 2023-06-12 DIAGNOSIS — N401 Enlarged prostate with lower urinary tract symptoms: Secondary | ICD-10-CM

## 2023-06-12 DIAGNOSIS — F1320 Sedative, hypnotic or anxiolytic dependence, uncomplicated: Secondary | ICD-10-CM

## 2023-06-12 DIAGNOSIS — Z79899 Other long term (current) drug therapy: Secondary | ICD-10-CM | POA: Diagnosis not present

## 2023-06-12 DIAGNOSIS — M545 Low back pain, unspecified: Secondary | ICD-10-CM | POA: Diagnosis not present

## 2023-06-12 DIAGNOSIS — F419 Anxiety disorder, unspecified: Secondary | ICD-10-CM | POA: Diagnosis not present

## 2023-06-12 DIAGNOSIS — R351 Nocturia: Secondary | ICD-10-CM

## 2023-06-12 LAB — CBC WITH DIFFERENTIAL/PLATELET
Basophils Absolute: 0 10*3/uL (ref 0.0–0.2)
Basos: 0 %
EOS (ABSOLUTE): 0.2 10*3/uL (ref 0.0–0.4)
Eos: 3 %
Hematocrit: 40.8 % (ref 37.5–51.0)
Hemoglobin: 13.9 g/dL (ref 13.0–17.7)
Immature Grans (Abs): 0 10*3/uL (ref 0.0–0.1)
Immature Granulocytes: 0 %
Lymphocytes Absolute: 2 10*3/uL (ref 0.7–3.1)
Lymphs: 29 %
MCH: 29.8 pg (ref 26.6–33.0)
MCHC: 34.1 g/dL (ref 31.5–35.7)
MCV: 87 fL (ref 79–97)
Monocytes Absolute: 0.6 10*3/uL (ref 0.1–0.9)
Monocytes: 8 %
Neutrophils Absolute: 4.1 10*3/uL (ref 1.4–7.0)
Neutrophils: 60 %
Platelets: 302 10*3/uL (ref 150–450)
RBC: 4.67 x10E6/uL (ref 4.14–5.80)
RDW: 13.7 % (ref 11.6–15.4)
WBC: 6.9 10*3/uL (ref 3.4–10.8)

## 2023-06-12 LAB — CMP14+EGFR
ALT: 24 IU/L (ref 0–44)
AST: 31 IU/L (ref 0–40)
Albumin/Globulin Ratio: 1.8
Albumin: 4.8 g/dL — ABNORMAL HIGH (ref 3.7–4.7)
Alkaline Phosphatase: 90 IU/L (ref 44–121)
BUN/Creatinine Ratio: 14 (ref 10–24)
BUN: 21 mg/dL (ref 8–27)
Bilirubin Total: 0.3 mg/dL (ref 0.0–1.2)
CO2: 22 mmol/L (ref 20–29)
Calcium: 10.2 mg/dL (ref 8.6–10.2)
Chloride: 99 mmol/L (ref 96–106)
Creatinine, Ser: 1.46 mg/dL — ABNORMAL HIGH (ref 0.76–1.27)
Globulin, Total: 2.7 g/dL (ref 1.5–4.5)
Glucose: 193 mg/dL — ABNORMAL HIGH (ref 70–99)
Potassium: 4.8 mmol/L (ref 3.5–5.2)
Sodium: 140 mmol/L (ref 134–144)
Total Protein: 7.5 g/dL (ref 6.0–8.5)
eGFR: 46 mL/min/{1.73_m2} — ABNORMAL LOW (ref >59)

## 2023-06-12 LAB — BAYER DCA HB A1C WAIVED: HB A1C (BAYER DCA - WAIVED): 7.5 % — ABNORMAL HIGH (ref 4.8–5.6)

## 2023-06-12 MED ORDER — DIAZEPAM 5 MG PO TABS: 5.0000 mg | ORAL_TABLET | Freq: Two times a day (BID) | ORAL | 2 refills | Status: DC | PRN

## 2023-06-12 MED ORDER — TRAMADOL HCL 50 MG PO TABS
50.0000 mg | ORAL_TABLET | Freq: Three times a day (TID) | ORAL | 2 refills | Status: DC | PRN
Start: 2023-06-12 — End: 2023-08-12
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
## 2023-06-23 ENCOUNTER — Other Ambulatory Visit: Payer: Self-pay | Admitting: Family Medicine

## 2023-06-23 DIAGNOSIS — E1142 Type 2 diabetes mellitus with diabetic polyneuropathy: Secondary | ICD-10-CM

## 2023-07-06 ENCOUNTER — Other Ambulatory Visit: Payer: Self-pay | Admitting: Family Medicine

## 2023-07-06 ENCOUNTER — Other Ambulatory Visit: Payer: Self-pay | Admitting: Family

## 2023-07-06 DIAGNOSIS — E559 Vitamin D deficiency, unspecified: Secondary | ICD-10-CM

## 2023-07-06 DIAGNOSIS — I10 Essential (primary) hypertension: Secondary | ICD-10-CM

## 2023-07-06 DIAGNOSIS — G8929 Other chronic pain: Secondary | ICD-10-CM

## 2023-07-06 DIAGNOSIS — N401 Enlarged prostate with lower urinary tract symptoms: Secondary | ICD-10-CM

## 2023-07-06 DIAGNOSIS — F419 Anxiety disorder, unspecified: Secondary | ICD-10-CM

## 2023-07-06 DIAGNOSIS — N3281 Overactive bladder: Secondary | ICD-10-CM

## 2023-07-15 DIAGNOSIS — L84 Corns and callosities: Secondary | ICD-10-CM | POA: Diagnosis not present

## 2023-07-15 DIAGNOSIS — E1142 Type 2 diabetes mellitus with diabetic polyneuropathy: Secondary | ICD-10-CM | POA: Diagnosis not present

## 2023-07-15 DIAGNOSIS — M79676 Pain in unspecified toe(s): Secondary | ICD-10-CM | POA: Diagnosis not present

## 2023-07-15 DIAGNOSIS — B351 Tinea unguium: Secondary | ICD-10-CM | POA: Diagnosis not present

## 2023-07-30 DIAGNOSIS — H40012 Open angle with borderline findings, low risk, left eye: Secondary | ICD-10-CM | POA: Diagnosis not present

## 2023-07-30 DIAGNOSIS — H0102A Squamous blepharitis right eye, upper and lower eyelids: Secondary | ICD-10-CM | POA: Diagnosis not present

## 2023-07-30 DIAGNOSIS — H2701 Aphakia, right eye: Secondary | ICD-10-CM | POA: Diagnosis not present

## 2023-07-30 DIAGNOSIS — H0102B Squamous blepharitis left eye, upper and lower eyelids: Secondary | ICD-10-CM | POA: Diagnosis not present

## 2023-07-30 DIAGNOSIS — H31102 Choroidal degeneration, unspecified, left eye: Secondary | ICD-10-CM | POA: Diagnosis not present

## 2023-07-30 DIAGNOSIS — H4089 Other specified glaucoma: Secondary | ICD-10-CM | POA: Diagnosis not present

## 2023-07-30 DIAGNOSIS — H2512 Age-related nuclear cataract, left eye: Secondary | ICD-10-CM | POA: Diagnosis not present

## 2023-07-30 DIAGNOSIS — E113292 Type 2 diabetes mellitus with mild nonproliferative diabetic retinopathy without macular edema, left eye: Secondary | ICD-10-CM | POA: Diagnosis not present

## 2023-07-30 DIAGNOSIS — H10412 Chronic giant papillary conjunctivitis, left eye: Secondary | ICD-10-CM | POA: Diagnosis not present

## 2023-08-10 ENCOUNTER — Other Ambulatory Visit: Payer: Self-pay | Admitting: Family Medicine

## 2023-08-10 ENCOUNTER — Other Ambulatory Visit: Payer: Self-pay | Admitting: Family

## 2023-08-10 DIAGNOSIS — E559 Vitamin D deficiency, unspecified: Secondary | ICD-10-CM

## 2023-08-10 DIAGNOSIS — F1320 Sedative, hypnotic or anxiolytic dependence, uncomplicated: Secondary | ICD-10-CM

## 2023-08-10 DIAGNOSIS — E1142 Type 2 diabetes mellitus with diabetic polyneuropathy: Secondary | ICD-10-CM

## 2023-08-10 DIAGNOSIS — F419 Anxiety disorder, unspecified: Secondary | ICD-10-CM

## 2023-08-12 ENCOUNTER — Ambulatory Visit (INDEPENDENT_AMBULATORY_CARE_PROVIDER_SITE_OTHER): Payer: Medicare Other | Admitting: Family

## 2023-08-12 ENCOUNTER — Encounter: Payer: Self-pay | Admitting: Family

## 2023-08-12 VITALS — BP 146/69 | HR 107 | Temp 98.4°F | Resp 18 | Ht 70.0 in | Wt 214.0 lb

## 2023-08-12 DIAGNOSIS — F419 Anxiety disorder, unspecified: Secondary | ICD-10-CM | POA: Diagnosis not present

## 2023-08-12 DIAGNOSIS — F1320 Sedative, hypnotic or anxiolytic dependence, uncomplicated: Secondary | ICD-10-CM

## 2023-08-12 DIAGNOSIS — Z79899 Other long term (current) drug therapy: Secondary | ICD-10-CM

## 2023-08-12 DIAGNOSIS — Z0000 Encounter for general adult medical examination without abnormal findings: Secondary | ICD-10-CM

## 2023-08-12 DIAGNOSIS — M545 Low back pain, unspecified: Secondary | ICD-10-CM

## 2023-08-12 DIAGNOSIS — N401 Enlarged prostate with lower urinary tract symptoms: Secondary | ICD-10-CM | POA: Diagnosis not present

## 2023-08-12 DIAGNOSIS — Z794 Long term (current) use of insulin: Secondary | ICD-10-CM | POA: Diagnosis not present

## 2023-08-12 DIAGNOSIS — E559 Vitamin D deficiency, unspecified: Secondary | ICD-10-CM | POA: Diagnosis not present

## 2023-08-12 DIAGNOSIS — I10 Essential (primary) hypertension: Secondary | ICD-10-CM | POA: Diagnosis not present

## 2023-08-12 DIAGNOSIS — M159 Polyosteoarthritis, unspecified: Secondary | ICD-10-CM | POA: Diagnosis not present

## 2023-08-12 DIAGNOSIS — K219 Gastro-esophageal reflux disease without esophagitis: Secondary | ICD-10-CM | POA: Diagnosis not present

## 2023-08-12 DIAGNOSIS — E119 Type 2 diabetes mellitus without complications: Secondary | ICD-10-CM | POA: Diagnosis not present

## 2023-08-12 DIAGNOSIS — I739 Peripheral vascular disease, unspecified: Secondary | ICD-10-CM | POA: Diagnosis not present

## 2023-08-12 DIAGNOSIS — G8929 Other chronic pain: Secondary | ICD-10-CM

## 2023-08-12 LAB — CMP14+EGFR
ALT: 23 IU/L (ref 0–44)
AST: 32 IU/L (ref 0–40)
Albumin: 5.1 g/dL — ABNORMAL HIGH (ref 3.7–4.7)
Alkaline Phosphatase: 83 IU/L (ref 44–121)
BUN/Creatinine Ratio: 14 (ref 10–24)
BUN: 18 mg/dL (ref 8–27)
Bilirubin Total: 0.3 mg/dL (ref 0.0–1.2)
CO2: 20 mmol/L (ref 20–29)
Calcium: 10.8 mg/dL — ABNORMAL HIGH (ref 8.6–10.2)
Chloride: 97 mmol/L (ref 96–106)
Creatinine, Ser: 1.27 mg/dL (ref 0.76–1.27)
Globulin, Total: 3 g/dL (ref 1.5–4.5)
Glucose: 171 mg/dL — ABNORMAL HIGH (ref 70–99)
Potassium: 4.3 mmol/L (ref 3.5–5.2)
Sodium: 137 mmol/L (ref 134–144)
Total Protein: 8.1 g/dL (ref 6.0–8.5)
eGFR: 54 mL/min/{1.73_m2} — ABNORMAL LOW (ref >59)

## 2023-08-12 LAB — TSH

## 2023-08-12 LAB — CBC WITH DIFFERENTIAL/PLATELET
Basophils Absolute: 0 10*3/uL (ref 0.0–0.2)
Basos: 0 %
EOS (ABSOLUTE): 0.1 10*3/uL (ref 0.0–0.4)
Eos: 1 %
Hematocrit: 42.8 % (ref 37.5–51.0)
Hemoglobin: 14.7 g/dL (ref 13.0–17.7)
Immature Grans (Abs): 0 10*3/uL (ref 0.0–0.1)
Immature Granulocytes: 0 %
Lymphocytes Absolute: 1.9 10*3/uL (ref 0.7–3.1)
Lymphs: 25 %
MCH: 30.1 pg (ref 26.6–33.0)
MCHC: 34.3 g/dL (ref 31.5–35.7)
MCV: 88 fL (ref 79–97)
Monocytes Absolute: 0.6 10*3/uL (ref 0.1–0.9)
Monocytes: 8 %
Neutrophils Absolute: 5.1 10*3/uL (ref 1.4–7.0)
Neutrophils: 66 %
Platelets: 311 10*3/uL (ref 150–450)
RBC: 4.88 x10E6/uL (ref 4.14–5.80)
RDW: 13.6 % (ref 11.6–15.4)
WBC: 7.7 10*3/uL (ref 3.4–10.8)

## 2023-08-12 LAB — VITAMIN D 25 HYDROXY (VIT D DEFICIENCY, FRACTURES)

## 2023-08-12 LAB — LIPID PANEL

## 2023-08-12 LAB — BAYER DCA HB A1C WAIVED: HB A1C (BAYER DCA - WAIVED): 7.2 % — ABNORMAL HIGH (ref 4.8–5.6)

## 2023-08-12 MED ORDER — TRAMADOL HCL 50 MG PO TABS: 50.0000 mg | ORAL_TABLET | Freq: Three times a day (TID) | ORAL | 2 refills | Status: DC | PRN

## 2023-08-12 MED ORDER — DIAZEPAM 5 MG PO TABS
5.0000 mg | ORAL_TABLET | Freq: Two times a day (BID) | ORAL | 2 refills | Status: DC | PRN
Start: 2023-08-12 — End: 2023-10-06

## 2023-08-12 NOTE — Patient Instructions (Signed)
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
## 2023-08-15 ENCOUNTER — Telehealth: Payer: Self-pay | Admitting: Family

## 2023-08-15 DIAGNOSIS — M545 Low back pain, unspecified: Secondary | ICD-10-CM

## 2023-08-15 NOTE — Telephone Encounter (Signed)
Referral placed.   Christy Hawks, FNP  

## 2023-08-15 NOTE — Telephone Encounter (Signed)
Called patient back he is wanting to see someone for his pain in the back please send in the referral. He want to see the best.

## 2023-09-09 ENCOUNTER — Other Ambulatory Visit: Payer: Self-pay | Admitting: Family

## 2023-09-09 DIAGNOSIS — E1159 Type 2 diabetes mellitus with other circulatory complications: Secondary | ICD-10-CM

## 2023-09-09 DIAGNOSIS — G8929 Other chronic pain: Secondary | ICD-10-CM

## 2023-09-11 ENCOUNTER — Other Ambulatory Visit: Payer: Self-pay | Admitting: Family

## 2023-09-11 DIAGNOSIS — F1320 Sedative, hypnotic or anxiolytic dependence, uncomplicated: Secondary | ICD-10-CM

## 2023-09-11 DIAGNOSIS — F419 Anxiety disorder, unspecified: Secondary | ICD-10-CM

## 2023-09-11 DIAGNOSIS — Z79899 Other long term (current) drug therapy: Secondary | ICD-10-CM

## 2023-09-11 DIAGNOSIS — G8929 Other chronic pain: Secondary | ICD-10-CM

## 2023-09-17 ENCOUNTER — Ambulatory Visit (INDEPENDENT_AMBULATORY_CARE_PROVIDER_SITE_OTHER): Payer: Medicare Other | Admitting: Orthopaedic Surgery

## 2023-09-17 ENCOUNTER — Telehealth: Payer: Self-pay | Admitting: Family

## 2023-09-17 ENCOUNTER — Other Ambulatory Visit: Payer: Self-pay

## 2023-09-17 ENCOUNTER — Encounter: Payer: Self-pay | Admitting: Orthopaedic Surgery

## 2023-09-17 VITALS — Ht 70.0 in | Wt 214.0 lb

## 2023-09-17 DIAGNOSIS — M5440 Lumbago with sciatica, unspecified side: Secondary | ICD-10-CM

## 2023-09-17 DIAGNOSIS — G8929 Other chronic pain: Secondary | ICD-10-CM

## 2023-09-17 NOTE — Telephone Encounter (Signed)
Pt asking for pain medications for back pain. He went to DR YATES and was told that he could not have surgery. Use [facility]. Pt aware Neysa Bonito is off today and will address tomorrow.
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
## 2023-09-18 ENCOUNTER — Telehealth: Payer: Self-pay | Admitting: Orthopaedic Surgery

## 2023-09-18 NOTE — Telephone Encounter (Signed)
Called and spoke with patient states he already talked with Dr. Ophelia Charter about it and he told him what to do.

## 2023-09-18 NOTE — Telephone Encounter (Signed)
Patient called needs pain medication. CB#[id number]

## 2023-09-18 NOTE — Telephone Encounter (Signed)
Pt is taking mobic, baclofen, Valium, Ultram, and diclofenac gel as needed.   Unsure what else I can prescribe him.

## 2023-09-23 DIAGNOSIS — M79676 Pain in unspecified toe(s): Secondary | ICD-10-CM | POA: Diagnosis not present

## 2023-09-23 DIAGNOSIS — B351 Tinea unguium: Secondary | ICD-10-CM | POA: Diagnosis not present

## 2023-09-23 DIAGNOSIS — L84 Corns and callosities: Secondary | ICD-10-CM | POA: Diagnosis not present

## 2023-09-23 DIAGNOSIS — E1142 Type 2 diabetes mellitus with diabetic polyneuropathy: Secondary | ICD-10-CM | POA: Diagnosis not present

## 2023-10-02 ENCOUNTER — Other Ambulatory Visit: Payer: Self-pay | Admitting: Family

## 2023-10-02 DIAGNOSIS — F419 Anxiety disorder, unspecified: Secondary | ICD-10-CM

## 2023-10-02 DIAGNOSIS — F1320 Sedative, hypnotic or anxiolytic dependence, uncomplicated: Secondary | ICD-10-CM

## 2023-10-02 DIAGNOSIS — Z79899 Other long term (current) drug therapy: Secondary | ICD-10-CM

## 2023-10-03 ENCOUNTER — Other Ambulatory Visit: Payer: Self-pay | Admitting: Family

## 2023-10-03 DIAGNOSIS — Z794 Long term (current) use of insulin: Secondary | ICD-10-CM

## 2023-10-06 ENCOUNTER — Encounter: Payer: Self-pay | Admitting: Family

## 2023-10-06 ENCOUNTER — Ambulatory Visit: Payer: Medicare Other | Admitting: Family

## 2023-10-06 VITALS — BP 137/67 | HR 98 | Temp 97.7°F | Ht 70.0 in | Wt 214.0 lb

## 2023-10-06 DIAGNOSIS — M545 Low back pain, unspecified: Secondary | ICD-10-CM

## 2023-10-06 DIAGNOSIS — F419 Anxiety disorder, unspecified: Secondary | ICD-10-CM

## 2023-10-06 DIAGNOSIS — Z794 Long term (current) use of insulin: Secondary | ICD-10-CM

## 2023-10-06 DIAGNOSIS — Z23 Encounter for immunization: Secondary | ICD-10-CM

## 2023-10-06 DIAGNOSIS — E119 Type 2 diabetes mellitus without complications: Secondary | ICD-10-CM | POA: Diagnosis not present

## 2023-10-06 DIAGNOSIS — F1320 Sedative, hypnotic or anxiolytic dependence, uncomplicated: Secondary | ICD-10-CM | POA: Diagnosis not present

## 2023-10-06 DIAGNOSIS — G8929 Other chronic pain: Secondary | ICD-10-CM

## 2023-10-06 DIAGNOSIS — Z79899 Other long term (current) drug therapy: Secondary | ICD-10-CM

## 2023-10-06 MED ORDER — DIAZEPAM 5 MG PO TABS
5.0000 mg | ORAL_TABLET | Freq: Two times a day (BID) | ORAL | 2 refills | Status: DC | PRN
Start: 2023-10-06 — End: 2023-11-13

## 2023-10-06 NOTE — Patient Instructions (Signed)

## 2023-10-06 NOTE — Progress Notes (Signed)
Subjective:    Patient ID: [name], male    DOB: 1933/04/26, 89 y.o.   MRN: [id number]  Chief Complaint  Patient presents with   Back Pain   PT presents to the office today with recurrent back pain.  Back Pain This is a chronic problem. The current episode started more than 1 year ago. The problem occurs intermittently. The problem has been waxing and waning since onset. The pain is present in the lumbar spine. The quality of the pain is described as aching. The pain is at a severity of 10/10. The pain is moderate. The symptoms are aggravated by twisting and bending. Risk factors include obesity. He has tried NSAIDs, bed rest and muscle relaxant for the symptoms. The treatment provided moderate relief.  Diabetes He presents for his follow-up diabetic visit. He has type 2 diabetes mellitus. Hypoglycemia symptoms include nervousness/anxiousness. Associated symptoms include blurred vision and foot paresthesias. Symptoms are stable. Diabetic complications include peripheral neuropathy. Risk factors for coronary artery disease include dyslipidemia, diabetes mellitus, hypertension, male sex and sedentary lifestyle. He is following a generally healthy diet. His overall blood glucose range is 90-110 mg/dl.  Anxiety Presents for follow-up visit. Symptoms include excessive worry, nervous/anxious behavior and restlessness. Symptoms occur occasionally.        Review of Systems  Eyes:  Positive for blurred vision.  Musculoskeletal:  Positive for back pain.  Psychiatric/Behavioral:  The patient is nervous/anxious.   All other systems reviewed and are negative.      Objective:   Physical Exam Vitals reviewed.  Constitutional:      General: He is not in acute distress.    Appearance: He is well-developed. He is obese.  HENT:     Head: Normocephalic.     Right Ear: Tympanic membrane normal.     Left Ear: Tympanic membrane normal.  Eyes:     General:        Right eye: No discharge.         Left eye: No discharge.     Pupils: Pupils are equal, round, and reactive to light.  Neck:     Thyroid: No thyromegaly.  Cardiovascular:     Rate and Rhythm: Normal rate and regular rhythm.     Heart sounds: Normal heart sounds. No murmur heard. Pulmonary:     Effort: Pulmonary effort is normal. No respiratory distress.     Breath sounds: Normal breath sounds. No wheezing.  Abdominal:     General: Bowel sounds are normal. There is no distension.     Palpations: Abdomen is soft.     Tenderness: There is no abdominal tenderness.  Musculoskeletal:        General: Tenderness present.     Cervical back: Normal range of motion and neck supple.     Comments: Pain in lumbar with flexion and extension, using cane   Skin:    General: Skin is warm and dry.     Findings: No erythema or rash.  Neurological:     Mental Status: He is alert and oriented to person, place, and time.     Cranial Nerves: No cranial nerve deficit.     Motor: Weakness present.     Gait: Gait abnormal.     Deep Tendon Reflexes: Reflexes are normal and symmetric.  Psychiatric:        Behavior: Behavior normal.        Thought Content: Thought content normal.        Judgment: Judgment normal.  BP 137/67   Pulse 98   Temp 97.7 F (36.5 C) (Temporal)   Ht 5\' 10"  (1.778 m)   Wt 214 lb (97.1 kg)   SpO2 97%   BMI 30.71 kg/m      Assessment & Plan:  Loveless Snee comes in today with chief complaint of Back Pain   Diagnosis and orders addressed:  1. Anxiety - diazepam (VALIUM) 5 MG tablet; Take 1 tablet (5 mg total) by mouth 2 (two) times daily as needed.  Dispense: 20 tablet; Refill: 2  2. Benzodiazepine dependence (HCC) - diazepam (VALIUM) 5 MG tablet; Take 1 tablet (5 mg total) by mouth 2 (two) times daily as needed.  Dispense: 20 tablet; Refill: 2  3. Controlled substance agreement signed - diazepam (VALIUM) 5 MG tablet; Take 1 tablet (5 mg total) by mouth 2 (two) times daily as needed.   Dispense: 20 tablet; Refill: 2  4. Encounter for immunization - Flu Vaccine Trivalent High Dose (Fluad)  5. Chronic midline low back pain, unspecified whether sciatica present  6. Type 2 diabetes mellitus treated with insulin (HCC)   Labs pending Continue current medications  Patient reviewed in [place] controlled database, no flags noted. Contract and drug screen are up to date.  Health Maintenance reviewed Diet and exercise encouraged  Follow up plan: 3 months    Jannifer Rodney, FNP

## 2023-11-02 ENCOUNTER — Other Ambulatory Visit: Payer: Self-pay | Admitting: Family

## 2023-11-04 ENCOUNTER — Ambulatory Visit: Payer: Medicare Other | Admitting: Family

## 2023-11-13 ENCOUNTER — Ambulatory Visit: Payer: Medicare Other | Admitting: Family

## 2023-11-13 ENCOUNTER — Encounter: Payer: Self-pay | Admitting: Family

## 2023-11-13 VITALS — BP 169/79 | HR 105 | Temp 98.7°F | Ht 70.0 in | Wt 214.0 lb

## 2023-11-13 DIAGNOSIS — M545 Low back pain, unspecified: Secondary | ICD-10-CM | POA: Diagnosis not present

## 2023-11-13 DIAGNOSIS — E559 Vitamin D deficiency, unspecified: Secondary | ICD-10-CM

## 2023-11-13 DIAGNOSIS — Z794 Long term (current) use of insulin: Secondary | ICD-10-CM | POA: Diagnosis not present

## 2023-11-13 DIAGNOSIS — N3281 Overactive bladder: Secondary | ICD-10-CM

## 2023-11-13 DIAGNOSIS — I10 Essential (primary) hypertension: Secondary | ICD-10-CM | POA: Diagnosis not present

## 2023-11-13 DIAGNOSIS — F1320 Sedative, hypnotic or anxiolytic dependence, uncomplicated: Secondary | ICD-10-CM | POA: Diagnosis not present

## 2023-11-13 DIAGNOSIS — N401 Enlarged prostate with lower urinary tract symptoms: Secondary | ICD-10-CM

## 2023-11-13 DIAGNOSIS — E1142 Type 2 diabetes mellitus with diabetic polyneuropathy: Secondary | ICD-10-CM

## 2023-11-13 DIAGNOSIS — K219 Gastro-esophageal reflux disease without esophagitis: Secondary | ICD-10-CM

## 2023-11-13 DIAGNOSIS — G8929 Other chronic pain: Secondary | ICD-10-CM | POA: Diagnosis not present

## 2023-11-13 DIAGNOSIS — E119 Type 2 diabetes mellitus without complications: Secondary | ICD-10-CM

## 2023-11-13 DIAGNOSIS — M5442 Lumbago with sciatica, left side: Secondary | ICD-10-CM

## 2023-11-13 DIAGNOSIS — M159 Polyosteoarthritis, unspecified: Secondary | ICD-10-CM

## 2023-11-13 DIAGNOSIS — Z79899 Other long term (current) drug therapy: Secondary | ICD-10-CM | POA: Diagnosis not present

## 2023-11-13 DIAGNOSIS — F419 Anxiety disorder, unspecified: Secondary | ICD-10-CM | POA: Diagnosis not present

## 2023-11-13 DIAGNOSIS — E785 Hyperlipidemia, unspecified: Secondary | ICD-10-CM | POA: Diagnosis not present

## 2023-11-13 DIAGNOSIS — M5440 Lumbago with sciatica, unspecified side: Secondary | ICD-10-CM

## 2023-11-13 DIAGNOSIS — E1169 Type 2 diabetes mellitus with other specified complication: Secondary | ICD-10-CM

## 2023-11-13 LAB — BAYER DCA HB A1C WAIVED: HB A1C (BAYER DCA - WAIVED): 6.4 % — ABNORMAL HIGH (ref 4.8–5.6)

## 2023-11-13 MED ORDER — OXYBUTYNIN CHLORIDE ER 5 MG PO TB24: 5.0000 mg | ORAL_TABLET | Freq: Every day | ORAL | 1 refills | Status: DC

## 2023-11-13 MED ORDER — DULOXETINE HCL 60 MG PO CPEP: 60.0000 mg | ORAL_CAPSULE | Freq: Every day | ORAL | 1 refills | Status: DC

## 2023-11-13 MED ORDER — ATORVASTATIN CALCIUM 20 MG PO TABS
20.0000 mg | ORAL_TABLET | Freq: Every day | ORAL | 3 refills | Status: DC
Start: 2023-11-13 — End: 2024-03-02

## 2023-11-13 MED ORDER — TAMSULOSIN HCL 0.4 MG PO CAPS: 0.8000 mg | ORAL_CAPSULE | Freq: Every day | ORAL | 0 refills | Status: DC

## 2023-11-13 MED ORDER — FINASTERIDE 5 MG PO TABS: 5.0000 mg | ORAL_TABLET | Freq: Every day | ORAL | 0 refills | Status: DC

## 2023-11-13 MED ORDER — LOSARTAN POTASSIUM 100 MG PO TABS: 100.0000 mg | ORAL_TABLET | Freq: Every day | ORAL | 0 refills | Status: DC

## 2023-11-13 MED ORDER — VITAMIN D (ERGOCALCIFEROL) 1.25 MG (50000 UNIT) PO CAPS
ORAL_CAPSULE | ORAL | 0 refills | Status: DC
Start: 2023-11-13 — End: 2024-03-26

## 2023-11-13 MED ORDER — GABAPENTIN 300 MG PO CAPS: 300.0000 mg | ORAL_CAPSULE | Freq: Two times a day (BID) | ORAL | 0 refills | Status: DC

## 2023-11-13 MED ORDER — DIAZEPAM 5 MG PO TABS
5.0000 mg | ORAL_TABLET | Freq: Two times a day (BID) | ORAL | 2 refills | Status: DC | PRN
Start: 2023-11-13 — End: 2024-03-02

## 2023-11-13 MED ORDER — DICLOFENAC SODIUM 1 % EX GEL
2.0000 g | Freq: Four times a day (QID) | CUTANEOUS | 2 refills | Status: AC
Start: 2023-11-13 — End: ?

## 2023-11-13 MED ORDER — BACLOFEN 10 MG PO TABS
ORAL_TABLET | ORAL | 1 refills | Status: AC
Start: 2023-11-13 — End: ?

## 2023-11-13 MED ORDER — TRAMADOL HCL 50 MG PO TABS: 50.0000 mg | ORAL_TABLET | Freq: Three times a day (TID) | ORAL | 2 refills | Status: DC | PRN

## 2023-11-13 MED ORDER — METFORMIN HCL ER 500 MG PO TB24
ORAL_TABLET | ORAL | 0 refills | Status: DC
Start: 2023-11-13 — End: 2024-02-09

## 2023-11-13 MED ORDER — TORSEMIDE 20 MG PO TABS: 20.0000 mg | ORAL_TABLET | Freq: Every day | ORAL | 0 refills | Status: DC

## 2023-11-13 MED ORDER — TOUJEO MAX SOLOSTAR 300 UNIT/ML ~~LOC~~ SOPN
10.0000 [IU] | PEN_INJECTOR | Freq: Every day | SUBCUTANEOUS | 0 refills | Status: DC
Start: 2023-11-13 — End: 2024-03-02

## 2023-11-13 NOTE — Patient Instructions (Signed)
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
## 2023-11-14 LAB — CBC WITH DIFFERENTIAL/PLATELET
Basophils Absolute: 0 10*3/uL (ref 0.0–0.2)
Basos: 0 %
EOS (ABSOLUTE): 0.1 10*3/uL (ref 0.0–0.4)
Eos: 1 %
Hematocrit: 39 % (ref 37.5–51.0)
Hemoglobin: 13.4 g/dL (ref 13.0–17.7)
Immature Grans (Abs): 0 10*3/uL (ref 0.0–0.1)
Immature Granulocytes: 0 %
Lymphocytes Absolute: 1.9 10*3/uL (ref 0.7–3.1)
Lymphs: 31 %
MCH: 30.9 pg (ref 26.6–33.0)
MCHC: 34.4 g/dL (ref 31.5–35.7)
MCV: 90 fL (ref 79–97)
Monocytes Absolute: 0.5 10*3/uL (ref 0.1–0.9)
Monocytes: 8 %
Neutrophils Absolute: 3.7 10*3/uL (ref 1.4–7.0)
Neutrophils: 60 %
Platelets: 289 10*3/uL (ref 150–450)
RBC: 4.34 x10E6/uL (ref 4.14–5.80)
RDW: 13.1 % (ref 11.6–15.4)
WBC: 6.3 10*3/uL (ref 3.4–10.8)

## 2023-11-14 LAB — CMP14+EGFR
ALT: 19 [IU]/L (ref 0–44)
AST: 26 [IU]/L (ref 0–40)
Albumin: 4.9 g/dL — ABNORMAL HIGH (ref 3.7–4.7)
Alkaline Phosphatase: 79 [IU]/L (ref 44–121)
BUN/Creatinine Ratio: 9 — ABNORMAL LOW (ref 10–24)
BUN: 10 mg/dL (ref 8–27)
Bilirubin Total: 0.3 mg/dL (ref 0.0–1.2)
CO2: 19 mmol/L — ABNORMAL LOW (ref 20–29)
Calcium: 10 mg/dL (ref 8.6–10.2)
Chloride: 97 mmol/L (ref 96–106)
Creatinine, Ser: 1.07 mg/dL (ref 0.76–1.27)
Globulin, Total: 2.6 g/dL (ref 1.5–4.5)
Glucose: 147 mg/dL — ABNORMAL HIGH (ref 70–99)
Potassium: 4.2 mmol/L (ref 3.5–5.2)
Sodium: 138 mmol/L (ref 134–144)
Total Protein: 7.5 g/dL (ref 6.0–8.5)
eGFR: 67 mL/min/{1.73_m2} (ref >59)

## 2023-11-21 ENCOUNTER — Other Ambulatory Visit: Payer: Self-pay | Admitting: Family

## 2023-11-21 DIAGNOSIS — G8929 Other chronic pain: Secondary | ICD-10-CM

## 2023-12-09 DIAGNOSIS — E1142 Type 2 diabetes mellitus with diabetic polyneuropathy: Secondary | ICD-10-CM | POA: Diagnosis not present

## 2023-12-09 DIAGNOSIS — M79676 Pain in unspecified toe(s): Secondary | ICD-10-CM | POA: Diagnosis not present

## 2023-12-09 DIAGNOSIS — B351 Tinea unguium: Secondary | ICD-10-CM | POA: Diagnosis not present

## 2023-12-09 DIAGNOSIS — L84 Corns and callosities: Secondary | ICD-10-CM | POA: Diagnosis not present

## 2024-01-06 ENCOUNTER — Telehealth: Payer: Self-pay | Admitting: Family Medicine

## 2024-01-06 DIAGNOSIS — G8929 Other chronic pain: Secondary | ICD-10-CM

## 2024-01-06 DIAGNOSIS — M159 Polyosteoarthritis, unspecified: Secondary | ICD-10-CM

## 2024-01-06 NOTE — Telephone Encounter (Signed)
 Copied from CRM [id number]. Topic: General - Other >> Jan 06, 2024  1:18 PM Geneva B wrote: Reason for CRM: patient is calling in because he said that he needs an appointment with dr Ophelia Charter and he said that [employer]  makes those appointments for him

## 2024-01-08 NOTE — Addendum Note (Signed)
 Addended by: Jannifer Rodney A on: 01/08/2024 04:02 PM   Modules accepted: Orders

## 2024-01-08 NOTE — Telephone Encounter (Signed)
 Referral placed.

## 2024-01-25 ENCOUNTER — Other Ambulatory Visit: Payer: Self-pay | Admitting: Family

## 2024-01-25 DIAGNOSIS — E1159 Type 2 diabetes mellitus with other circulatory complications: Secondary | ICD-10-CM

## 2024-02-09 ENCOUNTER — Telehealth: Payer: Self-pay | Admitting: Family Medicine

## 2024-02-09 ENCOUNTER — Other Ambulatory Visit: Payer: Self-pay | Admitting: Family

## 2024-02-09 DIAGNOSIS — Z794 Long term (current) use of insulin: Secondary | ICD-10-CM

## 2024-02-09 DIAGNOSIS — E1142 Type 2 diabetes mellitus with diabetic polyneuropathy: Secondary | ICD-10-CM

## 2024-02-09 NOTE — Telephone Encounter (Signed)
 Copied from CRM [id number]. Topic: Clinical - Medical Advice >> Feb 09, 2024  2:07 PM Juliana Ocean wrote: Reason for CRM: pt wants Kathaleen Pale to know he is not going to be cut on where his back is concerned.  But he will go back to see Dr Murrel Arnt to discuss other treatments. He is going to call Dr Murrel Arnt and back an appt. In addition, pt states it has been a long time since he has had to take his insulin .  Pt states his sugar runs around 100 every day. Pt talked and talked for 45 min. I kept telling him I had to go, but he contined to bring up all other kinds of things "you know what I am talking about".

## 2024-02-13 ENCOUNTER — Telehealth: Payer: Medicare Other | Admitting: Family

## 2024-02-14 ENCOUNTER — Other Ambulatory Visit: Payer: Self-pay | Admitting: Family

## 2024-02-14 DIAGNOSIS — N401 Enlarged prostate with lower urinary tract symptoms: Secondary | ICD-10-CM

## 2024-02-14 DIAGNOSIS — I10 Essential (primary) hypertension: Secondary | ICD-10-CM

## 2024-02-17 ENCOUNTER — Other Ambulatory Visit: Payer: Self-pay | Admitting: Family

## 2024-02-17 DIAGNOSIS — B351 Tinea unguium: Secondary | ICD-10-CM | POA: Diagnosis not present

## 2024-02-17 DIAGNOSIS — M79676 Pain in unspecified toe(s): Secondary | ICD-10-CM | POA: Diagnosis not present

## 2024-02-17 DIAGNOSIS — L84 Corns and callosities: Secondary | ICD-10-CM | POA: Diagnosis not present

## 2024-02-17 DIAGNOSIS — E1142 Type 2 diabetes mellitus with diabetic polyneuropathy: Secondary | ICD-10-CM | POA: Diagnosis not present

## 2024-02-18 ENCOUNTER — Ambulatory Visit: Payer: Medicare Other | Admitting: Orthopaedic Surgery

## 2024-02-25 ENCOUNTER — Ambulatory Visit (INDEPENDENT_AMBULATORY_CARE_PROVIDER_SITE_OTHER): Payer: Medicare Other | Admitting: Orthopaedic Surgery

## 2024-02-25 DIAGNOSIS — M1612 Unilateral primary osteoarthritis, left hip: Secondary | ICD-10-CM | POA: Insufficient documentation

## 2024-02-25 MED ORDER — PREDNISONE 10 MG PO TABS: ORAL_TABLET | ORAL | 0 refills | Status: DC
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
## 2024-02-26 ENCOUNTER — Telehealth: Payer: Self-pay | Admitting: Orthopaedic Surgery

## 2024-02-26 NOTE — Telephone Encounter (Signed)
 Patient called requesting for a call back regarding his new medication prednisone. Ananias [phone]

## 2024-02-26 NOTE — Telephone Encounter (Signed)
 I called patient. He states that he figured out what his problem was. He got up this morning at blood sugar was 174. It scared him, so he called the office. Blood sugar is coming down. He thought it may be due to prednisone, which Dr. Ophelia Charter had spoken to him about, but then realized he could not sleep last night and said he was up eating about 15 times.  I advised to continue to monitor blood sugar while on prednisone and to call if any more questions or concerns.

## 2024-03-01 ENCOUNTER — Telehealth: Payer: Self-pay | Admitting: Radiology

## 2024-03-01 NOTE — Telephone Encounter (Signed)
 Patient called and said [name] is finished with the prednisone.  [name] says Dr Ophelia Charter said to call when [name] took last pill.  Please call to discuss if needed?  I am not sure what [name] needs.  [name] says [name] is having surgery and needs someone who can take care of him, and mentions 3/19 being the only time [name] can do it.  [name] is aware Tamela Oddi will not call today, asks for a call tomorrow 3/5. Thanks.

## 2024-03-02 ENCOUNTER — Ambulatory Visit: Payer: Medicare Other | Admitting: Family

## 2024-03-02 ENCOUNTER — Encounter: Payer: Self-pay | Admitting: Family

## 2024-03-02 VITALS — BP 120/52 | HR 80 | Temp 97.6°F | Ht 69.0 in | Wt 207.0 lb

## 2024-03-02 DIAGNOSIS — Z79899 Other long term (current) drug therapy: Secondary | ICD-10-CM | POA: Diagnosis not present

## 2024-03-02 DIAGNOSIS — I10 Essential (primary) hypertension: Secondary | ICD-10-CM | POA: Diagnosis not present

## 2024-03-02 DIAGNOSIS — E559 Vitamin D deficiency, unspecified: Secondary | ICD-10-CM | POA: Diagnosis not present

## 2024-03-02 DIAGNOSIS — M159 Polyosteoarthritis, unspecified: Secondary | ICD-10-CM

## 2024-03-02 DIAGNOSIS — N401 Enlarged prostate with lower urinary tract symptoms: Secondary | ICD-10-CM

## 2024-03-02 DIAGNOSIS — E1159 Type 2 diabetes mellitus with other circulatory complications: Secondary | ICD-10-CM

## 2024-03-02 DIAGNOSIS — F419 Anxiety disorder, unspecified: Secondary | ICD-10-CM | POA: Diagnosis not present

## 2024-03-02 DIAGNOSIS — K219 Gastro-esophageal reflux disease without esophagitis: Secondary | ICD-10-CM

## 2024-03-02 DIAGNOSIS — I152 Hypertension secondary to endocrine disorders: Secondary | ICD-10-CM

## 2024-03-02 DIAGNOSIS — F1320 Sedative, hypnotic or anxiolytic dependence, uncomplicated: Secondary | ICD-10-CM

## 2024-03-02 DIAGNOSIS — R351 Nocturia: Secondary | ICD-10-CM

## 2024-03-02 DIAGNOSIS — E119 Type 2 diabetes mellitus without complications: Secondary | ICD-10-CM

## 2024-03-02 DIAGNOSIS — E1169 Type 2 diabetes mellitus with other specified complication: Secondary | ICD-10-CM

## 2024-03-02 DIAGNOSIS — N3281 Overactive bladder: Secondary | ICD-10-CM

## 2024-03-02 DIAGNOSIS — Z794 Long term (current) use of insulin: Secondary | ICD-10-CM

## 2024-03-02 DIAGNOSIS — E1142 Type 2 diabetes mellitus with diabetic polyneuropathy: Secondary | ICD-10-CM

## 2024-03-02 DIAGNOSIS — I739 Peripheral vascular disease, unspecified: Secondary | ICD-10-CM

## 2024-03-02 DIAGNOSIS — M545 Low back pain, unspecified: Secondary | ICD-10-CM

## 2024-03-02 DIAGNOSIS — R600 Localized edema: Secondary | ICD-10-CM

## 2024-03-02 LAB — CMP14+EGFR
ALT: 23 IU/L (ref 0–44)
AST: 28 IU/L (ref 0–40)
Albumin: 4.8 g/dL — ABNORMAL HIGH (ref 3.7–4.7)
Alkaline Phosphatase: 78 IU/L (ref 44–121)
BUN/Creatinine Ratio: 20 (ref 10–24)
BUN: 23 mg/dL (ref 8–27)
Bilirubin Total: 0.3 mg/dL (ref 0.0–1.2)
CO2: 25 mmol/L (ref 20–29)
Calcium: 10 mg/dL (ref 8.6–10.2)
Chloride: 97 mmol/L (ref 96–106)
Creatinine, Ser: 1.13 mg/dL (ref 0.76–1.27)
Globulin, Total: 2.4 g/dL (ref 1.5–4.5)
Glucose: 138 mg/dL — ABNORMAL HIGH (ref 70–99)
Potassium: 4 mmol/L (ref 3.5–5.2)
Sodium: 138 mmol/L (ref 134–144)
Total Protein: 7.2 g/dL (ref 6.0–8.5)
eGFR: 63 mL/min/{1.73_m2} (ref >59)

## 2024-03-02 LAB — BAYER DCA HB A1C WAIVED: HB A1C (BAYER DCA - WAIVED): 6.8 % — ABNORMAL HIGH (ref 4.8–5.6)

## 2024-03-02 MED ORDER — TRAMADOL HCL 50 MG PO TABS
50.0000 mg | ORAL_TABLET | Freq: Three times a day (TID) | ORAL | 2 refills | Status: DC | PRN
Start: 2024-03-02 — End: 2024-04-30

## 2024-03-02 MED ORDER — AMLODIPINE BESYLATE 10 MG PO TABS: 10.0000 mg | ORAL_TABLET | Freq: Every day | ORAL | 0 refills | Status: DC

## 2024-03-02 MED ORDER — METFORMIN HCL ER 500 MG PO TB24
ORAL_TABLET | ORAL | 0 refills | Status: DC
Start: 2024-03-02 — End: 2024-10-01

## 2024-03-02 MED ORDER — DULOXETINE HCL 60 MG PO CPEP: 60.0000 mg | ORAL_CAPSULE | Freq: Every day | ORAL | 1 refills | Status: DC

## 2024-03-02 MED ORDER — DIAZEPAM 5 MG PO TABS: 5.0000 mg | ORAL_TABLET | Freq: Two times a day (BID) | ORAL | 2 refills | Status: DC | PRN

## 2024-03-02 MED ORDER — MELOXICAM 7.5 MG PO TABS: 7.5000 mg | ORAL_TABLET | Freq: Every day | ORAL | 0 refills | Status: DC

## 2024-03-02 MED ORDER — FINASTERIDE 5 MG PO TABS: 5.0000 mg | ORAL_TABLET | Freq: Every day | ORAL | 0 refills | Status: DC

## 2024-03-02 MED ORDER — LOSARTAN POTASSIUM 100 MG PO TABS: 100.0000 mg | ORAL_TABLET | Freq: Every day | ORAL | 0 refills | Status: DC

## 2024-03-02 MED ORDER — ATORVASTATIN CALCIUM 20 MG PO TABS
20.0000 mg | ORAL_TABLET | Freq: Every day | ORAL | 3 refills | Status: AC
Start: 2024-03-02 — End: ?

## 2024-03-02 MED ORDER — GABAPENTIN 300 MG PO CAPS: 300.0000 mg | ORAL_CAPSULE | Freq: Two times a day (BID) | ORAL | 0 refills | Status: DC

## 2024-03-02 MED ORDER — TOUJEO MAX SOLOSTAR 300 UNIT/ML ~~LOC~~ SOPN: 10.0000 [IU] | PEN_INJECTOR | Freq: Every day | SUBCUTANEOUS | 0 refills | Status: DC

## 2024-03-02 MED ORDER — OXYBUTYNIN CHLORIDE ER 5 MG PO TB24: 5.0000 mg | ORAL_TABLET | Freq: Every day | ORAL | 1 refills | Status: DC
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
## 2024-03-02 NOTE — Patient Instructions (Signed)

## 2024-03-26 ENCOUNTER — Other Ambulatory Visit: Payer: Self-pay | Admitting: Family

## 2024-03-26 DIAGNOSIS — E559 Vitamin D deficiency, unspecified: Secondary | ICD-10-CM

## 2024-03-29 ENCOUNTER — Ambulatory Visit: Admitting: Orthopaedic Surgery

## 2024-03-31 ENCOUNTER — Telehealth: Payer: Self-pay

## 2024-03-31 NOTE — Telephone Encounter (Signed)
 Copied from CRM [id number]. Topic: Appointments - Appointment Info/Confirmation >> Mar 31, 2024  2:10 PM [name] R wrote: Patient has an appointment next week for hip replacement and needs to know if he should come in and be seen by Dr. Lendon Colonel before the operation. Asking if the doctor or nurse can follow up with him.

## 2024-04-01 NOTE — Telephone Encounter (Signed)
 Patient aware and verbalized understanding.

## 2024-04-01 NOTE — Telephone Encounter (Signed)
 Patient can keep Ortho appointment. They should fax any needed forms to me. (More than likely only getting an injection.)

## 2024-04-07 ENCOUNTER — Ambulatory Visit (INDEPENDENT_AMBULATORY_CARE_PROVIDER_SITE_OTHER): Admitting: Orthopaedic Surgery

## 2024-04-07 VITALS — Ht 69.0 in | Wt 207.0 lb

## 2024-04-07 DIAGNOSIS — M1612 Unilateral primary osteoarthritis, left hip: Secondary | ICD-10-CM

## 2024-04-07 NOTE — Progress Notes (Signed)
 The patient is an 89 year old gentleman sent to me from my partner Dr. Ophelia Charter to discuss hip replacement surgery for the patient's left hip.  He has been dealing with left hip pain for well over a year now.  He has x-rays on the canopy system that I reviewed with him today and it shows complete loss of his left hip joint space with bone-on-bone wear as well as cystic changes and sclerotic changes.  There is flattening of the femoral head.  He reports severe debilitating left hip pain that is 10 out of 10.  He is having to ambulate with a cane.  Is affecting his posture and his back.  He is a diabetic but his last hemoglobin A1c was 6.4.  He does have high blood pressure.  He sees primary care physicians on a regular basis.  He is interested in hip replacement surgery.  At this point he even has a harder time putting his shoes and socks on the left side with no issues on the right side.  Examination of his left hip shows essentially with no internal and external rotation.  There is a leg length difference with the left side shorter than the right.  His right hip moves smoothly and fluidly.  We did go over his x-rays showing the severe debilitating arthritis of his left hip.  We then talked about hip replacement surgery.  I discussed the risks and benefits of the surgery and what to expect from an intraoperative and postoperative standpoint.  We went over a hip replacement model and I gave him a handout about hip replacement surgery.  He is interested in being scheduled in the near future so we will work on getting him scheduled.  All questions and concerns were addressed and answered.

## 2024-04-21 ENCOUNTER — Telehealth: Payer: Self-pay

## 2024-04-21 NOTE — Telephone Encounter (Signed)
 Pt aware to call ortho to see if they can get his surgery scheduled.

## 2024-04-21 NOTE — Telephone Encounter (Signed)
 Copied from CRM [id number]. Topic: General - Other >> Apr 21, 2024  9:53 AM Tiffany S wrote: Reason for CRM: Patient called he stated he has not been able to get in contact with the orthopedic surgeon patient states he is having hip pain but he is trying to find out when he will have surgery please follow up with patient

## 2024-04-27 ENCOUNTER — Telehealth: Payer: Self-pay | Admitting: Family

## 2024-04-27 DIAGNOSIS — L84 Corns and callosities: Secondary | ICD-10-CM | POA: Diagnosis not present

## 2024-04-27 DIAGNOSIS — M79674 Pain in right toe(s): Secondary | ICD-10-CM | POA: Diagnosis not present

## 2024-04-27 DIAGNOSIS — B351 Tinea unguium: Secondary | ICD-10-CM | POA: Diagnosis not present

## 2024-04-27 DIAGNOSIS — M79675 Pain in left toe(s): Secondary | ICD-10-CM | POA: Diagnosis not present

## 2024-04-27 DIAGNOSIS — E1142 Type 2 diabetes mellitus with diabetic polyneuropathy: Secondary | ICD-10-CM | POA: Diagnosis not present

## 2024-04-27 NOTE — Telephone Encounter (Signed)
 Per referral notes, the office made 3 attempts in January to contact patient to get him scheduled but they were unsuccessful in speaking to patient. The referral was closed.  Pt needs new referral placed. Please advise.  Pt will need the office phone # so that he can call and make his appt. That is the patients responsibility.   Copied from CRM [id number]. Topic: Clinical - Medical Advice >> Apr 27, 2024  3:43 PM DeAngela L wrote: Reason for CRM: Patient calling to ask if Dr Marylin So could help him get an appt scheduled to have a hip surgery, he was given a number to call left a vm and he still has never received a call back to get scheduled and he can't walk or get around and the patient is concerned and would like someone to help him reach out to the office so he can get scheduled  Patient number [phone]

## 2024-04-29 ENCOUNTER — Telehealth: Payer: Self-pay

## 2024-04-29 NOTE — Telephone Encounter (Signed)
 Copied from CRM [id number]. Topic: General - Other >> Apr 29, 2024  2:03 PM Blair Bumpers wrote: Reason for CRM: Patient wants to know if he needs to be seen by Dr. Marylin So before he goes to have his surgery. He states the surgery is suppose to take place towards the end of this month. Please give patient a call to advise. He states he's also worried about who will be able to assist him and take care of the house while he is recovering from hip replacement surgery. CB #: [phone].

## 2024-04-30 ENCOUNTER — Telehealth: Payer: Self-pay | Admitting: Orthopaedic Surgery

## 2024-04-30 ENCOUNTER — Other Ambulatory Visit: Payer: Self-pay | Admitting: Orthopaedic Surgery

## 2024-04-30 MED ORDER — TRAMADOL HCL 50 MG PO TABS: 50.0000 mg | ORAL_TABLET | Freq: Three times a day (TID) | ORAL | 0 refills | Status: DC | PRN

## 2024-04-30 NOTE — Telephone Encounter (Signed)
 Mailbox full 5/2 LS

## 2024-04-30 NOTE — Telephone Encounter (Signed)
 Patient request something for severe hip pain   [facility] [address] [place] in [place]  Pt's call back number [phone]

## 2024-04-30 NOTE — Telephone Encounter (Signed)
 Spoke to pt. Pt is confused, states "some dumb person from our office called about his surgery. He already spoke to [place] and they have everything figured out" declined visit that was offered. LS

## 2024-05-05 ENCOUNTER — Telehealth: Payer: Self-pay

## 2024-05-05 ENCOUNTER — Encounter: Payer: Self-pay | Admitting: Family Medicine

## 2024-05-05 ENCOUNTER — Ambulatory Visit

## 2024-05-05 VITALS — BP 166/66 | HR 105 | Wt 219.0 lb

## 2024-05-05 DIAGNOSIS — R600 Localized edema: Secondary | ICD-10-CM

## 2024-05-05 MED ORDER — TORSEMIDE 20 MG PO TABS: 20.0000 mg | ORAL_TABLET | Freq: Every day | ORAL | 0 refills | Status: DC

## 2024-05-05 NOTE — Telephone Encounter (Signed)
 Copied from CRM [id number]. Topic: Clinical - Medication Question >> May 05, 2024 12:56 PM Everlene Hobby D wrote: Patient says his prescription has a star beside it and he states the prescription that was sent in he currently takes for his swollen legs. Patient wants to know the reason he takes his medication for because he don't know.

## 2024-05-05 NOTE — Progress Notes (Signed)
 BP (!) 166/66   Pulse (!) 105   Wt 219 lb (99.3 kg)   SpO2 98%   BMI 32.34 kg/m    Subjective:   Patient ID: [name], male    DOB: Dec 28, 1933, 89 y.o.   MRN: [id number]  HPI: [name] is a 89 y.o. male presenting on 05/05/2024 for Edema (BLE)   HPI Patient is coming in today with bilateral lower extremity swelling.  He says this has been increasing over the past week or 2.  He says he has had this before but is definitely picked up.  He says it has been swelling to the point where it is painful as well.  It is both lower extremities.  He does have a fluid pill and he is prescribed medicines but he cannot recall if he takes it or what he takes.  He denies any shortness of breath or wheezing or difficulty with chest pains or difficulty breathing while walking.  Relevant past medical, surgical, family and social history reviewed and updated as indicated. Interim medical history since our last visit reviewed. Allergies and medications reviewed and updated.  Review of Systems  Constitutional:  Negative for chills and fever.  Eyes:  Negative for visual disturbance.  Respiratory:  Negative for shortness of breath and wheezing.   Cardiovascular:  Positive for leg swelling. Negative for chest pain and palpitations.  Musculoskeletal:  Negative for back pain and gait problem.  Skin:  Negative for rash.  All other systems reviewed and are negative.   Per HPI unless specifically indicated above   Allergies as of 05/05/2024       Reactions   Diclofenac     Flexeril [cyclobenzaprine]    Guaifenesin Er    Believes that it makes him "black out"        Medication List        Accurate as of May 05, 2024 10:02 AM. If you have any questions, ask your nurse or doctor.          Accu-Chek Guide test strip Generic drug: glucose blood USE TO TEST SUGAR TWICE DAILY DX E11.9   amLODipine  10 MG tablet Commonly known as: NORVASC  Take 1 tablet (10 mg total) by mouth daily.    aspirin  81 MG tablet Take 81 mg by mouth daily. Takes EC aspirin    atorvastatin  20 MG tablet Commonly known as: LIPITOR Take 1 tablet (20 mg total) by mouth daily.   baclofen  10 MG tablet Commonly known as: LIORESAL  TAKE 1/2 TABLET (5 MG) BY MOUTH 3 TIMES DAILY   BD Pen Needle Nano 2nd Gen 32G X 4 MM Misc Generic drug: Insulin  Pen Needle USE TO CHECK SUGAR 4 TIMES DAILY AS NEEDED DX E11.69   blood glucose meter kit and supplies Dispense per insurance preference. Use up to four times daily as directed. E 11.9   Blood Glucose Monitor System w/Device Kit TEST BS BID Dx E11.9   diazepam  5 MG tablet Commonly known as: VALIUM  Take 1 tablet (5 mg total) by mouth 2 (two) times daily as needed.   diclofenac  Sodium 1 % Gel Commonly known as: Voltaren  Apply 2 g topically 4 (four) times daily.   DULoxetine  60 MG capsule Commonly known as: Cymbalta  Take 1 capsule (60 mg total) by mouth daily.   finasteride  5 MG tablet Commonly known as: PROSCAR  Take 1 tablet (5 mg total) by mouth at bedtime. For prostate   gabapentin  300 MG capsule Commonly known as: NEURONTIN  Take 1 capsule (300  mg total) by mouth 2 (two) times daily.   latanoprost  0.005 % ophthalmic solution Commonly known as: XALATAN  1 drop daily.   losartan  100 MG tablet Commonly known as: COZAAR  Take 1 tablet (100 mg total) by mouth daily.   meloxicam  7.5 MG tablet Commonly known as: MOBIC  Take 1 tablet (7.5 mg total) by mouth daily.   metFORMIN  500 MG 24 hr tablet Commonly known as: GLUCOPHAGE -XR TAKE 1 TABLET BY MOUTH 3 TIMES DAILY WITH MEALS.   OneTouch Delica Lancets 33G Misc USE 2 TIMES A DAY Dx E11.9   oxybutynin  5 MG 24 hr tablet Commonly known as: DITROPAN -XL Take 1 tablet (5 mg total) by mouth at bedtime.   predniSONE  10 MG tablet Commonly known as: DELTASONE  Take 2 daily for 5 days then stop   tamsulosin  0.4 MG Caps capsule Commonly known as: FLOMAX  TAKE 2 CAPSULES (0.8 MG TOTAL) BY MOUTH AT  BEDTIME. FOR PROSTATE.   torsemide  20 MG tablet Commonly known as: DEMADEX  TAKE 1 TABLET BY MOUTH EVERY DAY What changed: Another medication with the same name was added. Make sure you understand how and when to take each.   torsemide  20 MG tablet Commonly known as: DEMADEX  Take 1 tablet (20 mg total) by mouth daily. Leg swelling, take 1 of these per day on top of your normal fluid pills medicines for 5 days What changed: You were already taking a medication with the same name, and this prescription was added. Make sure you understand how and when to take each.   Toujeo  Max SoloStar 300 UNIT/ML Solostar Pen Generic drug: insulin  glargine (2 Unit Dial ) Inject 10 Units into the skin at bedtime.   traMADol  50 MG tablet Commonly known as: ULTRAM  Take 1-2 tablets (50-100 mg total) by mouth 3 (three) times daily as needed.   Vitamin D  (Ergocalciferol ) 1.25 MG (50000 UNIT) Caps capsule Commonly known as: DRISDOL  TAKE 1 CAPSULE BY MOUTH ON SUNDAY OF EACH WEEK AS DIRECTED         Objective:   BP (!) 166/66   Pulse (!) 105   Wt 219 lb (99.3 kg)   SpO2 98%   BMI 32.34 kg/m   Wt Readings from Last 3 Encounters:  05/05/24 219 lb (99.3 kg)  04/07/24 207 lb (93.9 kg)  03/02/24 207 lb (93.9 kg)    Physical Exam Vitals and nursing note reviewed.  Constitutional:      Appearance: Normal appearance.  Cardiovascular:     Rate and Rhythm: Normal rate and regular rhythm.  Pulmonary:     Effort: Pulmonary effort is normal.     Breath sounds: Normal breath sounds. No wheezing, rhonchi or rales.  Musculoskeletal:        General: Swelling (2+ bilateral lower extremity edema) present.  Neurological:     Mental Status: He is alert.     Nurse to apply [employer] on for a week  Assessment & Plan:   Problem List Items Addressed This Visit   None Visit Diagnoses       Peripheral edema    -  Primary   Relevant Orders   CMP14+EGFR   Brain natriuretic peptide       Recommended to  take an extra torsemide  for 5 days.  Fluid is up in his legs and his weight is up.  He denies any respiratory symptoms and has no history of CHF but we will check a BNP and CMP. Follow up plan: Return in about 1 week (around 05/12/2024), or  if symptoms worsen or fail to improve, for 1 week recheck lower extremity edema and Unna boot.  Counseling provided for all of the vaccine components Orders Placed This Encounter  Procedures   CMP14+EGFR   Brain natriuretic peptide    Jolyne Needs, MD [facility] 05/05/2024, 10:02 AM

## 2024-05-05 NOTE — Telephone Encounter (Signed)
 Pt made aware that Dr. Steen Eden wanted him to take an extra Torsemide  for 5 days. Pt understood to take 2 daily for the next 5d ans keep f/u with Christy next week.

## 2024-05-06 ENCOUNTER — Telehealth: Payer: Self-pay

## 2024-05-06 LAB — CMP14+EGFR
ALT: 13 IU/L (ref 0–44)
AST: 24 IU/L (ref 0–40)
Albumin: 4.9 g/dL — ABNORMAL HIGH (ref 3.7–4.7)
Alkaline Phosphatase: 85 IU/L (ref 44–121)
BUN/Creatinine Ratio: 12 (ref 10–24)
BUN: 9 mg/dL (ref 8–27)
Bilirubin Total: 0.2 mg/dL (ref 0.0–1.2)
CO2: 18 mmol/L — ABNORMAL LOW (ref 20–29)
Calcium: 10.1 mg/dL (ref 8.6–10.2)
Chloride: 101 mmol/L (ref 96–106)
Creatinine, Ser: 0.78 mg/dL (ref 0.76–1.27)
Globulin, Total: 2.3 g/dL (ref 1.5–4.5)
Glucose: 162 mg/dL — ABNORMAL HIGH (ref 70–99)
Potassium: 3.9 mmol/L (ref 3.5–5.2)
Sodium: 139 mmol/L (ref 134–144)
Total Protein: 7.2 g/dL (ref 6.0–8.5)
eGFR: 86 mL/min/{1.73_m2} (ref >59)

## 2024-05-06 LAB — BRAIN NATRIURETIC PEPTIDE: BNP: 31.5 pg/mL (ref 0.0–100.0)

## 2024-05-06 NOTE — Telephone Encounter (Signed)
 Copied from CRM [id number]. Topic: Clinical - Prescription Issue >> May 06, 2024  1:16 PM Adrianna P wrote: Reason for CRM: Patient has been taking torsemide  (DEMADEX ) 20 MG tablet  for a long time and has never been charged for it, but when he recently picked up his new prescription they charged him for it

## 2024-05-06 NOTE — Telephone Encounter (Signed)
 Spoke with pt , advised him to call insurance

## 2024-05-07 ENCOUNTER — Other Ambulatory Visit: Payer: Self-pay | Admitting: Family Medicine

## 2024-05-11 NOTE — Telephone Encounter (Signed)
 Please give him number for Ortho and let him call and schedule the appointment. They have called him 3 times and were not successful.

## 2024-05-11 NOTE — Telephone Encounter (Signed)
 Contacted patient and gave him the number for orth[facility]are in [place]

## 2024-05-13 ENCOUNTER — Ambulatory Visit (INDEPENDENT_AMBULATORY_CARE_PROVIDER_SITE_OTHER): Admitting: Family

## 2024-05-13 ENCOUNTER — Encounter: Payer: Self-pay | Admitting: Family

## 2024-05-13 VITALS — BP 136/74 | HR 90 | Temp 99.8°F | Wt 214.0 lb

## 2024-05-13 DIAGNOSIS — I10 Essential (primary) hypertension: Secondary | ICD-10-CM | POA: Diagnosis not present

## 2024-05-13 DIAGNOSIS — R600 Localized edema: Secondary | ICD-10-CM | POA: Diagnosis not present

## 2024-05-13 NOTE — Patient Instructions (Signed)
 Peripheral Edema  Peripheral edema is swelling that is caused by a buildup of fluid. Peripheral edema most often affects the lower legs, ankles, and feet. It can also develop in the arms, hands, and face. The area of the body that has peripheral edema will look swollen. It may also feel heavy or warm. Your clothes may start to feel tight. Pressing on the area may make a temporary dent in your skin (pitting edema). You may not be able to move your swollen arm or leg as much as usual. There are many causes of peripheral edema. It can happen because of a complication of other conditions such as heart failure, kidney disease, or a problem with your circulation. It also can be a side effect of certain medicines or happen because of an infection. It often happens to women during pregnancy. Sometimes, the cause is not known. Follow these instructions at home: Managing pain, stiffness, and swelling  Raise (elevate) your legs while you are sitting or lying down. Move around often to prevent stiffness and to reduce swelling. Do not sit or stand for long periods of time. Do not wear tight clothing. Do not wear garters on your upper legs. Exercise your legs to get your circulation going. This helps to move the fluid back into your blood vessels, and it may help the swelling go down. Wear compression stockings as told by your health care provider. These stockings help to prevent blood clots and reduce swelling in your legs. It is important that these are the correct size. These stockings should be prescribed by your doctor to prevent possible injuries. If elastic bandages or wraps are recommended, use them as told by your health care provider. Medicines Take over-the-counter and prescription medicines only as told by your health care provider. Your health care provider may prescribe medicine to help your body get rid of excess water (diuretic). Take this medicine if you are told to take it. General  instructions Eat a low-salt (low-sodium) diet as told by your health care provider. Sometimes, eating less salt may reduce swelling. Pay attention to any changes in your symptoms. Moisturize your skin daily to help prevent skin from cracking and draining. Keep all follow-up visits. This is important. Contact a health care provider if: You have a fever. You have swelling in only one leg. You have increased swelling, redness, or pain in one or both of your legs. You have drainage or sores at the area where you have edema. Get help right away if: You have edema that starts suddenly or is getting worse, especially if you are pregnant or have a medical condition. You develop shortness of breath, especially when you are lying down. You have pain in your chest or abdomen. You feel weak. You feel like you will faint. These symptoms may be an emergency. Get help right away. Call 911. Do not wait to see if the symptoms will go away. Do not drive yourself to the hospital. Summary Peripheral edema is swelling that is caused by a buildup of fluid. Peripheral edema most often affects the lower legs, ankles, and feet. Move around often to prevent stiffness and to reduce swelling. Do not sit or stand for long periods of time. Pay attention to any changes in your symptoms. Contact a health care provider if you have edema that starts suddenly or is getting worse, especially if you are pregnant or have a medical condition. Get help right away if you develop shortness of breath, especially when lying down.  This information is not intended to replace advice given to you by your health care provider. Make sure you discuss any questions you have with your health care provider. Document Revised: 08/20/2021 Document Reviewed: 08/20/2021 Elsevier Patient Education  2024 [employer].

## 2024-05-13 NOTE — Progress Notes (Signed)
 Subjective:    Patient ID: [name], male    DOB: May 23, 1933, 89 y.o.   MRN: [id number]  Chief Complaint  Patient presents with   Follow-up    Unna boots    Pt presents to the office today to follow up on peripheral edema. He was seen on 005/07/25 and had bilateral unna boots and his torsemide  was increased to 40 mg for 5 days. His weight is down 5 lbs.       05/13/2024   12:14 PM 05/05/2024    9:19 AM 04/07/2024    3:39 PM  Last 3 Weights  Weight (lbs) 214 lb 219 lb 207 lb  Weight (kg) 97.07 kg 99.338 kg 93.895 kg     Hypertension This is a chronic problem. The current episode started more than 1 year ago. The problem has been resolved since onset. The problem is controlled. Associated symptoms include malaise/fatigue and peripheral edema. The current treatment provides moderate improvement.      Review of Systems  Constitutional:  Positive for malaise/fatigue.  All other systems reviewed and are negative.   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 Reduced social interaction with others. Reduced interaction with family members   Social Drivers of Corporate investment banker Strain: Low Risk  (05/27/2023)    Overall Financial Resource Strain (CARDIA)    Difficulty of Paying Living Expenses: Not hard at all  Food Insecurity: No Food Insecurity (05/27/2023)   Hunger Vital Sign    Worried About Running Out of Food in the Last Year: Never true    Ran Out of Food in the Last Year: Never true  Transportation Needs: No Transportation Needs (05/27/2023)   PRAPARE - Administrator, Civil Service (Medical): No    Lack of Transportation (Non-Medical): No  Physical Activity: Insufficiently Active (05/27/2023)   Exercise Vital Sign    Days of Exercise per Week: 3 days    Minutes of Exercise per Session: 30 min  Stress: No Stress Concern Present (05/27/2023)   [employer] of Occupational Health - Occupational Stress Questionnaire    Feeling of Stress : Not at all  Social Connections: Moderately Isolated (05/27/2023)   Social Connection and Isolation Panel [NHANES]    Frequency of Communication with Friends and Family: More than three times a week    Frequency of Social Gatherings with Friends and Family: More than three times a week    Attends Religious Services: 1 to 4 times per year    Active Member of Clubs or Organizations: No  Attends Banker Meetings: Never    Marital Status: Widowed   Family History  Problem Relation Age of Onset   Diabetes Father    Diabetes Sister    Diabetes Brother    Cancer Brother    Cancer Brother    Heart disease Sister         Objective:   Physical Exam Vitals reviewed.  Constitutional:      General: He is not in acute distress.    Appearance: He is well-developed.  HENT:     Head: Normocephalic.  Eyes:     General:        Right eye: No discharge.        Left eye: No discharge.     Pupils: Pupils are equal, round, and reactive to light.  Neck:     Thyroid : No thyromegaly.  Cardiovascular:     Rate and Rhythm: Normal rate and regular rhythm.     Heart sounds: Normal heart sounds. No murmur heard. Pulmonary:      Effort: Pulmonary effort is normal. No respiratory distress.     Breath sounds: Normal breath sounds. No wheezing.  Abdominal:     General: Bowel sounds are normal. There is no distension.     Palpations: Abdomen is soft.     Tenderness: There is no abdominal tenderness.  Musculoskeletal:        General: No tenderness. Normal range of motion.     Cervical back: Normal range of motion and neck supple.  Skin:    General: Skin is warm and dry.     Findings: No erythema or rash.  Neurological:     Mental Status: He is alert and oriented to person, place, and time.     Cranial Nerves: No cranial nerve deficit.     Deep Tendon Reflexes: Reflexes are normal and symmetric.  Psychiatric:        Behavior: Behavior normal.        Thought Content: Thought content normal.        Judgment: Judgment normal.       BP 136/74   Pulse 90   Temp 99.8 F (37.7 C) (Temporal)   Wt 214 lb (97.1 kg)   SpO2 96%   BMI 31.60 kg/m      Assessment & Plan:  [name] comes in today with chief complaint of Follow-up (Unna boots )   Diagnosis and orders addressed:  1. Primary hypertension (Primary) At goal  - BMP8+EGFR  2. Peripheral edema Greatly improved  Low salt diet  Keep legs elevated  Wear compression hose - BMP8+EGFR    Tommas Fragmin, FNP

## 2024-05-13 NOTE — Patient Instructions (Signed)
 SURGICAL WAITING ROOM VISITATION  Patients having surgery or a procedure may have no more than 2 support people in the waiting area - these visitors may rotate.    Children under the age of 77 must have an adult with them who is not the patient.  Due to an increase in RSV and influenza rates and associated hospitalizations, children ages 34 and under may not visit patients in [facility] hospitals.  Visitors with respiratory illnesses are discouraged from visiting and should remain at home.  If the patient needs to stay at the hospital during part of their recovery, the visitor guidelines for inpatient rooms apply. Pre-op nurse will coordinate an appropriate time for 1 support person to accompany patient in pre-op.  This support person may not rotate.    Please refer to the [facility] website for the visitor guidelines for Inpatients (after your surgery is over and you are in a regular room).       Your procedure is scheduled on: 05/28/24   Report to [facility] Main Entrance    Report to admitting at : 10:30 AM   Call this number if you have problems the morning of surgery [phone]   Do not eat food :After Midnight.   After Midnight you may have the following liquids until : 10:00 AM DAY OF SURGERY  Water  Non-Citrus Juices (without pulp, NO RED-Apple, White grape, White cranberry) Black Coffee (NO MILK/CREAM OR CREAMERS, sugar ok)  Clear Tea (NO MILK/CREAM OR CREAMERS, sugar ok) regular and decaf                             Plain Jell-O (NO RED)                                           Fruit ices (not with fruit pulp, NO RED)                                     Popsicles (NO RED)                                                               Sports drinks like Gatorade (NO RED)   The day of surgery:  Drink ONE (1) Pre-Surgery Clear G2 at : 10:00 AM the morning of surgery. Drink in one sitting. Do not sip.  This drink was given to you during your hospital  pre-op  appointment visit. Nothing else to drink after completing the  Pre-Surgery Clear Ensure or G2.          If you have questions, please contact your surgeon's office.  FOLLOW ANY ADDITIONAL PRE OP INSTRUCTIONS YOU RECEIVED FROM YOUR SURGEON'S OFFICE!!!   Oral Hygiene is also important to reduce your risk of infection.                                    Remember - BRUSH YOUR TEETH THE MORNING OF SURGERY WITH YOUR REGULAR TOOTHPASTE  DENTURES WILL BE REMOVED PRIOR TO SURGERY PLEASE DO NOT APPLY "Poly grip" OR ADHESIVES!!!   Do NOT smoke after Midnight   Stop all vitamins and herbal supplements 7 days before surgery.   Take these medicines the morning of surgery with A SIP OF WATER : gabapentin ,duloxetine ,amlodipine ,torsemide .  How to Manage Your Diabetes Before and After Surgery  Why is it important to control my blood sugar before and after surgery? Improving blood sugar levels before and after surgery helps healing and can limit problems. A way of improving blood sugar control is eating a healthy diet by:  Eating less sugar and carbohydrates  Increasing activity/exercise  Talking with your doctor about reaching your blood sugar goals High blood sugars (greater than 180 mg/dL) can raise your risk of infections and slow your recovery, so you will need to focus on controlling your diabetes during the weeks before surgery. Make sure that the doctor who takes care of your diabetes knows about your planned surgery including the date and location.  How do I manage my blood sugar before surgery? Check your blood sugar at least 4 times a day, starting 2 days before surgery, to make sure that the level is not too high or low. Check your blood sugar the morning of your surgery when you wake up and every 2 hours until you get to the Short Stay unit. If your blood sugar is less than 70 mg/dL, you will need to treat for low blood sugar: Do not take insulin . Treat a low blood sugar (less than 70  mg/dL) with  cup of clear juice (cranberry or apple), 4 glucose tablets, OR glucose gel. Recheck blood sugar in 15 minutes after treatment (to make sure it is greater than 70 mg/dL). If your blood sugar is not greater than 70 mg/dL on recheck, call [phone] for further instructions. Report your blood sugar to the short stay nurse when you get to Short Stay.  If you are admitted to the hospital after surgery: Your blood sugar will be checked by the staff and you will probably be given insulin  after surgery (instead of oral diabetes medicines) to make sure you have good blood sugar levels. The goal for blood sugar control after surgery is 80-180 mg/dL.   WHAT DO I DO ABOUT MY DIABETES MEDICATION?  THE NIGHT BEFORE SURGERY, take ONLY half of insulin  dose.      THE MORNING OF SURGERY :.DO NOT TAKE ANY ORAL DIABETIC MEDICATIONS DAY OF YOUR SURGERY  DO NOT TAKE THE FOLLOWING 7 DAYS PRIOR TO SURGERY: Ozempic, Wegovy, Rybelsus (Semaglutide), Byetta (exenatide), Bydureon (exenatide ER), Victoza, Saxenda (liraglutide), or Trulicity (dulaglutide) Mounjaro (Tirzepatide) Adlyxin (Lixisenatide), Polyethylene Glycol Loxenatide.                              You may not have any metal on your body including hair pins, jewelry, and body piercing             Do not wear  lotions, powders, perfumes/cologne, or deodorant              Men may shave face and neck.   Do not bring valuables to the hospital. [facility] IS NOT             RESPONSIBLE   FOR VALUABLES.   Contacts, glasses, dentures or bridgework may not be worn into surgery.   Bring small overnight bag day of surgery.   DO NOT BRING  YOUR HOME MEDICATIONS TO THE HOSPITAL. PHARMACY WILL DISPENSE MEDICATIONS LISTED ON YOUR MEDICATION LIST TO YOU DURING YOUR ADMISSION IN THE HOSPITAL!    Patients discharged on the day of surgery will not be allowed to drive home.  Someone NEEDS to stay with you for the first 24 hours after  anesthesia.   Special Instructions: Bring a copy of your healthcare power of attorney and living will documents the day of surgery if you haven't scanned them before.              Please read over the following fact sheets you were given: IF YOU HAVE QUESTIONS ABOUT YOUR PRE-OP INSTRUCTIONS PLEASE CALL [phone]   If you received a COVID test during your pre-op visit  it is requested that you wear a mask when out in public, stay away from anyone that may not be feeling well and notify your surgeon if you develop symptoms. If you test positive for Covid or have been in contact with anyone that has tested positive in the last 10 days please notify you surgeon.      Pre-operative 5 CHG Bath Instructions   You can play a key role in reducing the risk of infection after surgery. Your skin needs to be as free of germs as possible. You can reduce the number of germs on your skin by washing with CHG (chlorhexidine gluconate) soap before surgery. CHG is an antiseptic soap that kills germs and continues to kill germs even after washing.   DO NOT use if you have an allergy to chlorhexidine/CHG or antibacterial soaps. If your skin becomes reddened or irritated, stop using the CHG and notify one of our RNs at [phone].   Please shower with the CHG soap starting 4 days before surgery using the following schedule:     Please keep in mind the following:  DO NOT shave, including legs and underarms, starting the day of your first shower.   You may shave your face at any point before/day of surgery.  Place clean sheets on your bed the day you start using CHG soap. Use a clean washcloth (not used since being washed) for each shower. DO NOT sleep with pets once you start using the CHG.   CHG Shower Instructions:  If you choose to wash your hair and private area, wash first with your normal shampoo/soap.  After you use shampoo/soap, rinse your hair and body thoroughly to remove shampoo/soap residue.   Turn the water  OFF and apply about 3 tablespoons (45 ml) of CHG soap to a CLEAN washcloth.  Apply CHG soap ONLY FROM YOUR NECK DOWN TO YOUR TOES (washing for 3-5 minutes)  DO NOT use CHG soap on face, private areas, open wounds, or sores.  Pay special attention to the area where your surgery is being performed.  If you are having back surgery, having someone wash your back for you may be helpful. Wait 2 minutes after CHG soap is applied, then you may rinse off the CHG soap.  Pat dry with a clean towel  Put on clean clothes/pajamas   If you choose to wear lotion, please use ONLY the CHG-compatible lotions on the back of this paper.     Additional instructions for the day of surgery: DO NOT APPLY any lotions, deodorants, cologne, or perfumes.   Put on clean/comfortable clothes.  Brush your teeth.  Ask your nurse before applying any prescription medications to the skin.   CHG Compatible Lotions   Aveeno Moisturizing lotion  Cetaphil Moisturizing Cream  Cetaphil Moisturizing Lotion  Clairol Herbal Essence Moisturizing Lotion, Dry Skin  Clairol Herbal Essence Moisturizing Lotion, Extra Dry Skin  Clairol Herbal Essence Moisturizing Lotion, Normal Skin  Curel Age Defying Therapeutic Moisturizing Lotion with Alpha Hydroxy  Curel Extreme Care Body Lotion  Curel Soothing Hands Moisturizing Hand Lotion  Curel Therapeutic Moisturizing Cream, Fragrance-Free  Curel Therapeutic Moisturizing Lotion, Fragrance-Free  Curel Therapeutic Moisturizing Lotion, Original Formula  Eucerin Daily Replenishing Lotion  Eucerin Dry Skin Therapy Plus Alpha Hydroxy Crme  Eucerin Dry Skin Therapy Plus Alpha Hydroxy Lotion  Eucerin Original Crme  Eucerin Original Lotion  Eucerin Plus Crme Eucerin Plus Lotion  Eucerin TriLipid Replenishing Lotion  Keri Anti-Bacterial Hand Lotion  Keri Deep Conditioning Original Lotion Dry Skin Formula Softly Scented  Keri Deep Conditioning Original Lotion, Fragrance Free  Sensitive Skin Formula  Keri Lotion Fast Absorbing Fragrance Free Sensitive Skin Formula  Keri Lotion Fast Absorbing Softly Scented Dry Skin Formula  Keri Original Lotion  Keri Skin Renewal Lotion Keri Silky Smooth Lotion  Keri Silky Smooth Sensitive Skin Lotion  Nivea Body Creamy Conditioning Oil  Nivea Body Extra Enriched Lotion  Nivea Body Original Lotion  Nivea Body Sheer Moisturizing Lotion Nivea Crme  Nivea Skin Firming Lotion  NutraDerm 30 Skin Lotion  NutraDerm Skin Lotion  NutraDerm Therapeutic Skin Cream  NutraDerm Therapeutic Skin Lotion  ProShield Protective Hand Cream  Provon moisturizing lotion   Incentive Spirometer  An incentive spirometer is a tool that can help keep your lungs clear and active. This tool measures how well you are filling your lungs with each breath. Taking long deep breaths may help reverse or decrease the chance of developing breathing (pulmonary) problems (especially infection) following: A long period of time when you are unable to move or be active. BEFORE THE PROCEDURE  If the spirometer includes an indicator to show your best effort, your nurse or respiratory therapist will set it to a desired goal. If possible, sit up straight or lean slightly forward. Try not to slouch. Hold the incentive spirometer in an upright position. INSTRUCTIONS FOR USE  Sit on the edge of your bed if possible, or sit up as far as you can in bed or on a chair. Hold the incentive spirometer in an upright position. Breathe out normally. Place the mouthpiece in your mouth and seal your lips tightly around it. Breathe in slowly and as deeply as possible, raising the piston or the ball toward the top of the column. Hold your breath for 3-5 seconds or for as long as possible. Allow the piston or ball to fall to the bottom of the column. Remove the mouthpiece from your mouth and breathe out normally. Rest for a few seconds and repeat Steps 1 through 7 at least 10 times  every 1-2 hours when you are awake. Take your time and take a few normal breaths between deep breaths. The spirometer may include an indicator to show your best effort. Use the indicator as a goal to work toward during each repetition. After each set of 10 deep breaths, practice coughing to be sure your lungs are clear. If you have an incision (the cut made at the time of surgery), support your incision when coughing by placing a pillow or rolled up towels firmly against it. Once you are able to get out of bed, walk around indoors and cough well. You may stop using the incentive spirometer when instructed by your caregiver.  RISKS AND COMPLICATIONS Take your time  so you do not get dizzy or light-headed. If you are in pain, you may need to take or ask for pain medication before doing incentive spirometry. It is harder to take a deep breath if you are having pain. AFTER USE Rest and breathe slowly and easily. It can be helpful to keep track of a log of your progress. Your caregiver can provide you with a simple table to help with this. If you are using the spirometer at home, follow these instructions: SEEK MEDICAL CARE IF:  You are having difficultly using the spirometer. You have trouble using the spirometer as often as instructed. Your pain medication is not giving enough relief while using the spirometer. You develop fever of 100.5 F (38.1 C) or higher. SEEK IMMEDIATE MEDICAL CARE IF:  You cough up bloody sputum that had not been present before. You develop fever of 102 F (38.9 C) or greater. You develop worsening pain at or near the incision site. MAKE SURE YOU:  Understand these instructions. Will watch your condition. Will get help right away if you are not doing well or get worse. Document Released: 04/28/2007 Document Revised: 03/09/2012 Document Reviewed: 06/29/2007 [facility] Patient Information 2014 [place],  [place].   ________________________________________________________________________

## 2024-05-14 ENCOUNTER — Encounter (HOSPITAL_COMMUNITY)
Admission: RE | Admit: 2024-05-14 | Discharge: 2024-05-14 | Disposition: A | Discharge status: Home / Self Care | Source: Ambulatory Visit | Attending: Orthopaedic Surgery | Admitting: Orthopaedic Surgery

## 2024-05-14 ENCOUNTER — Encounter (HOSPITAL_COMMUNITY): Payer: Self-pay

## 2024-05-14 ENCOUNTER — Other Ambulatory Visit: Payer: Self-pay

## 2024-05-14 ENCOUNTER — Ambulatory Visit: Payer: Self-pay | Admitting: Family

## 2024-05-14 VITALS — BP 149/64 | HR 75 | Temp 98.1°F | Ht 69.0 in | Wt 216.1 lb

## 2024-05-14 DIAGNOSIS — I10 Essential (primary) hypertension: Secondary | ICD-10-CM | POA: Diagnosis not present

## 2024-05-14 DIAGNOSIS — E119 Type 2 diabetes mellitus without complications: Secondary | ICD-10-CM | POA: Diagnosis not present

## 2024-05-14 DIAGNOSIS — Z794 Long term (current) use of insulin: Secondary | ICD-10-CM | POA: Insufficient documentation

## 2024-05-14 DIAGNOSIS — M1612 Unilateral primary osteoarthritis, left hip: Secondary | ICD-10-CM | POA: Diagnosis not present

## 2024-05-14 DIAGNOSIS — Z01818 Encounter for other preprocedural examination: Secondary | ICD-10-CM | POA: Insufficient documentation

## 2024-05-14 HISTORY — DX: Unspecified osteoarthritis, unspecified site: M19.90

## 2024-05-14 LAB — SURGICAL PCR SCREEN
MRSA, PCR: NEGATIVE
Staphylococcus aureus: NEGATIVE

## 2024-05-14 LAB — BMP8+EGFR
BUN/Creatinine Ratio: 14 (ref 10–24)
BUN: 14 mg/dL (ref 8–27)
CO2: 22 mmol/L (ref 20–29)
Calcium: 10.2 mg/dL (ref 8.6–10.2)
Chloride: 98 mmol/L (ref 96–106)
Creatinine, Ser: 0.98 mg/dL (ref 0.76–1.27)
Glucose: 161 mg/dL — ABNORMAL HIGH (ref 70–99)
Potassium: 4.1 mmol/L (ref 3.5–5.2)
Sodium: 138 mmol/L (ref 134–144)
eGFR: 74 mL/min/{1.73_m2} (ref >59)

## 2024-05-14 LAB — CBC
HCT: 41.3 % (ref 39.0–52.0)
Hemoglobin: 13.9 g/dL (ref 13.0–17.0)
MCH: 30.8 pg (ref 26.0–34.0)
MCHC: 33.7 g/dL (ref 30.0–36.0)
MCV: 91.4 fL (ref 80.0–100.0)
Platelets: 344 10*3/uL (ref 150–400)
RBC: 4.52 MIL/uL (ref 4.22–5.81)
RDW: 13.3 % (ref 11.5–15.5)
WBC: 8.1 10*3/uL (ref 4.0–10.5)
nRBC: 0 % (ref 0.0–0.2)

## 2024-05-14 LAB — BASIC METABOLIC PANEL WITH GFR
Anion gap: 13 (ref 5–15)
BUN: 16 mg/dL (ref 8–23)
CO2: 24 mmol/L (ref 22–32)
Calcium: 10 mg/dL (ref 8.9–10.3)
Chloride: 103 mmol/L (ref 98–111)
Creatinine, Ser: 0.89 mg/dL (ref 0.61–1.24)
GFR, Estimated: >60 mL/min (ref >60)
Glucose, Bld: 147 mg/dL — ABNORMAL HIGH (ref 70–99)
Potassium: 4 mmol/L (ref 3.5–5.1)
Sodium: 140 mmol/L (ref 135–145)

## 2024-05-14 LAB — GLUCOSE, CAPILLARY: Glucose-Capillary: 166 mg/dL — ABNORMAL HIGH (ref 70–99)

## 2024-05-14 NOTE — Progress Notes (Signed)
 For Anesthesia: PCP - Yevette Hem, FNP . LOV: 05/13/24 Cardiologist - N/A  Bowel Prep reminder:  Chest x-ray - 05/18/23 EKG - 05/14/24 Stress Test -  ECHO - 02/27/12 Cardiac Cath -  Pacemaker/ICD device last checked: Pacemaker orders received: Device Rep notified:  Spinal Cord Stimulator:N/A  Sleep Study - N/A CPAP -   Fasting Blood Sugar - 140's Checks Blood Sugar ___2__ times a day Date and result of last Hgb A1c-6.8: 03/02/24  Last dose of GLP1 agonist- N/A GLP1 instructions:   Last dose of SGLT-2 inhibitors- N/A SGLT-2 instructions:   Blood Thinner Instructions:N/A  Aspirin  Instructions:NONE Last Dose:  Activity level: Can go up a flight of stairs and activities of daily living without stopping and without chest pain and/or shortness of breath Unable to go up a flight of stairs  due to hip pain    Anesthesia review: Hx: HTN,DIA,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  Patient denies shortness of breath, fever, cough and chest pain at PAT appointment   Patient verbalized understanding of instructions that were given to them at the PAT appointment. Patient was also instructed that they will need to review over the PAT instructions again at home before surgery.

## 2024-05-17 ENCOUNTER — Telehealth: Payer: Self-pay

## 2024-05-17 NOTE — Telephone Encounter (Signed)
 Copied from CRM [id number]. Topic: Clinical - Lab/Test Results >> May 17, 2024 11:46 AM Brynn Caras wrote: Reason for CRM: The patient called in regards to blood work completed during his OV on 05/15. Relayed the results verbatim as requested. PT has no further questions.

## 2024-05-25 ENCOUNTER — Telehealth: Payer: Self-pay

## 2024-05-25 NOTE — Telephone Encounter (Signed)
 Patient aware what appt is for. He will keep it

## 2024-05-25 NOTE — Telephone Encounter (Signed)
 Copied from CRM [id number]. Topic: Appointments - Appointment Cancel/Reschedule >> May 25, 2024  9:22 AM [name] wrote: Patient was returning a call but no vm was left, I explained to the patient that it could be an appointment reminder about his upcoming appointment, patient also states he does not know what the appointment is for and after explaining 3 times patient still wants to speak to someone directly in the clinic, patients callback number is [phone]. He said he does not want to cancel his appointment until someone from the clinic can call him back and explain

## 2024-05-26 ENCOUNTER — Telehealth: Payer: Self-pay | Admitting: Radiology

## 2024-05-26 ENCOUNTER — Encounter: Payer: Self-pay | Admitting: *Deleted

## 2024-05-26 NOTE — Telephone Encounter (Signed)
 Spoke with [name] with [facility] about cold therapy hip wrap.   Device will be delivered to our office for patient to pick up.  They cannot deliver device to a PO BOX which is listed for patient.

## 2024-05-27 ENCOUNTER — Telehealth: Payer: Self-pay

## 2024-05-27 ENCOUNTER — Ambulatory Visit: Payer: Medicare Other

## 2024-05-27 ENCOUNTER — Telehealth: Payer: Self-pay | Admitting: *Deleted

## 2024-05-27 VITALS — BP 136/74 | HR 90 | Ht 69.0 in | Wt 214.0 lb

## 2024-05-27 DIAGNOSIS — Z0000 Encounter for general adult medical examination without abnormal findings: Secondary | ICD-10-CM | POA: Diagnosis not present

## 2024-05-27 NOTE — Telephone Encounter (Signed)
 [facility] RNCM pre-op  call completed.

## 2024-05-27 NOTE — Care Plan (Signed)
 [facility] RNCM call to patient and discussed his upcoming Left total hip arthroplasty with Dr. Lucienne Ryder on 05/28/24 at [facility]. He is agreeable to case management. He lives alone and is not sure he wants to go to a SNF, but informed him for safety, this is the best option because he will need someone with him after surgery. He has a friend that will drive him to surgery, but can't stay with him. He has a RW. Referral made to [facility] after choice provided, but most likely patient's d/c disposition will be to SNF. Reviewed post op instructions. Will continue to follow for needs.

## 2024-05-27 NOTE — Telephone Encounter (Signed)
 Copied from CRM [id number]. Topic: General - Other >> May 27, 2024  8:57 AM Hamp Levine R wrote: Reason for CRM: Patient just had his AWV call and stated he forgot to mention something to the person he was speaking with. Is asking if they could give him a call back to discuss.  Patient can be reached at [phone]
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
## 2024-05-27 NOTE — Patient Instructions (Signed)
 [name] , Thank you for taking time out of your busy schedule to complete your Annual Wellness Visit with me. I enjoyed our conversation and look forward to speaking with you again next year. I, as well as your care team,  appreciate your ongoing commitment to your health goals. Please review the following plan we discussed and let me know if I can assist you in the future. Your Game plan/ To Do List   Follow up Visits: Next Medicare AWV with our clinical staff: 05/30/25 at 8:00a.m. Next Office Visit with your provider: 06/01/24 at 9:25a.m.  Clinician Recommendations:  Aim for 30 minutes of exercise or brisk walking, 6-8 glasses of water , and 5 servings of fruits and vegetables each day. N/a      This is a list of the screening recommended for you and due dates:  Health Maintenance  Topic Date Due   COVID-19 Vaccine (3 - Moderna risk series) 12/14/2020   Complete foot exam   03/10/2024   Zoster (Shingles) Vaccine (1 of 2) 06/02/2024*   DTaP/Tdap/Td vaccine (1 - Tdap) 03/02/2025*   Eye exam for diabetics  07/29/2024   Flu Shot  07/30/2024   Hemoglobin A1C  09/02/2024   Medicare Annual Wellness Visit  05/27/2025   Pneumonia Vaccine  Completed   HPV Vaccine  Aged Out   Meningitis B Vaccine  Aged Out  *Topic was postponed. The date shown is not the original due date.    Advanced directives: (Declined) Advance directive discussed with you today. Even though you declined this today, please call our office should you change your mind, and we can give you the proper paperwork for you to fill out. Advance Care Planning is important because it:  [x]  Makes sure you receive the medical care that is consistent with your values, goals, and preferences  [x]  It provides guidance to your family and loved ones and reduces their decisional burden about whether or not they are making the right decisions based on your wishes.  Follow the link provided in your after visit summary or read over the  paperwork we have mailed to you to help you started getting your Advance Directives in place. If you need assistance in completing these, please reach out to us  so that we can help you!  See attachments for Preventive Care and Fall Prevention Tips.
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
## 2024-05-28 ENCOUNTER — Other Ambulatory Visit: Payer: Self-pay

## 2024-05-28 ENCOUNTER — Ambulatory Visit (HOSPITAL_COMMUNITY)

## 2024-05-28 ENCOUNTER — Ambulatory Visit (HOSPITAL_COMMUNITY): Admitting: Anesthesiology

## 2024-05-28 ENCOUNTER — Encounter (HOSPITAL_COMMUNITY): Payer: Self-pay | Admitting: Orthopaedic Surgery

## 2024-05-28 ENCOUNTER — Observation Stay (HOSPITAL_COMMUNITY)

## 2024-05-28 ENCOUNTER — Encounter (HOSPITAL_COMMUNITY)
Admission: RE | Disposition: A | Discharge status: Skilled Nursing Facility | Payer: Self-pay | Source: Ambulatory Visit | Attending: Orthopaedic Surgery

## 2024-05-28 ENCOUNTER — Observation Stay (HOSPITAL_COMMUNITY)
Admission: RE | Admit: 2024-05-28 | Discharge: 2024-05-31 | Disposition: A | Discharge status: Skilled Nursing Facility | Source: Ambulatory Visit | Attending: Orthopaedic Surgery | Admitting: Orthopaedic Surgery

## 2024-05-28 DIAGNOSIS — Z87891 Personal history of nicotine dependence: Secondary | ICD-10-CM

## 2024-05-28 DIAGNOSIS — Z79899 Other long term (current) drug therapy: Secondary | ICD-10-CM | POA: Diagnosis not present

## 2024-05-28 DIAGNOSIS — Z96642 Presence of left artificial hip joint: Secondary | ICD-10-CM | POA: Diagnosis not present

## 2024-05-28 DIAGNOSIS — Z7984 Long term (current) use of oral hypoglycemic drugs: Secondary | ICD-10-CM | POA: Insufficient documentation

## 2024-05-28 DIAGNOSIS — Z96649 Presence of unspecified artificial hip joint: Secondary | ICD-10-CM

## 2024-05-28 DIAGNOSIS — I10 Essential (primary) hypertension: Secondary | ICD-10-CM

## 2024-05-28 DIAGNOSIS — M1612 Unilateral primary osteoarthritis, left hip: Principal | ICD-10-CM | POA: Diagnosis present

## 2024-05-28 DIAGNOSIS — Z7982 Long term (current) use of aspirin: Secondary | ICD-10-CM | POA: Insufficient documentation

## 2024-05-28 DIAGNOSIS — J45909 Unspecified asthma, uncomplicated: Secondary | ICD-10-CM | POA: Insufficient documentation

## 2024-05-28 DIAGNOSIS — Z471 Aftercare following joint replacement surgery: Secondary | ICD-10-CM | POA: Diagnosis not present

## 2024-05-28 DIAGNOSIS — Z794 Long term (current) use of insulin: Secondary | ICD-10-CM | POA: Diagnosis not present

## 2024-05-28 DIAGNOSIS — E1151 Type 2 diabetes mellitus with diabetic peripheral angiopathy without gangrene: Secondary | ICD-10-CM | POA: Diagnosis not present

## 2024-05-28 DIAGNOSIS — E119 Type 2 diabetes mellitus without complications: Secondary | ICD-10-CM | POA: Insufficient documentation

## 2024-05-28 HISTORY — PX: TOTAL HIP ARTHROPLASTY: SHX124

## 2024-05-28 LAB — GLUCOSE, CAPILLARY
Glucose-Capillary: 131 mg/dL — ABNORMAL HIGH (ref 70–99)
Glucose-Capillary: 99 mg/dL (ref 70–99)
Glucose-Capillary: 131 mg/dL — ABNORMAL HIGH (ref 70–99)
Glucose-Capillary: 142 mg/dL — ABNORMAL HIGH (ref 70–99)

## 2024-05-28 LAB — TYPE AND SCREEN
ABO/RH(D): B POS
Antibody Screen: NEGATIVE

## 2024-05-28 LAB — ABO/RH: ABO/RH(D): B POS

## 2024-05-28 LAB — HEMOGLOBIN A1C
Hgb A1c MFr Bld: 7 % — ABNORMAL HIGH (ref 4.8–5.6)
Mean Plasma Glucose: 154.2 mg/dL

## 2024-05-28 SURGERY — ARTHROPLASTY, HIP, TOTAL, ANTERIOR APPROACH
Anesthesia: General | Site: Hip | Laterality: Left

## 2024-05-28 MED ORDER — HYDROMORPHONE HCL 1 MG/ML IJ SOLN
0.5000 mg | INTRAMUSCULAR | Status: DC | PRN
  Administered 2024-05-28: 0.5 mg via INTRAVENOUS
  Filled 2024-05-28: qty 1

## 2024-05-28 MED ORDER — SODIUM CHLORIDE 0.9 % IR SOLN
Status: DC | PRN
  Administered 2024-05-28: 1000 mL

## 2024-05-28 MED ORDER — HYDROMORPHONE HCL 1 MG/ML IJ SOLN
INTRAMUSCULAR | Status: DC | PRN
  Administered 2024-05-28 (×2): .5 mg via INTRAVENOUS

## 2024-05-28 MED ORDER — METOCLOPRAMIDE HCL 5 MG/ML IJ SOLN: 5.0000 mg | Freq: Three times a day (TID) | INTRAMUSCULAR | Status: DC | PRN

## 2024-05-28 MED ORDER — CEFAZOLIN SODIUM-DEXTROSE 2-4 GM/100ML-% IV SOLN
2.0000 g | INTRAVENOUS | Status: AC
  Administered 2024-05-28: 2 g via INTRAVENOUS
  Filled 2024-05-28: qty 100

## 2024-05-28 MED ORDER — DIAZEPAM 5 MG PO TABS
5.0000 mg | ORAL_TABLET | Freq: Two times a day (BID) | ORAL | Status: DC | PRN
  Administered 2024-05-28 – 2024-05-31 (×3): 5 mg via ORAL
  Filled 2024-05-28 (×3): qty 1

## 2024-05-28 MED ORDER — BUPIVACAINE IN DEXTROSE 0.75-8.25 % IT SOLN
INTRATHECAL | Status: DC | PRN
  Administered 2024-05-28: 12 mg via INTRATHECAL

## 2024-05-28 MED ORDER — CHLORHEXIDINE GLUCONATE 0.12 % MT SOLN
15.0000 mL | Freq: Once | OROMUCOSAL | Status: AC
  Administered 2024-05-28: 15 mL via OROMUCOSAL

## 2024-05-28 MED ORDER — ORAL CARE MOUTH RINSE: 15.0000 mL | Freq: Once | OROMUCOSAL | Status: AC

## 2024-05-28 MED ORDER — INSULIN ASPART 100 UNIT/ML IJ SOLN: 0.0000 [IU] | Freq: Every day | INTRAMUSCULAR | Status: DC

## 2024-05-28 MED ORDER — TAMSULOSIN HCL 0.4 MG PO CAPS
0.8000 mg | ORAL_CAPSULE | Freq: Every day | ORAL | Status: DC
  Administered 2024-05-28 – 2024-05-29 (×2): 0.8 mg via ORAL
  Filled 2024-05-28 (×2): qty 2

## 2024-05-28 MED ORDER — PANTOPRAZOLE SODIUM 40 MG PO TBEC
40.0000 mg | DELAYED_RELEASE_TABLET | Freq: Every day | ORAL | Status: DC
  Administered 2024-05-28 – 2024-05-31 (×4): 40 mg via ORAL
  Filled 2024-05-28 (×4): qty 1

## 2024-05-28 MED ORDER — MIDAZOLAM HCL 2 MG/2ML IJ SOLN: 0.5000 mg | Freq: Once | INTRAMUSCULAR | Status: DC | PRN

## 2024-05-28 MED ORDER — ATORVASTATIN CALCIUM 20 MG PO TABS
20.0000 mg | ORAL_TABLET | Freq: Every day | ORAL | Status: DC
  Administered 2024-05-28 – 2024-05-31 (×4): 20 mg via ORAL
  Filled 2024-05-28 (×4): qty 1

## 2024-05-28 MED ORDER — LOSARTAN POTASSIUM 50 MG PO TABS
100.0000 mg | ORAL_TABLET | Freq: Every day | ORAL | Status: DC
  Administered 2024-05-28 – 2024-05-31 (×4): 100 mg via ORAL
  Filled 2024-05-28 (×4): qty 2

## 2024-05-28 MED ORDER — HYDROMORPHONE HCL 2 MG/ML IJ SOLN
INTRAMUSCULAR | Status: AC
  Filled 2024-05-28: qty 1

## 2024-05-28 MED ORDER — SODIUM CHLORIDE 0.9 % IV SOLN: INTRAVENOUS | Status: DC

## 2024-05-28 MED ORDER — FENTANYL CITRATE PF 50 MCG/ML IJ SOSY: 25.0000 ug | PREFILLED_SYRINGE | INTRAMUSCULAR | Status: DC | PRN

## 2024-05-28 MED ORDER — OXYCODONE HCL 5 MG PO TABS: 5.0000 mg | ORAL_TABLET | Freq: Once | ORAL | Status: DC | PRN

## 2024-05-28 MED ORDER — OXYBUTYNIN CHLORIDE ER 5 MG PO TB24
5.0000 mg | ORAL_TABLET | Freq: Every day | ORAL | Status: DC
  Administered 2024-05-28 – 2024-05-29 (×2): 5 mg via ORAL
  Filled 2024-05-28 (×2): qty 1

## 2024-05-28 MED ORDER — POVIDONE-IODINE 10 % EX SWAB: 2.0000 | Freq: Once | CUTANEOUS | Status: DC

## 2024-05-28 MED ORDER — ALUM & MAG HYDROXIDE-SIMETH 200-200-20 MG/5ML PO SUSP
30.0000 mL | ORAL | Status: DC | PRN
  Administered 2024-05-31: 30 mL via ORAL
  Filled 2024-05-28: qty 30

## 2024-05-28 MED ORDER — PHENYLEPHRINE HCL-NACL 20-0.9 MG/250ML-% IV SOLN
INTRAVENOUS | Status: DC | PRN
  Administered 2024-05-28: 30 ug/min via INTRAVENOUS

## 2024-05-28 MED ORDER — MEPERIDINE HCL 50 MG/ML IJ SOLN: 6.2500 mg | INTRAMUSCULAR | Status: DC | PRN

## 2024-05-28 MED ORDER — AMLODIPINE BESYLATE 10 MG PO TABS
10.0000 mg | ORAL_TABLET | Freq: Every day | ORAL | Status: DC
  Administered 2024-05-29 – 2024-05-31 (×3): 10 mg via ORAL
  Filled 2024-05-28 (×3): qty 1

## 2024-05-28 MED ORDER — TORSEMIDE 20 MG PO TABS
20.0000 mg | ORAL_TABLET | Freq: Every day | ORAL | Status: DC
  Administered 2024-05-29 – 2024-05-31 (×3): 20 mg via ORAL
  Filled 2024-05-28 (×3): qty 1

## 2024-05-28 MED ORDER — EPHEDRINE SULFATE-NACL 50-0.9 MG/10ML-% IV SOSY
PREFILLED_SYRINGE | INTRAVENOUS | Status: DC | PRN
Start: 2024-05-28 — End: 2024-05-28
  Administered 2024-05-28: 5 mg via INTRAVENOUS

## 2024-05-28 MED ORDER — DIPHENHYDRAMINE HCL 12.5 MG/5ML PO ELIX: 12.5000 mg | ORAL_SOLUTION | ORAL | Status: DC | PRN

## 2024-05-28 MED ORDER — 0.9 % SODIUM CHLORIDE (POUR BTL) OPTIME
TOPICAL | Status: DC | PRN
  Administered 2024-05-28: 1000 mL

## 2024-05-28 MED ORDER — INSULIN ASPART 100 UNIT/ML IJ SOLN
0.0000 [IU] | Freq: Three times a day (TID) | INTRAMUSCULAR | Status: DC
  Administered 2024-05-29: 2 [IU] via SUBCUTANEOUS
  Administered 2024-05-29 – 2024-05-30 (×2): 3 [IU] via SUBCUTANEOUS
  Administered 2024-05-30 – 2024-05-31 (×2): 2 [IU] via SUBCUTANEOUS

## 2024-05-28 MED ORDER — STERILE WATER FOR IRRIGATION IR SOLN
Status: DC | PRN
  Administered 2024-05-28: 2000 mL

## 2024-05-28 MED ORDER — METFORMIN HCL ER 500 MG PO TB24
500.0000 mg | ORAL_TABLET | Freq: Two times a day (BID) | ORAL | Status: DC
  Administered 2024-05-28 – 2024-05-31 (×6): 500 mg via ORAL
  Filled 2024-05-28 (×6): qty 1

## 2024-05-28 MED ORDER — PROPOFOL 500 MG/50ML IV EMUL
INTRAVENOUS | Status: DC | PRN
  Administered 2024-05-28: 120 ug/kg/min via INTRAVENOUS
  Administered 2024-05-28: 100 mg via INTRAVENOUS

## 2024-05-28 MED ORDER — LATANOPROST 0.005 % OP SOLN
1.0000 [drp] | Freq: Every day | OPHTHALMIC | Status: DC
  Administered 2024-05-28 – 2024-05-29 (×2): 1 [drp] via OPHTHALMIC
  Filled 2024-05-28: qty 2.5

## 2024-05-28 MED ORDER — OXYCODONE HCL 5 MG PO TABS
10.0000 mg | ORAL_TABLET | ORAL | Status: DC | PRN
  Administered 2024-05-29: 10 mg via ORAL
  Filled 2024-05-28: qty 2

## 2024-05-28 MED ORDER — ACETAMINOPHEN 325 MG PO TABS
325.0000 mg | ORAL_TABLET | Freq: Four times a day (QID) | ORAL | Status: DC | PRN
  Administered 2024-05-30: 650 mg via ORAL
  Filled 2024-05-28 (×2): qty 2

## 2024-05-28 MED ORDER — ONDANSETRON HCL 4 MG PO TABS: 4.0000 mg | ORAL_TABLET | Freq: Four times a day (QID) | ORAL | Status: DC | PRN

## 2024-05-28 MED ORDER — ASPIRIN 81 MG PO CHEW
81.0000 mg | CHEWABLE_TABLET | Freq: Two times a day (BID) | ORAL | Status: DC
  Administered 2024-05-28 – 2024-05-31 (×5): 81 mg via ORAL
  Filled 2024-05-28 (×5): qty 1

## 2024-05-28 MED ORDER — FINASTERIDE 5 MG PO TABS
5.0000 mg | ORAL_TABLET | Freq: Every day | ORAL | Status: DC
  Administered 2024-05-28 – 2024-05-29 (×2): 5 mg via ORAL
  Filled 2024-05-28 (×2): qty 1

## 2024-05-28 MED ORDER — PHENOL 1.4 % MT LIQD: 1.0000 | OROMUCOSAL | Status: DC | PRN

## 2024-05-28 MED ORDER — ONDANSETRON HCL 4 MG/2ML IJ SOLN
4.0000 mg | Freq: Four times a day (QID) | INTRAMUSCULAR | Status: DC | PRN
Start: 2024-05-28 — End: 2024-05-31

## 2024-05-28 MED ORDER — OXYCODONE HCL 5 MG/5ML PO SOLN: 5.0000 mg | Freq: Once | ORAL | Status: DC | PRN

## 2024-05-28 MED ORDER — CEFAZOLIN SODIUM-DEXTROSE 2-4 GM/100ML-% IV SOLN
2.0000 g | Freq: Four times a day (QID) | INTRAVENOUS | Status: AC
  Administered 2024-05-28 – 2024-05-29 (×2): 2 g via INTRAVENOUS
  Filled 2024-05-28 (×2): qty 100

## 2024-05-28 MED ORDER — OXYCODONE HCL 5 MG PO TABS
5.0000 mg | ORAL_TABLET | ORAL | Status: DC | PRN
  Administered 2024-05-28 – 2024-05-31 (×3): 5 mg via ORAL
  Filled 2024-05-28: qty 1
  Filled 2024-05-28: qty 2
  Filled 2024-05-28 (×2): qty 1

## 2024-05-28 MED ORDER — PHENYLEPHRINE HCL-NACL 20-0.9 MG/250ML-% IV SOLN
INTRAVENOUS | Status: AC
  Filled 2024-05-28: qty 250

## 2024-05-28 MED ORDER — GABAPENTIN 300 MG PO CAPS
300.0000 mg | ORAL_CAPSULE | Freq: Two times a day (BID) | ORAL | Status: DC
  Administered 2024-05-28 – 2024-05-31 (×5): 300 mg via ORAL
  Filled 2024-05-28 (×5): qty 1

## 2024-05-28 MED ORDER — INSULIN ASPART 100 UNIT/ML IJ SOLN: 0.0000 [IU] | INTRAMUSCULAR | Status: DC | PRN

## 2024-05-28 MED ORDER — METHOCARBAMOL 500 MG PO TABS
500.0000 mg | ORAL_TABLET | Freq: Four times a day (QID) | ORAL | Status: DC | PRN
  Administered 2024-05-29 (×2): 500 mg via ORAL
  Filled 2024-05-28 (×2): qty 1

## 2024-05-28 MED ORDER — ACETAMINOPHEN 500 MG PO TABS: 1000.0000 mg | ORAL_TABLET | Freq: Once | ORAL | Status: DC

## 2024-05-28 MED ORDER — LACTATED RINGERS IV SOLN: INTRAVENOUS | Status: DC

## 2024-05-28 MED ORDER — METHOCARBAMOL 1000 MG/10ML IJ SOLN: 500.0000 mg | Freq: Four times a day (QID) | INTRAMUSCULAR | Status: DC | PRN

## 2024-05-28 MED ORDER — METOCLOPRAMIDE HCL 5 MG PO TABS: 5.0000 mg | ORAL_TABLET | Freq: Three times a day (TID) | ORAL | Status: DC | PRN

## 2024-05-28 MED ORDER — FENTANYL CITRATE (PF) 100 MCG/2ML IJ SOLN
INTRAMUSCULAR | Status: AC
  Filled 2024-05-28: qty 2

## 2024-05-28 MED ORDER — TRANEXAMIC ACID-NACL 1000-0.7 MG/100ML-% IV SOLN
1000.0000 mg | INTRAVENOUS | Status: AC
  Administered 2024-05-28: 1000 mg via INTRAVENOUS
  Filled 2024-05-28: qty 100

## 2024-05-28 MED ORDER — FENTANYL CITRATE (PF) 100 MCG/2ML IJ SOLN
INTRAMUSCULAR | Status: DC | PRN
  Administered 2024-05-28 (×2): 50 ug via INTRAVENOUS

## 2024-05-28 MED ORDER — EPHEDRINE 5 MG/ML INJ
INTRAVENOUS | Status: AC
  Filled 2024-05-28: qty 5

## 2024-05-28 MED ORDER — ONDANSETRON HCL 4 MG/2ML IJ SOLN
INTRAMUSCULAR | Status: DC | PRN
Start: 2024-05-28 — End: 2024-05-28
  Administered 2024-05-28: 4 mg via INTRAVENOUS

## 2024-05-28 MED ORDER — MENTHOL 3 MG MT LOZG: 1.0000 | LOZENGE | OROMUCOSAL | Status: DC | PRN

## 2024-05-28 MED ORDER — DOCUSATE SODIUM 100 MG PO CAPS
100.0000 mg | ORAL_CAPSULE | Freq: Two times a day (BID) | ORAL | Status: DC
  Administered 2024-05-28 – 2024-05-31 (×5): 100 mg via ORAL
  Filled 2024-05-28 (×5): qty 1

## 2024-05-28 SURGICAL SUPPLY — 36 items
BAG COUNTER SPONGE SURGICOUNT (BAG) ×2 IMPLANT
BAG ZIPLOCK 12X15 (MISCELLANEOUS) IMPLANT
BENZOIN TINCTURE PRP APPL 2/3 (GAUZE/BANDAGES/DRESSINGS) IMPLANT
BLADE SAW SGTL 18X1.27X75 (BLADE) ×2 IMPLANT
COVER PERINEAL POST (MISCELLANEOUS) ×2 IMPLANT
COVER SURGICAL LIGHT HANDLE (MISCELLANEOUS) ×2 IMPLANT
CUP ACET PINNACLE SECTR 60MM (Hips) IMPLANT
DRAPE FOOT SWITCH (DRAPES) ×2 IMPLANT
DRAPE STERI IOBAN 125X83 (DRAPES) ×2 IMPLANT
DRAPE U-SHAPE 47X51 STRL (DRAPES) ×4 IMPLANT
DRSG AQUACEL AG ADV 3.5X10 (GAUZE/BANDAGES/DRESSINGS) ×2 IMPLANT
DURAPREP 26ML APPLICATOR (WOUND CARE) ×2 IMPLANT
ELECT PENCIL ROCKER SW 15FT (MISCELLANEOUS) ×2 IMPLANT
ELECT REM PT RETURN 15FT ADLT (MISCELLANEOUS) ×2 IMPLANT
GAUZE XEROFORM 1X8 LF (GAUZE/BANDAGES/DRESSINGS) IMPLANT
GLOVE BIO SURGEON STRL SZ7.5 (GLOVE) ×2 IMPLANT
GLOVE BIOGEL PI IND STRL 8 (GLOVE) ×4 IMPLANT
GLOVE ECLIPSE 8.0 STRL XLNG CF (GLOVE) ×2 IMPLANT
GOWN STRL REUS W/ TWL XL LVL3 (GOWN DISPOSABLE) ×4 IMPLANT
HEAD FEM OFFSET 40 -2 (Head) IMPLANT
HOLDER FOLEY CATH W/STRAP (MISCELLANEOUS) ×2 IMPLANT
INSERT NEUT HIP ALTRX 40 60 +4 (Insert) IMPLANT
KIT TURNOVER KIT A (KITS) IMPLANT
PACK ANTERIOR HIP CUSTOM (KITS) ×2 IMPLANT
SET HNDPC FAN SPRY TIP SCT (DISPOSABLE) ×2 IMPLANT
STAPLER SKIN PROX 35W (STAPLE) IMPLANT
STEM FEMORAL SZ9 HIGH ACTIS (Stem) IMPLANT
STRIP CLOSURE SKIN 1/2X4 (GAUZE/BANDAGES/DRESSINGS) IMPLANT
SUT ETHIBOND NAB CT1 #1 30IN (SUTURE) ×2 IMPLANT
SUT ETHILON 2 0 PS N (SUTURE) IMPLANT
SUT MNCRL AB 4-0 PS2 18 (SUTURE) IMPLANT
SUT VIC AB 0 CT1 36 (SUTURE) ×2 IMPLANT
SUT VIC AB 1 CT1 36 (SUTURE) ×2 IMPLANT
SUT VIC AB 2-0 CT1 TAPERPNT 27 (SUTURE) ×4 IMPLANT
TRAY FOLEY MTR SLVR 16FR STAT (SET/KITS/TRAYS/PACK) IMPLANT
YANKAUER SUCT BULB TIP NO VENT (SUCTIONS) ×2 IMPLANT

## 2024-05-28 NOTE — Anesthesia Postprocedure Evaluation (Signed)
 Anesthesia Post Note  Patient: [name]  Procedure(s) Performed: ARTHROPLASTY, HIP, TOTAL, ANTERIOR APPROACH (Left: Hip)     Patient location during evaluation: PACU Anesthesia Type: Spinal Level of consciousness: awake and alert, patient cooperative and oriented Pain management: pain level controlled Vital Signs Assessment: post-procedure vital signs reviewed and stable Respiratory status: spontaneous breathing, nonlabored ventilation and respiratory function stable Cardiovascular status: blood pressure returned to baseline and stable Postop Assessment: no apparent nausea or vomiting, adequate PO intake, spinal receding and patient able to bend at knees Anesthetic complications: no   No notable events documented.  Last Vitals:  Vitals:   05/28/24 1615 05/28/24 1630  BP: (!) 129/53 129/67  Pulse: 76 72  Resp: 14 13  Temp:    SpO2: 96% 96%    Last Pain:  Vitals:   05/28/24 1615  TempSrc:   PainSc: 0-No pain                 [name],E. Gianny Sabino

## 2024-05-28 NOTE — Plan of Care (Signed)

## 2024-05-28 NOTE — Anesthesia Procedure Notes (Signed)
 Procedure Name: LMA Insertion Date/Time: 05/28/2024 2:11 PM  Performed by: Raylene Calamity, CRNAPre-anesthesia Checklist: Patient identified, Emergency Drugs available, Suction available and Patient being monitored Patient Re-evaluated:Patient Re-evaluated prior to induction Oxygen Delivery Method: Circle System Utilized Preoxygenation: Pre-oxygenation with 100% oxygen Induction Type: IV induction Ventilation: Mask ventilation without difficulty LMA: LMA inserted LMA Size: 5.0 Number of attempts: 1 Airway Equipment and Method: Bite block Placement Confirmation: positive ETCO2 Tube secured with: Tape Dental Injury: Teeth and Oropharynx as per pre-operative assessment

## 2024-05-28 NOTE — Transfer of Care (Signed)
 Immediate Anesthesia Transfer of Care Note  Patient: [name]  Procedure(s) Performed: ARTHROPLASTY, HIP, TOTAL, ANTERIOR APPROACH (Left: Hip)  Patient Location: PACU  Anesthesia Type:General  Level of Consciousness: drowsy  Airway & Oxygen Therapy: Patient Spontanous Breathing and Patient connected to nasal cannula oxygen  Post-op Assessment: Report given to RN and Post -op Vital signs reviewed and stable  Post vital signs: Reviewed and stable  Last Vitals:  Vitals Value Taken Time  BP 117/62 05/28/24 1541  Temp    Pulse 61 05/28/24 1544  Resp 9 05/28/24 1544  SpO2 97 % 05/28/24 1544  Vitals shown include unfiled device data.  Last Pain:  Vitals:   05/28/24 1024  TempSrc: Oral  PainSc:          Complications: No notable events documented.

## 2024-05-28 NOTE — Anesthesia Procedure Notes (Deleted)
 Spinal  Staffing Performed by: Raylene Calamity, CRNA Authorized by: Jonne Netters, MD

## 2024-05-28 NOTE — Anesthesia Preprocedure Evaluation (Addendum)
 Anesthesia Evaluation  Patient identified by MRN, date of birth, ID band Patient awake    Reviewed: Allergy & Precautions, NPO status , Patient's Chart, lab work & pertinent test results  History of Anesthesia Complications Negative for: history of anesthetic complications  Airway Mallampati: I  TM Distance: >3 FB Neck ROM: Full    Dental  (+) Edentulous Upper, Dental Advisory Given   Pulmonary former smoker   breath sounds clear to auscultation       Cardiovascular hypertension, Pt. on medications + Peripheral Vascular Disease   Rhythm:Regular Rate:Normal     Neuro/Psych Seizures -,  PSYCHIATRIC DISORDERS (h/o benzodiazepine dependence) Anxiety     glaucoma    GI/Hepatic Neg liver ROS,GERD  Controlled,,  Endo/Other  diabetes (glu 131), Insulin  Dependent, Oral Hypoglycemic Agents    Renal/GU negative Renal ROS     Musculoskeletal  (+) Arthritis ,    Abdominal   Peds  Hematology Hb 13.9, plt 344k   Anesthesia Other Findings   Reproductive/Obstetrics                             Anesthesia Physical Anesthesia Plan  ASA: 3  Anesthesia Plan: Spinal   Post-op Pain Management: Tylenol  PO (pre-op)*   Induction:   PONV Risk Score and Plan: 1 and Treatment may vary due to age or medical condition  Airway Management Planned: Natural Airway and Simple Face Mask  Additional Equipment: None  Intra-op Plan:   Post-operative Plan:   Informed Consent: I have reviewed the patients History and Physical, chart, labs and discussed the procedure including the risks, benefits and alternatives for the proposed anesthesia with the patient or authorized representative who has indicated his/her understanding and acceptance.     Dental advisory given  Plan Discussed with: CRNA and Surgeon  Anesthesia Plan Comments:         Anesthesia Quick Evaluation

## 2024-05-28 NOTE — Interval H&P Note (Signed)
 History and Physical Interval Note: The patient understands that he is here today for a left total hip replacement to treat his severe left hip arthritis and pain.  There has been no acute or interval change in his medical status.  The risks and benefits of surgery have been discussed in detail and informed consent has been obtained.  The left operative hip has been marked.  05/28/2024 11:31 AM  [name]  has presented today for surgery, with the diagnosis of osteoarthritis left hip.  The various methods of treatment have been discussed with the patient and family. After consideration of risks, benefits and other options for treatment, the patient has consented to  Procedure(s): ARTHROPLASTY, HIP, TOTAL, ANTERIOR APPROACH (Left) as a surgical intervention.  The patient's history has been reviewed, patient examined, no change in status, stable for surgery.  I have reviewed the patient's chart and labs.  Questions were answered to the patient's satisfaction.     Arnie Lao

## 2024-05-28 NOTE — TOC Initial Note (Signed)
 Transition of Care [facility]) - Initial/Assessment Note    Patient Details  Name: [name] MRN: [id number] Date of Birth: 07/10/1933  Transition of Care [facility]) CM/SW Contact:    Bari Leys, RN Phone Number: 05/28/2024, 2:04 PM  Clinical Narrative:  Teams chat received from Ortho surgeon office nurse, Arvilla Laud " This patient of Dr. Arvella Bird got set up for HHPT, but he is going to need SNF placement most likely. He lives alone and has no one to help him. He's still in PACU right now, just wanted to be proactive"                        Patient Goals and CMS Choice            Expected Discharge Plan and Services                                              Prior Living Arrangements/Services                       Activities of Daily Living      Permission Sought/Granted                  Emotional Assessment              Admission diagnosis:  Primary osteoarthritis of left hip [M16.12] Patient Active Problem List   Diagnosis Date Noted   Unilateral primary osteoarthritis, left hip 02/25/2024   Primary hypertension 02/07/2022   Osteoarthritis of multiple joints 05/08/2021   PAD (peripheral artery disease) (HCC) 02/13/2021   Benzodiazepine dependence (HCC) 10/16/2018   Controlled substance agreement signed 10/16/2018   BPH (benign prostatic hyperplasia) 07/17/2018   Edema 05/01/2017   Chronic back pain 07/11/2016   Vitamin D  insufficiency 10/13/2013   DOE (dyspnea on exertion) 04/22/2013   Vocal fold paresis, 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   Candida esophagitis (HCC) 05/14/2012   Helicobacter pylori gastritis 05/14/2012   GERD (gastroesophageal reflux disease) 04/15/2012   Glaucoma primary, open angle 03/23/2012   Anxiety 03/23/2012   Syncope and collapse 02/26/2012   Type 2 diabetes mellitus treated with insulin  (HCC) 02/26/2012   PCP:  Yevette Hem, FNP Pharmacy:   [id number] - [place] [place] - [address] [place] [address] Phone: [phone] Fax: [phone]  [facility]) - [place], [place] - [address] [facility] [address] [place] [address] [place] [address] Phone: [phone] Fax: [phone]     Social Drivers of Health (SDOH) Social History: SDOH Screenings   Food Insecurity: No Food Insecurity (05/27/2024)  Housing: Unknown (05/27/2024)  Transportation Needs: No Transportation Needs (05/27/2024)  Utilities: Not At Risk (05/27/2024)  Alcohol  Screen: Low Risk  (05/27/2024)  Depression (PHQ2-9): Low Risk  (05/27/2024)  Financial Resource Strain: High Risk (05/27/2024)  Physical Activity: Insufficiently Active (05/27/2023)  Social Connections: Unknown (05/27/2024)  Stress: No Stress Concern Present (05/27/2024)  Tobacco Use: Medium Risk (05/28/2024)  Health Literacy: Adequate Health Literacy (05/27/2024)   SDOH Interventions:     Readmission Risk Interventions     No data to display

## 2024-05-28 NOTE — Anesthesia Procedure Notes (Addendum)
 Spinal  Patient location during procedure: OR Start time: 05/28/2024 1:42 PM End time: 05/28/2024 1:49 PM Reason for block: surgical anesthesia Staffing Performed: other anesthesia staff  Anesthesiologist: Jonne Netters, MD Other anesthesia staff: Larena Plaza, RN Performed by: Raylene Calamity, CRNA Authorized by: Jonne Netters, MD   Preanesthetic Checklist Completed: patient identified, IV checked, site marked, risks and benefits discussed, surgical consent, monitors and equipment checked, pre-op evaluation and timeout performed Spinal Block Patient position: sitting Prep: DuraPrep Patient monitoring: heart rate, cardiac monitor, continuous pulse ox and blood pressure Approach: midline Location: L3-4 Injection technique: single-shot Needle Needle type: Sprotte  Needle gauge: 24 G Needle length: 9 cm Assessment Sensory level: T4 Events: CSF return

## 2024-05-28 NOTE — Op Note (Signed)
 Operative Note  Date of operation: 05/28/2024 Preoperative diagnosis: Left hip primary osteoarthritis Postoperative diagnosis: Same  Procedure: Left direct anterior total hip arthroplasty  Implants: Implant Name Type Inv. Item Serial No. Manufacturer Lot No. LRB No. Used Action  CUP ACET PINNACLE SECTR - [id number] Hips CUP ACET PINNACLE SECTR  [employer] [id number] Left 1 Implanted  INSERT NEUT HIP ALTRX 40 60 +4 - [id number] Insert INSERT NEUT HIP ALTRX 40 60 +4  DEPUY [employer]TH[employer] [id number] Left 1 Implanted  STEM FEMORAL SZ9 HIGH ACTIS - [id number] Stem STEM FEMORAL SZ9 HIGH ACTIS  DEPUY ORTH[employer] [id number] Left 1 Implanted  HEAD FEM OFFSET 40 -2 - [id number] Head HEAD FEM OFFSET 40 -2  [employer]PU[employer] [id number] Left 1 Implanted   Surgeon: Jeanella Milan. Lucienne Ryder, MD Assistant: Malena Scull, PA-C  Anesthesia: #1 attempted spinal, #2 General EBL: 200 cc Antibiotics: IV Ancef Complications: None  Indications: The patient is a 89 year old gentleman with well-documented debilitating arthritis involving his left hip.  His x-rays show bone-on-bone wear.  Of the left hip.  His pain is severe and daily and it is detrimentally affecting his mobility, his quality of life and his actives daily living to the point he wished to proceed with a left hip replacement.  We did discuss the risks of acute blood loss anemia, nerve or vessel injury, fracture, infection, DVT, dislocation, implant failure, leg length differences and wound healing issues.  He understands that our goals are hopefully decreased pain, improved mobility and improved quality of life.  Procedure description: After informed consent was obtained and the appropriate left hip was marked, the patient was brought to the operating room and set up on the stretcher where spinal anesthesia was attempted.  He was laid in the supine position on the stretcher.  He still exhibited enough pain with his left hip the general anesthesia was  obtained via LMA.  Traction boots were placed on both his feet and next he was placed supine on the Hana fracture table with a perineal post in place and both legs in inline skeletal traction vices no traction applied.  His left operative hip and pelvis were assessed radiographically.  The left hip was prepped and draped with DuraPrep and sterile drapes.  A timeout was called and he was identified as the correct patient the correct left hip.  An incision was then made just inferior and posterior to the ASIS and carried slightly obliquely down the leg.  Dissection was carried down to the tensor fascia lata muscle and the tensor fascia was divided longitudinally to proceed with a direction to broach the hip.  Circumflex vessels were identified and cauterized.  The hip capsule identified and opened up.  Cobra retractors were then placed around the medial and lateral femoral neck and a femoral neck cut was made with an oscillating saw just proximal to the lesser trochanter.  This cut was completed with an osteotome.  A corkscrew guide was placed in the femoral head and the femoral head was removed in its entirety.  It was quite large and completely devoid of cartilage.  A bent Hohmann was then placed over the medial acetabular rim and remnants of the acetabular labrum and other debris removed.  Reaming was initiated from a size 43 reamer and stepwise instruments going up to a size 59 reamer with all reamers placed under direct visualization and the last reamer placed under direct fluoroscopy as well in order to obtain the depth of reaming, the inclination and anteversion.  The real DePuy sector GRIPTION acetabular clinic size 60 was then placed without difficulty followed by +4 polythene liner.  Attention was then turned to the femur.  With the left leg externally rotated to 120 degrees, extended and adducted, a Mueller retractor was placed medially and Hohmann retractor behind the greater trochanter.  The lateral joint  capsule was released and a box cutting osteotome was used into the femoral canal.  Broaching was then initiated using the Actis broaching system from a size 0 going all the way up to a size 9.  With a size 9 in place we trialed a standard offset femoral neck and a 40-2 trial hip ball.  The left leg was brought over and up and with traction and internal rotation reduced in the pelvis.  We are pleased with stability and radiographic assessment of the left hip.  We did feel like there needed to be more offset.  We dislocated the hip remove the trial components.  We then placed the real Actis femoral component size 9 with high offset and the real 40-2 metal head ball.  Again this was reduced in the acetabulum which was very difficult to reduce but it was reduced and then it was stable on radiographic and clinical assessment.  We are pleased with the placement of the implants and how they looked under x-ray guidance.  The soft tissue was then irrigated with normal saline solution using pulsatile lavage.  Remnants of the joint capsule were closed with interrupted #1 Ethibond suture followed by #1 Vicryl to close the tensor fascia.  0 Vicryl was used to close deep tissue and 2-0 Vicryl was used to close subcutaneous tissue.  The skin was closed with staples.  An Aquacel dressing was applied.  The patient was taken off the Hana table, awakened, extubated and taken the recovery room.  Malena Scull, PA-C did assist during the entire case and beginning end and his assistance was crucial and medically necessary for soft tissue management and retraction, helping guide implant placement and a layered closure of the wound.

## 2024-05-29 DIAGNOSIS — M1612 Unilateral primary osteoarthritis, left hip: Secondary | ICD-10-CM | POA: Diagnosis not present

## 2024-05-29 DIAGNOSIS — Z79899 Other long term (current) drug therapy: Secondary | ICD-10-CM | POA: Diagnosis not present

## 2024-05-29 DIAGNOSIS — E119 Type 2 diabetes mellitus without complications: Secondary | ICD-10-CM | POA: Diagnosis not present

## 2024-05-29 DIAGNOSIS — J45909 Unspecified asthma, uncomplicated: Secondary | ICD-10-CM | POA: Diagnosis not present

## 2024-05-29 DIAGNOSIS — Z87891 Personal history of nicotine dependence: Secondary | ICD-10-CM | POA: Diagnosis not present

## 2024-05-29 DIAGNOSIS — I10 Essential (primary) hypertension: Secondary | ICD-10-CM | POA: Diagnosis not present

## 2024-05-29 DIAGNOSIS — Z7984 Long term (current) use of oral hypoglycemic drugs: Secondary | ICD-10-CM | POA: Diagnosis not present

## 2024-05-29 DIAGNOSIS — Z794 Long term (current) use of insulin: Secondary | ICD-10-CM | POA: Diagnosis not present

## 2024-05-29 DIAGNOSIS — Z7982 Long term (current) use of aspirin: Secondary | ICD-10-CM | POA: Diagnosis not present

## 2024-05-29 LAB — CBC
HCT: 32.9 % — ABNORMAL LOW (ref 39.0–52.0)
Hemoglobin: 11 g/dL — ABNORMAL LOW (ref 13.0–17.0)
MCH: 30.6 pg (ref 26.0–34.0)
MCHC: 33.4 g/dL (ref 30.0–36.0)
MCV: 91.6 fL (ref 80.0–100.0)
Platelets: 228 10*3/uL (ref 150–400)
RBC: 3.59 MIL/uL — ABNORMAL LOW (ref 4.22–5.81)
RDW: 13.4 % (ref 11.5–15.5)
WBC: 10 10*3/uL (ref 4.0–10.5)
nRBC: 0 % (ref 0.0–0.2)

## 2024-05-29 LAB — GLUCOSE, CAPILLARY
Glucose-Capillary: 135 mg/dL — ABNORMAL HIGH (ref 70–99)
Glucose-Capillary: 151 mg/dL — ABNORMAL HIGH (ref 70–99)
Glucose-Capillary: 99 mg/dL (ref 70–99)
Glucose-Capillary: 119 mg/dL — ABNORMAL HIGH (ref 70–99)

## 2024-05-29 LAB — BASIC METABOLIC PANEL WITH GFR
Anion gap: 11 (ref 5–15)
BUN: 13 mg/dL (ref 8–23)
CO2: 22 mmol/L (ref 22–32)
Calcium: 8.8 mg/dL — ABNORMAL LOW (ref 8.9–10.3)
Chloride: 101 mmol/L (ref 98–111)
Creatinine, Ser: 0.9 mg/dL (ref 0.61–1.24)
GFR, Estimated: >60 mL/min (ref >60)
Glucose, Bld: 164 mg/dL — ABNORMAL HIGH (ref 70–99)
Potassium: 4.5 mmol/L (ref 3.5–5.1)
Sodium: 134 mmol/L — ABNORMAL LOW (ref 135–145)
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
## 2024-05-29 NOTE — NC FL2 (Signed)
 [place]  MEDICAID FL2 LEVEL OF CARE FORM     IDENTIFICATION  Patient Name: [name] Birthdate: 05/30/1933 Sex: male Admission Date (Current Location): 05/28/2024  [facility] and [place] Number:  Producer, television/film/video and Address:  [facility],  [address], [place] [address]      Provider Number: [id number]  Attending Physician Name and Address:  Arnie Lao,*  Relative Name and Phone Number:  [facility])  [phone]    Current Level of Care: Hospital Recommended Level of Care: Skilled Nursing Facility Prior Approval Number:    Date Approved/Denied:   PASRR Number: [id number]  Discharge Plan: SNF    Current Diagnoses: Patient Active Problem List   Diagnosis Date Noted   Status post total replacement of left hip 05/28/2024   Unilateral primary osteoarthritis, left hip 02/25/2024   Primary hypertension 02/07/2022   Osteoarthritis of multiple joints 05/08/2021   PAD (peripheral artery disease) (HCC) 02/13/2021   Benzodiazepine dependence (HCC) 10/16/2018   Controlled substance agreement signed 10/16/2018   BPH (benign prostatic hyperplasia) 07/17/2018   Edema 05/01/2017   Chronic back pain 07/11/2016   Vitamin D  insufficiency 10/13/2013   DOE (dyspnea on exertion) 04/22/2013   Vocal fold paresis, 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   Candida esophagitis (HCC) 05/14/2012   Helicobacter pylori gastritis 05/14/2012   GERD (gastroesophageal reflux disease) 04/15/2012   Glaucoma primary, open angle 03/23/2012   Anxiety 03/23/2012   Syncope and collapse 02/26/2012   Type 2 diabetes mellitus treated with insulin  (HCC) 02/26/2012    Orientation RESPIRATION BLADDER Height & Weight     Self, Time, Situation, Place  O2 (1L O2) Continent Weight: 214 lb (97.1 kg) Height:  5\' 9"  (175.3 cm)  BEHAVIORAL SYMPTOMS/MOOD NEUROLOGICAL BOWEL NUTRITION STATUS      Continent Diet  AMBULATORY STATUS COMMUNICATION OF NEEDS Skin   Limited Assist Verbally  Normal                       Personal Care Assistance Level of Assistance  Bathing, Feeding, Dressing Bathing Assistance: Limited assistance Feeding assistance: Independent Dressing Assistance: Limited assistance     Functional Limitations Info  Speech, Hearing, Sight Sight Info: Impaired Hearing Info: Adequate Speech Info: Adequate    SPECIAL CARE FACTORS FREQUENCY  PT (By licensed PT), OT (By licensed OT)     PT Frequency: 5x/wk OT Frequency: 5x/wk            Contractures Contractures Info: Present    Additional Factors Info  Code Status, Allergies Code Status Info: Full code Allergies Info: Diclofenac   Flexeril (Cyclobenzaprine)  Guaifenesin Er           Current Medications (05/29/2024):  This is the current hospital active medication list Current Facility-Administered Medications  Medication Dose Route Frequency Provider Last Rate Last Admin   0.9 %  sodium chloride  infusion   Intravenous Continuous Arnie Lao, MD 75 mL/hr at 05/28/24 1744 New Bag at 05/28/24 1744   acetaminophen  (TYLENOL ) tablet 325-650 mg  325-650 mg Oral Q6H PRN Arnie Lao, MD       alum & mag hydroxide-simeth (MAALOX/MYLANTA) 200-200-20 MG/5ML suspension 30 mL  30 mL Oral Q4H PRN Arnie Lao, MD       amLODipine  (NORVASC ) tablet 10 mg  10 mg Oral Daily Arnie Lao, MD       aspirin  chewable tablet 81 mg  81 mg Oral BID Arnie Lao, MD   81 mg at 05/28/24 2115  atorvastatin  (LIPITOR) tablet 20 mg  20 mg Oral Daily Arnie Lao, MD   20 mg at 05/28/24 1743   diazepam  (VALIUM ) tablet 5 mg  5 mg Oral BID PRN Arnie Lao, MD   5 mg at 05/28/24 1851   diphenhydrAMINE  (BENADRYL ) 12.5 MG/5ML elixir 12.5-25 mg  12.5-25 mg Oral Q4H PRN Arnie Lao, MD       docusate sodium  (COLACE) capsule 100 mg  100 mg Oral BID Arnie Lao, MD   100 mg at 05/28/24 2115   finasteride  (PROSCAR ) tablet 5 mg   5 mg Oral QHS Arnie Lao, MD   5 mg at 05/28/24 2115   gabapentin  (NEURONTIN ) capsule 300 mg  300 mg Oral BID Arnie Lao, MD   300 mg at 05/28/24 2115   HYDROmorphone  (DILAUDID ) injection 0.5-1 mg  0.5-1 mg Intravenous Q4H PRN Arnie Lao, MD   0.5 mg at 05/28/24 2006   insulin  aspart (novoLOG ) injection 0-15 Units  0-15 Units Subcutaneous TID WC Arnie Lao, MD   2 Units at 05/29/24 1610   insulin  aspart (novoLOG ) injection 0-5 Units  0-5 Units Subcutaneous QHS Arnie Lao, MD       latanoprost  (XALATAN ) 0.005 % ophthalmic solution 1 drop  1 drop Both Eyes QHS Arnie Lao, MD   1 drop at 05/28/24 2115   losartan  (COZAAR ) tablet 100 mg  100 mg Oral Daily Arnie Lao, MD   100 mg at 05/28/24 1743   menthol -cetylpyridinium (CEPACOL) lozenge 3 mg  1 lozenge Oral PRN Arnie Lao, MD       Or   phenol (CHLORASEPTIC) mouth spray 1 spray  1 spray Mouth/Throat PRN Arnie Lao, MD       metFORMIN  (GLUCOPHAGE -XR) 24 hr tablet 500 mg  500 mg Oral BID WC Blackman, Christopher Y, MD   500 mg at 05/29/24 9604   methocarbamol  (ROBAXIN ) tablet 500 mg  500 mg Oral Q6H PRN Arnie Lao, MD   500 mg at 05/29/24 0109   Or   methocarbamol  (ROBAXIN ) injection 500 mg  500 mg Intravenous Q6H PRN Arnie Lao, MD       metoCLOPramide  (REGLAN ) tablet 5-10 mg  5-10 mg Oral Q8H PRN Arnie Lao, MD       Or   metoCLOPramide  (REGLAN ) injection 5-10 mg  5-10 mg Intravenous Q8H PRN Arnie Lao, MD       ondansetron  (ZOFRAN ) tablet 4 mg  4 mg Oral Q6H PRN Arnie Lao, MD       Or   ondansetron  (ZOFRAN ) injection 4 mg  4 mg Intravenous Q6H PRN Arnie Lao, MD       oxybutynin  (DITROPAN -XL) 24 hr tablet 5 mg  5 mg Oral QHS Arnie Lao, MD   5 mg at 05/28/24 2115   oxyCODONE  (Oxy IR/ROXICODONE ) immediate release tablet 10-15 mg  10-15 mg Oral  Q4H PRN Arnie Lao, MD   10 mg at 05/29/24 0109   oxyCODONE  (Oxy IR/ROXICODONE ) immediate release tablet 5-10 mg  5-10 mg Oral Q4H PRN Arnie Lao, MD   5 mg at 05/28/24 1747   pantoprazole  (PROTONIX ) EC tablet 40 mg  40 mg Oral Daily Blackman, Christopher Y, MD   40 mg at 05/28/24 1743   tamsulosin  (FLOMAX ) capsule 0.8 mg  0.8 mg Oral QHS Arnie Lao, MD   0.8 mg at 05/28/24 2115   torsemide  (DEMADEX ) tablet 20  mg  20 mg Oral Daily Arnie Lao, MD         Discharge Medications: Please see discharge summary for a list of discharge medications.  Relevant Imaging Results:  Relevant Lab Results:   Additional Information SSN [id number]  Amaryllis Junior, LCSW

## 2024-05-29 NOTE — TOC Progression Note (Signed)
 Transition of Care [facility]) - Progression Note    Patient Details  Name: [name] MRN: [id number] Date of Birth: [birth year]  [facility]) CM/SW Contact  Amaryllis Junior, LCSW Phone Number: 05/29/2024, 10:23 AM  Clinical Narrative:    PASRR created [id number]. FL2 completed; awaiting MD signature. SNF referral faxed out awaiting bed offers.        Expected Discharge Plan and Services                                               Social Determinants of Health (SDOH) Interventions SDOH Screenings   Food Insecurity: Food Insecurity Present (05/28/2024)  Housing: High Risk (05/28/2024)  Transportation Needs: No Transportation Needs (05/28/2024)  Utilities: Not At Risk (05/28/2024)  Alcohol  Screen: Low Risk  (05/27/2024)  Depression (PHQ2-9): Low Risk  (05/27/2024)  Financial Resource Strain: High Risk (05/27/2024)  Physical Activity: Insufficiently Active (05/27/2023)  Social Connections: Socially Isolated (05/28/2024)  Stress: No Stress Concern Present (05/27/2024)  Tobacco Use: Medium Risk (05/28/2024)  Health Literacy: Adequate Health Literacy (05/27/2024)    Readmission Risk Interventions     No data to display

## 2024-05-29 NOTE — Progress Notes (Signed)
 Subjective: 1 Day Post-Op Procedure(s) (LRB): ARTHROPLASTY, HIP, TOTAL, ANTERIOR APPROACH (Left) Patient reports pain as moderate.    Objective: Vital signs in last 24 hours: Temp:  [97.4 F (36.3 C)-98.5 F (36.9 C)] 98 F (36.7 C) (05/31 0746) Pulse Rate:  [56-81] 79 (05/31 0746) Resp:  [9-18] 17 (05/31 0746) BP: (103-139)/(45-77) 139/77 (05/31 0746) SpO2:  [94 %-99 %] 96 % (05/31 0746)  Intake/Output from previous day: 05/30 0701 - 05/31 0700 In: 2794.1 [P.O.:840; I.V.:1554.1; IV Piggyback:400] Out: 1650 [Urine:1450; Blood:200] Intake/Output this shift: Total I/O In: 240 [P.O.:240] Out: -   Recent Labs    05/29/24 0350  HGB 11.0*   Recent Labs    05/29/24 0350  WBC 10.0  RBC 3.59*  HCT 32.9*  PLT 228   Recent Labs    05/29/24 0350  NA 134*  K 4.5  CL 101  CO2 22  BUN 13  CREATININE 0.90  GLUCOSE 164*  CALCIUM  8.8*   No results for input(s): "LABPT", "INR" in the last 72 hours.  Dorsiflexion/Plantar flexion intact Incision: scant drainage Compartment soft   Assessment/Plan: 1 Day Post-Op Procedure(s) (LRB): ARTHROPLASTY, HIP, TOTAL, ANTERIOR APPROACH (Left) Discharge to SNF  early next week. Up with PT.    Anticipated LOS equal to or greater than 2 midnights due to - Age 4 and older with one or more of the following:  - Obesity  - Expected need for hospital services (PT, OT, Nursing) required for safe  discharge  - Anticipated need for postoperative skilled nursing care or inpatient rehab  - Active co-morbidities: Diabetes OR   - Unanticipated findings during/Post Surgery: Slow post-op progression: GI, pain control, mobility  - Patient is a high risk of re-admission due to: Barriers to post-acute care (logistical, no family support in home)   Gomez Lathe 05/29/2024, 10:34 AM
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
## 2024-05-30 DIAGNOSIS — J45909 Unspecified asthma, uncomplicated: Secondary | ICD-10-CM | POA: Diagnosis not present

## 2024-05-30 DIAGNOSIS — I10 Essential (primary) hypertension: Secondary | ICD-10-CM | POA: Diagnosis not present

## 2024-05-30 DIAGNOSIS — Z7984 Long term (current) use of oral hypoglycemic drugs: Secondary | ICD-10-CM | POA: Diagnosis not present

## 2024-05-30 DIAGNOSIS — Z7982 Long term (current) use of aspirin: Secondary | ICD-10-CM | POA: Diagnosis not present

## 2024-05-30 DIAGNOSIS — Z79899 Other long term (current) drug therapy: Secondary | ICD-10-CM | POA: Diagnosis not present

## 2024-05-30 DIAGNOSIS — M1612 Unilateral primary osteoarthritis, left hip: Secondary | ICD-10-CM | POA: Diagnosis not present

## 2024-05-30 DIAGNOSIS — Z87891 Personal history of nicotine dependence: Secondary | ICD-10-CM | POA: Diagnosis not present

## 2024-05-30 DIAGNOSIS — Z794 Long term (current) use of insulin: Secondary | ICD-10-CM | POA: Diagnosis not present

## 2024-05-30 DIAGNOSIS — E119 Type 2 diabetes mellitus without complications: Secondary | ICD-10-CM | POA: Diagnosis not present

## 2024-05-30 LAB — URINALYSIS, ROUTINE W REFLEX MICROSCOPIC
Bacteria, UA: NONE SEEN
Bilirubin Urine: NEGATIVE
Glucose, UA: NEGATIVE mg/dL
Ketones, ur: 5 mg/dL — AB
Leukocytes,Ua: NEGATIVE
Nitrite: NEGATIVE
Protein, ur: 100 mg/dL — AB
Specific Gravity, Urine: 1.015 (ref 1.005–1.030)
pH: 5 (ref 5.0–8.0)

## 2024-05-30 LAB — CBC
HCT: 29.4 % — ABNORMAL LOW (ref 39.0–52.0)
Hemoglobin: 10 g/dL — ABNORMAL LOW (ref 13.0–17.0)
MCH: 30.8 pg (ref 26.0–34.0)
MCHC: 34 g/dL (ref 30.0–36.0)
MCV: 90.5 fL (ref 80.0–100.0)
Platelets: 215 10*3/uL (ref 150–400)
RBC: 3.25 MIL/uL — ABNORMAL LOW (ref 4.22–5.81)
RDW: 13.4 % (ref 11.5–15.5)
WBC: 12.1 10*3/uL — ABNORMAL HIGH (ref 4.0–10.5)
nRBC: 0 % (ref 0.0–0.2)

## 2024-05-30 LAB — GLUCOSE, CAPILLARY
Glucose-Capillary: 117 mg/dL — ABNORMAL HIGH (ref 70–99)
Glucose-Capillary: 160 mg/dL — ABNORMAL HIGH (ref 70–99)
Glucose-Capillary: 136 mg/dL — ABNORMAL HIGH (ref 70–99)

## 2024-05-30 MED ORDER — NAPHAZOLINE-GLYCERIN 0.012-0.25 % OP SOLN
1.0000 [drp] | Freq: Four times a day (QID) | OPHTHALMIC | Status: DC | PRN
  Administered 2024-05-30 – 2024-05-31 (×2): 1 [drp] via OPHTHALMIC
  Filled 2024-05-30: qty 15
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
## 2024-05-30 NOTE — Plan of Care (Signed)
  Problem: Education: Goal: Knowledge of General Education information will improve Description: Including pain rating scale, medication(s)/side effects and non-pharmacologic comfort measures Outcome: Progressing   Problem: Health Behavior/Discharge Planning: Goal: Ability to manage health-related needs will improve Outcome: Progressing   Problem: Clinical Measurements: Goal: Ability to maintain clinical measurements within normal limits will improve Outcome: Progressing Goal: Will remain free from infection Outcome: Progressing Goal: Diagnostic test results will improve Outcome: Progressing Goal: Respiratory complications will improve Outcome: Progressing Goal: Cardiovascular complication will be avoided Outcome: Progressing   Problem: Activity: Goal: Risk for activity intolerance will decrease Outcome: Progressing   Problem: Nutrition: Goal: Adequate nutrition will be maintained Outcome: Adequate for Discharge   Problem: Coping: Goal: Level of anxiety will decrease Outcome: Progressing   Problem: Elimination: Goal: Will not experience complications related to bowel motility Outcome: Progressing Goal: Will not experience complications related to urinary retention Outcome: Completed/Met   Problem: Pain Managment: Goal: General experience of comfort will improve and/or be controlled Outcome: Progressing   Problem: Safety: Goal: Ability to remain free from injury will improve Outcome: Progressing   Problem: Skin Integrity: Goal: Risk for impaired skin integrity will decrease Outcome: Progressing   Problem: Education: Goal: Knowledge of the prescribed therapeutic regimen will improve Outcome: Progressing Goal: Understanding of discharge needs will improve Outcome: Progressing Goal: Individualized Educational Video(s) Outcome: Progressing   Problem: Activity: Goal: Ability to avoid complications of mobility impairment will improve Outcome: Progressing Goal:  Ability to tolerate increased activity will improve Outcome: Adequate for Discharge   Problem: Clinical Measurements: Goal: Postoperative complications will be avoided or minimized Outcome: Progressing   Problem: Pain Management: Goal: Pain level will decrease with appropriate interventions Outcome: Adequate for Discharge   Problem: Skin Integrity: Goal: Will show signs of wound healing Outcome: Progressing
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
## 2024-05-30 NOTE — Plan of Care (Signed)
  Problem: Education: Goal: Knowledge of General Education information will improve Description: Including pain rating scale, medication(s)/side effects and non-pharmacologic comfort measures Outcome: Progressing   Problem: Health Behavior/Discharge Planning: Goal: Ability to manage health-related needs will improve Outcome: Progressing   Problem: Clinical Measurements: Goal: Ability to maintain clinical measurements within normal limits will improve Outcome: Progressing Goal: Will remain free from infection Outcome: Progressing Goal: Diagnostic test results will improve Outcome: Progressing Goal: Respiratory complications will improve Outcome: Progressing Goal: Cardiovascular complication will be avoided Outcome: Progressing   Problem: Activity: Goal: Risk for activity intolerance will decrease Outcome: Progressing   Problem: Nutrition: Goal: Adequate nutrition will be maintained Outcome: Progressing   Problem: Coping: Goal: Level of anxiety will decrease Outcome: Progressing   Problem: Elimination: Goal: Will not experience complications related to bowel motility Outcome: Progressing   Problem: Pain Managment: Goal: General experience of comfort will improve and/or be controlled Outcome: Progressing   Problem: Safety: Goal: Ability to remain free from injury will improve Outcome: Progressing   Problem: Skin Integrity: Goal: Risk for impaired skin integrity will decrease Outcome: Progressing   Problem: Education: Goal: Knowledge of the prescribed therapeutic regimen will improve Outcome: Progressing Goal: Understanding of discharge needs will improve Outcome: Progressing Goal: Individualized Educational Video(s) Outcome: Completed/Met   Problem: Activity: Goal: Ability to avoid complications of mobility impairment will improve Outcome: Progressing Goal: Ability to tolerate increased activity will improve Outcome: Progressing   Problem: Clinical  Measurements: Goal: Postoperative complications will be avoided or minimized Outcome: Progressing   Problem: Pain Management: Goal: Pain level will decrease with appropriate interventions Outcome: Adequate for Discharge   Problem: Skin Integrity: Goal: Will show signs of wound healing Outcome: Progressing

## 2024-05-30 NOTE — Progress Notes (Signed)
 Starting around 0415, the patient pulled off his gown and cover and removed his pulse ox. Entering his room, the patient states, " I'm getting up so I can go down to the garage to shut off that car horn."  Attempts to reorient him were unsuccessful.  As I repositioned his legs back onto the bed, he became very belligerent cussing and yelling about me coming into his house and "messing with him".  Further attempts to reorient him to time and place were still unsuccessful.  Side rails were up and bed alarm was engaged.

## 2024-05-30 NOTE — Progress Notes (Signed)
 During report pt was found to be sitting on the edge of the bed unassisted.  States he had a death in his family and he was leaving.  Unable, or unwilling to follow simple instructions. Very belligerent and combative.  Pulled up into bed with the assistance of Donnal Fusi, RN.  Bed alarm on, call light within reach, side rails up.  Pt's cell phone given to him so he could call family.

## 2024-05-30 NOTE — Progress Notes (Signed)
 Patient ID: [name], male   DOB: [birth year], 89 y.o.   MRN: [id number] Patient is status post left total hip arthroplasty.  He is making slow progress with therapy.  Orders placed for occupational therapy.  Patient request eyedrops and order placed for eyedrops.  Plan for discharge when safe with therapy.

## 2024-05-31 ENCOUNTER — Encounter (HOSPITAL_COMMUNITY): Payer: Self-pay | Admitting: Orthopaedic Surgery

## 2024-05-31 DIAGNOSIS — Z794 Long term (current) use of insulin: Secondary | ICD-10-CM | POA: Diagnosis not present

## 2024-05-31 DIAGNOSIS — Z7901 Long term (current) use of anticoagulants: Secondary | ICD-10-CM | POA: Diagnosis not present

## 2024-05-31 DIAGNOSIS — R4182 Altered mental status, unspecified: Secondary | ICD-10-CM | POA: Diagnosis present

## 2024-05-31 DIAGNOSIS — R0689 Other abnormalities of breathing: Secondary | ICD-10-CM | POA: Diagnosis not present

## 2024-05-31 DIAGNOSIS — H409 Unspecified glaucoma: Secondary | ICD-10-CM | POA: Diagnosis not present

## 2024-05-31 DIAGNOSIS — G9340 Encephalopathy, unspecified: Secondary | ICD-10-CM | POA: Diagnosis not present

## 2024-05-31 DIAGNOSIS — K219 Gastro-esophageal reflux disease without esophagitis: Secondary | ICD-10-CM | POA: Diagnosis not present

## 2024-05-31 DIAGNOSIS — M1612 Unilateral primary osteoarthritis, left hip: Secondary | ICD-10-CM | POA: Diagnosis not present

## 2024-05-31 DIAGNOSIS — A419 Sepsis, unspecified organism: Secondary | ICD-10-CM | POA: Diagnosis not present

## 2024-05-31 DIAGNOSIS — Z743 Need for continuous supervision: Secondary | ICD-10-CM | POA: Diagnosis not present

## 2024-05-31 DIAGNOSIS — M159 Polyosteoarthritis, unspecified: Secondary | ICD-10-CM | POA: Diagnosis not present

## 2024-05-31 DIAGNOSIS — J122 Parainfluenza virus pneumonia: Secondary | ICD-10-CM | POA: Diagnosis not present

## 2024-05-31 DIAGNOSIS — F411 Generalized anxiety disorder: Secondary | ICD-10-CM | POA: Diagnosis not present

## 2024-05-31 DIAGNOSIS — E872 Acidosis, unspecified: Secondary | ICD-10-CM | POA: Diagnosis not present

## 2024-05-31 DIAGNOSIS — I10 Essential (primary) hypertension: Secondary | ICD-10-CM | POA: Diagnosis not present

## 2024-05-31 DIAGNOSIS — R531 Weakness: Secondary | ICD-10-CM | POA: Diagnosis not present

## 2024-05-31 DIAGNOSIS — R6510 Systemic inflammatory response syndrome (SIRS) of non-infectious origin without acute organ dysfunction: Secondary | ICD-10-CM | POA: Diagnosis not present

## 2024-05-31 DIAGNOSIS — Z96642 Presence of left artificial hip joint: Secondary | ICD-10-CM | POA: Diagnosis not present

## 2024-05-31 DIAGNOSIS — M25552 Pain in left hip: Secondary | ICD-10-CM | POA: Diagnosis not present

## 2024-05-31 DIAGNOSIS — D649 Anemia, unspecified: Secondary | ICD-10-CM | POA: Diagnosis not present

## 2024-05-31 DIAGNOSIS — H401190 Primary open-angle glaucoma, unspecified eye, stage unspecified: Secondary | ICD-10-CM | POA: Diagnosis not present

## 2024-05-31 DIAGNOSIS — R6889 Other general symptoms and signs: Secondary | ICD-10-CM | POA: Diagnosis not present

## 2024-05-31 DIAGNOSIS — G9341 Metabolic encephalopathy: Secondary | ICD-10-CM | POA: Diagnosis not present

## 2024-05-31 DIAGNOSIS — Z9189 Other specified personal risk factors, not elsewhere classified: Secondary | ICD-10-CM | POA: Diagnosis not present

## 2024-05-31 DIAGNOSIS — T17920A Food in respiratory tract, part unspecified causing asphyxiation, initial encounter: Secondary | ICD-10-CM | POA: Diagnosis not present

## 2024-05-31 DIAGNOSIS — Z886 Allergy status to analgesic agent status: Secondary | ICD-10-CM | POA: Diagnosis not present

## 2024-05-31 DIAGNOSIS — R32 Unspecified urinary incontinence: Secondary | ICD-10-CM | POA: Diagnosis not present

## 2024-05-31 DIAGNOSIS — N400 Enlarged prostate without lower urinary tract symptoms: Secondary | ICD-10-CM | POA: Diagnosis not present

## 2024-05-31 DIAGNOSIS — Z79899 Other long term (current) drug therapy: Secondary | ICD-10-CM | POA: Diagnosis not present

## 2024-05-31 DIAGNOSIS — Z8679 Personal history of other diseases of the circulatory system: Secondary | ICD-10-CM | POA: Diagnosis not present

## 2024-05-31 DIAGNOSIS — Z87891 Personal history of nicotine dependence: Secondary | ICD-10-CM | POA: Diagnosis not present

## 2024-05-31 DIAGNOSIS — Z7401 Bed confinement status: Secondary | ICD-10-CM | POA: Diagnosis not present

## 2024-05-31 DIAGNOSIS — I739 Peripheral vascular disease, unspecified: Secondary | ICD-10-CM | POA: Diagnosis not present

## 2024-05-31 DIAGNOSIS — J45909 Unspecified asthma, uncomplicated: Secondary | ICD-10-CM | POA: Diagnosis not present

## 2024-05-31 DIAGNOSIS — Z7982 Long term (current) use of aspirin: Secondary | ICD-10-CM | POA: Diagnosis not present

## 2024-05-31 DIAGNOSIS — Z471 Aftercare following joint replacement surgery: Secondary | ICD-10-CM | POA: Diagnosis not present

## 2024-05-31 DIAGNOSIS — R404 Transient alteration of awareness: Secondary | ICD-10-CM | POA: Diagnosis not present

## 2024-05-31 DIAGNOSIS — Z7984 Long term (current) use of oral hypoglycemic drugs: Secondary | ICD-10-CM | POA: Diagnosis not present

## 2024-05-31 DIAGNOSIS — E119 Type 2 diabetes mellitus without complications: Secondary | ICD-10-CM | POA: Diagnosis not present

## 2024-05-31 DIAGNOSIS — E785 Hyperlipidemia, unspecified: Secondary | ICD-10-CM | POA: Diagnosis not present

## 2024-05-31 DIAGNOSIS — R509 Fever, unspecified: Secondary | ICD-10-CM | POA: Diagnosis not present

## 2024-05-31 DIAGNOSIS — E782 Mixed hyperlipidemia: Secondary | ICD-10-CM | POA: Diagnosis not present

## 2024-05-31 DIAGNOSIS — L84 Corns and callosities: Secondary | ICD-10-CM | POA: Diagnosis not present

## 2024-05-31 LAB — GLUCOSE, CAPILLARY
Glucose-Capillary: 123 mg/dL — ABNORMAL HIGH (ref 70–99)
Glucose-Capillary: 111 mg/dL — ABNORMAL HIGH (ref 70–99)

## 2024-05-31 MED ORDER — OXYCODONE HCL 5 MG PO TABS: 5.0000 mg | ORAL_TABLET | ORAL | 0 refills | Status: DC | PRN

## 2024-05-31 MED ORDER — ASPIRIN 81 MG PO CHEW: 81.0000 mg | CHEWABLE_TABLET | Freq: Two times a day (BID) | ORAL | 0 refills | Status: AC

## 2024-05-31 NOTE — Progress Notes (Signed)
 Called [facility] twice to give the report. [facility] kept putting on hold and did not respond back.

## 2024-05-31 NOTE — Progress Notes (Signed)
 Called [facility] to give report to the nurse. Unable to get in touch. No voicemail set up to leave a message. Will try again later.

## 2024-05-31 NOTE — Discharge Instructions (Signed)
 Dental Antibiotics: INSTRUCTIONS AFTER JOINT REPLACEMENT   Remove items at home which could result in a fall. This includes throw rugs or furniture in walking pathways ICE to the affected joint every three hours while awake for 30 minutes at a time, for at least the first 3-5 days, and then as needed for pain and swelling.  Continue to use ice for pain and swelling. You may notice swelling that will progress down to the foot and ankle.  This is normal after surgery.  Elevate your leg when you are not up walking on it.   Continue to use the breathing machine you got in the hospital (incentive spirometer) which will help keep your temperature down.  It is common for your temperature to cycle up and down following surgery, especially at night when you are not up moving around and exerting yourself.  The breathing machine keeps your lungs expanded and your temperature down.   DIET:  As you were doing prior to hospitalization, we recommend a well-balanced diet.  DRESSING / WOUND CARE / SHOWERING  Keep the surgical dressing until follow up.  The dressing is water  proof, so you can shower without any extra covering.  IF THE DRESSING FALLS OFF or the wound gets wet inside, change the dressing with sterile gauze.  Please use good hand washing techniques before changing the dressing.  Do not use any lotions or creams on the incision until instructed by your surgeon.    ACTIVITY  Increase activity slowly as tolerated, but follow the weight bearing instructions below.   No driving for 6 weeks or until further direction given by your physician.  You cannot drive while taking narcotics.  No lifting or carrying greater than 10 lbs. until further directed by your surgeon. Avoid periods of inactivity such as sitting longer than an hour when not asleep. This helps prevent blood clots.  You may return to work once you are authorized by your doctor.     WEIGHT BEARING   Weight bearing as tolerated with assist  device (walker, cane, etc) as directed, use it as long as suggested by your surgeon or therapist, typically at least 4-6 weeks.   EXERCISES  Results after joint replacement surgery are often greatly improved when you follow the exercise, range of motion and muscle strengthening exercises prescribed by your doctor. Safety measures are also important to protect the joint from further injury. Any time any of these exercises cause you to have increased pain or swelling, decrease what you are doing until you are comfortable again and then slowly increase them. If you have problems or questions, call your caregiver or physical therapist for advice.   Rehabilitation is important following a joint replacement. After just a few days of immobilization, the muscles of the leg can become weakened and shrink (atrophy).  These exercises are designed to build up the tone and strength of the thigh and leg muscles and to improve motion. Often times heat used for twenty to thirty minutes before working out will loosen up your tissues and help with improving the range of motion but do not use heat for the first two weeks following surgery (sometimes heat can increase post-operative swelling).   These exercises can be done on a training (exercise) mat, on the floor, on a table or on a bed. Use whatever works the best and is most comfortable for you.    Use music or television while you are exercising so that the exercises are a pleasant break  in your day. This will make your life better with the exercises acting as a break in your routine that you can look forward to.   Perform all exercises about fifteen times, three times per day or as directed.  You should exercise both the operative leg and the other leg as well.  Exercises include:   Quad Sets - Tighten up the muscle on the front of the thigh (Quad) and hold for 5-10 seconds.   Straight Leg Raises - With your knee straight (if you were given a brace, keep it on),  lift the leg to 60 degrees, hold for 3 seconds, and slowly lower the leg.  Perform this exercise against resistance later as your leg gets stronger.  Leg Slides: Lying on your back, slowly slide your foot toward your buttocks, bending your knee up off the floor (only go as far as is comfortable). Then slowly slide your foot back down until your leg is flat on the floor again.  Angel Wings: Lying on your back spread your legs to the side as far apart as you can without causing discomfort.  Hamstring Strength:  Lying on your back, push your heel against the floor with your leg straight by tightening up the muscles of your buttocks.  Repeat, but this time bend your knee to a comfortable angle, and push your heel against the floor.  You may put a pillow under the heel to make it more comfortable if necessary.   A rehabilitation program following joint replacement surgery can speed recovery and prevent re-injury in the future due to weakened muscles. Contact your doctor or a physical therapist for more information on knee rehabilitation.    CONSTIPATION  Constipation is defined medically as fewer than three stools per week and severe constipation as less than one stool per week.  Even if you have a regular bowel pattern at home, your normal regimen is likely to be disrupted due to multiple reasons following surgery.  Combination of anesthesia, postoperative narcotics, change in appetite and fluid intake all can affect your bowels.   YOU MUST use at least one of the following options; they are listed in order of increasing strength to get the job done.  They are all available over the counter, and you may need to use some, POSSIBLY even all of these options:    Drink plenty of fluids (prune juice may be helpful) and high fiber foods Colace 100 mg by mouth twice a day  Senokot for constipation as directed and as needed Dulcolax (bisacodyl), take with full glass of water   Miralax (polyethylene glycol) once  or twice a day as needed.  If you have tried all these things and are unable to have a bowel movement in the first 3-4 days after surgery call either your surgeon or your primary doctor.    If you experience loose stools or diarrhea, hold the medications until you stool forms back up.  If your symptoms do not get better within 1 week or if they get worse, check with your doctor.  If you experience "the worst abdominal pain ever" or develop nausea or vomiting, please contact the office immediately for further recommendations for treatment.   ITCHING:  If you experience itching with your medications, try taking only a single pain pill, or even half a pain pill at a time.  You can also use Benadryl  over the counter for itching or also to help with sleep.   TED HOSE STOCKINGS:  Use stockings  on both legs until for at least 2 weeks or as directed by physician office. They may be removed at night for sleeping.  MEDICATIONS:  See your medication summary on the "After Visit Summary" that nursing will review with you.  You may have some home medications which will be placed on hold until you complete the course of blood thinner medication.  It is important for you to complete the blood thinner medication as prescribed.  PRECAUTIONS:  If you experience chest pain or shortness of breath - call 911 immediately for transfer to the hospital emergency department.   If you develop a fever greater that 101 F, purulent drainage from wound, increased redness or drainage from wound, foul odor from the wound/dressing, or calf pain - CONTACT YOUR SURGEON.                                                   FOLLOW-UP APPOINTMENTS:  If you do not already have a post-op appointment, please call the office for an appointment to be seen by your surgeon.  Guidelines for how soon to be seen are listed in your "After Visit Summary", but are typically between 1-4 weeks after surgery.  OTHER INSTRUCTIONS:   Knee Replacement:  Do  not place pillow under knee, focus on keeping the knee straight while resting. CPM instructions: 0-90 degrees, 2 hours in the morning, 2 hours in the afternoon, and 2 hours in the evening. Place foam block, curve side up under heel at all times except when in CPM or when walking.  DO NOT modify, tear, cut, or change the foam block in any way.  POST-OPERATIVE OPIOID TAPER INSTRUCTIONS: It is important to wean off of your opioid medication as soon as possible. If you do not need pain medication after your surgery it is ok to stop day one. Opioids include: Codeine, Hydrocodone (Norco, Vicodin), Oxycodone (Percocet, oxycontin ) and hydromorphone  amongst others.  Long term and even short term use of opiods can cause: Increased pain response Dependence Constipation Depression Respiratory depression And more.  Withdrawal symptoms can include Flu like symptoms Nausea, vomiting And more Techniques to manage these symptoms Hydrate well Eat regular healthy meals Stay active Use relaxation techniques(deep breathing, meditating, yoga) Do Not substitute Alcohol  to help with tapering If you have been on opioids for less than two weeks and do not have pain than it is ok to stop all together.  Plan to wean off of opioids This plan should start within one week post op of your joint replacement. Maintain the same interval or time between taking each dose and first decrease the dose.  Cut the total daily intake of opioids by one tablet each day Next start to increase the time between doses. The last dose that should be eliminated is the evening dose.   MAKE SURE YOU:  Understand these instructions.  Get help right away if you are not doing well or get worse.    Thank you for letting us  be a part of your medical care team.  It is a privilege we respect greatly.  We hope these instructions will help you stay on track for a fast and full recovery!      In most cases prophylactic antibiotics for Dental  procdeures after total joint surgery are not necessary.  Exceptions are as follows:  1. History of prior  total joint infection  2. Severely immunocompromised (Organ Transplant, cancer chemotherapy, Rheumatoid biologic meds such as Humera)  3. Poorly controlled diabetes (A1C &gt; 8.0, blood glucose over 200)  If you have one of these conditions, contact your surgeon for an antibiotic prescription, prior to your dental procedure.

## 2024-05-31 NOTE — TOC Transition Note (Signed)
 Transition of Care [facility]) - Discharge Note   Patient Details  Name: [name] MRN: [id number] Date of Birth: [birth year]  [facility] CM/SW Contact:  Bari Leys, RN Phone Number: 05/31/2024, 2:33 PM   Clinical Narrative: DC order to SN[name] RM [phone], Call Report [phone], pt notified, NCM called to pt's friend, Image Frosty Jews, no answer, no vm available. [facility] for transport. No further TOC needs.       Final next level of care: Skilled Nursing Facility Barriers to Discharge: Barriers Resolved   Patient Goals and CMS Choice Patient states their goals for this hospitalization and ongoing recovery are:: return home CMS Medicare.gov Compare Post Acute Care list provided to:: Patient Represenative (must comment) (HUGHES,IMAGE (Friend)  [phone] (Mobile))        Discharge Placement              Patient chooses bed at: [facility] Patient to be transferred to facility by: [facility] Name of family member notified: Apolonio Bay (Friend)  [phone] (Mobile) Patient and family notified of of transfer: 05/31/24  Discharge Plan and Services Additional resources added to the After Visit Summary for                                       Social Drivers of Health (SDOH) Interventions SDOH Screenings   Food Insecurity: Food Insecurity Present (05/28/2024)  Housing: High Risk (05/28/2024)  Transportation Needs: No Transportation Needs (05/28/2024)  Utilities: Not At Risk (05/28/2024)  Alcohol  Screen: Low Risk  (05/27/2024)  Depression (PHQ2-9): Low Risk  (05/27/2024)  Financial Resource Strain: High Risk (05/27/2024)  Physical Activity: Insufficiently Active (05/27/2023)  Social Connections: Socially Isolated (05/28/2024)  Stress: No Stress Concern Present (05/27/2024)  Tobacco Use: Medium Risk (05/28/2024)  Health Literacy: Adequate Health Literacy (05/27/2024)     Readmission Risk Interventions     No data to display
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
## 2024-05-31 NOTE — Plan of Care (Signed)
 Patient discharging to [facility] via [facility]. AVS placed in discharge packet and facility contacted by primary RN. Ara Knee, RN 05/31/24 3:57 PM

## 2024-05-31 NOTE — TOC Progression Note (Addendum)
 Transition of Care [facility]) - Progression Note    Patient Details  Name: [name] MRN: [id number] Date of Birth: 10/14/1933  Transition of Care [facility]) CM/SW Contact  Bari Leys, RN Phone Number: 05/31/2024, 10:10 AM  Clinical Narrative:  Met with patient at bedside to review short term rehab/SNF bed offers [name], [facility], [place], [place], [place], [place]), pt reports he lives in [place], [place], near [place]. NCM reviewed with patient that no SNF bed offers were received near his home town, only in [place] area. NCM attempted to review SNF bed offers, patient requested NCM contact his friend Image Huges, to review. NCM attempted to explain MOON, didn't appear that patient understood explanation of MOON, patient continued to talk about his medical hx and medical hx of his family during explanation. MOON completed, copy placed in patient's hard chart. .   -10:31am NCM called to patient's friend, Image Huges, introduced self and reason for call, short term rehab/SNF bed offers reviewed with Mr Frosty Jews, accepted bed offer at [facility], [facility], admit coord, notified. NCM will initiate auth   -1:11pm Short term rehab/SNF [facility] initiated via [facility], Auth ID [id number], days approved 05/31/2024-06/02/2024.           Expected Discharge Plan and Services         Expected Discharge Date: 05/31/24                                     Social Determinants of Health (SDOH) Interventions SDOH Screenings   Food Insecurity: Food Insecurity Present (05/28/2024)  Housing: High Risk (05/28/2024)  Transportation Needs: No Transportation Needs (05/28/2024)  Utilities: Not At Risk (05/28/2024)  Alcohol  Screen: Low Risk  (05/27/2024)  Depression (PHQ2-9): Low Risk  (05/27/2024)  Financial Resource Strain: High Risk (05/27/2024)  Physical Activity: Insufficiently Active (05/27/2023)  Social Connections: Socially Isolated (05/28/2024)  Stress: No Stress Concern  Present (05/27/2024)  Tobacco Use: Medium Risk (05/28/2024)  Health Literacy: Adequate Health Literacy (05/27/2024)    Readmission Risk Interventions     No data to display
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
## 2024-05-31 NOTE — Progress Notes (Signed)
 Subjective: 3 Days Post-Op Procedure(s) (LRB): ARTHROPLASTY, HIP, TOTAL, ANTERIOR APPROACH (Left) Patient reports pain as moderate.  Awake and alert .  States he ate too many sweets yesterday and caused an upset stomach. No nausea or vomiting this AM  Objective: Vital signs in last 24 hours: Temp:  [98.7 F (37.1 C)-101 F (38.3 C)] 98.9 F (37.2 C) (06/02 0631) Pulse Rate:  [87-108] 87 (06/02 0631) Resp:  [16-20] 16 (06/02 0631) BP: (109-148)/(54-62) 140/60 (06/02 0631) SpO2:  [95 %-97 %] 96 % (06/02 0631)  Intake/Output from previous day: 06/01 0701 - 06/02 0700 In: 120 [P.O.:120] Out: 1400 [Urine:1400] Intake/Output this shift: No intake/output data recorded.  Recent Labs    05/29/24 0350 05/30/24 0808  HGB 11.0* 10.0*   Recent Labs    05/29/24 0350 05/30/24 0808  WBC 10.0 12.1*  RBC 3.59* 3.25*  HCT 32.9* 29.4*  PLT 228 215   Recent Labs    05/29/24 0350  NA 134*  K 4.5  CL 101  CO2 22  BUN 13  CREATININE 0.90  GLUCOSE 164*  CALCIUM  8.8*   No results for input(s): "LABPT", "INR" in the last 72 hours.  Dorsiflexion/Plantar flexion intact Incision: scant drainage Compartment soft   Assessment/Plan: 3 Days Post-Op Procedure(s) (LRB): ARTHROPLASTY, HIP, TOTAL, ANTERIOR APPROACH (Left) Up with therapy Discharge to SNF when bed available.       Laquisha Northcraft 05/31/2024, 7:51 AM

## 2024-06-01 ENCOUNTER — Ambulatory Visit: Admitting: Family

## 2024-06-02 ENCOUNTER — Encounter: Payer: Self-pay | Admitting: Family

## 2024-06-02 NOTE — TOC Progression Note (Signed)
 Transition of Care [facility]) - Progression Note    Patient Details  Name: [name] MRN: [id number] Date of Birth: 09/06/1933  [facility]) CM/SW Contact  Bari Leys, RN Phone Number: 06/02/2024, 10:00 AM  Clinical Narrative:   LATE ENTRY FROM 05/31/2024 . NCM attempted to call patient's dtr, Margia Koller, x 3, to review/explain MOON,  no answer, no voicemail set up.        Barriers to Discharge: Barriers Resolved  Expected Discharge Plan and Services         Expected Discharge Date: 05/31/24                                     Social Determinants of Health (SDOH) Interventions SDOH Screenings   Food Insecurity: Food Insecurity Present (05/28/2024)  Housing: High Risk (05/28/2024)  Transportation Needs: No Transportation Needs (05/28/2024)  Utilities: Not At Risk (05/28/2024)  Alcohol  Screen: Low Risk  (05/27/2024)  Depression (PHQ2-9): Low Risk  (05/27/2024)  Financial Resource Strain: High Risk (05/27/2024)  Physical Activity: Insufficiently Active (05/27/2023)  Social Connections: Socially Isolated (05/28/2024)  Stress: No Stress Concern Present (05/27/2024)  Tobacco Use: Medium Risk (05/28/2024)  Health Literacy: Adequate Health Literacy (05/27/2024)    Readmission Risk Interventions     No data to display

## 2024-06-04 DIAGNOSIS — I10 Essential (primary) hypertension: Secondary | ICD-10-CM | POA: Diagnosis not present

## 2024-06-10 DIAGNOSIS — L84 Corns and callosities: Secondary | ICD-10-CM | POA: Diagnosis not present

## 2024-06-10 DIAGNOSIS — K219 Gastro-esophageal reflux disease without esophagitis: Secondary | ICD-10-CM | POA: Diagnosis not present

## 2024-06-10 DIAGNOSIS — E785 Hyperlipidemia, unspecified: Secondary | ICD-10-CM | POA: Diagnosis not present

## 2024-06-10 DIAGNOSIS — Z794 Long term (current) use of insulin: Secondary | ICD-10-CM | POA: Diagnosis not present

## 2024-06-10 DIAGNOSIS — Z886 Allergy status to analgesic agent status: Secondary | ICD-10-CM | POA: Diagnosis not present

## 2024-06-10 DIAGNOSIS — H409 Unspecified glaucoma: Secondary | ICD-10-CM | POA: Diagnosis not present

## 2024-06-10 DIAGNOSIS — Z9189 Other specified personal risk factors, not elsewhere classified: Secondary | ICD-10-CM | POA: Diagnosis not present

## 2024-06-10 DIAGNOSIS — Z7984 Long term (current) use of oral hypoglycemic drugs: Secondary | ICD-10-CM | POA: Diagnosis not present

## 2024-06-10 DIAGNOSIS — I10 Essential (primary) hypertension: Secondary | ICD-10-CM | POA: Diagnosis not present

## 2024-06-10 DIAGNOSIS — M1612 Unilateral primary osteoarthritis, left hip: Secondary | ICD-10-CM | POA: Diagnosis not present

## 2024-06-13 ENCOUNTER — Emergency Department (HOSPITAL_COMMUNITY)

## 2024-06-13 ENCOUNTER — Other Ambulatory Visit: Payer: Self-pay

## 2024-06-13 ENCOUNTER — Encounter (HOSPITAL_COMMUNITY): Payer: Self-pay

## 2024-06-13 ENCOUNTER — Observation Stay (HOSPITAL_COMMUNITY)
Admission: EM | Admit: 2024-06-13 | Discharge: 2024-06-16 | Disposition: A | Discharge status: Skilled Nursing Facility | Attending: Internal Medicine | Admitting: Internal Medicine

## 2024-06-13 DIAGNOSIS — Z7901 Long term (current) use of anticoagulants: Secondary | ICD-10-CM | POA: Diagnosis not present

## 2024-06-13 DIAGNOSIS — E785 Hyperlipidemia, unspecified: Secondary | ICD-10-CM | POA: Insufficient documentation

## 2024-06-13 DIAGNOSIS — G9341 Metabolic encephalopathy: Secondary | ICD-10-CM | POA: Diagnosis not present

## 2024-06-13 DIAGNOSIS — F1320 Sedative, hypnotic or anxiolytic dependence, uncomplicated: Secondary | ICD-10-CM

## 2024-06-13 DIAGNOSIS — Z79899 Other long term (current) drug therapy: Secondary | ICD-10-CM | POA: Diagnosis not present

## 2024-06-13 DIAGNOSIS — R509 Fever, unspecified: Principal | ICD-10-CM | POA: Diagnosis present

## 2024-06-13 DIAGNOSIS — J122 Parainfluenza virus pneumonia: Secondary | ICD-10-CM | POA: Insufficient documentation

## 2024-06-13 DIAGNOSIS — E782 Mixed hyperlipidemia: Secondary | ICD-10-CM | POA: Diagnosis not present

## 2024-06-13 DIAGNOSIS — Z794 Long term (current) use of insulin: Secondary | ICD-10-CM | POA: Insufficient documentation

## 2024-06-13 DIAGNOSIS — E119 Type 2 diabetes mellitus without complications: Secondary | ICD-10-CM | POA: Insufficient documentation

## 2024-06-13 DIAGNOSIS — Z743 Need for continuous supervision: Secondary | ICD-10-CM | POA: Diagnosis not present

## 2024-06-13 DIAGNOSIS — G9340 Encephalopathy, unspecified: Secondary | ICD-10-CM | POA: Diagnosis present

## 2024-06-13 DIAGNOSIS — R32 Unspecified urinary incontinence: Secondary | ICD-10-CM | POA: Diagnosis not present

## 2024-06-13 DIAGNOSIS — E872 Acidosis, unspecified: Secondary | ICD-10-CM | POA: Diagnosis not present

## 2024-06-13 DIAGNOSIS — R6510 Systemic inflammatory response syndrome (SIRS) of non-infectious origin without acute organ dysfunction: Secondary | ICD-10-CM | POA: Diagnosis not present

## 2024-06-13 DIAGNOSIS — N39490 Overflow incontinence: Secondary | ICD-10-CM

## 2024-06-13 DIAGNOSIS — D649 Anemia, unspecified: Secondary | ICD-10-CM | POA: Insufficient documentation

## 2024-06-13 DIAGNOSIS — F419 Anxiety disorder, unspecified: Secondary | ICD-10-CM

## 2024-06-13 DIAGNOSIS — I10 Essential (primary) hypertension: Secondary | ICD-10-CM | POA: Diagnosis not present

## 2024-06-13 DIAGNOSIS — Z7982 Long term (current) use of aspirin: Secondary | ICD-10-CM | POA: Insufficient documentation

## 2024-06-13 DIAGNOSIS — J45909 Unspecified asthma, uncomplicated: Secondary | ICD-10-CM | POA: Diagnosis not present

## 2024-06-13 DIAGNOSIS — R404 Transient alteration of awareness: Secondary | ICD-10-CM | POA: Diagnosis not present

## 2024-06-13 DIAGNOSIS — R0689 Other abnormalities of breathing: Secondary | ICD-10-CM | POA: Diagnosis not present

## 2024-06-13 DIAGNOSIS — Z96642 Presence of left artificial hip joint: Secondary | ICD-10-CM | POA: Diagnosis not present

## 2024-06-13 DIAGNOSIS — R6889 Other general symptoms and signs: Secondary | ICD-10-CM | POA: Diagnosis not present

## 2024-06-13 DIAGNOSIS — Z87891 Personal history of nicotine dependence: Secondary | ICD-10-CM | POA: Diagnosis not present

## 2024-06-13 DIAGNOSIS — Z8679 Personal history of other diseases of the circulatory system: Secondary | ICD-10-CM

## 2024-06-13 DIAGNOSIS — F411 Generalized anxiety disorder: Secondary | ICD-10-CM | POA: Diagnosis present

## 2024-06-13 DIAGNOSIS — R4182 Altered mental status, unspecified: Secondary | ICD-10-CM | POA: Diagnosis present

## 2024-06-13 DIAGNOSIS — T17920A Food in respiratory tract, part unspecified causing asphyxiation, initial encounter: Secondary | ICD-10-CM | POA: Diagnosis not present

## 2024-06-13 DIAGNOSIS — N400 Enlarged prostate without lower urinary tract symptoms: Secondary | ICD-10-CM | POA: Diagnosis not present

## 2024-06-13 DIAGNOSIS — M25552 Pain in left hip: Secondary | ICD-10-CM | POA: Diagnosis not present

## 2024-06-13 DIAGNOSIS — A419 Sepsis, unspecified organism: Secondary | ICD-10-CM | POA: Diagnosis not present

## 2024-06-13 DIAGNOSIS — Z7984 Long term (current) use of oral hypoglycemic drugs: Secondary | ICD-10-CM | POA: Insufficient documentation

## 2024-06-13 DIAGNOSIS — B348 Other viral infections of unspecified site: Secondary | ICD-10-CM

## 2024-06-13 DIAGNOSIS — Z96649 Presence of unspecified artificial hip joint: Secondary | ICD-10-CM

## 2024-06-13 LAB — CBC WITH DIFFERENTIAL/PLATELET
Abs Immature Granulocytes: 0.04 10*3/uL (ref 0.00–0.07)
Basophils Absolute: 0 10*3/uL (ref 0.0–0.1)
Basophils Relative: 1 %
Eosinophils Absolute: 0.1 10*3/uL (ref 0.0–0.5)
Eosinophils Relative: 1 %
HCT: 30 % — ABNORMAL LOW (ref 39.0–52.0)
Hemoglobin: 10 g/dL — ABNORMAL LOW (ref 13.0–17.0)
Immature Granulocytes: 1 %
Lymphocytes Relative: 13 %
Lymphs Abs: 1.1 10*3/uL (ref 0.7–4.0)
MCH: 30 pg (ref 26.0–34.0)
MCHC: 33.3 g/dL (ref 30.0–36.0)
MCV: 90.1 fL (ref 80.0–100.0)
Monocytes Absolute: 0.7 10*3/uL (ref 0.1–1.0)
Monocytes Relative: 9 %
Neutro Abs: 6.4 10*3/uL (ref 1.7–7.7)
Neutrophils Relative %: 75 %
Platelets: 582 10*3/uL — ABNORMAL HIGH (ref 150–400)
RBC: 3.33 MIL/uL — ABNORMAL LOW (ref 4.22–5.81)
RDW: 13.4 % (ref 11.5–15.5)
WBC: 8.4 10*3/uL (ref 4.0–10.5)
nRBC: 0 % (ref 0.0–0.2)

## 2024-06-13 LAB — COMPREHENSIVE METABOLIC PANEL WITH GFR
ALT: 14 U/L (ref 0–44)
AST: 22 U/L (ref 15–41)
Albumin: 3 g/dL — ABNORMAL LOW (ref 3.5–5.0)
Alkaline Phosphatase: 74 U/L (ref 38–126)
Anion gap: 13 (ref 5–15)
BUN: 15 mg/dL (ref 8–23)
CO2: 23 mmol/L (ref 22–32)
Calcium: 9.2 mg/dL (ref 8.9–10.3)
Chloride: 103 mmol/L (ref 98–111)
Creatinine, Ser: 1.11 mg/dL (ref 0.61–1.24)
GFR, Estimated: >60 mL/min (ref >60)
Glucose, Bld: 155 mg/dL — ABNORMAL HIGH (ref 70–99)
Potassium: 3.5 mmol/L (ref 3.5–5.1)
Sodium: 139 mmol/L (ref 135–145)
Total Bilirubin: 0.7 mg/dL (ref 0.0–1.2)
Total Protein: 6.4 g/dL — ABNORMAL LOW (ref 6.5–8.1)

## 2024-06-13 LAB — URINALYSIS, W/ REFLEX TO CULTURE (INFECTION SUSPECTED)
Bilirubin Urine: NEGATIVE
Glucose, UA: NEGATIVE mg/dL
Ketones, ur: NEGATIVE mg/dL
Leukocytes,Ua: NEGATIVE
Nitrite: NEGATIVE
Protein, ur: NEGATIVE mg/dL
Specific Gravity, Urine: 1.013 (ref 1.005–1.030)
pH: 6 (ref 5.0–8.0)

## 2024-06-13 LAB — RESP PANEL BY RT-PCR (RSV, FLU A&B, COVID)  RVPGX2
Influenza A by PCR: NEGATIVE
Influenza B by PCR: NEGATIVE
Resp Syncytial Virus by PCR: NEGATIVE
SARS Coronavirus 2 by RT PCR: NEGATIVE

## 2024-06-13 LAB — I-STAT CG4 LACTIC ACID, ED
Lactic Acid, Venous: 2.8 mmol/L (ref 0.5–1.9)
Lactic Acid, Venous: 1.7 mmol/L (ref 0.5–1.9)

## 2024-06-13 LAB — PROTIME-INR
INR: 1.1 (ref 0.8–1.2)
Prothrombin Time: 14.6 s (ref 11.4–15.2)

## 2024-06-13 LAB — MAGNESIUM: Magnesium: 1.5 mg/dL — ABNORMAL LOW (ref 1.7–2.4)

## 2024-06-13 MED ORDER — SODIUM CHLORIDE 0.9 % IV SOLN
2.0000 g | Freq: Once | INTRAVENOUS | Status: AC
  Administered 2024-06-13: 2 g via INTRAVENOUS
  Filled 2024-06-13: qty 20

## 2024-06-13 MED ORDER — ONDANSETRON HCL 4 MG/2ML IJ SOLN: 4.0000 mg | Freq: Four times a day (QID) | INTRAMUSCULAR | Status: DC | PRN

## 2024-06-13 MED ORDER — ACETAMINOPHEN 325 MG PO TABS
650.0000 mg | ORAL_TABLET | Freq: Four times a day (QID) | ORAL | Status: AC | PRN
Start: 2024-06-13 — End: ?
  Administered 2024-06-14: 650 mg via ORAL
  Filled 2024-06-13 (×2): qty 2

## 2024-06-13 MED ORDER — ACETAMINOPHEN 325 MG PO TABS: 650.0000 mg | ORAL_TABLET | Freq: Once | ORAL | Status: DC

## 2024-06-13 MED ORDER — LACTATED RINGERS IV SOLN: INTRAVENOUS | Status: DC

## 2024-06-13 MED ORDER — ACETAMINOPHEN 500 MG PO TABS
1000.0000 mg | ORAL_TABLET | Freq: Once | ORAL | Status: AC
  Administered 2024-06-13: 1000 mg via ORAL
  Filled 2024-06-13: qty 2

## 2024-06-13 MED ORDER — MELATONIN 3 MG PO TABS
3.0000 mg | ORAL_TABLET | Freq: Every evening | ORAL | Status: DC | PRN
  Administered 2024-06-14: 3 mg via ORAL
  Filled 2024-06-13: qty 1

## 2024-06-13 MED ORDER — SODIUM CHLORIDE 0.9 % IV SOLN
500.0000 mg | Freq: Once | INTRAVENOUS | Status: AC
  Administered 2024-06-13: 500 mg via INTRAVENOUS
  Filled 2024-06-13: qty 5

## 2024-06-13 MED ORDER — ACETAMINOPHEN 650 MG RE SUPP: 650.0000 mg | Freq: Four times a day (QID) | RECTAL | Status: DC | PRN

## 2024-06-13 MED ORDER — INSULIN ASPART 100 UNIT/ML IJ SOLN
0.0000 [IU] | Freq: Three times a day (TID) | INTRAMUSCULAR | Status: DC
  Administered 2024-06-14: 1 [IU] via SUBCUTANEOUS
  Administered 2024-06-15: 2 [IU] via SUBCUTANEOUS
  Administered 2024-06-16 (×2): 1 [IU] via SUBCUTANEOUS

## 2024-06-13 MED ORDER — SODIUM CHLORIDE 0.9 % IV BOLUS (SEPSIS)
1000.0000 mL | Freq: Once | INTRAVENOUS | Status: AC
  Administered 2024-06-13: 1000 mL via INTRAVENOUS

## 2024-06-13 NOTE — ED Provider Notes (Addendum)
 [name] EMERGENCY DEPARTMENT AT [facility] Provider Note   CSN: [id number] Arrival date & time: 06/13/24  2024     Patient presents with: Altered Mental Status   [name] is a 89 y.o. male.   Patient here with altered mental status fever.  Patient states maybe some discomfort with urinating but he does not have any cough abdominal pain chest pain shortness of breath cough.  [name] like he has some changes mentation today.  Patient's very talkative on exam talking about his church and somewhat hard to redirect.  He had recent left hip surgery a couple weeks ago.  Is not having any major pain in the left hip.  Does not appear to be in any major infectious process per EMS there.  He has a history of asthma personality disorder, seizure disorder diabetes.  The history is provided by the patient.       Prior to Admission medications   Medication Sig Start Date End Date Taking? Authorizing Provider  amLODipine  (NORVASC ) 10 MG tablet Take 1 tablet (10 mg total) by mouth daily. 03/02/24   Yevette Hem, FNP  aspirin  81 MG chewable tablet Chew 1 tablet (81 mg total) by mouth 2 (two) times daily. 05/31/24   Bronson Canny, PA-C  atorvastatin  (LIPITOR) 20 MG tablet Take 1 tablet (20 mg total) by mouth daily. 03/02/24   Tommas Fragmin A, FNP  baclofen  (LIORESAL ) 10 MG tablet TAKE 1/2 TABLET (5 MG) BY MOUTH 3 TIMES DAILY 11/13/23   Tommas Fragmin A, FNP  diazepam  (VALIUM ) 5 MG tablet Take 1 tablet (5 mg total) by mouth 2 (two) times daily as needed. 03/02/24   Yevette Hem, FNP  diclofenac  Sodium (VOLTAREN ) 1 % GEL Apply 2 g topically 4 (four) times daily. 11/13/23   Yevette Hem, FNP  DULoxetine  (CYMBALTA ) 60 MG capsule Take 1 capsule (60 mg total) by mouth daily. 03/02/24   Tommas Fragmin A, FNP  finasteride  (PROSCAR ) 5 MG tablet Take 1 tablet (5 mg total) by mouth at bedtime. For prostate 03/02/24   Tommas Fragmin A, FNP  gabapentin  (NEURONTIN ) 300 MG capsule Take 1 capsule  (300 mg total) by mouth 2 (two) times daily. 03/02/24   Yevette Hem, FNP  insulin  glargine, 2 Unit Dial , (TOUJEO  MAX SOLOSTAR) 300 UNIT/ML Solostar Pen Inject 10 Units into the skin at bedtime. Patient taking differently: Inject 5-10 Units into the skin at bedtime as needed (High blood gloucose). 03/02/24   Tommas Fragmin A, FNP  latanoprost  (XALATAN ) 0.005 % ophthalmic solution Place 1 drop into both eyes at bedtime. 06/08/20   [provider]  Lidocaine HCl (PAIN RELIEF ROLL-ON EX) Apply 1 application  topically daily as needed (pain).    [provider]  losartan  (COZAAR ) 100 MG tablet Take 1 tablet (100 mg total) by mouth daily. 03/02/24   Tommas Fragmin A, FNP  metFORMIN  (GLUCOPHAGE -XR) 500 MG 24 hr tablet TAKE 1 TABLET BY MOUTH 3 TIMES DAILY WITH MEALS. 03/02/24   Hawks, Christy A, FNP  oxybutynin  (DITROPAN -XL) 5 MG 24 hr tablet Take 1 tablet (5 mg total) by mouth at bedtime. 03/02/24   Tommas Fragmin A, FNP  oxyCODONE  (OXY IR/ROXICODONE ) 5 MG immediate release tablet Take 1-2 tablets (5-10 mg total) by mouth every 4 (four) hours as needed for moderate pain (pain score 4-6) (pain score 4-6). 05/31/24   Bronson Canny, PA-C  Polyethyl Glycol-Propyl Glycol (SYSTANE) 0.4-0.3 % SOLN Place 1 drop into both eyes daily  as needed (Dry eye).    [provider]  tamsulosin  (FLOMAX ) 0.4 MG CAPS capsule TAKE 2 CAPSULES (0.8 MG TOTAL) BY MOUTH AT BEDTIME. FOR PROSTATE. 02/16/24   Tommas Fragmin A, FNP  torsemide  (DEMADEX ) 20 MG tablet Take 1 tablet (20 mg total) by mouth daily. Leg swelling, take 1 of these per day on top of your normal fluid pills medicines for 5 days 05/05/24   Dettinger, Lucio Sabin, MD  Vitamin D , Ergocalciferol , (DRISDOL ) 1.25 MG (50000 UNIT) CAPS capsule TAKE 1 CAPSULE BY MOUTH ON SUNDAY OF EACH WEEK AS DIRECTED 03/26/24   Tommas Fragmin A, FNP    Allergies: Diclofenac , Flexeril [cyclobenzaprine], and Guaifenesin er    Review of Systems  Updated Vital Signs BP (!)  157/66   Pulse 88   Temp 100 F (37.8 C) (Rectal)   Resp 13   SpO2 99%   Physical Exam Vitals and nursing note reviewed.  Constitutional:      General: He is not in acute distress.    Appearance: He is well-developed.  HENT:     Head: Normocephalic and atraumatic.     Mouth/Throat:     Mouth: Mucous membranes are moist.   Eyes:     Extraocular Movements: Extraocular movements intact.     Conjunctiva/sclera: Conjunctivae normal.     Pupils: Pupils are equal, round, and reactive to light.    Cardiovascular:     Rate and Rhythm: Normal rate and regular rhythm.     Pulses: Normal pulses.     Heart sounds: Normal heart sounds. No murmur heard. Pulmonary:     Effort: Pulmonary effort is normal. No respiratory distress.     Breath sounds: Normal breath sounds.  Abdominal:     Palpations: Abdomen is soft.     Tenderness: There is no abdominal tenderness.   Musculoskeletal:        General: No swelling.     Cervical back: Neck supple.   Skin:    General: Skin is warm and dry.     Capillary Refill: Capillary refill takes less than 2 seconds.     Comments: Surgical site in the left hip with a bandage over the top but there is no redness or tenderness on exam, surgical dressing looks clean and dry   Neurological:     General: No focal deficit present.     Mental Status: He is alert.     Comments: Patient is alert he is somewhat hypertalkative but his discussion revolves mostly around his church he is able to be redirected  Psychiatric:        Mood and Affect: Mood normal.     (all labs ordered are listed, but only abnormal results are displayed) Labs Reviewed  COMPREHENSIVE METABOLIC PANEL WITH GFR - Abnormal; Notable for the following components:      Result Value   Glucose, Bld 155 (*)    Total Protein 6.4 (*)    Albumin 3.0 (*)    All other components within normal limits  CBC WITH DIFFERENTIAL/PLATELET - Abnormal; Notable for the following components:   RBC 3.33  (*)    Hemoglobin 10.0 (*)    HCT 30.0 (*)    Platelets 582 (*)    All other components within normal limits  URINALYSIS, W/ REFLEX TO CULTURE (INFECTION SUSPECTED) - Abnormal; Notable for the following components:   Hgb urine dipstick MODERATE (*)    Bacteria, UA RARE (*)    All other components within normal limits  I-STAT CG4 LACTIC ACID, ED - Abnormal; Notable for the following components:   Lactic Acid, Venous 2.8 (*)    All other components within normal limits  RESP PANEL BY RT-PCR (RSV, FLU A&B, COVID)  RVPGX2  CULTURE, BLOOD (ROUTINE X 2)  CULTURE, BLOOD (ROUTINE X 2)  PROTIME-INR  CBC WITH DIFFERENTIAL/PLATELET  COMPREHENSIVE METABOLIC PANEL WITH GFR  MAGNESIUM   MAGNESIUM   I-STAT CG4 LACTIC ACID, ED    EKG: EKG Interpretation Date/Time:  Sunday June 13 2024 21:09:06 EDT Ventricular Rate:  88 PR Interval:  140 QRS Duration:  93 QT Interval:  364 QTC Calculation: 441 R Axis:   61  Text Interpretation: Sinus rhythm Confirmed by Lowery Rue [phone]) on 06/13/2024 9:12:00 PM  Radiology: Lenell Query Chest Port 1 View Result Date: 06/13/2024 CLINICAL DATA:  Sepsis, altered level of consciousness EXAM: PORTABLE CHEST 1 VIEW COMPARISON:  05/14/2023 FINDINGS: The heart size and mediastinal contours are within normal limits. Both lungs are clear. The visualized skeletal structures are unremarkable. IMPRESSION: No active disease. Electronically Signed   By: Bobbye Burrow M.D.   On: 06/13/2024 21:27     .Critical Care  Performed by: Lowery Rue, DO Authorized by: Lowery Rue, DO   Critical care provider statement:    Critical care time (minutes):  35   Critical care was time spent personally by me on the following activities:  Blood draw for specimens, development of treatment plan with patient or surrogate, discussions with primary provider, evaluation of patient's response to treatment, examination of patient, obtaining history from patient or surrogate, ordering and  performing treatments and interventions, ordering and review of laboratory studies, ordering and review of radiographic studies, pulse oximetry, re-evaluation of patient's condition and review of old charts   Care discussed with: admitting provider      Medications Ordered in the ED  lactated ringers  infusion ( Intravenous New Bag/Given 06/13/24 2143)  acetaminophen  (TYLENOL ) tablet 650 mg (has no administration in time range)    Or  acetaminophen  (TYLENOL ) suppository 650 mg (has no administration in time range)  melatonin tablet 3 mg (has no administration in time range)  ondansetron  (ZOFRAN ) injection 4 mg (has no administration in time range)  insulin  aspart (novoLOG ) injection 0-9 Units (has no administration in time range)  sodium chloride  0.9 % bolus 1,000 mL (1,000 mLs Intravenous New Bag/Given 06/13/24 2114)  cefTRIAXone (ROCEPHIN) 2 g in sodium chloride  0.9 % 100 mL IVPB (0 g Intravenous Stopped 06/13/24 2211)  azithromycin (ZITHROMAX) 500 mg in sodium chloride  0.9 % 250 mL IVPB (500 mg Intravenous New Bag/Given 06/13/24 2215)  acetaminophen  (TYLENOL ) tablet 1,000 mg (1,000 mg Oral Given 06/13/24 2139)                                    Medical Decision Making Amount and/or Complexity of Data Reviewed Labs: ordered. Radiology: ordered.  Risk OTC drugs. Prescription drug management. Decision regarding hospitalization.   Mitesh Nasby is here with altered mental status found to be febrile.  Otherwise unremarkable vitals.  He is a little bit hyper on exam talking a lot and somewhat hard to redirect.  But [name] like he is talking about mostly appropriate things.  He just had hip surgery few weeks ago.  Surgical site appears well.  Maybe some pain with urination.  Does not Solik is had a cough or abdominal pain.  Overall we will pursue sepsis workup could be viral  process.  [name] less likely to be surgical complication as the surgical site appears well but something we might need to  think about.  Overall we will start blood cultures broad-spectrum IV antibiotics urinalysis basic labs will give IV fluids Tylenol  and anticipate admission.  Per my review interpretation of labs lactic acid is 2.8.  Otherwise urinalysis negative for infection.  No significant leukocytosis anemia or electrolyte abnormality otherwise.  Chest x-ray negative for infection.  I stated before surgical site appears clean dry and intact.  I peeled back surgical dressing in [place] were in place and there is no signs of any wound dehiscence or purulent drainage or erythema.  This could be a viral process.  Have no concern for meningitis.  But given his recent surgery and his little bit of confusion I think he would benefit from observation stay for hydration make sure blood cultures come back negative.  Viral panel still pending as well.  Will talk with hospitalist about admission.  Hospitalist will admit for further care.  This chart was dictated using voice recognition software.  Despite best efforts to proofread,  errors can occur which can change the documentation meaning.      Final diagnoses:  Fever, unspecified fever cause  SIRS (systemic inflammatory response syndrome) [facility])    ED Discharge Orders     None          Lowery Rue, DO 06/13/24 2326    Lowery Rue, DO 06/13/24 2332
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
## 2024-06-13 NOTE — Sepsis Progress Note (Signed)
 Elink following code sepsis

## 2024-06-13 NOTE — ED Triage Notes (Signed)
 Patient coming from [facility] with altered mental status. [name] unable to history of patient to EMS other than patient was presenting with altered mental status. Recent left hip replacement. Having pain on and off on that left hip. EMS VS 167 CBG 101.56F temp 98% RA 100 HR

## 2024-06-14 ENCOUNTER — Encounter: Admitting: Physician Assistant

## 2024-06-14 ENCOUNTER — Encounter (HOSPITAL_COMMUNITY): Payer: Self-pay | Admitting: Internal Medicine

## 2024-06-14 ENCOUNTER — Observation Stay (HOSPITAL_COMMUNITY)

## 2024-06-14 DIAGNOSIS — D649 Anemia, unspecified: Secondary | ICD-10-CM | POA: Diagnosis present

## 2024-06-14 DIAGNOSIS — R509 Fever, unspecified: Secondary | ICD-10-CM | POA: Diagnosis not present

## 2024-06-14 DIAGNOSIS — G9340 Encephalopathy, unspecified: Secondary | ICD-10-CM | POA: Diagnosis not present

## 2024-06-14 DIAGNOSIS — F411 Generalized anxiety disorder: Secondary | ICD-10-CM | POA: Diagnosis present

## 2024-06-14 DIAGNOSIS — R32 Unspecified urinary incontinence: Secondary | ICD-10-CM | POA: Diagnosis present

## 2024-06-14 DIAGNOSIS — Z96642 Presence of left artificial hip joint: Secondary | ICD-10-CM | POA: Diagnosis not present

## 2024-06-14 DIAGNOSIS — Z8679 Personal history of other diseases of the circulatory system: Secondary | ICD-10-CM

## 2024-06-14 DIAGNOSIS — Z471 Aftercare following joint replacement surgery: Secondary | ICD-10-CM | POA: Diagnosis not present

## 2024-06-14 DIAGNOSIS — E872 Acidosis, unspecified: Secondary | ICD-10-CM | POA: Diagnosis present

## 2024-06-14 LAB — BLOOD CULTURE ID PANEL (REFLEXED) - BCID2

## 2024-06-14 LAB — RESPIRATORY PANEL BY PCR

## 2024-06-14 LAB — CBC WITH DIFFERENTIAL/PLATELET
Abs Immature Granulocytes: 0.05 10*3/uL (ref 0.00–0.07)
Basophils Absolute: 0 10*3/uL (ref 0.0–0.1)
Basophils Relative: 0 %
Eosinophils Absolute: 0.1 10*3/uL (ref 0.0–0.5)
Eosinophils Relative: 1 %
HCT: 28.6 % — ABNORMAL LOW (ref 39.0–52.0)
Hemoglobin: 9.2 g/dL — ABNORMAL LOW (ref 13.0–17.0)
Immature Granulocytes: 1 %
Lymphocytes Relative: 10 %
Lymphs Abs: 0.8 10*3/uL (ref 0.7–4.0)
MCH: 30.1 pg (ref 26.0–34.0)
MCHC: 32.2 g/dL (ref 30.0–36.0)
MCV: 93.5 fL (ref 80.0–100.0)
Monocytes Absolute: 0.6 10*3/uL (ref 0.1–1.0)
Monocytes Relative: 8 %
Neutro Abs: 6.1 10*3/uL (ref 1.7–7.7)
Neutrophils Relative %: 80 %
Platelets: 486 10*3/uL — ABNORMAL HIGH (ref 150–400)
RBC: 3.06 MIL/uL — ABNORMAL LOW (ref 4.22–5.81)
RDW: 13.6 % (ref 11.5–15.5)
WBC: 7.7 10*3/uL (ref 4.0–10.5)
nRBC: 0 % (ref 0.0–0.2)

## 2024-06-14 LAB — IRON AND TIBC
Iron: 32 ug/dL — ABNORMAL LOW (ref 45–182)
Saturation Ratios: 14 % — ABNORMAL LOW (ref 17.9–39.5)
TIBC: 237 ug/dL — ABNORMAL LOW (ref 250–450)
UIBC: 205 ug/dL

## 2024-06-14 LAB — RAPID URINE DRUG SCREEN, HOSP PERFORMED
Amphetamines: NOT DETECTED
Barbiturates: NOT DETECTED
Benzodiazepines: POSITIVE — AB
Cocaine: NOT DETECTED
Opiates: NOT DETECTED
Tetrahydrocannabinol: NOT DETECTED

## 2024-06-14 LAB — COMPREHENSIVE METABOLIC PANEL WITH GFR
ALT: 11 U/L (ref 0–44)
AST: 17 U/L (ref 15–41)
Albumin: 2.6 g/dL — ABNORMAL LOW (ref 3.5–5.0)
Alkaline Phosphatase: 57 U/L (ref 38–126)
Anion gap: 11 (ref 5–15)
BUN: 9 mg/dL (ref 8–23)
CO2: 21 mmol/L — ABNORMAL LOW (ref 22–32)
Calcium: 8.1 mg/dL — ABNORMAL LOW (ref 8.9–10.3)
Chloride: 107 mmol/L (ref 98–111)
Creatinine, Ser: 0.73 mg/dL (ref 0.61–1.24)
GFR, Estimated: >60 mL/min (ref >60)
Glucose, Bld: 110 mg/dL — ABNORMAL HIGH (ref 70–99)
Potassium: 3 mmol/L — ABNORMAL LOW (ref 3.5–5.1)
Sodium: 139 mmol/L (ref 135–145)
Total Bilirubin: 0.5 mg/dL (ref 0.0–1.2)
Total Protein: 5.6 g/dL — ABNORMAL LOW (ref 6.5–8.1)

## 2024-06-14 LAB — CBG MONITORING, ED: Glucose-Capillary: 98 mg/dL (ref 70–99)

## 2024-06-14 LAB — VITAMIN B12: Vitamin B-12: 178 pg/mL — ABNORMAL LOW (ref 180–914)

## 2024-06-14 LAB — PROCALCITONIN: Procalcitonin: <0.1 ng/mL

## 2024-06-14 LAB — BLOOD GAS, VENOUS
Acid-base deficit: 1.1 mmol/L (ref 0.0–2.0)
Bicarbonate: 22.4 mmol/L (ref 20.0–28.0)
Drawn by: 7515
O2 Saturation: 95 %
Patient temperature: 36.8
pCO2, Ven: 33 mmHg — ABNORMAL LOW (ref 44–60)
pH, Ven: 7.44 — ABNORMAL HIGH (ref 7.25–7.43)
pO2, Ven: 65 mmHg — ABNORMAL HIGH (ref 32–45)

## 2024-06-14 LAB — HEMOGLOBIN AND HEMATOCRIT, BLOOD
HCT: 30.1 % — ABNORMAL LOW (ref 39.0–52.0)
Hemoglobin: 10.1 g/dL — ABNORMAL LOW (ref 13.0–17.0)

## 2024-06-14 LAB — MAGNESIUM
Magnesium: 8.9 mg/dL (ref 1.7–2.4)
Magnesium: 2.1 mg/dL (ref 1.7–2.4)

## 2024-06-14 LAB — FOLATE: Folate: 8.9 ng/mL (ref >5.9)

## 2024-06-14 LAB — GLUCOSE, CAPILLARY
Glucose-Capillary: 95 mg/dL (ref 70–99)
Glucose-Capillary: 133 mg/dL — ABNORMAL HIGH (ref 70–99)

## 2024-06-14 LAB — D-DIMER, QUANTITATIVE: D-Dimer, Quant: 7.99 ug{FEU}/mL — ABNORMAL HIGH (ref 0.00–0.50)

## 2024-06-14 LAB — SALICYLATE LEVEL: Salicylate Lvl: <7 mg/dL — ABNORMAL LOW (ref 7.0–30.0)

## 2024-06-14 LAB — TSH: TSH: 2.16 u[IU]/mL (ref 0.350–4.500)

## 2024-06-14 LAB — C-REACTIVE PROTEIN: CRP: 2 mg/dL — ABNORMAL HIGH (ref <1.0)

## 2024-06-14 LAB — FERRITIN: Ferritin: 186 ng/mL (ref 24–336)

## 2024-06-14 LAB — CK: Total CK: 95 U/L (ref 49–397)

## 2024-06-14 MED ORDER — TAMSULOSIN HCL 0.4 MG PO CAPS
0.8000 mg | ORAL_CAPSULE | Freq: Every day | ORAL | Status: DC
  Administered 2024-06-14 – 2024-06-15 (×2): 0.8 mg via ORAL
  Filled 2024-06-14 (×2): qty 2

## 2024-06-14 MED ORDER — NALOXONE HCL 0.4 MG/ML IJ SOLN: 0.4000 mg | INTRAMUSCULAR | Status: DC | PRN

## 2024-06-14 MED ORDER — HALOPERIDOL LACTATE 5 MG/ML IJ SOLN: 5.0000 mg | Freq: Once | INTRAMUSCULAR | Status: AC

## 2024-06-14 MED ORDER — VITAMIN B-12 1000 MCG PO TABS
1000.0000 ug | ORAL_TABLET | Freq: Every day | ORAL | Status: DC
  Administered 2024-06-14 – 2024-06-16 (×3): 1000 ug via ORAL
  Filled 2024-06-14 (×3): qty 1

## 2024-06-14 MED ORDER — LATANOPROST 0.005 % OP SOLN
1.0000 [drp] | Freq: Every day | OPHTHALMIC | Status: DC
  Administered 2024-06-14: 1 [drp] via OPHTHALMIC
  Filled 2024-06-14: qty 2.5

## 2024-06-14 MED ORDER — LORAZEPAM 2 MG/ML IJ SOLN: 1.0000 mg | Freq: Once | INTRAMUSCULAR | Status: DC | PRN

## 2024-06-14 MED ORDER — ENOXAPARIN SODIUM 40 MG/0.4ML IJ SOSY
40.0000 mg | PREFILLED_SYRINGE | INTRAMUSCULAR | Status: DC
  Administered 2024-06-14 – 2024-06-15 (×2): 40 mg via SUBCUTANEOUS
  Filled 2024-06-14 (×2): qty 0.4

## 2024-06-14 MED ORDER — LORAZEPAM 2 MG/ML IJ SOLN
1.0000 mg | Freq: Two times a day (BID) | INTRAMUSCULAR | Status: DC | PRN
  Administered 2024-06-16: 1 mg via INTRAVENOUS
  Filled 2024-06-14: qty 1

## 2024-06-14 MED ORDER — FINASTERIDE 5 MG PO TABS
5.0000 mg | ORAL_TABLET | Freq: Every day | ORAL | Status: DC
  Administered 2024-06-14 – 2024-06-15 (×2): 5 mg via ORAL
  Filled 2024-06-14 (×2): qty 1

## 2024-06-14 MED ORDER — FENTANYL CITRATE PF 50 MCG/ML IJ SOSY: 25.0000 ug | PREFILLED_SYRINGE | INTRAMUSCULAR | Status: DC | PRN

## 2024-06-14 MED ORDER — ASPIRIN 81 MG PO CHEW
81.0000 mg | CHEWABLE_TABLET | Freq: Two times a day (BID) | ORAL | Status: DC
  Administered 2024-06-14: 81 mg via ORAL
  Filled 2024-06-14: qty 1

## 2024-06-14 MED ORDER — MAGNESIUM SULFATE 2 GM/50ML IV SOLN
2.0000 g | Freq: Once | INTRAVENOUS | Status: AC
  Administered 2024-06-14: 2 g via INTRAVENOUS
  Filled 2024-06-14: qty 50

## 2024-06-14 MED ORDER — HALOPERIDOL LACTATE 5 MG/ML IJ SOLN
5.0000 mg | Freq: Four times a day (QID) | INTRAMUSCULAR | Status: DC | PRN
  Administered 2024-06-14: 5 mg via INTRAVENOUS

## 2024-06-14 MED ORDER — HALOPERIDOL LACTATE 5 MG/ML IJ SOLN
INTRAMUSCULAR | Status: AC
  Administered 2024-06-14: 5 mg via INTRAVENOUS
  Filled 2024-06-14: qty 1

## 2024-06-14 MED ORDER — STERILE WATER FOR INJECTION IJ SOLN
INTRAMUSCULAR | Status: AC
  Administered 2024-06-14: 2 mL
  Filled 2024-06-14: qty 10

## 2024-06-14 MED ORDER — DULOXETINE HCL 60 MG PO CPEP
60.0000 mg | ORAL_CAPSULE | Freq: Every day | ORAL | Status: DC
  Administered 2024-06-14 – 2024-06-16 (×3): 60 mg via ORAL
  Filled 2024-06-14 (×3): qty 1

## 2024-06-14 MED ORDER — ATORVASTATIN CALCIUM 10 MG PO TABS
20.0000 mg | ORAL_TABLET | Freq: Every day | ORAL | Status: DC
  Administered 2024-06-14 – 2024-06-16 (×3): 20 mg via ORAL
  Filled 2024-06-14 (×3): qty 2

## 2024-06-14 MED ORDER — ZIPRASIDONE MESYLATE 20 MG IM SOLR
20.0000 mg | Freq: Once | INTRAMUSCULAR | Status: AC | PRN
Start: 2024-06-14 — End: 2024-06-14
  Administered 2024-06-14: 20 mg via INTRAMUSCULAR
  Filled 2024-06-14: qty 20

## 2024-06-14 NOTE — ED Notes (Signed)
 Pt provided with peri care for incontinence of urine.  Pt tolerated well.

## 2024-06-14 NOTE — ED Notes (Signed)
 Due to patient's cooperation, restraints were untied from the bed frame.

## 2024-06-14 NOTE — ED Notes (Addendum)
 This nurse heard patient calling for help. Upon entering the room this nurse patient what did they need help help with. Patient replied that God was telling him not to tell us  anything. This nurse called attending Howerter DO to report of patient's change of mentation. Patient started to become more agitated and violent resulting in him ripping out his IVs and vital sign equipment off the monitor. Attempts to redirect patient were unsuccessful. Attending Howerter DO made aware. Help was called into the room and Haldolol and Geodon were administered. Soft restraints were applied. Patient's airway is clear, pulses are strong in more wrists, and circulation is less than 3 second on capillary refill. Through incident patient was of religious delusions.

## 2024-06-14 NOTE — Progress Notes (Addendum)
 PROGRESS NOTE    [name]  ZOX:[id number] DOB: 1933/05/03 DOA: 06/13/2024 PCP: Yevette Hem, FNP  Outpatient Specialists: ortho    Brief Narrative:   From admission h and p  [name] is a 89 y.o. male with medical history significant for type 2 diabetes mellitus, left total hip arthroplasty on 05/28/2024, GAD, BPH, essential pretension, hyperlipidemia, who is admitted to [facility] on 06/13/2024 with acute encephalopathy after presenting from SNF to [facility] ED for evaluation of altered mental status.    In the setting of the patient's altered mental status, the following history was provided by the SNF staff, my discussions with the EDP, and via chart review.   In the setting of osteoarthritis of the left hip, he recently underwent elective left total hip arthroplasty with Dr. Lucienne Ryder of [facility] on 05/28/2024.  On 05/31/2024, he was discharged to SNF for temporary rehab.  Over the last 1 to 2 days, SNF staff has noted the patient to be confused relative to his baseline mental status, ultimately resulting in his presentation to [facility] emergency department this evening.  Patient has reported some ongoing left hip discomfort, but was otherwise without acute complaint leading up to progressive agitation in the ED. EDP also conveys the patient had denied any acute respiratory symptoms.    Following discharge from [facility] on 05/31/24 to SNF, the patient has been prn oxycodone  for residual left hip discomfort.  His outpatient medications are also notable for scheduled baclofen  on a 3 times daily basis, as needed Valium  for history of generalized anxiety disorder, scheduled gabapentin .  He also has a history of BPH as well as urinary incontinence and is on scheduled oxybutynin  for the latter.   Per chart review, his baseline hemoglobin ranges 13.5-15.  His most recent preoperative hemoglobin was found to be 13.9 on 05/14/2024, while his most recent prior hemoglobin,  which occurred on postop day 2 status post left total hip arthroplasty, was found to be 10 when checked on 05/30/2024.  Following his left total hip arthroplasty on 05/28/2024, he has been on aspirin  81 mg p.o. twice daily.    Per chart review, most recent echocardiogram occurred in February 2013, at which time 2D echo showed LVEF 60 to 65% as well as no evidence of focal wall motion abnormalities.   Assessment & Plan:   Principal Problem:   Acute encephalopathy Active Problems:   DM2 (diabetes mellitus, type 2) (HCC)   HLD (hyperlipidemia)   BPH (benign prostatic hyperplasia)   Hx of total hip arthroplasty   Fever   Lactic acidosis   Hypomagnesemia   Normocytic anemia   GAD (generalized anxiety disorder)   History of essential hypertension   Urinary incontinence  # Encephalopathy Appears mostly resolved today, may have a component of delirium - monitor - holding home gabapentin , oxycodone   # Fever No obvious localizing symptoms. Recent hip replacement on the left but no significant pain there, does have some induration lateral and dependent areas. No respiratory symptoms. Alert today with no headache or nuchal stiffness, doubt meningitis. Covid/flu/rsv neg and denies respiratory symptoms. CXR clear. No uti sympotms, urinalysis not strongly suggestive of infection - monitor blood cultures, ngtd - add on urine culture - check rvp - will check CT of left hip to eval for infectious complication - f/u dimer, venous thrombosis can sometimes cause fever and is at risk for that  # Recent total left hip arthroplasty Occurred 5/30 - was due for ortho f/u last  week, not clear that happened, will plan on touching base w/ ortho after CT - PT/OT consult, likely needs to return to SNF  # B12 deficiency - start supplement  # HTN Bp appropriate - home amlodipine , losartanon hold - continue home statin  # T2DM - SSI  # Chronic pain - home duloxetine   # BPH # Acute urinary  retention Retained 400 today, s/p I/o cath x1 - continue home finasteride , flomax  - monitor for retention, I/o cath prn for now - hold home oxybutynin    DVT prophylaxis: lovenox  Code Status: full Family Communication: none at bedside, no answer when daughter telephoned today  Level of care: Telemetry Medical Status is: Observation     Consultants:  none  Procedures: none  Antimicrobials:  Ceftriaxone in the ER    Subjective: Reports feeling fine  Objective: Vitals:   06/14/24 0830 06/14/24 1114 06/14/24 1117 06/14/24 1612  BP: (!) 142/64 (!) 142/62  (!) 134/50  Pulse: 67 79  75  Resp: 15 19  19   Temp:  97.8 F (36.6 C)  98.4 F (36.9 C)  TempSrc:  Oral    SpO2: 100% 96%  100%  Weight:   89.3 kg   Height:   5' 10 (1.778 m)     Intake/Output Summary (Last 24 hours) at 06/14/2024 1620 Last data filed at 06/14/2024 1221 Gross per 24 hour  Intake 400 ml  Output 800 ml  Net -400 ml   Filed Weights   06/14/24 1117  Weight: 89.3 kg    Examination:  General exam: Appears calm and comfortable  Respiratory system: Clear to auscultation. Respiratory effort normal. Cardiovascular system: S1 & S2 heard, RRR.   Gastrointestinal system: Abdomen is nondistended, soft and nontender.   Central nervous system: Alert and oriented. No focal neurological deficits. Extremities: Symmetric 5 x 5 power. Skin: bandage left upper anterior hip, some induration lateral and inferior to that, not tender Psychiatry: Judgement and insight appear normal. Mood & affect appropriate.     Data Reviewed: I have personally reviewed following labs and imaging studies  CBC: Recent Labs  Lab 06/13/24 2104 06/14/24 0836 06/14/24 1215  WBC 8.4 7.7  --   NEUTROABS 6.4 6.1  --   HGB 10.0* 9.2* 10.1*  HCT 30.0* 28.6* 30.1*  MCV 90.1 93.5  --   PLT 582* 486*  --    Basic Metabolic Panel: Recent Labs  Lab 06/13/24 2104 06/13/24 2112 06/14/24 0836 06/14/24 1215  NA 139  --  139   --   K 3.5  --  3.0*  --   CL 103  --  107  --   CO2 23  --  21*  --   GLUCOSE 155*  --  110*  --   BUN 15  --  9  --   CREATININE 1.11  --  0.73  --   CALCIUM  9.2  --  8.1*  --   MG  --  1.5* 8.9* 2.1   GFR: Estimated Creatinine Clearance: 70.4 mL/min (by C-G formula based on SCr of 0.73 mg/dL). Liver Function Tests: Recent Labs  Lab 06/13/24 2104 06/14/24 0836  AST 22 17  ALT 14 11  ALKPHOS 74 57  BILITOT 0.7 0.5  PROT 6.4* 5.6*  ALBUMIN 3.0* 2.6*   No results for input(s): LIPASE, AMYLASE in the last 168 hours. No results for input(s): AMMONIA in the last 168 hours. Coagulation Profile: Recent Labs  Lab 06/13/24 2104  INR 1.1   Cardiac  Enzymes: Recent Labs  Lab 06/14/24 0836  CKTOTAL 95   BNP (last 3 results) No results for input(s): PROBNP in the last 8760 hours. HbA1C: No results for input(s): HGBA1C in the last 72 hours. CBG: Recent Labs  Lab 06/14/24 0736 06/14/24 1123 06/14/24 1612  GLUCAP 98 95 133*   Lipid Profile: No results for input(s): CHOL, HDL, LDLCALC, TRIG, CHOLHDL, LDLDIRECT in the last 72 hours. Thyroid  Function Tests: Recent Labs    06/13/24 2319  TSH 2.160   Anemia Panel: Recent Labs    06/13/24 2319 06/14/24 0836 06/14/24 1215  VITAMINB12  --   --  178*  FOLATE 8.9  --   --   FERRITIN  --  186  --   TIBC  --  237*  --   IRON  --  32*  --    Urine analysis:    Component Value Date/Time   COLORURINE YELLOW 06/13/2024 2155   APPEARANCEUR CLEAR 06/13/2024 2155   APPEARANCEUR Clear 05/13/2022 1102   LABSPEC 1.013 06/13/2024 2155   PHURINE 6.0 06/13/2024 2155   GLUCOSEU NEGATIVE 06/13/2024 2155   HGBUR MODERATE (A) 06/13/2024 2155   BILIRUBINUR NEGATIVE 06/13/2024 2155   BILIRUBINUR Negative 05/13/2022 1102   KETONESUR NEGATIVE 06/13/2024 2155   PROTEINUR NEGATIVE 06/13/2024 2155   UROBILINOGEN 1.0 02/26/2012 2146   NITRITE NEGATIVE 06/13/2024 2155   LEUKOCYTESUR NEGATIVE 06/13/2024 2155    Sepsis Labs: @LABRCNTIP (procalcitonin:4,lacticidven:4)  ) Recent Results (from the past 240 hours)  Blood Culture (routine x 2)     Status: None (Preliminary result)   Collection Time: 06/13/24  9:05 PM   Specimen: BLOOD  Result Value Ref Range Status   Specimen Description BLOOD BLOOD RIGHT ARM  Final   Special Requests   Final    BOTTLES DRAWN AEROBIC AND ANAEROBIC Blood Culture adequate volume   Culture   Final    NO GROWTH < 12 HOURS Performed at [facility], [address] [place], [place] [address]    Report Status PENDING  Incomplete  Blood Culture (routine x 2)     Status: None (Preliminary result)   Collection Time: 06/13/24  9:05 PM   Specimen: BLOOD  Result Value Ref Range Status   Specimen Description BLOOD BLOOD LEFT ARM  Final   Special Requests   Final    BOTTLES DRAWN AEROBIC AND ANAEROBIC Blood Culture adequate volume   Culture   Final    NO GROWTH < 12 HOURS Performed at [facility], [address], [place], [place] [address]    Report Status PENDING  Incomplete  Resp panel by RT-PCR (RSV, Flu A&B, Covid) Anterior Nasal Swab     Status: None   Collection Time: 06/13/24  9:55 PM   Specimen: Anterior Nasal Swab  Result Value Ref Range Status   SARS Coronavirus 2 by RT PCR NEGATIVE NEGATIVE Final   Influenza A by PCR NEGATIVE NEGATIVE Final   Influenza B by PCR NEGATIVE NEGATIVE Final    Comment: (NOTE) The Xpert Xpress SARS-CoV-2/FLU/RSV plus assay is intended as an aid in the diagnosis of influenza from Nasopharyngeal swab specimens and should not be used as a sole basis for treatment. Nasal washings and aspirates are unacceptable for Xpert Xpress SARS-CoV-2/FLU/RSV testing.  Fact Sheet for Patients: BloggerCourse.com  Fact Sheet for Healthcare Providers: SeriousBroker.it  This test is not yet approved or cleared by the [place]  FDA and has been authorized for detection and/or  diagnosis of SARS-CoV-2 by  FDA under an Emergency Use Authorization (EUA). This EUA will remain in effect (meaning this test can be used) for the duration of the COVID-19 declaration under Section 564(b)(1) of the Act, 21 U.S.C. section 360bbb-3(b)(1), unless the authorization is terminated or revoked.     Resp Syncytial Virus by PCR NEGATIVE NEGATIVE Final    Comment: (NOTE) Fact Sheet for Patients: BloggerCourse.com  Fact Sheet for Healthcare Providers: SeriousBroker.it  This test is not yet approved or cleared by the [place]  FDA and has been authorized for detection and/or diagnosis of SARS-CoV-2 by FDA under an Emergency Use Authorization (EUA). This EUA will remain in effect (meaning this test can be used) for the duration of the COVID-19 declaration under Section 564(b)(1) of the Act, 21 U.S.C. section 360bbb-3(b)(1), unless the authorization is terminated or revoked.  Performed at [facility], [address] [place], [place] [address]          Radiology Studies: DG Chest Port 1 View Result Date: 06/13/2024 CLINICAL DATA:  Sepsis, altered level of consciousness EXAM: PORTABLE CHEST 1 VIEW COMPARISON:  05/14/2023 FINDINGS: The heart size and mediastinal contours are within normal limits. Both lungs are clear. The visualized skeletal structures are unremarkable. IMPRESSION: No active disease. Electronically Signed   By: Bobbye Burrow M.D.   On: 06/13/2024 21:27        Scheduled Meds:  aspirin   81 mg Oral BID   atorvastatin   20 mg Oral Daily   finasteride   5 mg Oral QHS   insulin  aspart  0-9 Units Subcutaneous TID WC   tamsulosin   0.8 mg Oral QHS   Continuous Infusions:   LOS: 0 days     Raymonde Calico, MD [facility]   If 7PM-7AM, please contact night-coverage [email or web address] Password [email or web address] 06/14/2024, 4:20 PM

## 2024-06-14 NOTE — Progress Notes (Signed)
 PHARMACY - PHYSICIAN COMMUNICATION CRITICAL VALUE ALERT - BLOOD CULTURE IDENTIFICATION (BCID)  [name] is an 89 y.o. male who presented to [facility] on 06/13/2024 with a chief complaint of Encephalopathy that is now resolved today.  Assessment:  1 anaerobic bottle + for staph species.   Name of physician (or Provider) Contacted: Melford Spotted  Current antibiotics: none  Changes to prescribed antibiotics recommended:  Recommendations accepted by provider. Likely contaminant, patient is improving off antibiotics per MD.   Results for orders placed or performed during the hospital encounter of 06/13/24  Blood Culture ID Panel (Reflexed) (Collected: 06/13/2024  9:05 PM)  Result Value Ref Range   Enterococcus faecalis NOT DETECTED NOT DETECTED   Enterococcus Faecium NOT DETECTED NOT DETECTED   Listeria monocytogenes NOT DETECTED NOT DETECTED   Staphylococcus species DETECTED (A) NOT DETECTED   Staphylococcus aureus (BCID) NOT DETECTED NOT DETECTED   Staphylococcus epidermidis NOT DETECTED NOT DETECTED   Staphylococcus lugdunensis NOT DETECTED NOT DETECTED   Streptococcus species NOT DETECTED NOT DETECTED   Streptococcus agalactiae NOT DETECTED NOT DETECTED   Streptococcus pneumoniae NOT DETECTED NOT DETECTED   Streptococcus pyogenes NOT DETECTED NOT DETECTED   A.calcoaceticus-baumannii NOT DETECTED NOT DETECTED   Bacteroides fragilis NOT DETECTED NOT DETECTED   Enterobacterales NOT DETECTED NOT DETECTED   Enterobacter cloacae complex NOT DETECTED NOT DETECTED   Escherichia coli NOT DETECTED NOT DETECTED   Klebsiella aerogenes NOT DETECTED NOT DETECTED   Klebsiella oxytoca NOT DETECTED NOT DETECTED   Klebsiella pneumoniae NOT DETECTED NOT DETECTED   Proteus species NOT DETECTED NOT DETECTED   Salmonella species NOT DETECTED NOT DETECTED   Serratia marcescens NOT DETECTED NOT DETECTED   Haemophilus influenzae NOT DETECTED NOT DETECTED   Neisseria meningitidis NOT DETECTED  NOT DETECTED   Pseudomonas aeruginosa NOT DETECTED NOT DETECTED   Stenotrophomonas maltophilia NOT DETECTED NOT DETECTED   Candida albicans NOT DETECTED NOT DETECTED   Candida auris NOT DETECTED NOT DETECTED   Candida glabrata NOT DETECTED NOT DETECTED   Candida krusei NOT DETECTED NOT DETECTED   Candida parapsilosis NOT DETECTED NOT DETECTED   Candida tropicalis NOT DETECTED NOT DETECTED   Cryptococcus neoformans/gattii NOT DETECTED NOT DETECTED    [name] 06/14/2024  8:16 PM

## 2024-06-14 NOTE — Progress Notes (Signed)
 TRH Update:  I was notified by RN that this patient, who is being admitted for acute encephalopathy, is agitated, pulling at leads, acting aggressively toward's staff, with these behaviors refractory to attempts at verbal redirection.  In the setting of associated interference with ongoing medical treatment posing potential harm to themself and staff, I have placed orders for Haldol 5 mg iv x 1 now, geodon 20 mg IM x 1 dose prn for refractory agitation, and soft bilateral wrist restraints.  Work-up for presentation acute encephalopathy is ongoing.    Camelia Cavalier, DO Hospitalist

## 2024-06-14 NOTE — Plan of Care (Signed)
  Problem: Education: Goal: Ability to describe self-care measures that may prevent or decrease complications (Diabetes Survival Skills Education) will improve Outcome: Progressing Goal: Individualized Educational Video(s) Outcome: Progressing   Problem: Coping: Goal: Ability to adjust to condition or change in health will improve Outcome: Progressing   Problem: Fluid Volume: Goal: Ability to maintain a balanced intake and output will improve Outcome: Progressing   Problem: Health Behavior/Discharge Planning: Goal: Ability to identify and utilize available resources and services will improve Outcome: Progressing Goal: Ability to manage health-related needs will improve Outcome: Progressing   Problem: Metabolic: Goal: Ability to maintain appropriate glucose levels will improve Outcome: Progressing   Problem: Nutritional: Goal: Maintenance of adequate nutrition will improve Outcome: Progressing Goal: Progress toward achieving an optimal weight will improve Outcome: Progressing   Problem: Skin Integrity: Goal: Risk for impaired skin integrity will decrease Outcome: Progressing   Problem: Tissue Perfusion: Goal: Adequacy of tissue perfusion will improve Outcome: Progressing   Problem: Safety: Goal: Non-violent Restraint(s) Outcome: Progressing

## 2024-06-14 NOTE — ED Notes (Signed)
 This nurse was able to get in contact with other contact listed in patient's chart. This nurse updated regarding patient's episode of confusion and violence toward staff. This nurse made contact aware that patient has returned to his baseline and is now out of the soft restraints.

## 2024-06-15 DIAGNOSIS — G9341 Metabolic encephalopathy: Principal | ICD-10-CM

## 2024-06-15 DIAGNOSIS — I10 Essential (primary) hypertension: Secondary | ICD-10-CM

## 2024-06-15 DIAGNOSIS — B348 Other viral infections of unspecified site: Secondary | ICD-10-CM

## 2024-06-15 DIAGNOSIS — E119 Type 2 diabetes mellitus without complications: Secondary | ICD-10-CM | POA: Diagnosis not present

## 2024-06-15 DIAGNOSIS — Z794 Long term (current) use of insulin: Secondary | ICD-10-CM | POA: Diagnosis not present

## 2024-06-15 DIAGNOSIS — R509 Fever, unspecified: Secondary | ICD-10-CM | POA: Diagnosis not present

## 2024-06-15 LAB — GLUCOSE, CAPILLARY
Glucose-Capillary: 114 mg/dL — ABNORMAL HIGH (ref 70–99)
Glucose-Capillary: 157 mg/dL — ABNORMAL HIGH (ref 70–99)
Glucose-Capillary: 117 mg/dL — ABNORMAL HIGH (ref 70–99)
Glucose-Capillary: 155 mg/dL — ABNORMAL HIGH (ref 70–99)

## 2024-06-15 MED ORDER — POTASSIUM CHLORIDE CRYS ER 20 MEQ PO TBCR
40.0000 meq | EXTENDED_RELEASE_TABLET | Freq: Once | ORAL | Status: AC
  Administered 2024-06-15: 40 meq via ORAL
  Filled 2024-06-15: qty 2

## 2024-06-15 MED ORDER — LOSARTAN POTASSIUM 50 MG PO TABS
100.0000 mg | ORAL_TABLET | Freq: Every day | ORAL | Status: DC
  Administered 2024-06-15 – 2024-06-16 (×2): 100 mg via ORAL
  Filled 2024-06-15 (×2): qty 2

## 2024-06-15 MED ORDER — AMLODIPINE BESYLATE 10 MG PO TABS
10.0000 mg | ORAL_TABLET | Freq: Every day | ORAL | Status: DC
  Administered 2024-06-15 – 2024-06-16 (×2): 10 mg via ORAL
  Filled 2024-06-15 (×2): qty 1

## 2024-06-15 NOTE — Care Management Obs Status (Signed)
 MEDICARE OBSERVATION STATUS NOTIFICATION   Patient Details  Name: [name] MRN: [id number] Date of Birth: 04/09/1933   Medicare Observation Status Notification Given:  Yes  Patient could not sign put understood his obs status copyu given   Wynonia Hedges 06/15/2024, 10:20 AM

## 2024-06-15 NOTE — Assessment & Plan Note (Addendum)
 06-15-2024 treated with IV Mg.  06-16-2024 stable.

## 2024-06-15 NOTE — Progress Notes (Signed)
 PROGRESS NOTE    Darin Haider  ZHY:[id number] DOB: 09-Aug-1933 DOA: 06/13/2024 PCP: Yevette Hem, FNP  Subjective: Pt seen and examined. RVP positive for parainfluenza.  CT hip negative for fracture or abscess. On RA. Now afebrile.   Hospital Course: HPI: [name] is a 89 y.o. male with medical history significant for type 2 diabetes mellitus, left total hip arthroplasty on 05/28/2024, GAD, BPH, essential pretension, hyperlipidemia, who is admitted to [facility] on 06/13/2024 with acute encephalopathy after presenting from SNF to [facility] ED for evaluation of altered mental status.    In the setting of the patient's altered mental status, the following history was provided by the SNF staff, my discussions with the EDP, and via chart review.   In the setting of osteoarthritis of the left hip, he recently underwent elective left total hip arthroplasty with Dr. Lucienne Ryder of [facility] on 05/28/2024.  On 05/31/2024, he was discharged to SNF for temporary rehab.  Over the last 1 to 2 days, SNF staff has noted the patient to be confused relative to his baseline mental status, ultimately resulting in his presentation to [facility] emergency department this evening.  Patient has reported some ongoing left hip discomfort, but was otherwise without acute complaint leading up to progressive agitation in the ED. EDP also conveys the patient had denied any acute respiratory symptoms.    Following discharge from [facility] on 05/31/24 to SNF, the patient has been prn oxycodone  for residual left hip discomfort.  His outpatient medications are also notable for scheduled baclofen  on a 3 times daily basis, as needed Valium  for history of generalized anxiety disorder, scheduled gabapentin .  He also has a history of BPH as well as urinary incontinence and is on scheduled oxybutynin  for the latter.   Per chart review, his baseline hemoglobin ranges 13.5-15.  His most recent preoperative hemoglobin was  found to be 13.9 on 05/14/2024, while his most recent prior hemoglobin, which occurred on postop day 2 status post left total hip arthroplasty, was found to be 10 when checked on 05/30/2024.  Following his left total hip arthroplasty on 05/28/2024, he has been on aspirin  81 mg p.o. twice daily.    Per chart review, most recent echocardiogram occurred in February 2013, at which time 2D echo showed LVEF 60 to 65% as well as no evidence of focal wall motion abnormalities.  Subsequently, the patient was admitted for further evaluation management of presenting acute encephalopathy, of currently unclear source, with presentation also notable for initial objective fever, lactic acidosis, hypomagnesemia, and subacute normocytic anemia.   Significant Events: Admitted 06/13/2024 for acute encephalopathy   Admission Labs: Lactic acid 2.8 WBC 8.4, HgB 10, plt 582 Na 139, K 3.5, CO2 of 23, BUN 15, Scr 1.11, glu 155 UA moderate HgB, RBC 11-20, negative nitrite, negative LE Covid/rsv/flu negative TSH 2.160 VBG pH 7.44, PCO2 33 CK 95 Fe 32, TIBC 237, %sat 14 Procal Negative RVP Positive for parainfluenza   Admission Imaging Studies: CXR No active disease   Significant Labs:   Significant Imaging Studies:   Antibiotic Therapy: Anti-infectives (From admission, onward)    Start     Dose/Rate Route Frequency Ordered Stop   06/13/24 2045  cefTRIAXone (ROCEPHIN) 2 g in sodium chloride  0.9 % 100 mL IVPB        2 g 200 mL/hr over 30 Minutes Intravenous Once 06/13/24 2042 06/13/24 2211   06/13/24 2045  azithromycin (ZITHROMAX) 500 mg in sodium chloride  0.9 % 250 mL  IVPB        500 mg 250 mL/hr over 60 Minutes Intravenous  Once 06/13/24 2042 06/13/24 2345       Procedures:   Consultants:    Assessment and Plan: * Acute metabolic encephalopathy 06-15-2024 likely due to parainfluenza infection. Pt is now awake and alert. Acute metabolic encephalopathy resolved.  Parainfluenza  infection 06-15-2024 RVP positive for parainfluenza. No specific anti-virals to treat with. Only supportive care. Pt on RA. Afebrile. WBC 7.7  Hypomagnesemia 06-15-2024 treated with IV Mg.  Lactic acidosis 06-15-2024 resolved. With IVF.  Fever 06-15-2024 RVP positive for parainfluenza. No specific anti-virals to treat with. Only supportive care. Pt on RA. Afebrile. WBC 7.7  Urinary incontinence 06-15-2024 stable.  Essential hypertension 06-15-2024 stable. Restart his ARB and norvasc .  GAD (generalized anxiety disorder) 06-15-2024 stable.  Normocytic anemia 06-15-2024 stable.  Hx of total hip arthroplasty 06-15-2024 stable. CT hip negative for abscess, no peri-prosthetic fracture.  BPH (benign prostatic hyperplasia) 06-15-2024 stable.  HLD (hyperlipidemia) 06-15-2024 stable. On lipitor.  DM2 (diabetes mellitus, type 2) (HCC) 06-15-2024 on SSI. Holding metformin .   DVT prophylaxis: enoxaparin  (LOVENOX ) injection 40 mg Start: 06/14/24 1800 SCDs Start: 06/13/24 2318    Code Status: Full Code Family Communication: no family at bedside Disposition Plan: return to SNF Reason for continuing need for hospitalization: medically stable for DC.  Objective: Vitals:   06/14/24 1117 06/14/24 1612 06/15/24 0440 06/15/24 0735  BP:  (!) 134/50 (!) 147/71 (!) 161/72  Pulse:  75 74 84  Resp:  19 18 18   Temp:  98.4 F (36.9 C) 98.8 F (37.1 C) 98 F (36.7 C)  TempSrc:   Oral Oral  SpO2:  100% 97% 100%  Weight: 89.3 kg  90.9 kg   Height: 5' 10 (1.778 m)      No intake or output data in the 24 hours ending 06/15/24 1610 Filed Weights   06/14/24 1117 06/15/24 0440  Weight: 89.3 kg 90.9 kg    Examination:  Physical Exam Vitals and nursing note reviewed.  Constitutional:      General: He is not in acute distress.    Appearance: He is not toxic-appearing or diaphoretic.  HENT:     Head: Normocephalic and atraumatic.     Nose: Nose normal.   Cardiovascular:     Rate and  Rhythm: Normal rate and regular rhythm.  Pulmonary:     Effort: Pulmonary effort is normal.     Breath sounds: Normal breath sounds.  Abdominal:     General: There is no distension.     Palpations: Abdomen is soft.   Skin:    General: Skin is warm and dry.     Capillary Refill: Capillary refill takes less than 2 seconds.     Comments: Left groin area with surgical dressing. Intact. No drainage.   Neurological:     Mental Status: He is alert and oriented to person, place, and time.     Data Reviewed: I have personally reviewed following labs and imaging studies  CBC: Recent Labs  Lab 06/13/24 2104 06/14/24 0836 06/14/24 1215  WBC 8.4 7.7  --   NEUTROABS 6.4 6.1  --   HGB 10.0* 9.2* 10.1*  HCT 30.0* 28.6* 30.1*  MCV 90.1 93.5  --   PLT 582* 486*  --    Basic Metabolic Panel: Recent Labs  Lab 06/13/24 2104 06/13/24 2112 06/14/24 0836 06/14/24 1215  NA 139  --  139  --   K 3.5  --  3.0*  --   CL 103  --  107  --   CO2 23  --  21*  --   GLUCOSE 155*  --  110*  --   BUN 15  --  9  --   CREATININE 1.11  --  0.73  --   CALCIUM  9.2  --  8.1*  --   MG  --  1.5* 8.9* 2.1   GFR: Estimated Creatinine Clearance: 71 mL/min (by C-G formula based on SCr of 0.73 mg/dL). Liver Function Tests: Recent Labs  Lab 06/13/24 2104 06/14/24 0836  AST 22 17  ALT 14 11  ALKPHOS 74 57  BILITOT 0.7 0.5  PROT 6.4* 5.6*  ALBUMIN 3.0* 2.6*   Coagulation Profile: Recent Labs  Lab 06/13/24 2104  INR 1.1   Cardiac Enzymes: Recent Labs  Lab 06/14/24 0836  CKTOTAL 95   BNP (last 3 results) Recent Labs    05/05/24 1008  BNP 31.5   CBG: Recent Labs  Lab 06/14/24 0736 06/14/24 1123 06/14/24 1612 06/15/24 0738 06/15/24 1158  GLUCAP 98 95 133* 114* 157*   Thyroid  Function Tests: Recent Labs    06/13/24 2319  TSH 2.160   Anemia Panel: Recent Labs    06/13/24 2319 06/14/24 0836 06/14/24 1215  VITAMINB12  --   --  178*  FOLATE 8.9  --   --   FERRITIN  --  186   --   TIBC  --  237*  --   IRON  --  32*  --    Sepsis Labs: Recent Labs  Lab 06/13/24 2120 06/13/24 2253 06/14/24 0836  PROCALCITON  --   --  <0.10  LATICACIDVEN 2.8* 1.7  --     Recent Results (from the past 240 hours)  Blood Culture (routine x 2)     Status: None (Preliminary result)   Collection Time: 06/13/24  9:05 PM   Specimen: BLOOD  Result Value Ref Range Status   Specimen Description BLOOD BLOOD RIGHT ARM  Final   Special Requests   Final    BOTTLES DRAWN AEROBIC AND ANAEROBIC Blood Culture adequate volume   Culture   Final    NO GROWTH 2 DAYS Performed at [facility], [address], [place], [place] [address]    Report Status PENDING  Incomplete  Blood Culture (routine x 2)     Status: Abnormal (Preliminary result)   Collection Time: 06/13/24  9:05 PM   Specimen: BLOOD LEFT ARM  Result Value Ref Range Status   Specimen Description BLOOD LEFT ARM  Final   Special Requests   Final    BOTTLES DRAWN AEROBIC AND ANAEROBIC Blood Culture adequate volume   Culture  Setup Time   Final    GRAM POSITIVE COCCI IN BOTH AEROBIC AND ANAEROBIC BOTTLES GRAM STAIN REVIEWED-AGREE WITH RESULT CRITICAL RESULT CALLED TO, READ BACK BY AND VERIFIED WITH: PHARMD NJerrilyn Moras [id number] @ 2009 FH    Culture (A)  Final    STAPHYLOCOCCUS HOMINIS THE SIGNIFICANCE OF ISOLATING THIS ORGANISM FROM A SINGLE SET OF BLOOD CULTURES WHEN MULTIPLE SETS ARE DRAWN IS UNCERTAIN. PLEASE NOTIFY THE MICROBIOLOGY DEPARTMENT WITHIN ONE WEEK IF SPECIATION AND SENSITIVITIES ARE REQUIRED. Performed at [facility], [address], [place], [place] [address]    Report Status PENDING  Incomplete  Blood Culture ID Panel (Reflexed)     Status: Abnormal   Collection Time: 06/13/24  9:05 PM  Result Value Ref Range Status  Enterococcus faecalis NOT DETECTED NOT DETECTED Final   Enterococcus Faecium NOT DETECTED NOT DETECTED Final   Listeria monocytogenes NOT DETECTED NOT DETECTED Final   Staphylococcus  species DETECTED (A) NOT DETECTED Final    Comment: CRITICAL RESULT CALLED TO, READ BACK BY AND VERIFIED WITH: Jacky Massing [id number] @ 2009 FH    Staphylococcus aureus (BCID) NOT DETECTED NOT DETECTED Final   Staphylococcus epidermidis NOT DETECTED NOT DETECTED Final   Staphylococcus lugdunensis NOT DETECTED NOT DETECTED Final   Streptococcus species NOT DETECTED NOT DETECTED Final   Streptococcus agalactiae NOT DETECTED NOT DETECTED Final   Streptococcus pneumoniae NOT DETECTED NOT DETECTED Final   Streptococcus pyogenes NOT DETECTED NOT DETECTED Final   A.calcoaceticus-baumannii NOT DETECTED NOT DETECTED Final   Bacteroides fragilis NOT DETECTED NOT DETECTED Final   Enterobacterales NOT DETECTED NOT DETECTED Final   Enterobacter cloacae complex NOT DETECTED NOT DETECTED Final   Escherichia coli NOT DETECTED NOT DETECTED Final   Klebsiella aerogenes NOT DETECTED NOT DETECTED Final   Klebsiella oxytoca NOT DETECTED NOT DETECTED Final   Klebsiella pneumoniae NOT DETECTED NOT DETECTED Final   Proteus species NOT DETECTED NOT DETECTED Final   Salmonella species NOT DETECTED NOT DETECTED Final   Serratia marcescens NOT DETECTED NOT DETECTED Final   Haemophilus influenzae NOT DETECTED NOT DETECTED Final   Neisseria meningitidis NOT DETECTED NOT DETECTED Final   Pseudomonas aeruginosa NOT DETECTED NOT DETECTED Final   Stenotrophomonas maltophilia NOT DETECTED NOT DETECTED Final   Candida albicans NOT DETECTED NOT DETECTED Final   Candida auris NOT DETECTED NOT DETECTED Final   Candida glabrata NOT DETECTED NOT DETECTED Final   Candida krusei NOT DETECTED NOT DETECTED Final   Candida parapsilosis NOT DETECTED NOT DETECTED Final   Candida tropicalis NOT DETECTED NOT DETECTED Final   Cryptococcus neoformans/gattii NOT DETECTED NOT DETECTED Final    Comment: Performed at [facility], [address], [place], [place] [address]  Resp panel by RT-PCR (RSV, Flu A&B, Covid) Anterior Nasal  Swab     Status: None   Collection Time: 06/13/24  9:55 PM   Specimen: Anterior Nasal Swab  Result Value Ref Range Status   SARS Coronavirus 2 by RT PCR NEGATIVE NEGATIVE Final   Influenza A by PCR NEGATIVE NEGATIVE Final   Influenza B by PCR NEGATIVE NEGATIVE Final    Comment: (NOTE) The Xpert Xpress SARS-CoV-2/FLU/RSV plus assay is intended as an aid in the diagnosis of influenza from Nasopharyngeal swab specimens and should not be used as a sole basis for treatment. Nasal washings and aspirates are unacceptable for Xpert Xpress SARS-CoV-2/FLU/RSV testing.  Fact Sheet for Patients: BloggerCourse.com  Fact Sheet for Healthcare Providers: SeriousBroker.it  This test is not yet approved or cleared by the [place]  FDA and has been authorized for detection and/or diagnosis of SARS-CoV-2 by FDA under an Emergency Use Authorization (EUA). This EUA will remain in effect (meaning this test can be used) for the duration of the COVID-19 declaration under Section 564(b)(1) of the Act, 21 U.S.C. section 360bbb-3(b)(1), unless the authorization is terminated or revoked.     Resp Syncytial Virus by PCR NEGATIVE NEGATIVE Final    Comment: (NOTE) Fact Sheet for Patients: BloggerCourse.com  Fact Sheet for Healthcare Providers: SeriousBroker.it  This test is not yet approved or cleared by the [place]  FDA and has been authorized for detection and/or diagnosis of SARS-CoV-2 by FDA under an Emergency Use Authorization (EUA). This EUA will remain in effect (meaning this  test can be used) for the duration of the COVID-19 declaration under Section 564(b)(1) of the Act, 21 U.S.C. section 360bbb-3(b)(1), unless the authorization is terminated or revoked.  Performed at [facility], [address], [place], [place] [address]   Respiratory (~20 pathogens) panel by PCR     Status:  Abnormal   Collection Time: 06/14/24  4:20 PM   Specimen: Nasopharyngeal Swab; Respiratory  Result Value Ref Range Status   Adenovirus NOT DETECTED NOT DETECTED Final   Coronavirus 229E NOT DETECTED NOT DETECTED Final    Comment: (NOTE) The Coronavirus on the Respiratory Panel, DOES NOT test for the novel  Coronavirus (2019 nCoV)    Coronavirus HKU1 NOT DETECTED NOT DETECTED Final   Coronavirus NL63 NOT DETECTED NOT DETECTED Final   Coronavirus OC43 NOT DETECTED NOT DETECTED Final   Metapneumovirus NOT DETECTED NOT DETECTED Final   Rhinovirus / Enterovirus NOT DETECTED NOT DETECTED Final   Influenza A NOT DETECTED NOT DETECTED Final   Influenza B NOT DETECTED NOT DETECTED Final   Parainfluenza Virus 1 NOT DETECTED NOT DETECTED Final   Parainfluenza Virus 2 NOT DETECTED NOT DETECTED Final   Parainfluenza Virus 3 DETECTED (A) NOT DETECTED Final   Parainfluenza Virus 4 NOT DETECTED NOT DETECTED Final   Respiratory Syncytial Virus NOT DETECTED NOT DETECTED Final   Bordetella pertussis NOT DETECTED NOT DETECTED Final   Bordetella Parapertussis NOT DETECTED NOT DETECTED Final   Chlamydophila pneumoniae NOT DETECTED NOT DETECTED Final   Mycoplasma pneumoniae NOT DETECTED NOT DETECTED Final    Comment: Performed at [facility], [address], [place], [place] [address]     Radiology Studies: CT HIP LEFT WO CONTRAST Result Date: 06/15/2024 CLINICAL DATA:  Fever status post recent left total hip replacement. EXAM: CT OF THE LEFT HIP WITHOUT CONTRAST TECHNIQUE: Multidetector CT imaging of the left hip was performed according to the standard protocol. Multiplanar CT image reconstructions were also generated. RADIATION DOSE REDUCTION: This exam was performed according to the departmental dose-optimization program which includes automated exposure control, adjustment of the mA and/or kV according to patient size and/or use of iterative reconstruction technique. COMPARISON:  Radiographs  05/28/2024 and 01/13/2023. FINDINGS: Bones/Joint/Cartilage Status post recent left total hip arthroplasty. The hardware is intact. No evidence of acute fracture, dislocation or hardware loosening. Left sacroiliac degenerative changes with anterior bridging osteophytes noted. Ligaments Suboptimally assessed by CT. Muscles and Tendons Expected postsurgical changes within the left hip anterior and lateral musculature. No organized intramuscular fluid collection identified. There is mild generalized atrophy. No acute tendon findings identified. There are calcifications within the common hamstring tendon. Soft tissues Left hip anterior skin staples remain in place. There is an underlying lenticular shaped air-collection in the subcutaneous fat which measures up to 6.0 x 2.9 cm transverse and extends approximately 9.6 cm in length. There is soft tissue stranding in the surrounding subcutaneous fat without other focal fluid collection. No inflammation identified in the visualized perineum. Incidentally noted is at least moderate enlargement of the prostate gland with probable protrusion into the bladder base. IMPRESSION: 1. Status post recent left total hip arthroplasty with intact hardware. 2. Lenticular shaped air-collection in the left hip anterior subcutaneous fat, likely postsurgical. Correlate clinically to exclude superficial soft tissue infection. No other focal fluid collection identified. 3. Expected postsurgical changes within the left hip anterior and lateral musculature. 4. Incidentally noted at least moderate enlargement of the prostate gland with probable protrusion into the bladder base. Electronically Signed  By: Elmon Hagedorn M.D.   On: 06/15/2024 08:52   DG Chest Port 1 View Result Date: 06/13/2024 CLINICAL DATA:  Sepsis, altered level of consciousness EXAM: PORTABLE CHEST 1 VIEW COMPARISON:  05/14/2023 FINDINGS: The heart size and mediastinal contours are within normal limits. Both lungs are  clear. The visualized skeletal structures are unremarkable. IMPRESSION: No active disease. Electronically Signed   By: Bobbye Burrow M.D.   On: 06/13/2024 21:27    Scheduled Meds:  amLODipine   10 mg Oral Daily   atorvastatin   20 mg Oral Daily   vitamin B-12  1,000 mcg Oral Daily   DULoxetine   60 mg Oral Daily   enoxaparin  (LOVENOX ) injection  40 mg Subcutaneous Q24H   finasteride   5 mg Oral QHS   insulin  aspart  0-9 Units Subcutaneous TID WC   latanoprost   1 drop Both Eyes QHS   losartan   100 mg Oral Daily   potassium chloride  40 mEq Oral Once   tamsulosin   0.8 mg Oral QHS   Continuous Infusions:   LOS: 0 days   Time spent: 50 minutes  Unk Garb, DO  [facility]  06/15/2024, 4:10 PM

## 2024-06-15 NOTE — Assessment & Plan Note (Addendum)
 06-15-2024 likely due to parainfluenza infection. Pt is now awake and alert. Acute metabolic encephalopathy resolved.  06-16-2024 resolved on 06-15-2024.

## 2024-06-15 NOTE — Assessment & Plan Note (Addendum)
 06-15-2024 stable.  06-16-2024 stable.

## 2024-06-15 NOTE — Subjective & Objective (Addendum)
 Pt seen and examined. Pt seen ambulating hallways with mobility tech. He walked about 60 feet. No concerns or complaints. On RA. No fevers.  Blood cx with staph hominis consistent with contaminant. Not a true infection.

## 2024-06-15 NOTE — Assessment & Plan Note (Addendum)
 06-15-2024 stable. Restart his ARB and norvasc .  06-16-2024 BP stable after restarting BP meds.

## 2024-06-15 NOTE — Hospital Course (Signed)
 HPI: [name] is a 89 y.o. male with medical history significant for type 2 diabetes mellitus, left total hip arthroplasty on 05/28/2024, GAD, BPH, essential pretension, hyperlipidemia, who is admitted to [facility] on 06/13/2024 with acute encephalopathy after presenting from SNF to [facility] ED for evaluation of altered mental status.    In the setting of the patient's altered mental status, the following history was provided by the SNF staff, my discussions with the EDP, and via chart review.   In the setting of osteoarthritis of the left hip, he recently underwent elective left total hip arthroplasty with Dr. Lucienne Ryder of [facility] on 05/28/2024.  On 05/31/2024, he was discharged to SNF for temporary rehab.  Over the last 1 to 2 days, SNF staff has noted the patient to be confused relative to his baseline mental status, ultimately resulting in his presentation to [facility] emergency department this evening.  Patient has reported some ongoing left hip discomfort, but was otherwise without acute complaint leading up to progressive agitation in the ED. EDP also conveys the patient had denied any acute respiratory symptoms.    Following discharge from [facility] on 05/31/24 to SNF, the patient has been prn oxycodone  for residual left hip discomfort.  His outpatient medications are also notable for scheduled baclofen  on a 3 times daily basis, as needed Valium  for history of generalized anxiety disorder, scheduled gabapentin .  He also has a history of BPH as well as urinary incontinence and is on scheduled oxybutynin  for the latter.   Per chart review, his baseline hemoglobin ranges 13.5-15.  His most recent preoperative hemoglobin was found to be 13.9 on 05/14/2024, while his most recent prior hemoglobin, which occurred on postop day 2 status post left total hip arthroplasty, was found to be 10 when checked on 05/30/2024.  Following his left total hip arthroplasty on 05/28/2024, he has been on  aspirin  81 mg p.o. twice daily.    Per chart review, most recent echocardiogram occurred in February 2013, at which time 2D echo showed LVEF 60 to 65% as well as no evidence of focal wall motion abnormalities.  Subsequently, the patient was admitted for further evaluation management of presenting acute encephalopathy, of currently unclear source, with presentation also notable for initial objective fever, lactic acidosis, hypomagnesemia, and subacute normocytic anemia.   Significant Events: Admitted 06/13/2024 for acute encephalopathy   Admission Labs: Lactic acid 2.8 WBC 8.4, HgB 10, plt 582 Na 139, K 3.5, CO2 of 23, BUN 15, Scr 1.11, glu 155 UA moderate HgB, RBC 11-20, negative nitrite, negative LE Covid/rsv/flu negative TSH 2.160 VBG pH 7.44, PCO2 33 CK 95 Fe 32, TIBC 237, %sat 14 Procal Negative RVP Positive for parainfluenza   Admission Imaging Studies: CXR No active disease   Significant Labs:   Significant Imaging Studies:   Antibiotic Therapy: Anti-infectives (From admission, onward)    Start     Dose/Rate Route Frequency Ordered Stop   06/13/24 2045  cefTRIAXone (ROCEPHIN) 2 g in sodium chloride  0.9 % 100 mL IVPB        2 g 200 mL/hr over 30 Minutes Intravenous Once 06/13/24 2042 06/13/24 2211   06/13/24 2045  azithromycin (ZITHROMAX) 500 mg in sodium chloride  0.9 % 250 mL IVPB        500 mg 250 mL/hr over 60 Minutes Intravenous  Once 06/13/24 2042 06/13/24 2345       Procedures:   Consultants:

## 2024-06-15 NOTE — Assessment & Plan Note (Addendum)
 06-15-2024 stable.  06-16-2024 continue flomax  and proscar .

## 2024-06-15 NOTE — Assessment & Plan Note (Addendum)
 06-15-2024 on SSI. Holding metformin .   06-16-2024 CBG stable and controlled.

## 2024-06-15 NOTE — Assessment & Plan Note (Addendum)
 06-15-2024 RVP positive for parainfluenza. No specific anti-virals to treat with. Only supportive care. Pt on RA. Afebrile. WBC 7.7  06-16-2024 stable. On RA. No fevers. Continue supportive care.

## 2024-06-15 NOTE — Assessment & Plan Note (Addendum)
 06-15-2024 RVP positive for parainfluenza. No specific anti-virals to treat with. Only supportive care. Pt on RA. Afebrile. WBC 7.7  06-16-2024 resolved.

## 2024-06-15 NOTE — Evaluation (Signed)
 Physical Therapy Evaluation Patient Details Name: [name] MRN: [id number] DOB: [birth year] Today's Date: 06/15/2024  History of Present Illness  Pt is 89 yo male who presents on 06/13/24 from SNF with AMS. PMH:  DM2, left THA on 05/28/2024, GAD, BPH, essential hypertension, hyperlipidemia, anxiety, back pain, personality disorder, vocal nodules  Clinical Impression  Pt admitted with above diagnosis. Pt from [facility] though he thinks that he came directly here from [facility]. Pt with obvious STM deficits and problem solving/ sequencing deficits as well as the need for frequent redirection to task. Pt needed min A to come to EOB and rise to standing. Is not yet able to lift LLE against gravity. Ambulated 26' with RW and min A. Pt given handout of THA exercises and practiced them with him. Patient will benefit from continued inpatient follow up therapy, <3 hours/day to finish his time in rehab.  Pt currently with functional limitations due to the deficits listed below (see PT Problem List). Pt will benefit from acute skilled PT to increase their independence and safety with mobility to allow discharge.           If plan is discharge home, recommend the following: Assistance with cooking/housework;Help with stairs or ramp for entrance;Assist for transportation;A little help with walking and/or transfers;A little help with bathing/dressing/bathroom   Can travel by private vehicle   Yes    Equipment Recommendations None recommended by PT  Recommendations for Other Services       Functional Status Assessment Patient has had a recent decline in their functional status and demonstrates the ability to make significant improvements in function in a reasonable and predictable amount of time.     Precautions / Restrictions Precautions Precautions: Fall Restrictions Weight Bearing Restrictions Per Provider Order: No LLE Weight Bearing Per Provider Order: Weight bearing as tolerated       Mobility  Bed Mobility Overal bed mobility: Needs Assistance Bed Mobility: Supine to Sit     Supine to sit: Min assist, Used rails, HOB elevated     General bed mobility comments: Min Assist needed only to advance LLE off left side of bed. Pt using rails heavily to control trunk and need of cues to motor plan for transition to EOB sitting.    Transfers Overall transfer level: Needs assistance Equipment used: Rolling walker (2 wheels) Transfers: Sit to/from Stand Sit to Stand: Min assist           General transfer comment: heavy reliance on RW, min assist to rise and steady    Ambulation/Gait Ambulation/Gait assistance: Min assist, +2 safety/equipment Gait Distance (Feet): 50 Feet Assistive device: Rolling walker (2 wheels) Gait Pattern/deviations: Decreased stance time - left, Antalgic, Trunk flexed, Step-through pattern Gait velocity: decr Gait velocity interpretation: <1.31 ft/sec, indicative of household ambulator   General Gait Details: chair brought behind pt for safety but pt ultimately did well knowing his limits. Fatigued at 22' with increased trunk flexion. vc's for safety when sitting to chair  Stairs            Wheelchair Mobility     Tilt Bed    Modified Rankin (Stroke Patients Only)       Balance Overall balance assessment: Needs assistance Sitting-balance support: Single extremity supported, Bilateral upper extremity supported, Feet unsupported (During LE ROM and MMT test pt with posterior lean) Sitting balance-Leahy Scale: Fair     Standing balance support: During functional activity, Reliant on assistive device for balance Standing balance-Leahy Scale: Poor  Pertinent Vitals/Pain Pain Assessment Pain Assessment: 0-10 Pain Score: 3  Pain Location: left hip with movement Pain Descriptors / Indicators: Discomfort, Grimacing Pain Intervention(s): Limited activity within patient's tolerance,  Monitored during session    Home Living Family/patient expects to be discharged to:: Skilled nursing facility Living Arrangements: Other (Comment)               Home Equipment: Agricultural consultant (2 wheels) Additional Comments: per chart and pt, lived alone before THA    Prior Function Prior Level of Function : Needs assist             Mobility Comments: has been ambulating with RW since THA, pt reports ~150' per day ADLs Comments: assist from SNF staff     Extremity/Trunk Assessment   Upper Extremity Assessment Upper Extremity Assessment: Defer to OT evaluation RUE Deficits / Details: 2+ edema dorsal R hand    Lower Extremity Assessment Lower Extremity Assessment: LLE deficits/detail LLE Deficits / Details: hip flex 2/5, knee ext 3-/5 LLE Sensation: WNL LLE Coordination: decreased gross motor    Cervical / Trunk Assessment Cervical / Trunk Assessment: Other exceptions Cervical / Trunk Exceptions: forward flexed posture, pt not interested in working on this I've been like this for years  Communication   Communication Communication: Impaired Factors Affecting Communication: Other (comment) (Vocal chord strain (?). Pt avoiding speech ad lib to allow vocals to rest. When pt does speak, it is very clear.)    Cognition Arousal: Alert Behavior During Therapy: WFL for tasks assessed/performed   PT - Cognitive impairments: No family/caregiver present to determine baseline, Difficult to assess, Memory, Attention, Problem solving, Safety/Judgement, Sequencing                       PT - Cognition Comments: has no memory of going to SNF, thinks he came straight here from [facility] from his hip surgery. Needs frequent redirection to task Following commands: Impaired Following commands impaired: Only follows one step commands consistently (requires repitition)     Cueing Cueing Techniques: Verbal cues, Tactile cues     General Comments General comments (skin  integrity, edema, etc.): pt given surgical hip handout to work on LLE exercises in chair    Exercises Total Joint Exercises Ankle Circles/Pumps: AROM, Both, 10 reps Quad Sets: AROM, Both, 10 reps, Seated Gluteal Sets: AROM, Both, 10 reps, Seated Heel Slides: Left, AROM, 5 reps, Seated Straight Leg Raises: AAROM, Left, 10 reps, Seated Long Arc Quad: AROM, Left, 5 reps, Seated   Assessment/Plan    PT Assessment Patient needs continued PT services  PT Problem List Decreased strength;Decreased activity tolerance;Decreased balance;Decreased mobility;Decreased knowledge of use of DME;Decreased safety awareness;Pain;Decreased cognition;Decreased coordination;Decreased range of motion       PT Treatment Interventions Gait training;DME instruction;Balance training;Functional mobility training;Therapeutic activities;Therapeutic exercise;Cognitive remediation;Patient/family education    PT Goals (Current goals can be found in the Care Plan section)  Acute Rehab PT Goals Patient Stated Goal: get back home PT Goal Formulation: With patient Time For Goal Achievement: 06/29/24 Potential to Achieve Goals: Good    Frequency Min 2X/week     Co-evaluation PT/OT/SLP Co-Evaluation/Treatment: Yes Reason for Co-Treatment: Necessary to address cognition/behavior during functional activity;For patient/therapist safety;To address functional/ADL transfers PT goals addressed during session: Mobility/safety with mobility;Proper use of DME;Balance OT goals addressed during session: ADL's and self-care       AM-PAC PT 6 Clicks Mobility  Outcome Measure Help needed turning from your back to your side while in  a flat bed without using bedrails?: A Little Help needed moving from lying on your back to sitting on the side of a flat bed without using bedrails?: A Little Help needed moving to and from a bed to a chair (including a wheelchair)?: A Little Help needed standing up from a chair using your arms  (e.g., wheelchair or bedside chair)?: A Little Help needed to walk in hospital room?: A Little Help needed climbing 3-5 steps with a railing? : A Lot 6 Click Score: 17    End of Session Equipment Utilized During Treatment: Gait belt Activity Tolerance: Patient tolerated treatment well;Patient limited by fatigue Patient left: in chair;with call bell/phone within reach;with chair alarm set Nurse Communication: Mobility status PT Visit Diagnosis: Difficulty in walking, not elsewhere classified (R26.2);Pain Pain - Right/Left: Left Pain - part of body: Hip    Time: 0272-5366 PT Time Calculation (min) (ACUTE ONLY): 36 min   Charges:   PT Evaluation $PT Eval Moderate Complexity: 1 Mod PT Treatments $Gait Training: 8-22 mins PT General Charges $$ ACUTE PT VISIT: 1 Visit         Amey Ka, PT  Acute Rehab Services Secure chat preferred Office [phone]   Deloris Fetters Shantee Hayne 06/15/2024, 1:58 PM

## 2024-06-15 NOTE — Assessment & Plan Note (Addendum)
 06-15-2024 resolved. With IVF.

## 2024-06-15 NOTE — Assessment & Plan Note (Addendum)
 06-15-2024 stable. CT hip negative for abscess, no peri-prosthetic fracture.  06-16-2024 stable. Pt able to walk 60 feet today. Needs continued PT at SNF.
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
## 2024-06-15 NOTE — Assessment & Plan Note (Addendum)
 06-15-2024 stable. On lipitor.  06-16-2024

## 2024-06-16 DIAGNOSIS — R6510 Systemic inflammatory response syndrome (SIRS) of non-infectious origin without acute organ dysfunction: Secondary | ICD-10-CM | POA: Diagnosis not present

## 2024-06-16 DIAGNOSIS — K219 Gastro-esophageal reflux disease without esophagitis: Secondary | ICD-10-CM | POA: Diagnosis not present

## 2024-06-16 DIAGNOSIS — Z743 Need for continuous supervision: Secondary | ICD-10-CM | POA: Diagnosis not present

## 2024-06-16 DIAGNOSIS — H401190 Primary open-angle glaucoma, unspecified eye, stage unspecified: Secondary | ICD-10-CM | POA: Diagnosis not present

## 2024-06-16 DIAGNOSIS — Z96642 Presence of left artificial hip joint: Secondary | ICD-10-CM | POA: Diagnosis not present

## 2024-06-16 DIAGNOSIS — Z7984 Long term (current) use of oral hypoglycemic drugs: Secondary | ICD-10-CM | POA: Diagnosis not present

## 2024-06-16 DIAGNOSIS — M1612 Unilateral primary osteoarthritis, left hip: Secondary | ICD-10-CM | POA: Diagnosis not present

## 2024-06-16 DIAGNOSIS — R0902 Hypoxemia: Secondary | ICD-10-CM | POA: Diagnosis not present

## 2024-06-16 DIAGNOSIS — R509 Fever, unspecified: Secondary | ICD-10-CM | POA: Diagnosis not present

## 2024-06-16 DIAGNOSIS — H409 Unspecified glaucoma: Secondary | ICD-10-CM | POA: Diagnosis not present

## 2024-06-16 DIAGNOSIS — M159 Polyosteoarthritis, unspecified: Secondary | ICD-10-CM | POA: Diagnosis not present

## 2024-06-16 DIAGNOSIS — G9340 Encephalopathy, unspecified: Secondary | ICD-10-CM | POA: Diagnosis not present

## 2024-06-16 DIAGNOSIS — G9341 Metabolic encephalopathy: Secondary | ICD-10-CM | POA: Diagnosis not present

## 2024-06-16 DIAGNOSIS — Z9189 Other specified personal risk factors, not elsewhere classified: Secondary | ICD-10-CM | POA: Diagnosis not present

## 2024-06-16 DIAGNOSIS — Z7401 Bed confinement status: Secondary | ICD-10-CM | POA: Diagnosis not present

## 2024-06-16 DIAGNOSIS — I739 Peripheral vascular disease, unspecified: Secondary | ICD-10-CM | POA: Diagnosis not present

## 2024-06-16 DIAGNOSIS — Z7982 Long term (current) use of aspirin: Secondary | ICD-10-CM | POA: Diagnosis not present

## 2024-06-16 DIAGNOSIS — Z79899 Other long term (current) drug therapy: Secondary | ICD-10-CM | POA: Diagnosis not present

## 2024-06-16 DIAGNOSIS — Z471 Aftercare following joint replacement surgery: Secondary | ICD-10-CM | POA: Diagnosis not present

## 2024-06-16 DIAGNOSIS — E785 Hyperlipidemia, unspecified: Secondary | ICD-10-CM | POA: Diagnosis not present

## 2024-06-16 DIAGNOSIS — E119 Type 2 diabetes mellitus without complications: Secondary | ICD-10-CM | POA: Diagnosis not present

## 2024-06-16 DIAGNOSIS — Z87891 Personal history of nicotine dependence: Secondary | ICD-10-CM | POA: Diagnosis not present

## 2024-06-16 DIAGNOSIS — I10 Essential (primary) hypertension: Secondary | ICD-10-CM | POA: Diagnosis not present

## 2024-06-16 DIAGNOSIS — Z794 Long term (current) use of insulin: Secondary | ICD-10-CM | POA: Diagnosis not present

## 2024-06-16 DIAGNOSIS — D649 Anemia, unspecified: Secondary | ICD-10-CM | POA: Diagnosis not present

## 2024-06-16 DIAGNOSIS — J45909 Unspecified asthma, uncomplicated: Secondary | ICD-10-CM | POA: Diagnosis not present

## 2024-06-16 DIAGNOSIS — Z7901 Long term (current) use of anticoagulants: Secondary | ICD-10-CM | POA: Diagnosis not present

## 2024-06-16 DIAGNOSIS — B348 Other viral infections of unspecified site: Secondary | ICD-10-CM | POA: Diagnosis not present

## 2024-06-16 LAB — CULTURE, BLOOD (ROUTINE X 2): Special Requests: ADEQUATE

## 2024-06-16 LAB — GLUCOSE, CAPILLARY
Glucose-Capillary: 130 mg/dL — ABNORMAL HIGH (ref 70–99)
Glucose-Capillary: 151 mg/dL — ABNORMAL HIGH (ref 70–99)
Glucose-Capillary: 146 mg/dL — ABNORMAL HIGH (ref 70–99)
Glucose-Capillary: 147 mg/dL — ABNORMAL HIGH (ref 70–99)

## 2024-06-16 MED ORDER — DIAZEPAM 5 MG PO TABS: 5.0000 mg | ORAL_TABLET | Freq: Two times a day (BID) | ORAL | 0 refills | Status: AC | PRN

## 2024-06-16 MED ORDER — TORSEMIDE 20 MG PO TABS: 20.0000 mg | ORAL_TABLET | Freq: Every day | ORAL | Status: DC | PRN

## 2024-06-16 MED ORDER — TOUJEO MAX SOLOSTAR 300 UNIT/ML ~~LOC~~ SOPN: 5.0000 [IU] | PEN_INJECTOR | Freq: Every day | SUBCUTANEOUS | Status: AC

## 2024-06-16 MED ORDER — CYANOCOBALAMIN 1000 MCG PO TABS: 1000.0000 ug | ORAL_TABLET | Freq: Every day | ORAL | Status: AC

## 2024-06-16 NOTE — Progress Notes (Signed)
 PROGRESS NOTE    [name]  ZOX:[id number] DOB: Jul 08, 1933 DOA: 06/13/2024 PCP: Yevette Hem, FNP  Subjective: Pt seen and examined. Pt seen ambulating hallways with mobility tech. He walked about 60 feet. No concerns or complaints. On RA. No fevers.  Blood cx with staph hominis consistent with contaminant. Not a true infection.   Hospital Course: HPI: [name] is a 89 y.o. male with medical history significant for type 2 diabetes mellitus, left total hip arthroplasty on 05/28/2024, GAD, BPH, essential pretension, hyperlipidemia, who is admitted to [facility] on 06/13/2024 with acute encephalopathy after presenting from SNF to [facility] ED for evaluation of altered mental status.    In the setting of the patient's altered mental status, the following history was provided by the SNF staff, my discussions with the EDP, and via chart review.   In the setting of osteoarthritis of the left hip, he recently underwent elective left total hip arthroplasty with Dr. Lucienne Ryder of [facility] on 05/28/2024.  On 05/31/2024, he was discharged to SNF for temporary rehab.  Over the last 1 to 2 days, SNF staff has noted the patient to be confused relative to his baseline mental status, ultimately resulting in his presentation to [facility] emergency department this evening.  Patient has reported some ongoing left hip discomfort, but was otherwise without acute complaint leading up to progressive agitation in the ED. EDP also conveys the patient had denied any acute respiratory symptoms.    Following discharge from [facility] on 05/31/24 to SNF, the patient has been prn oxycodone  for residual left hip discomfort.  His outpatient medications are also notable for scheduled baclofen  on a 3 times daily basis, as needed Valium  for history of generalized anxiety disorder, scheduled gabapentin .  He also has a history of BPH as well as urinary incontinence and is on scheduled oxybutynin  for the latter.    Per chart review, his baseline hemoglobin ranges 13.5-15.  His most recent preoperative hemoglobin was found to be 13.9 on 05/14/2024, while his most recent prior hemoglobin, which occurred on postop day 2 status post left total hip arthroplasty, was found to be 10 when checked on 05/30/2024.  Following his left total hip arthroplasty on 05/28/2024, he has been on aspirin  81 mg p.o. twice daily.    Per chart review, most recent echocardiogram occurred in February 2013, at which time 2D echo showed LVEF 60 to 65% as well as no evidence of focal wall motion abnormalities.  Subsequently, the patient was admitted for further evaluation management of presenting acute encephalopathy, of currently unclear source, with presentation also notable for initial objective fever, lactic acidosis, hypomagnesemia, and subacute normocytic anemia.   Significant Events: Admitted 06/13/2024 for acute encephalopathy   Admission Labs: Lactic acid 2.8 WBC 8.4, HgB 10, plt 582 Na 139, K 3.5, CO2 of 23, BUN 15, Scr 1.11, glu 155 UA moderate HgB, RBC 11-20, negative nitrite, negative LE Covid/rsv/flu negative TSH 2.160 VBG pH 7.44, PCO2 33 CK 95 Fe 32, TIBC 237, %sat 14 Procal Negative RVP Positive for parainfluenza   Admission Imaging Studies: CXR No active disease   Significant Labs:   Significant Imaging Studies:   Antibiotic Therapy: Anti-infectives (From admission, onward)    Start     Dose/Rate Route Frequency Ordered Stop   06/13/24 2045  cefTRIAXone (ROCEPHIN) 2 g in sodium chloride  0.9 % 100 mL IVPB        2 g 200 mL/hr over 30 Minutes Intravenous Once 06/13/24 2042 06/13/24  2211   06/13/24 2045  azithromycin (ZITHROMAX) 500 mg in sodium chloride  0.9 % 250 mL IVPB        500 mg 250 mL/hr over 60 Minutes Intravenous  Once 06/13/24 2042 06/13/24 2345       Procedures:   Consultants:     Assessment and Plan: * Acute metabolic encephalopathy 06-15-2024 likely due to parainfluenza  infection. Pt is now awake and alert. Acute metabolic encephalopathy resolved.  06-16-2024 resolved on 06-15-2024.  Parainfluenza infection 06-15-2024 RVP positive for parainfluenza. No specific anti-virals to treat with. Only supportive care. Pt on RA. Afebrile. WBC 7.7  06-16-2024 stable. On RA. No fevers. Continue supportive care.  Hypomagnesemia 06-15-2024 treated with IV Mg.  06-16-2024 stable.  Lactic acidosis 06-15-2024 resolved. With IVF.  Fever 06-15-2024 RVP positive for parainfluenza. No specific anti-virals to treat with. Only supportive care. Pt on RA. Afebrile. WBC 7.7  06-16-2024 resolved.  Urinary incontinence 06-15-2024 stable.  06-16-2024 stable.  Essential hypertension 06-15-2024 stable. Restart his ARB and norvasc .  06-16-2024 BP stable after restarting BP meds.  GAD (generalized anxiety disorder) 06-15-2024 stable.  06-16-2024 stable.  Normocytic anemia 06-15-2024 stable.  06-16-2024 stable.  Hx of total hip arthroplasty 06-15-2024 stable. CT hip negative for abscess, no peri-prosthetic fracture.  06-16-2024 stable. Pt able to walk 60 feet today. Needs continued PT at SNF.  BPH (benign prostatic hyperplasia) 06-15-2024 stable.  06-16-2024 continue flomax  and proscar .  HLD (hyperlipidemia) 06-15-2024 stable. On lipitor.  06-16-2024   DM2 (diabetes mellitus, type 2) (HCC) 06-15-2024 on SSI. Holding metformin .   06-16-2024 CBG stable and controlled.      DVT prophylaxis: enoxaparin  (LOVENOX ) injection 40 mg Start: 06/14/24 1800 SCDs Start: 06/13/24 2318    Code Status: Full Code Family Communication: no family at bedside Disposition Plan: return to SNF Reason for continuing need for hospitalization: medically stable for DC. Awaiting ins authorization.  Objective: Vitals:   06/15/24 1610 06/15/24 2118 06/16/24 0608 06/16/24 0830  BP: (!) 148/73 (!) 156/66 (!) 144/64 (!) 152/76  Pulse: 74 79 76 86  Resp: 18 18 18 18   Temp: 98.5  F (36.9 C) 98 F (36.7 C) 98.3 F (36.8 C)   TempSrc: Oral Oral Oral   SpO2: 100% 98% 98% 98%  Weight:      Height:        Intake/Output Summary (Last 24 hours) at 06/16/2024 1158 Last data filed at 06/16/2024 0500 Gross per 24 hour  Intake --  Output 700 ml  Net -700 ml   Filed Weights   06/14/24 1117 06/15/24 0440  Weight: 89.3 kg 90.9 kg    Examination:  Physical Exam Vitals and nursing note reviewed.  Constitutional:      General: He is not in acute distress.    Appearance: He is normal weight. He is not toxic-appearing or diaphoretic.  HENT:     Head: Normocephalic and atraumatic.     Nose: Nose normal.  Pulmonary:     Effort: No respiratory distress.  Abdominal:     General: There is no distension.   Neurological:     Mental Status: He is alert and oriented to person, place, and time.     Comments: Walking with walker and mobility tech    Data Reviewed: I have personally reviewed following labs and imaging studies  CBC: Recent Labs  Lab 06/13/24 2104 06/14/24 0836 06/14/24 1215  WBC 8.4 7.7  --   NEUTROABS 6.4 6.1  --   HGB 10.0* 9.2* 10.1*  HCT 30.0* 28.6* 30.1*  MCV 90.1 93.5  --   PLT 582* 486*  --    Basic Metabolic Panel: Recent Labs  Lab 06/13/24 2104 06/13/24 2112 06/14/24 0836 06/14/24 1215  NA 139  --  139  --   K 3.5  --  3.0*  --   CL 103  --  107  --   CO2 23  --  21*  --   GLUCOSE 155*  --  110*  --   BUN 15  --  9  --   CREATININE 1.11  --  0.73  --   CALCIUM  9.2  --  8.1*  --   MG  --  1.5* 8.9* 2.1   GFR: Estimated Creatinine Clearance: 71 mL/min (by C-G formula based on SCr of 0.73 mg/dL). Liver Function Tests: Recent Labs  Lab 06/13/24 2104 06/14/24 0836  AST 22 17  ALT 14 11  ALKPHOS 74 57  BILITOT 0.7 0.5  PROT 6.4* 5.6*  ALBUMIN 3.0* 2.6*   Coagulation Profile: Recent Labs  Lab 06/13/24 2104  INR 1.1   Cardiac Enzymes: Recent Labs  Lab 06/14/24 0836  CKTOTAL 95   BNP (last 3 results) Recent  Labs    05/05/24 1008  BNP 31.5   CBG: Recent Labs  Lab 06/15/24 1609 06/15/24 2118 06/16/24 0746 06/16/24 0828 06/16/24 1121  GLUCAP 117* 155* 130* 151* 146*   Lipid Profile: No results for input(s): CHOL, HDL, LDLCALC, TRIG, CHOLHDL, LDLDIRECT in the last 72 hours. Thyroid  Function Tests: Recent Labs    06/13/24 2319  TSH 2.160   Anemia Panel: Recent Labs    06/13/24 2319 06/14/24 0836 06/14/24 1215  VITAMINB12  --   --  178*  FOLATE 8.9  --   --   FERRITIN  --  186  --   TIBC  --  237*  --   IRON  --  32*  --    Sepsis Labs: Recent Labs  Lab 06/13/24 2120 06/13/24 2253 06/14/24 0836  PROCALCITON  --   --  <0.10  LATICACIDVEN 2.8* 1.7  --     Recent Results (from the past 240 hours)  Blood Culture (routine x 2)     Status: None (Preliminary result)   Collection Time: 06/13/24  9:05 PM   Specimen: BLOOD  Result Value Ref Range Status   Specimen Description BLOOD BLOOD RIGHT ARM  Final   Special Requests   Final    BOTTLES DRAWN AEROBIC AND ANAEROBIC Blood Culture adequate volume   Culture   Final    NO GROWTH 3 DAYS Performed at [facility], [address] [place], [place] [address]    Report Status PENDING  Incomplete  Blood Culture (routine x 2)     Status: Abnormal   Collection Time: 06/13/24  9:05 PM   Specimen: BLOOD LEFT ARM  Result Value Ref Range Status   Specimen Description BLOOD LEFT ARM  Final   Special Requests   Final    BOTTLES DRAWN AEROBIC AND ANAEROBIC Blood Culture adequate volume   Culture  Setup Time   Final    GRAM POSITIVE COCCI IN BOTH AEROBIC AND ANAEROBIC BOTTLES GRAM STAIN REVIEWED-AGREE WITH RESULT CRITICAL RESULT CALLED TO, READ BACK BY AND VERIFIED WITH: PHARMD NJerrilyn Moras [id number] @ 2009 FH    Culture (A)  Final    STAPHYLOCOCCUS HOMINIS THE SIGNIFICANCE OF ISOLATING THIS ORGANISM FROM A SINGLE SET OF BLOOD CULTURES WHEN MULTIPLE SETS  ARE DRAWN IS UNCERTAIN. PLEASE NOTIFY THE MICROBIOLOGY DEPARTMENT  WITHIN ONE WEEK IF SPECIATION AND SENSITIVITIES ARE REQUIRED. Performed at [facility] Lab, [address], [place], [place] [address]    Report Status 06/16/2024 FINAL  Final  Blood Culture ID Panel (Reflexed)     Status: Abnormal   Collection Time: 06/13/24  9:05 PM  Result Value Ref Range Status   Enterococcus faecalis NOT DETECTED NOT DETECTED Final   Enterococcus Faecium NOT DETECTED NOT DETECTED Final   Listeria monocytogenes NOT DETECTED NOT DETECTED Final   Staphylococcus species DETECTED (A) NOT DETECTED Final    Comment: CRITICAL RESULT CALLED TO, READ BACK BY AND VERIFIED WITH: Jacky Massing [id number] @ 2009 FH    Staphylococcus aureus (BCID) NOT DETECTED NOT DETECTED Final   Staphylococcus epidermidis NOT DETECTED NOT DETECTED Final   Staphylococcus lugdunensis NOT DETECTED NOT DETECTED Final   Streptococcus species NOT DETECTED NOT DETECTED Final   Streptococcus agalactiae NOT DETECTED NOT DETECTED Final   Streptococcus pneumoniae NOT DETECTED NOT DETECTED Final   Streptococcus pyogenes NOT DETECTED NOT DETECTED Final   A.calcoaceticus-baumannii NOT DETECTED NOT DETECTED Final   Bacteroides fragilis NOT DETECTED NOT DETECTED Final   Enterobacterales NOT DETECTED NOT DETECTED Final   Enterobacter cloacae complex NOT DETECTED NOT DETECTED Final   Escherichia coli NOT DETECTED NOT DETECTED Final   Klebsiella aerogenes NOT DETECTED NOT DETECTED Final   Klebsiella oxytoca NOT DETECTED NOT DETECTED Final   Klebsiella pneumoniae NOT DETECTED NOT DETECTED Final   Proteus species NOT DETECTED NOT DETECTED Final   Salmonella species NOT DETECTED NOT DETECTED Final   Serratia marcescens NOT DETECTED NOT DETECTED Final   Haemophilus influenzae NOT DETECTED NOT DETECTED Final   Neisseria meningitidis NOT DETECTED NOT DETECTED Final   Pseudomonas aeruginosa NOT DETECTED NOT DETECTED Final   Stenotrophomonas maltophilia NOT DETECTED NOT DETECTED Final   Candida albicans NOT  DETECTED NOT DETECTED Final   Candida auris NOT DETECTED NOT DETECTED Final   Candida glabrata NOT DETECTED NOT DETECTED Final   Candida krusei NOT DETECTED NOT DETECTED Final   Candida parapsilosis NOT DETECTED NOT DETECTED Final   Candida tropicalis NOT DETECTED NOT DETECTED Final   Cryptococcus neoformans/gattii NOT DETECTED NOT DETECTED Final    Comment: Performed at [facility], [address] [place], [place] [address]  Resp panel by RT-PCR (RSV, Flu A&B, Covid) Anterior Nasal Swab     Status: None   Collection Time: 06/13/24  9:55 PM   Specimen: Anterior Nasal Swab  Result Value Ref Range Status   SARS Coronavirus 2 by RT PCR NEGATIVE NEGATIVE Final   Influenza A by PCR NEGATIVE NEGATIVE Final   Influenza B by PCR NEGATIVE NEGATIVE Final    Comment: (NOTE) The Xpert Xpress SARS-CoV-2/FLU/RSV plus assay is intended as an aid in the diagnosis of influenza from Nasopharyngeal swab specimens and should not be used as a sole basis for treatment. Nasal washings and aspirates are unacceptable for Xpert Xpress SARS-CoV-2/FLU/RSV testing.  Fact Sheet for Patients: BloggerCourse.com  Fact Sheet for Healthcare Providers: SeriousBroker.it  This test is not yet approved or cleared by the [place]  FDA and has been authorized for detection and/or diagnosis of SARS-CoV-2 by FDA under an Emergency Use Authorization (EUA). This EUA will remain in effect (meaning this test can be used) for the duration of the COVID-19 declaration under Section 564(b)(1) of the Act, 21 U.S.C. section 360bbb-3(b)(1), unless the authorization is terminated or revoked.  Resp Syncytial Virus by PCR NEGATIVE NEGATIVE Final    Comment: (NOTE) Fact Sheet for Patients: BloggerCourse.com  Fact Sheet for Healthcare Providers: SeriousBroker.it  This test is not yet approved or cleared by the [place] FDA and has been authorized for detection and/or diagnosis of SARS-CoV-2 by FDA under an Emergency Use Authorization (EUA). This EUA will remain in effect (meaning this test can be used) for the duration of the COVID-19 declaration under Section 564(b)(1) of the Act, 21 U.S.C. section 360bbb-3(b)(1), unless the authorization is terminated or revoked.  Performed at [facility], [address] [place], [place] [address]   Respiratory (~20 pathogens) panel by PCR     Status: Abnormal   Collection Time: 06/14/24  4:20 PM   Specimen: Nasopharyngeal Swab; Respiratory  Result Value Ref Range Status   Adenovirus NOT DETECTED NOT DETECTED Final   Coronavirus 229E NOT DETECTED NOT DETECTED Final    Comment: (NOTE) The Coronavirus on the Respiratory Panel, DOES NOT test for the novel  Coronavirus (2019 nCoV)    Coronavirus HKU1 NOT DETECTED NOT DETECTED Final   Coronavirus NL63 NOT DETECTED NOT DETECTED Final   Coronavirus OC43 NOT DETECTED NOT DETECTED Final   Metapneumovirus NOT DETECTED NOT DETECTED Final   Rhinovirus / Enterovirus NOT DETECTED NOT DETECTED Final   Influenza A NOT DETECTED NOT DETECTED Final   Influenza B NOT DETECTED NOT DETECTED Final   Parainfluenza Virus 1 NOT DETECTED NOT DETECTED Final   Parainfluenza Virus 2 NOT DETECTED NOT DETECTED Final   Parainfluenza Virus 3 DETECTED (A) NOT DETECTED Final   Parainfluenza Virus 4 NOT DETECTED NOT DETECTED Final   Respiratory Syncytial Virus NOT DETECTED NOT DETECTED Final   Bordetella pertussis NOT DETECTED NOT DETECTED Final   Bordetella Parapertussis NOT DETECTED NOT DETECTED Final   Chlamydophila pneumoniae NOT DETECTED NOT DETECTED Final   Mycoplasma pneumoniae NOT DETECTED NOT DETECTED Final    Comment: Performed at [facility], [address], [place], [place] [address]     Radiology Studies: CT HIP LEFT WO CONTRAST Result Date: 06/15/2024 CLINICAL DATA:  Fever status post recent left total  hip replacement. EXAM: CT OF THE LEFT HIP WITHOUT CONTRAST TECHNIQUE: Multidetector CT imaging of the left hip was performed according to the standard protocol. Multiplanar CT image reconstructions were also generated. RADIATION DOSE REDUCTION: This exam was performed according to the departmental dose-optimization program which includes automated exposure control, adjustment of the mA and/or kV according to patient size and/or use of iterative reconstruction technique. COMPARISON:  Radiographs 05/28/2024 and 01/13/2023. FINDINGS: Bones/Joint/Cartilage Status post recent left total hip arthroplasty. The hardware is intact. No evidence of acute fracture, dislocation or hardware loosening. Left sacroiliac degenerative changes with anterior bridging osteophytes noted. Ligaments Suboptimally assessed by CT. Muscles and Tendons Expected postsurgical changes within the left hip anterior and lateral musculature. No organized intramuscular fluid collection identified. There is mild generalized atrophy. No acute tendon findings identified. There are calcifications within the common hamstring tendon. Soft tissues Left hip anterior skin staples remain in place. There is an underlying lenticular shaped air-collection in the subcutaneous fat which measures up to 6.0 x 2.9 cm transverse and extends approximately 9.6 cm in length. There is soft tissue stranding in the surrounding subcutaneous fat without other focal fluid collection. No inflammation identified in the visualized perineum. Incidentally noted is at least moderate enlargement of the prostate gland with probable protrusion into the bladder base. IMPRESSION: 1. Status post recent left  total hip arthroplasty with intact hardware. 2. Lenticular shaped air-collection in the left hip anterior subcutaneous fat, likely postsurgical. Correlate clinically to exclude superficial soft tissue infection. No other focal fluid collection identified. 3. Expected postsurgical changes  within the left hip anterior and lateral musculature. 4. Incidentally noted at least moderate enlargement of the prostate gland with probable protrusion into the bladder base. Electronically Signed   By: Elmon Hagedorn M.D.   On: 06/15/2024 08:52    Scheduled Meds:  amLODipine   10 mg Oral Daily   atorvastatin   20 mg Oral Daily   vitamin B-12  1,000 mcg Oral Daily   DULoxetine   60 mg Oral Daily   enoxaparin  (LOVENOX ) injection  40 mg Subcutaneous Q24H   finasteride   5 mg Oral QHS   insulin  aspart  0-9 Units Subcutaneous TID WC   latanoprost   1 drop Both Eyes QHS   losartan   100 mg Oral Daily   tamsulosin   0.8 mg Oral QHS   Continuous Infusions:   LOS: 0 days   Time spent: 55 minutes  Unk Garb, DO  [facility]  06/16/2024, 11:58 AM

## 2024-06-16 NOTE — Progress Notes (Signed)
 Mobility Specialist Progress Note:   06/16/24 0900  Mobility  Activity Ambulated with assistance in hallway  Level of Assistance Contact guard assist, steadying assist  Assistive Device Front wheel walker  Distance Ambulated (ft) 60 ft (w/ chair follow)  LLE Weight Bearing Per Provider Order WBAT  Activity Response Tolerated well  Mobility Referral Yes  Mobility visit 1 Mobility  Mobility Specialist Start Time (ACUTE ONLY) [id number]  Mobility Specialist Stop Time (ACUTE ONLY) 0849  Mobility Specialist Time Calculation (min) (ACUTE ONLY) 25 min   Pt received in bed, eager for mobility. Required minA to come EOB and stand. Only contact guard for ambulation. MS pulled recliner behind while ambulating w/ pt. Took x1 seated rest break then wheeled back to room. Pt left in chair with call bell and all needs met. Chair alarm on.  D'Vante Nolon Baxter Mobility Specialist Please contact via Special educational needs teacher or Rehab office at [phone]

## 2024-06-16 NOTE — [place] FL2 (Signed)
 [place]  MEDICAID FL2 LEVEL OF CARE FORM     IDENTIFICATION  Patient Name: [name] Birthdate: [birth year] Sex: male Admission Date (Current Location): 06/13/2024  [facility] and [place] Number:  Producer, television/film/video and Address:  [facility] [place], [address], [place], [place] [address]      Provider Number: [id number]  Attending Physician Name and Address:  Unk Garb, DO  Relative Name and Phone Number:  [place] [facility])  [phone]    Current Level of Care: Hospital Recommended Level of Care: Skilled Nursing Facility Prior Approval Number:    Date Approved/Denied:   PASRR Number: [id number]  Discharge Plan: SNF    Current Diagnoses: Patient Active Problem List   Diagnosis Date Noted   Parainfluenza infection 06/15/2024   Fever 06/14/2024   Lactic acidosis 06/14/2024   Hypomagnesemia 06/14/2024   Normocytic anemia 06/14/2024   GAD (generalized anxiety disorder) 06/14/2024   Urinary incontinence 06/14/2024   Acute metabolic encephalopathy 06/13/2024   Hx of total hip arthroplasty 05/28/2024   Unilateral primary osteoarthritis, left hip 02/25/2024   Essential hypertension 02/07/2022   Osteoarthritis of multiple joints 05/08/2021   PAD (peripheral artery disease) (HCC) 02/13/2021   Benzodiazepine dependence (HCC) 10/16/2018   Controlled substance agreement signed 10/16/2018   BPH (benign prostatic hyperplasia) 07/17/2018   Edema 05/01/2017   Chronic back pain 07/11/2016   Vitamin D  insufficiency 10/13/2013   DOE (dyspnea on exertion) 04/22/2013   Vocal fold paresis, 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 Wound 06/14/24 1134 Other (Comment) Buttocks Right)                       Personal Care Assistance Level of Assistance  Bathing, Feeding, Dressing Bathing Assistance: Limited assistance Feeding assistance: Independent Dressing Assistance: Limited assistance     Functional Limitations Info  Sight, Hearing, Speech Sight Info: Impaired Hearing Info: Adequate Speech Info: Adequate    SPECIAL CARE FACTORS FREQUENCY  PT (By licensed PT), OT (By licensed OT)     PT Frequency: 5x/week OT Frequency: 5x/week            Contractures Contractures Info: Not present    Additional Factors Info  Code Status Code Status Info: FULL Allergies Info: Diclofenac   Flexeril (Cyclobenzaprine)  Guaifenesin Er           Current Medications (06/16/2024):  This is the current hospital active medication list Current Facility-Administered Medications  Medication Dose Route Frequency Provider Last Rate Last Admin   acetaminophen  (TYLENOL ) tablet 650 mg  650 mg Oral Q6H PRN Howerter, Justin B, DO   650 mg at 06/14/24 1043   Or   acetaminophen  (TYLENOL ) suppository 650 mg  650 mg Rectal Q6H PRN Howerter, Justin B, DO       amLODipine  (NORVASC ) tablet 10 mg  10 mg Oral Daily Unk Garb, DO   10 mg at 06/16/24 0272   atorvastatin  (LIPITOR)  tablet 20 mg  20 mg Oral Daily Howerter, Justin B, DO   20 mg at 06/16/24 6295   cyanocobalamin (VITAMIN B12) tablet 1,000 mcg  1,000 mcg Oral Daily Janeane Mealy, MD   1,000 mcg at 06/16/24 2841   DULoxetine  (CYMBALTA ) DR capsule 60 mg  60 mg Oral Daily Janeane Mealy, MD   60 mg at 06/16/24 3244   enoxaparin  (LOVENOX ) injection 40 mg  40 mg Subcutaneous Q24H Janeane Mealy, MD   40 mg at  06/15/24 1625   fentaNYL  (SUBLIMAZE ) injection 25 mcg  25 mcg Intravenous Q2H PRN Howerter, Justin B, DO       finasteride  (PROSCAR ) tablet 5 mg  5 mg Oral QHS Howerter, Justin B, DO   5 mg at 06/15/24 2248   haloperidol lactate (HALDOL) injection 5 mg  5 mg Intravenous Q6H PRN Howerter, Justin B, DO   5 mg at 06/14/24 0030   insulin  aspart (novoLOG ) injection 0-9 Units  0-9 Units Subcutaneous TID WC Howerter, Justin B, DO   1 Units at 06/16/24 1203   latanoprost  (XALATAN ) 0.005 % ophthalmic solution 1 drop  1 drop Both Eyes QHS Wouk, Haynes Lips, MD   1 drop at 06/14/24 2251   LORazepam (ATIVAN) injection 1 mg  1 mg Intravenous BID PRN Howerter, Justin B, DO   1 mg at 06/16/24 0234   losartan  (COZAAR ) tablet 100 mg  100 mg Oral Daily Unk Garb, DO   100 mg at 06/16/24 0102   melatonin tablet 3 mg  3 mg Oral QHS PRN Howerter, Justin B, DO   3 mg at 06/14/24 2250   naloxone (NARCAN) injection 0.4 mg  0.4 mg Intravenous PRN Howerter, Justin B, DO       ondansetron  (ZOFRAN ) injection 4 mg  4 mg Intravenous Q6H PRN Howerter, Justin B, DO       tamsulosin  (FLOMAX ) capsule 0.8 mg  0.8 mg Oral QHS Howerter, Justin B, DO   0.8 mg at 06/15/24 2248     Discharge Medications: Please see discharge summary for a list of discharge medications.  Relevant Imaging Results:  Relevant Lab Results:   Additional Information SSN: [id number]  Amarissa Koerner A [place], LCSW

## 2024-06-16 NOTE — Progress Notes (Addendum)
 This RN has tried to calling facility with the number ([phone])provided at least 5x to give report, no response. 1645 : PATR to facilitate transportation, currently @ bedside, attempted to call [facility] once more, unable to reach anyone.

## 2024-06-16 NOTE — TOC Progression Note (Signed)
 Transition of Care [facility]) - Progression Note    Patient Details  Name: [name] MRN: [id number] Date of Birth: 12/07/1933  [facility]) CM/SW Contact  Amariyon Maynes A [place], LCSW Phone Number: 06/16/2024, 10:15 AM  Clinical Narrative:     CSW was notified via secure chat, pt is from [facility] for short term rehab, medically stable to return. CSW started insurance authorization, status pending.   Siegfried Dress ID [id number] Reference ID [id number]  CSW was notified by Johnnette Nakayama at [facility] that pt can return once authorization is approved.    TOC will continue to follow.         Expected Discharge Plan and Services                                               Social Determinants of Health (SDOH) Interventions SDOH Screenings   Food Insecurity: No Food Insecurity (06/14/2024)  Recent Concern: Food Insecurity - Food Insecurity Present (05/28/2024)  Housing: Low Risk  (06/14/2024)  Recent Concern: Housing - High Risk (05/28/2024)  Transportation Needs: No Transportation Needs (06/14/2024)  Utilities: Not At Risk (06/14/2024)  Alcohol  Screen: Low Risk  (05/27/2024)  Depression (PHQ2-9): Low Risk  (05/27/2024)  Financial Resource Strain: High Risk (05/27/2024)  Physical Activity: Insufficiently Active (05/27/2023)  Social Connections: Patient Declined (06/14/2024)  Recent Concern: Social Connections - Socially Isolated (05/28/2024)  Stress: No Stress Concern Present (05/27/2024)  Tobacco Use: Medium Risk (06/14/2024)  Health Literacy: Adequate Health Literacy (05/27/2024)    Readmission Risk Interventions     No data to display

## 2024-06-16 NOTE — TOC Transition Note (Signed)
 Transition of Care [facility]) - Discharge Note   Patient Details  Name: [name] MRN: [id number] Date of Birth: 1933/05/28  Transition of Care [facility]) CM/SW Contact:  Merida Alcantar A [place], LCSW Phone Number: 06/16/2024, 2:25 PM   Clinical Narrative:     Patient will DC to: [facility]  Anticipated DC date: 06/16/24  Family notified: Image Frosty Jews  Transport by: Dortha Gauss ID [id number] Reference ID [id number]   Approval Dates: 06/16/2024-06/18/2024     Per MD patient ready for DC to [facility]. RN, patient, patient's family, and facility notified of DC. Discharge Summary and FL2 sent to facility. RN to call report prior to discharge [phone]). DC packet on chart. Ambulance transport requested for patient.     CSW will sign off for now as social work intervention is no longer needed. Please consult us  again if new needs arise.   Final next level of care: Skilled Nursing Facility Barriers to Discharge: Barriers Resolved   Patient Goals and CMS Choice            Discharge Placement              Patient chooses bed at: [facility] Patient to be transferred to facility by: [facility] Name of family member notified: Image Frosty Jews Patient and family notified of of transfer: 06/16/24  Discharge Plan and Services Additional resources added to the After Visit Summary for                                       Social Drivers of Health (SDOH) Interventions SDOH Screenings   Food Insecurity: No Food Insecurity (06/14/2024)  Recent Concern: Food Insecurity - Food Insecurity Present (05/28/2024)  Housing: Low Risk  (06/14/2024)  Recent Concern: Housing - High Risk (05/28/2024)  Transportation Needs: No Transportation Needs (06/14/2024)  Utilities: Not At Risk (06/14/2024)  Alcohol  Screen: Low Risk  (05/27/2024)  Depression (PHQ2-9): Low Risk  (05/27/2024)  Financial Resource Strain: High Risk (05/27/2024)  Physical Activity: Insufficiently Active (05/27/2023)  Social  Connections: Patient Declined (06/14/2024)  Recent Concern: Social Connections - Socially Isolated (05/28/2024)  Stress: No Stress Concern Present (05/27/2024)  Tobacco Use: Medium Risk (06/14/2024)  Health Literacy: Adequate Health Literacy (05/27/2024)     Readmission Risk Interventions     No data to display

## 2024-06-16 NOTE — Discharge Summary (Signed)
 [facility] Discharge Summary   Patient name: [name]  Admit date:     06/13/2024  Discharge date: 06/16/2024  Attending Physician: HOWERTER, JUSTIN B [[id number]]  Discharge Physician: Unk Garb   PCP: Dale Hem, FNP  Admitted From: SNF [name] Disposition:  [name]  SNF  Recommendations for Outpatient Follow-up:  Follow up with PCP in 1-2 weeks Follow up with DR. Chris [name](orthopedics) in 1-2 weeks. Call for appointment.  Home Health:No Equipment/Devices: None    Discharge Condition:Stable CODE STATUS:FULL Diet recommendation: Heart Healthy/Diabetic Fluid Restriction: None  Hospital Summary: HPI: [name] is a 89 y.o. male with medical history significant for type 2 diabetes mellitus, left total hip arthroplasty on 05/28/2024, GAD, BPH, essential pretension, hyperlipidemia, who is admitted to [facility] on 06/13/2024 with acute encephalopathy after presenting from SNF to [facility] ED for evaluation of altered mental status.    In the setting of the patient's altered mental status, the following history was provided by the SNF staff, my discussions with the EDP, and via chart review.   In the setting of osteoarthritis of the left hip, he recently underwent elective left total hip arthroplasty with Dr. Lucienne [name] of [facility] on 05/28/2024.  On 05/31/2024, he was discharged to SNF for temporary rehab.  Over the last 1 to 2 days, SNF staff has noted the patient to be confused relative to his baseline mental status, ultimately resulting in his presentation to [facility] emergency department this evening.  Patient has reported some ongoing left hip discomfort, but was otherwise without acute complaint leading up to progressive agitation in the ED. EDP also conveys the patient had denied any acute respiratory symptoms.    Following discharge from [facility] on 05/31/[facility] to SNF, the patient has been prn oxycodone  for residual left hip  discomfort.  His outpatient medications are also notable for scheduled baclofen  on a 3 times daily basis, as needed Valium  for history of generalized anxiety disorder, scheduled gabapentin .  He also has a history of BPH as well as urinary incontinence and is on scheduled oxybutynin  for the latter.   Per chart review, his baseline hemoglobin ranges 13.5-15.  His most recent preoperative hemoglobin was found to be 13.9 on 05/14/2024, while his most recent prior hemoglobin, which occurred on postop day 2 status post left total hip arthroplasty, was found to be 10 when checked on 05/30/2024.  Following his left total hip arthroplasty on 05/28/2024, he has been on aspirin  81 mg p.o. twice daily.    Per chart review, most recent echocardiogram occurred in February 2013, at which time 2D echo showed LVEF 60 to 65% as well as no evidence of focal wall motion abnormalities.  Subsequently, the patient was admitted for further evaluation management of presenting acute encephalopathy, of currently unclear source, with presentation also notable for initial objective fever, lactic acidosis, hypomagnesemia, and subacute normocytic anemia.   Significant Events: Admitted 06/13/2024 for acute encephalopathy   Admission Labs: Lactic acid 2.8 WBC 8.4, HgB 10, plt 582 Na 139, K 3.5, CO2 of 23, BUN 15, Scr 1.11, glu 155 UA moderate HgB, RBC 11-20, negative nitrite, negative LE Covid/rsv/flu negative TSH 2.160 VBG pH 7.44, PCO2 33 CK 95 Fe 32, TIBC 237, %sat 14 Procal Negative RVP Positive for parainfluenza   Admission Imaging Studies: CXR No active disease   Significant Labs:   Significant Imaging Studies:   Antibiotic Therapy: Anti-infectives (From admission, onward)    Start     Dose/Rate Route Frequency  Ordered Stop   06/13/24 2045  cefTRIAXone (ROCEPHIN) 2 g in sodium chloride  0.9 % 100 mL IVPB        2 g 200 mL/hr over 30 Minutes Intravenous Once 06/13/24 2042 06/13/24 2211   06/13/24 2045   azithromycin (ZITHROMAX) 500 mg in sodium chloride  0.9 % 250 mL IVPB        500 mg 250 mL/hr over 60 Minutes Intravenous  Once 06/13/24 2042 06/13/24 2345       Procedures:   Consultants:    Hospital Course by Problem: * Acute metabolic encephalopathy 06-15-2024 likely due to parainfluenza infection. Pt is now awake and alert. Acute metabolic encephalopathy resolved.  06-16-2024 resolved on 06-15-2024.  Parainfluenza infection 06-15-2024 RVP positive for parainfluenza. No specific anti-virals to treat with. Only supportive care. Pt on RA. Afebrile. WBC 7.7  06-16-2024 stable. On RA. No fevers. Continue supportive care.  Hypomagnesemia 06-15-2024 treated with IV Mg.  06-16-2024 stable.  Lactic acidosis 06-15-2024 resolved. With IVF.  Fever 06-15-2024 RVP positive for parainfluenza. No specific anti-virals to treat with. Only supportive care. Pt on RA. Afebrile. WBC 7.7  06-16-2024 resolved.  Urinary incontinence 06-15-2024 stable.  06-16-2024 stable.  Essential hypertension 06-15-2024 stable. Restart his ARB and norvasc .  06-16-2024 BP stable after restarting BP meds.  GAD (generalized anxiety disorder) 06-15-2024 stable.  06-16-2024 stable.  Normocytic anemia 06-15-2024 stable.  06-16-2024 stable.  Hx of total hip arthroplasty 06-15-2024 stable. CT hip negative for abscess, no peri-prosthetic fracture.  06-16-2024 stable. Pt able to walk 60 feet today. Needs continued PT at SNF.  BPH (benign prostatic hyperplasia) 06-15-2024 stable.  06-16-2024 continue flomax  and proscar .  HLD (hyperlipidemia) 06-15-2024 stable. On lipitor.  06-16-2024   DM2 (diabetes mellitus, type 2) (HCC) 06-15-2024 on SSI. Holding metformin .   06-16-2024 CBG stable and controlled.    Discharge Diagnoses:  Principal Problem:   Acute metabolic encephalopathy Active Problems:   Parainfluenza infection   Fever   Lactic acidosis   Hypomagnesemia   DM2 (diabetes mellitus,  type 2) (HCC)   HLD (hyperlipidemia)   BPH (benign prostatic hyperplasia)   Hx of total hip arthroplasty   Normocytic anemia   GAD (generalized anxiety disorder)   Essential hypertension   Urinary incontinence   Discharge Instructions  Discharge Instructions     Call MD for:  difficulty breathing, headache or visual disturbances   Complete by: As directed    Call MD for:  extreme fatigue   Complete by: As directed    Call MD for:  hives   Complete by: As directed    Call MD for:  persistant dizziness or light-headedness   Complete by: As directed    Call MD for:  persistant nausea and vomiting   Complete by: As directed    Call MD for:  redness, tenderness, or signs of infection (pain, swelling, redness, odor or green/yellow discharge around incision site)   Complete by: As directed    Call MD for:  severe uncontrolled pain   Complete by: As directed    Call MD for:  temperature >100.4   Complete by: As directed    Diet - low sodium heart healthy   Complete by: As directed    Diet Carb Modified   Complete by: As directed    Discharge instructions   Complete by: As directed    1. Follow up with your primary care provider in 1-2 weeks following discharge from hospital. 2. Follow up with Dr. Albina Hull with orthopedics  in 1-2 weeks.   Increase activity slowly   Complete by: As directed    Leave dressing on - Keep it clean, dry, and intact until clinic visit   Complete by: As directed       Allergies as of 06/16/2024       Reactions   Diclofenac     Flexeril [cyclobenzaprine]    Guaifenesin Er    Believes that it makes him black out        Medication List     STOP taking these medications    gabapentin  300 MG capsule Commonly known as: NEURONTIN    oxyCODONE  5 MG immediate release tablet Commonly known as: Oxy IR/ROXICODONE        TAKE these medications    acetaminophen  650 MG CR tablet Commonly known as: TYLENOL  Take 650 mg by mouth every 8  (eight) hours as needed for pain.   amLODipine  10 MG tablet Commonly known as: NORVASC  Take 1 tablet (10 mg total) by mouth daily.   aspirin  81 MG chewable tablet Chew 1 tablet (81 mg total) by mouth 2 (two) times daily.   atorvastatin  20 MG tablet Commonly known as: LIPITOR Take 1 tablet (20 mg total) by mouth daily. What changed: when to take this   Baclofen  5 MG Tabs Take 5 mg by mouth 3 (three) times daily.   calcium  carbonate 500 MG chewable tablet Commonly known as: TUMS - dosed in mg elemental calcium  Chew 1 tablet by mouth daily.   cyanocobalamin 1000 MCG tablet Take 1 tablet (1,000 mcg total) by mouth daily. Start taking on: June 17, 2024   diazepam  5 MG tablet Commonly known as: VALIUM  Take 1 tablet (5 mg total) by mouth 2 (two) times daily as needed for anxiety.   DULoxetine  60 MG capsule Commonly known as: Cymbalta  Take 1 capsule (60 mg total) by mouth daily.   feeding supplement (PRO-STAT 64) Liqd Take 30 mLs by mouth daily.   ferrous gluconate 324 MG tablet Commonly known as: FERGON Take 324 mg by mouth daily with breakfast.   finasteride  5 MG tablet Commonly known as: PROSCAR  Take 1 tablet (5 mg total) by mouth at bedtime. For prostate   latanoprost  0.005 % ophthalmic solution Commonly known as: XALATAN  Place 1 drop into both eyes at bedtime.   lidocaine 4 % Place 1 patch onto the skin daily.   losartan  100 MG tablet Commonly known as: COZAAR  Take 1 tablet (100 mg total) by mouth daily.   metFORMIN  500 MG 24 hr tablet Commonly known as: GLUCOPHAGE -XR TAKE 1 TABLET BY MOUTH 3 TIMES DAILY WITH MEALS. What changed:  how much to take how to take this when to take this additional instructions   oxybutynin  5 MG 24 hr tablet Commonly known as: DITROPAN -XL Take 1 tablet (5 mg total) by mouth at bedtime.   PAIN RELIEF ROLL-ON EX Apply 1 application  topically daily as needed (pain).   potassium chloride 10 MEQ tablet Commonly known as:  KLOR-CON Take 10 mEq by mouth every other day.   Systane 0.4-0.3 % Soln Generic drug: Polyethyl Glycol-Propyl Glycol Place 1 drop into both eyes daily as needed (Dry eye).   tamsulosin  0.4 MG Caps capsule Commonly known as: FLOMAX  TAKE 2 CAPSULES (0.8 MG TOTAL) BY MOUTH AT BEDTIME. FOR PROSTATE. What changed: how much to take   torsemide  20 MG tablet Commonly known as: DEMADEX  Take 1 tablet (20 mg total) by mouth daily as needed (lower leg swelling). Leg swelling, take 1 of  these per day on top of your normal fluid pills medicines for 5 days What changed:  when to take this reasons to take this   Toujeo  Max SoloStar 300 UNIT/ML Solostar Pen Generic drug: insulin  glargine (2 Unit Dial ) Inject 5 Units into the skin at bedtime. What changed: how much to take   urea 40 % Crea Commonly known as: CARMOL Apply 1 Application topically daily. Apply to bilateral toe calluses after cleansing toes with soap and water    Vitamin D  (Ergocalciferol ) 1.25 MG (50000 UNIT) Caps capsule Commonly known as: DRISDOL  TAKE 1 CAPSULE BY MOUTH ON SUNDAY OF EACH WEEK AS DIRECTED               Discharge Care Instructions  (From admission, onward)           Start     Ordered   06/16/24 0000  Leave dressing on - Keep it clean, dry, and intact until clinic visit        06 /18/25 1358            Follow-up Information     Arnie Lao, MD. Schedule an appointment as soon as possible for a visit in 1 week(s).   Specialty: Orthopedic Surgery Contact information: [address] [place]  [address] [place] [address] [phone]                Allergies  Allergen Reactions   Diclofenac     Flexeril [Cyclobenzaprine]    Guaifenesin Er     Believes that it makes him black out    Discharge Exam: Vitals:   06/16/24 0608 06/16/24 0830  BP: (!) 144/64 (!) 152/76  Pulse: 76 86  Resp: 18 18  Temp: 98.3 F (36.8 C)   SpO2: 98% 98%    Physical Exam Vitals and nursing note  reviewed.  Constitutional:      General: He is not in acute distress.    Appearance: He is normal weight. He is not toxic-appearing or diaphoretic.  HENT:     Head: Normocephalic and atraumatic.     Nose: Nose normal.  Pulmonary:     Effort: No respiratory distress.  Abdominal:     General: There is no distension.   Neurological:     Mental Status: He is alert and oriented to person, place, and time.     Comments: Walking with walker and mobility tech    The results of significant diagnostics from this hospitalization (including imaging, microbiology, ancillary and laboratory) are listed below for reference.    Microbiology: Resp panel by RT-PCR (RSV, Flu A&B, Covid) Anterior Nasal Swab     Status: None   Collection Time: 06/13/24  9:55 PM   Specimen: Anterior Nasal Swab  Result Value Ref Range Status   SARS Coronavirus 2 by RT PCR NEGATIVE NEGATIVE Final   Influenza A by PCR NEGATIVE NEGATIVE Final   Influenza B by PCR NEGATIVE NEGATIVE Final    Comment: (NOTE) The Xpert Xpress SARS-CoV-2/FLU/RSV plus assay is intended as an aid in the diagnosis of influenza from Nasopharyngeal swab specimens and should not be used as a sole basis for treatment. Nasal washings and aspirates are unacceptable for Xpert Xpress SARS-CoV-2/FLU/RSV testing.  Fact Sheet for Patients: BloggerCourse.com  Fact Sheet for Healthcare Providers: SeriousBroker.it  This test is not yet approved or cleared by the [place]  FDA and has been authorized for detection and/or diagnosis of SARS-CoV-2 by FDA under an Emergency Use Authorization (EUA). This EUA will remain in effect (meaning this  test can be used) for the duration of the COVID-19 declaration under Section 564(b)(1) of the Act, 21 U.S.C. section 360bbb-3(b)(1), unless the authorization is terminated or revoked.     Resp Syncytial Virus by PCR NEGATIVE NEGATIVE Final    Comment:  (NOTE) Fact Sheet for Patients: BloggerCourse.com  Fact Sheet for Healthcare Providers: SeriousBroker.it  This test is not yet approved or cleared by the [place]  FDA and has been authorized for detection and/or diagnosis of SARS-CoV-2 by FDA under an Emergency Use Authorization (EUA). This EUA will remain in effect (meaning this test can be used) for the duration of the COVID-19 declaration under Section 564(b)(1) of the Act, 21 U.S.C. section 360bbb-3(b)(1), unless the authorization is terminated or revoked.  Performed at [facility], [address], [place], [place] [address]   Respiratory (~20 pathogens) panel by PCR     Status: Abnormal   Collection Time: 06/14/24  4:20 PM   Specimen: Nasopharyngeal Swab; Respiratory  Result Value Ref Range Status   Adenovirus NOT DETECTED NOT DETECTED Final   Coronavirus 229E NOT DETECTED NOT DETECTED Final    Comment: (NOTE) The Coronavirus on the Respiratory Panel, DOES NOT test for the novel  Coronavirus (2019 nCoV)    Coronavirus HKU1 NOT DETECTED NOT DETECTED Final   Coronavirus NL63 NOT DETECTED NOT DETECTED Final   Coronavirus OC43 NOT DETECTED NOT DETECTED Final   Metapneumovirus NOT DETECTED NOT DETECTED Final   Rhinovirus / Enterovirus NOT DETECTED NOT DETECTED Final   Influenza A NOT DETECTED NOT DETECTED Final   Influenza B NOT DETECTED NOT DETECTED Final   Parainfluenza Virus 1 NOT DETECTED NOT DETECTED Final   Parainfluenza Virus 2 NOT DETECTED NOT DETECTED Final   Parainfluenza Virus 3 DETECTED (A) NOT DETECTED Final   Parainfluenza Virus 4 NOT DETECTED NOT DETECTED Final   Respiratory Syncytial Virus NOT DETECTED NOT DETECTED Final   Bordetella pertussis NOT DETECTED NOT DETECTED Final   Bordetella Parapertussis NOT DETECTED NOT DETECTED Final   Chlamydophila pneumoniae NOT DETECTED NOT DETECTED Final   Mycoplasma pneumoniae NOT DETECTED NOT DETECTED Final     Comment: Performed at [facility], [address] [place], [place] [address]     Labs: BNP (last 3 results) Recent Labs    05/05/24 1008  BNP 31.5   Basic Metabolic Panel: Recent Labs  Lab 06/13/24 2104 06/13/24 2112 06/14/24 0836 06/14/24 1215  NA 139  --  139  --   K 3.5  --  3.0*  --   CL 103  --  107  --   CO2 23  --  21*  --   GLUCOSE 155*  --  110*  --   BUN 15  --  9  --   CREATININE 1.11  --  0.73  --   CALCIUM  9.2  --  8.1*  --   MG  --  1.5* 8.9* 2.1   Liver Function Tests: Recent Labs  Lab 06/13/24 2104 06/14/24 0836  AST 22 17  ALT 14 11  ALKPHOS 74 57  BILITOT 0.7 0.5  PROT 6.4* 5.6*  ALBUMIN 3.0* 2.6*   CBC: Recent Labs  Lab 06/13/24 2104 06/14/24 0836 06/14/24 1215  WBC 8.4 7.7  --   NEUTROABS 6.4 6.1  --   HGB 10.0* 9.2* 10.1*  HCT 30.0* 28.6* 30.1*  MCV 90.1 93.5  --   PLT 582* 486*  --    Cardiac Enzymes: Recent Labs  Lab  06/14/24 0836  CKTOTAL 95   CBG: Recent Labs  Lab 06/15/24 1609 06/15/24 2118 06/16/24 0746 06/16/24 0828 06/16/24 1121  GLUCAP 117* 155* 130* 151* 146*   D-Dimer Recent Labs    06/14/24 2037  DDIMER 7.99*   Thyroid  function studies Recent Labs    06/13/24 2319  TSH 2.160   Anemia work up Recent Labs    06/13/24 2319 06/14/24 0836 06/14/24 1215  VITAMINB12  --   --  178*  FOLATE 8.9  --   --   FERRITIN  --  186  --   TIBC  --  237*  --   IRON  --  32*  --    Urinalysis    Component Value Date/Time   COLORURINE YELLOW 06/13/2024 2155   APPEARANCEUR CLEAR 06/13/2024 2155   APPEARANCEUR Clear 05/13/2022 1102   LABSPEC 1.013 06/13/2024 2155   PHURINE 6.0 06/13/2024 2155   GLUCOSEU NEGATIVE 06/13/2024 2155   HGBUR MODERATE (A) 06/13/2024 2155   BILIRUBINUR NEGATIVE 06/13/2024 2155   BILIRUBINUR Negative 05/13/2022 1102   KETONESUR NEGATIVE 06/13/2024 2155   PROTEINUR NEGATIVE 06/13/2024 2155   UROBILINOGEN 1.0 02/26/2012 2146   NITRITE NEGATIVE 06/13/2024 2155    LEUKOCYTESUR NEGATIVE 06/13/2024 2155   Sepsis Labs Recent Labs  Lab 06/13/24 2104 06/14/24 0836  PROCALCITON  --  <0.10  WBC 8.4 7.7    Procedures/Studies: CT HIP LEFT WO CONTRAST Result Date: 06/15/2024 CLINICAL DATA:  Fever status post recent left total hip replacement. EXAM: CT OF THE LEFT HIP WITHOUT CONTRAST TECHNIQUE: Multidetector CT imaging of the left hip was performed according to the standard protocol. Multiplanar CT image reconstructions were also generated. RADIATION DOSE REDUCTION: This exam was performed according to the departmental dose-optimization program which includes automated exposure control, adjustment of the mA and/or kV according to patient size and/or use of iterative reconstruction technique. COMPARISON:  Radiographs 05/28/2024 and 01/13/2023. FINDINGS: Bones/Joint/Cartilage Status post recent left total hip arthroplasty. The hardware is intact. No evidence of acute fracture, dislocation or hardware loosening. Left sacroiliac degenerative changes with anterior bridging osteophytes noted. Ligaments Suboptimally assessed by CT. Muscles and Tendons Expected postsurgical changes within the left hip anterior and lateral musculature. No organized intramuscular fluid collection identified. There is mild generalized atrophy. No acute tendon findings identified. There are calcifications within the common hamstring tendon. Soft tissues Left hip anterior skin staples remain in place. There is an underlying lenticular shaped air-collection in the subcutaneous fat which measures up to 6.0 x 2.9 cm transverse and extends approximately 9.6 cm in length. There is soft tissue stranding in the surrounding subcutaneous fat without other focal fluid collection. No inflammation identified in the visualized perineum. Incidentally noted is at least moderate enlargement of the prostate gland with probable protrusion into the bladder base. IMPRESSION: 1. Status post recent left total hip  arthroplasty with intact hardware. 2. Lenticular shaped air-collection in the left hip anterior subcutaneous fat, likely postsurgical. Correlate clinically to exclude superficial soft tissue infection. No other focal fluid collection identified. 3. Expected postsurgical changes within the left hip anterior and lateral musculature. 4. Incidentally noted at least moderate enlargement of the prostate gland with probable protrusion into the bladder base. Electronically Signed   By: Elmon Hagedorn M.D.   On: 06/15/2024 08:52   DG Chest Port 1 View Result Date: 06/13/2024 CLINICAL DATA:  Sepsis, altered level of consciousness EXAM: PORTABLE CHEST 1 VIEW COMPARISON:  05/14/2023 FINDINGS: The heart size and mediastinal contours are within normal limits.  Both lungs are clear. The visualized skeletal structures are unremarkable. IMPRESSION: No active disease. Electronically Signed   By: Bobbye Burrow M.D.   On: 06/13/2024 21:27   DG Pelvis Portable Result Date: 05/28/2024 CLINICAL DATA:  Status post left hip replacement. EXAM: PORTABLE PELVIS 1-2 VIEWS COMPARISON:  None Available. FINDINGS: Left hip arthroplasty in expected alignment. No periprosthetic lucency or fracture. Recent postsurgical change includes air and edema in the soft tissues. Lateral skin staples in place. IMPRESSION: Left hip arthroplasty without immediate postoperative complication. Electronically Signed   By: Chadwick Colonel M.D.   On: 05/28/2024 16:19   DG HIP UNILAT WITH PELVIS 1V LEFT Result Date: 05/28/2024 CLINICAL DATA:  Elective surgery. EXAM: DG HIP (WITH OR WITHOUT PELVIS) 1V*L* COMPARISON:  None Available. FINDINGS: Three fluoroscopic spot views of the pelvis and left hip obtained in the operating room. Images during hip arthroplasty. Fluoroscopy time 20 seconds. Dose 2.5 mGy. IMPRESSION: Intraoperative fluoroscopy during left hip arthroplasty. Electronically Signed   By: Chadwick Colonel M.D.   On: 05/28/2024 16:19   DG C-Arm 1-60  Min-No Report Result Date: 05/28/2024 Fluoroscopy was utilized by the requesting physician.  No radiographic interpretation.   DG C-Arm 1-60 Min-No Report Result Date: 05/28/2024 Fluoroscopy was utilized by the requesting physician.  No radiographic interpretation.    Time coordinating discharge: 55 mins  SIGNED:  Unk Garb, DO [facility] 06/16/24, 1:59 PM

## 2024-06-18 DIAGNOSIS — G9340 Encephalopathy, unspecified: Secondary | ICD-10-CM | POA: Diagnosis not present

## 2024-06-18 DIAGNOSIS — Z7984 Long term (current) use of oral hypoglycemic drugs: Secondary | ICD-10-CM | POA: Diagnosis not present

## 2024-06-18 DIAGNOSIS — H409 Unspecified glaucoma: Secondary | ICD-10-CM | POA: Diagnosis not present

## 2024-06-18 DIAGNOSIS — I10 Essential (primary) hypertension: Secondary | ICD-10-CM | POA: Diagnosis not present

## 2024-06-18 DIAGNOSIS — E785 Hyperlipidemia, unspecified: Secondary | ICD-10-CM | POA: Diagnosis not present

## 2024-06-18 DIAGNOSIS — K219 Gastro-esophageal reflux disease without esophagitis: Secondary | ICD-10-CM | POA: Diagnosis not present

## 2024-06-18 DIAGNOSIS — Z9189 Other specified personal risk factors, not elsewhere classified: Secondary | ICD-10-CM | POA: Diagnosis not present

## 2024-06-18 DIAGNOSIS — Z794 Long term (current) use of insulin: Secondary | ICD-10-CM | POA: Diagnosis not present

## 2024-06-18 DIAGNOSIS — M1612 Unilateral primary osteoarthritis, left hip: Secondary | ICD-10-CM | POA: Diagnosis not present

## 2024-06-18 LAB — CULTURE, BLOOD (ROUTINE X 2)
Culture: NO GROWTH
Special Requests: ADEQUATE

## 2024-07-01 DIAGNOSIS — Z7984 Long term (current) use of oral hypoglycemic drugs: Secondary | ICD-10-CM | POA: Diagnosis not present

## 2024-07-01 DIAGNOSIS — I10 Essential (primary) hypertension: Secondary | ICD-10-CM | POA: Diagnosis not present

## 2024-07-01 DIAGNOSIS — E1151 Type 2 diabetes mellitus with diabetic peripheral angiopathy without gangrene: Secondary | ICD-10-CM | POA: Diagnosis not present

## 2024-07-01 DIAGNOSIS — D509 Iron deficiency anemia, unspecified: Secondary | ICD-10-CM | POA: Diagnosis not present

## 2024-07-01 DIAGNOSIS — M1612 Unilateral primary osteoarthritis, left hip: Secondary | ICD-10-CM | POA: Diagnosis not present

## 2024-07-01 DIAGNOSIS — E876 Hypokalemia: Secondary | ICD-10-CM | POA: Diagnosis not present

## 2024-07-06 DIAGNOSIS — J45909 Unspecified asthma, uncomplicated: Secondary | ICD-10-CM | POA: Diagnosis not present

## 2024-07-06 DIAGNOSIS — I10 Essential (primary) hypertension: Secondary | ICD-10-CM | POA: Diagnosis not present

## 2024-07-06 DIAGNOSIS — L89626 Pressure-induced deep tissue damage of left heel: Secondary | ICD-10-CM | POA: Diagnosis not present

## 2024-07-06 DIAGNOSIS — H9193 Unspecified hearing loss, bilateral: Secondary | ICD-10-CM | POA: Diagnosis not present

## 2024-07-06 DIAGNOSIS — G8929 Other chronic pain: Secondary | ICD-10-CM | POA: Diagnosis not present

## 2024-07-06 DIAGNOSIS — Z96642 Presence of left artificial hip joint: Secondary | ICD-10-CM | POA: Diagnosis not present

## 2024-07-06 DIAGNOSIS — Z471 Aftercare following joint replacement surgery: Secondary | ICD-10-CM | POA: Diagnosis not present

## 2024-07-06 DIAGNOSIS — Z7984 Long term (current) use of oral hypoglycemic drugs: Secondary | ICD-10-CM | POA: Diagnosis not present

## 2024-07-06 DIAGNOSIS — M545 Low back pain, unspecified: Secondary | ICD-10-CM | POA: Diagnosis not present

## 2024-07-06 DIAGNOSIS — K2970 Gastritis, unspecified, without bleeding: Secondary | ICD-10-CM | POA: Diagnosis not present

## 2024-07-06 DIAGNOSIS — E876 Hypokalemia: Secondary | ICD-10-CM | POA: Diagnosis not present

## 2024-07-06 DIAGNOSIS — E785 Hyperlipidemia, unspecified: Secondary | ICD-10-CM | POA: Diagnosis not present

## 2024-07-06 DIAGNOSIS — H4020X0 Unspecified primary angle-closure glaucoma, stage unspecified: Secondary | ICD-10-CM | POA: Diagnosis not present

## 2024-07-06 DIAGNOSIS — D649 Anemia, unspecified: Secondary | ICD-10-CM | POA: Diagnosis not present

## 2024-07-06 DIAGNOSIS — Z604 Social exclusion and rejection: Secondary | ICD-10-CM | POA: Diagnosis not present

## 2024-07-06 DIAGNOSIS — E1151 Type 2 diabetes mellitus with diabetic peripheral angiopathy without gangrene: Secondary | ICD-10-CM | POA: Diagnosis not present

## 2024-07-06 DIAGNOSIS — L84 Corns and callosities: Secondary | ICD-10-CM | POA: Diagnosis not present

## 2024-07-06 DIAGNOSIS — Z7982 Long term (current) use of aspirin: Secondary | ICD-10-CM | POA: Diagnosis not present

## 2024-07-06 DIAGNOSIS — K2100 Gastro-esophageal reflux disease with esophagitis, without bleeding: Secondary | ICD-10-CM | POA: Diagnosis not present

## 2024-07-06 DIAGNOSIS — E1136 Type 2 diabetes mellitus with diabetic cataract: Secondary | ICD-10-CM | POA: Diagnosis not present

## 2024-07-07 ENCOUNTER — Encounter: Payer: Self-pay | Admitting: Physician Assistant

## 2024-07-07 ENCOUNTER — Other Ambulatory Visit: Payer: Self-pay | Admitting: Family

## 2024-07-07 ENCOUNTER — Ambulatory Visit (INDEPENDENT_AMBULATORY_CARE_PROVIDER_SITE_OTHER): Admitting: Physician Assistant

## 2024-07-07 DIAGNOSIS — N401 Enlarged prostate with lower urinary tract symptoms: Secondary | ICD-10-CM

## 2024-07-07 DIAGNOSIS — Z96642 Presence of left artificial hip joint: Secondary | ICD-10-CM

## 2024-07-07 NOTE — Progress Notes (Signed)
 HPI: [name] comes in today status post left total hip arthroplasty 05/28/2024.  His postoperative recovery was complicated by encephalopathy of unknown origin.  However pain medications and muscles relaxants were discontinued.  Patient's since then been at a rehab center and was discharged this past Saturday.  He is back at home.  He does state that Occupational Therapy came out and assessed him yesterday and formal therapy is due to see him in the coming days.  He ranks his hip pain to be 8 out of of 10 pain worse.  He is ambulating with a walker.  He feels like he is slowly trending towards improvement.  He does have follow-up with his primary care physician.  He has on aspirin  once daily than it was on preop.  He has taken Tylenol  at this point in time for pain.  Review of systems: See HPI otherwise negative  Physical exam: General Well-developed well-nourished male who walks with a slow antalgic gait with the use of walker.  Left lower extremity: Surgical incisions healing well no signs of infection or dehiscence.  Staples harvested Steri-Strips applied.  Fluid motion with internal/external rotation.  He has old brawny changes bilateral lower extremities.  Calves are supple nontender.  Heel ulcer lateral aspect of the calcaneus, stage I.  Otherwise no skin breakdown lower extremities.   Impression: Status post left total hip arthroplasty 05/28/2024 Postop encephalopathy  Plan: He can get the incision wet.  He will work with OT PT to gain mobility.  He will also was covered with a large bandage today.  He does have follow-up with podiatrist in the coming days.  Follow-up with his primary care physician.  Continue Tylenol  for pain.  Follow-up with us  in just 2 weeks to check on his overal recovery.  Questions were encouraged and answered at length.

## 2024-07-10 DIAGNOSIS — H4020X0 Unspecified primary angle-closure glaucoma, stage unspecified: Secondary | ICD-10-CM | POA: Diagnosis not present

## 2024-07-10 DIAGNOSIS — Z7984 Long term (current) use of oral hypoglycemic drugs: Secondary | ICD-10-CM | POA: Diagnosis not present

## 2024-07-10 DIAGNOSIS — M545 Low back pain, unspecified: Secondary | ICD-10-CM | POA: Diagnosis not present

## 2024-07-10 DIAGNOSIS — I10 Essential (primary) hypertension: Secondary | ICD-10-CM | POA: Diagnosis not present

## 2024-07-10 DIAGNOSIS — J45909 Unspecified asthma, uncomplicated: Secondary | ICD-10-CM | POA: Diagnosis not present

## 2024-07-10 DIAGNOSIS — E1136 Type 2 diabetes mellitus with diabetic cataract: Secondary | ICD-10-CM | POA: Diagnosis not present

## 2024-07-10 DIAGNOSIS — E1151 Type 2 diabetes mellitus with diabetic peripheral angiopathy without gangrene: Secondary | ICD-10-CM | POA: Diagnosis not present

## 2024-07-10 DIAGNOSIS — K2970 Gastritis, unspecified, without bleeding: Secondary | ICD-10-CM | POA: Diagnosis not present

## 2024-07-10 DIAGNOSIS — L89626 Pressure-induced deep tissue damage of left heel: Secondary | ICD-10-CM | POA: Diagnosis not present

## 2024-07-10 DIAGNOSIS — D649 Anemia, unspecified: Secondary | ICD-10-CM | POA: Diagnosis not present

## 2024-07-10 DIAGNOSIS — E785 Hyperlipidemia, unspecified: Secondary | ICD-10-CM | POA: Diagnosis not present

## 2024-07-10 DIAGNOSIS — Z7982 Long term (current) use of aspirin: Secondary | ICD-10-CM | POA: Diagnosis not present

## 2024-07-10 DIAGNOSIS — L84 Corns and callosities: Secondary | ICD-10-CM | POA: Diagnosis not present

## 2024-07-10 DIAGNOSIS — H9193 Unspecified hearing loss, bilateral: Secondary | ICD-10-CM | POA: Diagnosis not present

## 2024-07-10 DIAGNOSIS — G8929 Other chronic pain: Secondary | ICD-10-CM | POA: Diagnosis not present

## 2024-07-10 DIAGNOSIS — K2100 Gastro-esophageal reflux disease with esophagitis, without bleeding: Secondary | ICD-10-CM | POA: Diagnosis not present

## 2024-07-10 DIAGNOSIS — Z96642 Presence of left artificial hip joint: Secondary | ICD-10-CM | POA: Diagnosis not present

## 2024-07-10 DIAGNOSIS — Z604 Social exclusion and rejection: Secondary | ICD-10-CM | POA: Diagnosis not present

## 2024-07-10 DIAGNOSIS — Z471 Aftercare following joint replacement surgery: Secondary | ICD-10-CM | POA: Diagnosis not present

## 2024-07-10 DIAGNOSIS — E876 Hypokalemia: Secondary | ICD-10-CM | POA: Diagnosis not present

## 2024-07-12 ENCOUNTER — Ambulatory Visit: Admitting: Family Medicine

## 2024-07-12 ENCOUNTER — Other Ambulatory Visit: Payer: Self-pay | Admitting: Family

## 2024-07-12 ENCOUNTER — Encounter: Payer: Self-pay | Admitting: Family Medicine

## 2024-07-12 ENCOUNTER — Telehealth: Payer: Self-pay | Admitting: Family Medicine

## 2024-07-12 VITALS — BP 145/61 | HR 90 | Ht 70.0 in | Wt 189.0 lb

## 2024-07-12 DIAGNOSIS — E119 Type 2 diabetes mellitus without complications: Secondary | ICD-10-CM | POA: Diagnosis not present

## 2024-07-12 DIAGNOSIS — E559 Vitamin D deficiency, unspecified: Secondary | ICD-10-CM

## 2024-07-12 DIAGNOSIS — R0609 Other forms of dyspnea: Secondary | ICD-10-CM | POA: Diagnosis not present

## 2024-07-12 DIAGNOSIS — R7989 Other specified abnormal findings of blood chemistry: Secondary | ICD-10-CM | POA: Diagnosis not present

## 2024-07-12 DIAGNOSIS — Z794 Long term (current) use of insulin: Secondary | ICD-10-CM

## 2024-07-12 DIAGNOSIS — M159 Polyosteoarthritis, unspecified: Secondary | ICD-10-CM | POA: Diagnosis not present

## 2024-07-12 DIAGNOSIS — E782 Mixed hyperlipidemia: Secondary | ICD-10-CM

## 2024-07-12 DIAGNOSIS — I10 Essential (primary) hypertension: Secondary | ICD-10-CM | POA: Diagnosis not present

## 2024-07-12 LAB — BAYER DCA HB A1C WAIVED: HB A1C (BAYER DCA - WAIVED): 5.6 % (ref 4.8–5.6)

## 2024-07-12 NOTE — Telephone Encounter (Signed)
 Copied from CRM [id number]. Topic: Clinical - Home Health Verbal Orders >> Jul 12, 2024  9:15 AM Diannia DEL wrote: Caller/Agency: Debbie/ [facility] Callback Number: [phone] Service Requested: Physical Therapy Frequency: 1 a week for one week, twice a week for 2 weeks Any new concerns about the patient? No  Patient is needing more visits but patient is only wanting this for now

## 2024-07-12 NOTE — Telephone Encounter (Signed)
 Called and left detailed message.
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
## 2024-07-13 DIAGNOSIS — B351 Tinea unguium: Secondary | ICD-10-CM | POA: Diagnosis not present

## 2024-07-13 DIAGNOSIS — E1142 Type 2 diabetes mellitus with diabetic polyneuropathy: Secondary | ICD-10-CM | POA: Diagnosis not present

## 2024-07-13 DIAGNOSIS — M79674 Pain in right toe(s): Secondary | ICD-10-CM | POA: Diagnosis not present

## 2024-07-13 DIAGNOSIS — L84 Corns and callosities: Secondary | ICD-10-CM | POA: Diagnosis not present

## 2024-07-13 DIAGNOSIS — M79675 Pain in left toe(s): Secondary | ICD-10-CM | POA: Diagnosis not present

## 2024-07-13 LAB — CMP14+EGFR
ALT: 9 IU/L (ref 0–44)
AST: 13 IU/L (ref 0–40)
Albumin: 4.3 g/dL (ref 3.7–4.7)
Alkaline Phosphatase: 106 IU/L (ref 44–121)
BUN/Creatinine Ratio: 9 — ABNORMAL LOW (ref 10–24)
BUN: 9 mg/dL (ref 8–27)
Bilirubin Total: 0.4 mg/dL (ref 0.0–1.2)
CO2: 18 mmol/L — ABNORMAL LOW (ref 20–29)
Calcium: 9.7 mg/dL (ref 8.6–10.2)
Chloride: 104 mmol/L (ref 96–106)
Creatinine, Ser: 1.01 mg/dL (ref 0.76–1.27)
Globulin, Total: 2.5 g/dL (ref 1.5–4.5)
Glucose: 198 mg/dL — ABNORMAL HIGH (ref 70–99)
Potassium: 4.3 mmol/L (ref 3.5–5.2)
Sodium: 142 mmol/L (ref 134–144)
Total Protein: 6.8 g/dL (ref 6.0–8.5)
eGFR: 71 mL/min/1.73 (ref >59)

## 2024-07-13 LAB — LIPID PANEL
Chol/HDL Ratio: 3.1 ratio (ref 0.0–5.0)
Cholesterol, Total: 147 mg/dL (ref 100–199)
HDL: 48 mg/dL (ref >39)
LDL Chol Calc (NIH): 79 mg/dL (ref 0–99)
Triglycerides: 109 mg/dL (ref 0–149)
VLDL Cholesterol Cal: 20 mg/dL (ref 5–40)

## 2024-07-13 LAB — CBC WITH DIFFERENTIAL/PLATELET
Basophils Absolute: 0 x10E3/uL (ref 0.0–0.2)
Basos: 0 %
EOS (ABSOLUTE): 0 x10E3/uL (ref 0.0–0.4)
Eos: 1 %
Hematocrit: 30.8 % — ABNORMAL LOW (ref 37.5–51.0)
Hemoglobin: 10.2 g/dL — ABNORMAL LOW (ref 13.0–17.7)
Immature Grans (Abs): 0 x10E3/uL (ref 0.0–0.1)
Immature Granulocytes: 0 %
Lymphocytes Absolute: 0.7 x10E3/uL (ref 0.7–3.1)
Lymphs: 15 %
MCH: 30.1 pg (ref 26.6–33.0)
MCHC: 33.1 g/dL (ref 31.5–35.7)
MCV: 91 fL (ref 79–97)
Monocytes Absolute: 0.3 x10E3/uL (ref 0.1–0.9)
Monocytes: 7 %
Neutrophils Absolute: 3.5 x10E3/uL (ref 1.4–7.0)
Neutrophils: 77 %
Platelets: 303 x10E3/uL (ref 150–450)
RBC: 3.39 x10E6/uL — ABNORMAL LOW (ref 4.14–5.80)
RDW: 14.6 % (ref 11.6–15.4)
WBC: 4.6 x10E3/uL (ref 3.4–10.8)

## 2024-07-13 LAB — D-DIMER, QUANTITATIVE: D-DIMER: 4.12 mg{FEU}/L — AB (ref 0.00–0.49)

## 2024-07-14 ENCOUNTER — Telehealth: Payer: Self-pay | Admitting: *Deleted

## 2024-07-14 NOTE — Telephone Encounter (Signed)
 TC from [place] nurse w/ [facility] Went by pt's house today for visit He was irritated today saying that she had already left by the time that he had gotten to the door. She is going to discharge him from nursing not based on the above but that he says his incision looks fine and that the doctor looked at it. Will still continue with PT & OT.

## 2024-07-16 ENCOUNTER — Telehealth: Payer: Self-pay

## 2024-07-16 NOTE — Telephone Encounter (Signed)
 Copied from CRM [id number]. Topic: Clinical - Lab/Test Results >> Jul 16, 2024 12:52 PM Santiya F wrote: Reason for CRM: Patient is calling in because he would like his lab results. He says he called in earlier and only some of them were ready. Please follow up with patient.

## 2024-07-19 ENCOUNTER — Ambulatory Visit: Payer: Self-pay | Admitting: Family Medicine

## 2024-07-19 ENCOUNTER — Other Ambulatory Visit: Payer: Self-pay | Admitting: Family

## 2024-07-19 ENCOUNTER — Telehealth: Payer: Self-pay

## 2024-07-19 DIAGNOSIS — G8929 Other chronic pain: Secondary | ICD-10-CM

## 2024-07-19 NOTE — Telephone Encounter (Signed)
 Copied from CRM [id number]. Topic: Clinical - Lab/Test Results >> Jul 19, 2024  1:58 PM Leonette SQUIBB wrote: Reason for CRM: Patient called regarding his lab results.

## 2024-07-20 DIAGNOSIS — E1151 Type 2 diabetes mellitus with diabetic peripheral angiopathy without gangrene: Secondary | ICD-10-CM | POA: Diagnosis not present

## 2024-07-20 DIAGNOSIS — L89626 Pressure-induced deep tissue damage of left heel: Secondary | ICD-10-CM | POA: Diagnosis not present

## 2024-07-20 DIAGNOSIS — G8929 Other chronic pain: Secondary | ICD-10-CM | POA: Diagnosis not present

## 2024-07-20 DIAGNOSIS — Z471 Aftercare following joint replacement surgery: Secondary | ICD-10-CM | POA: Diagnosis not present

## 2024-07-20 DIAGNOSIS — K2100 Gastro-esophageal reflux disease with esophagitis, without bleeding: Secondary | ICD-10-CM | POA: Diagnosis not present

## 2024-07-20 DIAGNOSIS — Z96642 Presence of left artificial hip joint: Secondary | ICD-10-CM | POA: Diagnosis not present

## 2024-07-20 DIAGNOSIS — E785 Hyperlipidemia, unspecified: Secondary | ICD-10-CM | POA: Diagnosis not present

## 2024-07-20 DIAGNOSIS — L84 Corns and callosities: Secondary | ICD-10-CM | POA: Diagnosis not present

## 2024-07-20 DIAGNOSIS — M545 Low back pain, unspecified: Secondary | ICD-10-CM | POA: Diagnosis not present

## 2024-07-20 DIAGNOSIS — E1136 Type 2 diabetes mellitus with diabetic cataract: Secondary | ICD-10-CM | POA: Diagnosis not present

## 2024-07-20 DIAGNOSIS — D649 Anemia, unspecified: Secondary | ICD-10-CM | POA: Diagnosis not present

## 2024-07-20 DIAGNOSIS — Z604 Social exclusion and rejection: Secondary | ICD-10-CM | POA: Diagnosis not present

## 2024-07-20 DIAGNOSIS — H4020X0 Unspecified primary angle-closure glaucoma, stage unspecified: Secondary | ICD-10-CM | POA: Diagnosis not present

## 2024-07-20 DIAGNOSIS — Z7984 Long term (current) use of oral hypoglycemic drugs: Secondary | ICD-10-CM | POA: Diagnosis not present

## 2024-07-20 DIAGNOSIS — J45909 Unspecified asthma, uncomplicated: Secondary | ICD-10-CM | POA: Diagnosis not present

## 2024-07-20 DIAGNOSIS — I10 Essential (primary) hypertension: Secondary | ICD-10-CM | POA: Diagnosis not present

## 2024-07-20 DIAGNOSIS — E876 Hypokalemia: Secondary | ICD-10-CM | POA: Diagnosis not present

## 2024-07-20 DIAGNOSIS — H9193 Unspecified hearing loss, bilateral: Secondary | ICD-10-CM | POA: Diagnosis not present

## 2024-07-20 DIAGNOSIS — K2970 Gastritis, unspecified, without bleeding: Secondary | ICD-10-CM | POA: Diagnosis not present

## 2024-07-20 DIAGNOSIS — Z7982 Long term (current) use of aspirin: Secondary | ICD-10-CM | POA: Diagnosis not present

## 2024-07-22 ENCOUNTER — Encounter: Admitting: Physician Assistant

## 2024-07-22 ENCOUNTER — Ambulatory Visit (INDEPENDENT_AMBULATORY_CARE_PROVIDER_SITE_OTHER): Admitting: Physician Assistant

## 2024-07-22 ENCOUNTER — Encounter: Payer: Self-pay | Admitting: Physician Assistant

## 2024-07-22 DIAGNOSIS — Z96642 Presence of left artificial hip joint: Secondary | ICD-10-CM

## 2024-07-22 NOTE — Progress Notes (Signed)
 HPI: [name] comes in today status post left total hip arthroplasty 05/28/2024.  He states he is overall doing well.  Denies any pain in the hip.  States he is doing good.  He is doing home exercises.  He is using a cane to ambulate.  He has no complaints.  He is taking no medications for the left hip.  Physical exam: General Well-developed well-nourished male no acute distress ambulates with a cane.  Able to get on and off the exam table on his own. Left hip: Surgical incisions healing well good range of motion slight decreased range of motion with extremes of external and internal rotation but no pain.  Calf supple nontender.  Dorsiflexion plantarflexion left ankle intact.  Impression: Status post left total hip arthroplasty 05/28/2024  Plan: Recommend he continue to work on scar tissue mobilization.  Continue to work on range of motion strengthening.  He will follow-up with us  at his 63-month postop visit we will obtain an AP pelvis and lateral view of the left hip at that time.  He will follow-up with us  sooner if there is any questions concerns.

## 2024-07-27 DIAGNOSIS — I10 Essential (primary) hypertension: Secondary | ICD-10-CM | POA: Diagnosis not present

## 2024-07-27 DIAGNOSIS — K2100 Gastro-esophageal reflux disease with esophagitis, without bleeding: Secondary | ICD-10-CM | POA: Diagnosis not present

## 2024-07-27 DIAGNOSIS — K2970 Gastritis, unspecified, without bleeding: Secondary | ICD-10-CM | POA: Diagnosis not present

## 2024-07-27 DIAGNOSIS — E785 Hyperlipidemia, unspecified: Secondary | ICD-10-CM | POA: Diagnosis not present

## 2024-07-27 DIAGNOSIS — Z7984 Long term (current) use of oral hypoglycemic drugs: Secondary | ICD-10-CM | POA: Diagnosis not present

## 2024-07-27 DIAGNOSIS — G8929 Other chronic pain: Secondary | ICD-10-CM | POA: Diagnosis not present

## 2024-07-27 DIAGNOSIS — H4020X0 Unspecified primary angle-closure glaucoma, stage unspecified: Secondary | ICD-10-CM | POA: Diagnosis not present

## 2024-07-27 DIAGNOSIS — L84 Corns and callosities: Secondary | ICD-10-CM | POA: Diagnosis not present

## 2024-07-27 DIAGNOSIS — Z7982 Long term (current) use of aspirin: Secondary | ICD-10-CM | POA: Diagnosis not present

## 2024-07-27 DIAGNOSIS — M545 Low back pain, unspecified: Secondary | ICD-10-CM | POA: Diagnosis not present

## 2024-07-27 DIAGNOSIS — Z471 Aftercare following joint replacement surgery: Secondary | ICD-10-CM | POA: Diagnosis not present

## 2024-07-27 DIAGNOSIS — Z604 Social exclusion and rejection: Secondary | ICD-10-CM | POA: Diagnosis not present

## 2024-07-27 DIAGNOSIS — L89626 Pressure-induced deep tissue damage of left heel: Secondary | ICD-10-CM | POA: Diagnosis not present

## 2024-07-27 DIAGNOSIS — E1151 Type 2 diabetes mellitus with diabetic peripheral angiopathy without gangrene: Secondary | ICD-10-CM | POA: Diagnosis not present

## 2024-07-27 DIAGNOSIS — Z96642 Presence of left artificial hip joint: Secondary | ICD-10-CM | POA: Diagnosis not present

## 2024-07-27 DIAGNOSIS — E876 Hypokalemia: Secondary | ICD-10-CM | POA: Diagnosis not present

## 2024-07-27 DIAGNOSIS — J45909 Unspecified asthma, uncomplicated: Secondary | ICD-10-CM | POA: Diagnosis not present

## 2024-07-27 DIAGNOSIS — E1136 Type 2 diabetes mellitus with diabetic cataract: Secondary | ICD-10-CM | POA: Diagnosis not present

## 2024-07-27 DIAGNOSIS — H9193 Unspecified hearing loss, bilateral: Secondary | ICD-10-CM | POA: Diagnosis not present

## 2024-07-27 DIAGNOSIS — D649 Anemia, unspecified: Secondary | ICD-10-CM | POA: Diagnosis not present

## 2024-07-29 ENCOUNTER — Ambulatory Visit: Payer: Self-pay | Admitting: Family

## 2024-07-29 ENCOUNTER — Telehealth: Payer: Self-pay | Admitting: Family

## 2024-07-29 DIAGNOSIS — L97509 Non-pressure chronic ulcer of other part of unspecified foot with unspecified severity: Secondary | ICD-10-CM | POA: Diagnosis not present

## 2024-07-29 DIAGNOSIS — L97522 Non-pressure chronic ulcer of other part of left foot with fat layer exposed: Secondary | ICD-10-CM | POA: Diagnosis not present

## 2024-07-29 NOTE — Telephone Encounter (Signed)
 FYI Only or Action Required?: Action required by provider: request for appointment.  Patient was last seen in primary care on 07/12/2024 by Zollie Lowers, MD.  Called Nurse Triage reporting Foot Pain.  Symptoms began about a month ago.   Symptoms are: unchanged.  Triage Disposition: See HCP Within 4 Hours (Or PCP Triage)  Patient/caregiver understands and will follow disposition?: No, wishes to speak with PCP               Copied from CRM #[id number]. Topic: Clinical - Red Word Triage >> Jul 29, 2024  9:36 AM Miquel SAILOR wrote: Red Word that prompted transfer to Nurse Triage: LT foot Pain heel for 1 month very hard to walk feels worse Reason for Disposition  [1] SEVERE pain (e.g., excruciating, unable to do any normal activities) AND [2] not improved after 2 hours of pain medicine  Answer Assessment - Initial Assessment Questions This RN recommends pt goes to an urgent care as no appointment availability in office today. Pt does not know of an urgent care near him. This RN gave pt options and also told pt he could go to ED based off level of pain. Pt states he will just deal with it and hung up phone.     ONSET: When did the pain start?      One month LOCATION: Where is the pain located?      Left heel of foot PAIN: How bad is the pain?    (Scale 1-10; or mild, moderate, severe)     8/10 pain level CAUSE: What do you think is causing the foot pain?     Laying in bed too much after surgery OTHER SYMPTOMS: Do you have any other symptoms? (e.g., leg pain, rash, fever, numbness)     Denies  Protocols used: Foot Pain-A-AH

## 2024-07-29 NOTE — Telephone Encounter (Signed)
 Called pt to schedule appointment about his foot. I offered him the first available which is tomorrow afternoon. He refused that appt. I told pt he could try urgent care.

## 2024-08-02 ENCOUNTER — Ambulatory Visit: Payer: Self-pay

## 2024-08-02 ENCOUNTER — Ambulatory Visit (INDEPENDENT_AMBULATORY_CARE_PROVIDER_SITE_OTHER)

## 2024-08-02 ENCOUNTER — Encounter: Admitting: Physician Assistant

## 2024-08-02 DIAGNOSIS — F419 Anxiety disorder, unspecified: Secondary | ICD-10-CM

## 2024-08-02 DIAGNOSIS — E1151 Type 2 diabetes mellitus with diabetic peripheral angiopathy without gangrene: Secondary | ICD-10-CM | POA: Diagnosis not present

## 2024-08-02 DIAGNOSIS — G40909 Epilepsy, unspecified, not intractable, without status epilepticus: Secondary | ICD-10-CM

## 2024-08-02 DIAGNOSIS — K2970 Gastritis, unspecified, without bleeding: Secondary | ICD-10-CM | POA: Diagnosis not present

## 2024-08-02 DIAGNOSIS — F609 Personality disorder, unspecified: Secondary | ICD-10-CM | POA: Diagnosis not present

## 2024-08-02 DIAGNOSIS — E785 Hyperlipidemia, unspecified: Secondary | ICD-10-CM | POA: Diagnosis not present

## 2024-08-02 DIAGNOSIS — K2100 Gastro-esophageal reflux disease with esophagitis, without bleeding: Secondary | ICD-10-CM

## 2024-08-02 DIAGNOSIS — E1136 Type 2 diabetes mellitus with diabetic cataract: Secondary | ICD-10-CM

## 2024-08-02 DIAGNOSIS — I10 Essential (primary) hypertension: Secondary | ICD-10-CM

## 2024-08-02 DIAGNOSIS — Z96642 Presence of left artificial hip joint: Secondary | ICD-10-CM | POA: Diagnosis not present

## 2024-08-02 DIAGNOSIS — F39 Unspecified mood [affective] disorder: Secondary | ICD-10-CM

## 2024-08-02 DIAGNOSIS — Z471 Aftercare following joint replacement surgery: Secondary | ICD-10-CM | POA: Diagnosis not present

## 2024-08-02 NOTE — Telephone Encounter (Signed)
 Noted patient has appt tomorrow 08/03/24 with Lavell

## 2024-08-02 NOTE — Telephone Encounter (Signed)
 FYI Only or Action Required?: Action required by provider: request for appointment.  Patient was last seen in primary care on 07/12/2024 by Zollie Lowers, MD.  Called Nurse Triage reporting Foot Pain.  Symptoms began several months ago.  Interventions attempted: Other: no detail.  Symptoms are: unchanged.  Triage Disposition: See PCP When Office is Open (Within 3 Days)  Patient/caregiver understands and will follow disposition?: YesCopied from CRM [id number]. Topic: Clinical - Red Word Triage >> Aug 02, 2024 10:59 AM [name] wrote: [facility] that prompted transfer to Nurse Triage:   Worsening heel pain on left foot Reason for Disposition  [1] MODERATE pain (e.g., interferes with normal activities, limping) AND [2] present > 3 days  Answer Assessment - Initial Assessment Questions Pt has seen several specialist for this and isn't getting better. I just need Bari Learn to fix me.    1. ONSET: When did the pain start?      2 months ago 2. LOCATION: Where is the pain located?      Left heel 3. PAIN: How bad is the pain?    (Scale 1-10; or mild, moderate, severe)     8- comes and goes 4. WORK OR EXERCISE: Has there been any recent work or exercise that involved this part of the body?      na 5. CAUSE: What do you think is causing the foot pain?     Not sure 6. OTHER SYMPTOMS: Do you have any other symptoms? (e.g., leg pain, rash, fever, numbness)     I've got other issues but I don't want to talk about that. Ive a doctor for those.  Protocols used: Foot Pain-A-AH

## 2024-08-03 ENCOUNTER — Encounter: Payer: Self-pay | Admitting: Family

## 2024-08-03 ENCOUNTER — Ambulatory Visit: Payer: Self-pay | Admitting: Family

## 2024-08-03 ENCOUNTER — Ambulatory Visit (INDEPENDENT_AMBULATORY_CARE_PROVIDER_SITE_OTHER): Admitting: Family

## 2024-08-03 ENCOUNTER — Ambulatory Visit (INDEPENDENT_AMBULATORY_CARE_PROVIDER_SITE_OTHER)

## 2024-08-03 VITALS — BP 131/61 | HR 82 | Temp 98.8°F | Ht 70.0 in | Wt 188.0 lb

## 2024-08-03 DIAGNOSIS — Z794 Long term (current) use of insulin: Secondary | ICD-10-CM

## 2024-08-03 DIAGNOSIS — L97509 Non-pressure chronic ulcer of other part of unspecified foot with unspecified severity: Secondary | ICD-10-CM | POA: Diagnosis not present

## 2024-08-03 DIAGNOSIS — E11621 Type 2 diabetes mellitus with foot ulcer: Secondary | ICD-10-CM

## 2024-08-03 DIAGNOSIS — L97422 Non-pressure chronic ulcer of left heel and midfoot with fat layer exposed: Secondary | ICD-10-CM | POA: Diagnosis not present

## 2024-08-03 DIAGNOSIS — M7989 Other specified soft tissue disorders: Secondary | ICD-10-CM | POA: Diagnosis not present

## 2024-08-03 DIAGNOSIS — E11622 Type 2 diabetes mellitus with other skin ulcer: Secondary | ICD-10-CM | POA: Diagnosis not present

## 2024-08-03 NOTE — Progress Notes (Signed)
 Subjective:    Patient ID: Dale [name], male    DOB: [birth year], 89 y.o.   MRN: [id number]  Chief Complaint  Patient presents with   Foot Pain    Heel pain, left foot   Pt presents to the office today with left heel ulcer. He had a left hip replacement on May 30 and was discharged to SNF.  He went to the Urgent Care on 07/29/24 and started on doxycyline. He had a referral placed to wound care, but states he can not drive far away. States his pain is 7 out 10.   He is followed by Ortho. He completed PT and doing home exercises every day. Reports his pain in his hip 5-6 out 10. Doing much better.  Foot Pain This is a new problem. The current episode started 1 to 4 weeks ago. The problem has been gradually worsening.      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 Reduced social interaction with others. Reduced interaction with family members   Social Drivers of Health   Financial Resource Strain: High Risk (05/27/2024)   Overall Financial Resource Strain (CARDIA)    Difficulty of Paying [name] Expenses: Hard  Food Insecurity: No Food Insecurity (06/14/2024)   Hunger Vital Sign    Worried About  Running Out of Food in the Last Year: Never true    Ran Out of Food in the Last Year: Never true  Recent Concern: Food Insecurity - Food Insecurity Present (05/28/2024)   Hunger Vital Sign    Worried About Running Out of Food in the Last Year: Often true    Ran Out of Food in the Last Year: Often true  Transportation Needs: No Transportation Needs (06/14/2024)   PRAPARE - Administrator, Civil Service (Medical): No    Lack of Transportation (Non-Medical): No  Physical Activity: Insufficiently Active (05/27/2023)   Exercise Vital Sign    Days of Exercise per Week: 3 days    Minutes of Exercise per Session: 30 min  Stress: No Stress Concern Present (05/27/2024)   [employer] of Occupational Health - Occupational Stress Questionnaire    Feeling of Stress : Not at all  Social Connections: Patient Declined (06/14/2024)   Social Connection and Isolation Panel    Frequency of Communication with Friends and Family: Patient declined    Frequency of Social Gatherings with Friends and Family: Patient declined    Attends Religious Services: Patient declined    Database administrator or Organizations: Patient declined  Attends Banker Meetings: Patient declined    Marital Status: Patient declined  Recent Concern: Social Connections - Socially Isolated (05/28/2024)   Social Connection and Isolation Panel    Frequency of Communication with Friends and Family: More than three times a week    Frequency of Social Gatherings with Friends and Family: More than three times a week    Attends Religious Services: Never    Database administrator or Organizations: No    Attends Banker Meetings: Never    Marital Status: Widowed   Family History  Problem Relation Age of Onset   Diabetes Father    Diabetes Sister    Diabetes Brother    Cancer Brother    Cancer Brother    Heart disease Sister         Objective:   Physical Exam Vitals reviewed.   Constitutional:      General: He is not in acute distress.    Appearance: He is well-developed.  HENT:     Head: Normocephalic.  Eyes:     General:        Right eye: No discharge.        Left eye: No discharge.     Pupils: Pupils are equal, round, and reactive to light.  Neck:     Thyroid : No thyromegaly.  Cardiovascular:     Rate and Rhythm: Normal rate and regular rhythm.     Heart sounds: Normal heart sounds. No murmur heard. Pulmonary:     Effort: Pulmonary effort is normal. No respiratory distress.     Breath sounds: Normal breath sounds. No wheezing.  Abdominal:     General: Bowel sounds are normal. There is no distension.     Palpations: Abdomen is soft.     Tenderness: There is no abdominal tenderness.  Musculoskeletal:        General: Tenderness present.     Cervical back: Normal range of motion and neck supple.       Feet:  Feet:     Comments: Left lateral heel ulcer approx 4X1.5 cm, odor Skin:    General: Skin is warm and dry.     Findings: No erythema or rash.  Neurological:     Mental Status: He is alert and oriented to person, place, and time.     Cranial Nerves: No cranial nerve deficit.     Deep Tendon Reflexes: Reflexes are normal and symmetric.  Psychiatric:        Behavior: Behavior normal.        Thought Content: Thought content normal.        Judgment: Judgment normal.       BP 131/61   Pulse 82   Temp 98.8 F (37.1 C)   Ht 5' 10 (1.778 m)   Wt 188 lb (85.3 kg)   SpO2 97%   BMI 26.98 kg/m      Assessment & Plan:  Dale [name] comes in today with chief complaint of Foot Pain (Heel pain, left foot)   Diagnosis and orders addressed:  1. Ulcer of left heel and midfoot with fat layer exposed (HCC) (Primary) - DG Foot Complete Left; Future - CMP14+EGFR - CBC with Differential/Platelet - Ambulatory referral to Podiatry  2. Type 2 diabetes mellitus with foot ulcer, with long-term current use of insulin  (HCC) - DG Foot Complete  Left; Future - CBC with Differential/Platelet - Ambulatory referral to Podiatry   Labs pending X-ray negative for osteomyelitis  Referral for  Podiatry Follow up in 3 days Avoid any pressure on heel Report any fevers, increased erythemas, or discharge Approx 50 mins spent with patient, chart review, wound care, and patient education    Bari Learn, [place]

## 2024-08-04 LAB — CMP14+EGFR
ALT: 8 IU/L (ref 0–44)
AST: 18 IU/L (ref 0–40)
Albumin: 4.7 g/dL (ref 3.7–4.7)
Alkaline Phosphatase: 89 IU/L (ref 44–121)
BUN/Creatinine Ratio: 21 (ref 10–24)
BUN: 22 mg/dL (ref 8–27)
Bilirubin Total: 0.3 mg/dL (ref 0.0–1.2)
CO2: 23 mmol/L (ref 20–29)
Calcium: 10.4 mg/dL — AB (ref 8.6–10.2)
Chloride: 98 mmol/L (ref 96–106)
Creatinine, Ser: 1.06 mg/dL (ref 0.76–1.27)
Globulin, Total: 2.8 g/dL (ref 1.5–4.5)
Glucose: 119 mg/dL — AB (ref 70–99)
Potassium: 4.6 mmol/L (ref 3.5–5.2)
Sodium: 140 mmol/L (ref 134–144)
Total Protein: 7.5 g/dL (ref 6.0–8.5)
eGFR: 67 mL/min/1.73 (ref >59)

## 2024-08-04 LAB — CBC WITH DIFFERENTIAL/PLATELET
Basophils Absolute: 0 x10E3/uL (ref 0.0–0.2)
Basos: 0 %
EOS (ABSOLUTE): 0 x10E3/uL (ref 0.0–0.4)
Eos: 0 %
Hematocrit: 37 % — ABNORMAL LOW (ref 37.5–51.0)
Hemoglobin: 11.9 g/dL — ABNORMAL LOW (ref 13.0–17.7)
Immature Grans (Abs): 0 x10E3/uL (ref 0.0–0.1)
Immature Granulocytes: 0 %
Lymphocytes Absolute: 1.8 x10E3/uL (ref 0.7–3.1)
Lymphs: 22 %
MCH: 29.5 pg (ref 26.6–33.0)
MCHC: 32.2 g/dL (ref 31.5–35.7)
MCV: 92 fL (ref 79–97)
Monocytes Absolute: 0.5 x10E3/uL (ref 0.1–0.9)
Monocytes: 6 %
Neutrophils Absolute: 5.8 x10E3/uL (ref 1.4–7.0)
Neutrophils: 72 %
Platelets: 355 x10E3/uL (ref 150–450)
RBC: 4.04 x10E6/uL — ABNORMAL LOW (ref 4.14–5.80)
RDW: 14.7 % (ref 11.6–15.4)
WBC: 8.2 x10E3/uL (ref 3.4–10.8)

## 2024-08-06 ENCOUNTER — Ambulatory Visit: Admitting: Family

## 2024-08-10 DIAGNOSIS — L97422 Non-pressure chronic ulcer of left heel and midfoot with fat layer exposed: Secondary | ICD-10-CM | POA: Diagnosis not present

## 2024-08-10 DIAGNOSIS — M79672 Pain in left foot: Secondary | ICD-10-CM | POA: Diagnosis not present

## 2024-08-18 ENCOUNTER — Telehealth: Payer: Self-pay

## 2024-08-18 NOTE — Telephone Encounter (Signed)
 Copied from CRM [id number]. Topic: Appointments - Appointment Scheduling >> Aug 18, 2024  2:13 PM [name] wrote: Patient called.. concerned needs bandaged changed at home?  Been waiting to hear back from us ?

## 2024-08-19 NOTE — Telephone Encounter (Signed)
 Can we verify patient seen podiatry? Is this still bandage from his visit with me? He was suppose to have a follow up with me two weeks ago. If his foot is worse he needs to go to ED.

## 2024-08-19 NOTE — Telephone Encounter (Signed)
 Patient did go see Dr. Velma Boehringer. The current dressing is one week old. Patient believes his wound is getting better. Patient scheduled for wound check/dressing change with Bari tomorrow at 2:40pm

## 2024-08-20 ENCOUNTER — Ambulatory Visit (INDEPENDENT_AMBULATORY_CARE_PROVIDER_SITE_OTHER): Admitting: Family

## 2024-08-20 ENCOUNTER — Ambulatory Visit: Admitting: Family

## 2024-08-20 ENCOUNTER — Encounter: Payer: Self-pay | Admitting: Family

## 2024-08-20 VITALS — BP 133/62 | HR 103 | Temp 98.7°F | Ht 70.0 in | Wt 188.0 lb

## 2024-08-20 DIAGNOSIS — Z794 Long term (current) use of insulin: Secondary | ICD-10-CM

## 2024-08-20 DIAGNOSIS — E11621 Type 2 diabetes mellitus with foot ulcer: Secondary | ICD-10-CM | POA: Diagnosis not present

## 2024-08-20 DIAGNOSIS — L97509 Non-pressure chronic ulcer of other part of unspecified foot with unspecified severity: Secondary | ICD-10-CM | POA: Diagnosis not present

## 2024-08-20 DIAGNOSIS — L97529 Non-pressure chronic ulcer of other part of left foot with unspecified severity: Secondary | ICD-10-CM | POA: Diagnosis not present

## 2024-08-20 MED ORDER — DOXYCYCLINE HYCLATE 100 MG PO TABS: 100.0000 mg | ORAL_TABLET | Freq: Two times a day (BID) | ORAL | 0 refills | Status: DC

## 2024-08-20 NOTE — Patient Instructions (Signed)

## 2024-08-20 NOTE — Progress Notes (Signed)
 Subjective:    Patient ID: Dale [name], male    DOB: 12-10-1933, 89 y.o.   MRN: [id number]  Chief Complaint  Patient presents with   Follow-up   Pt presents to the office today with left heel ulcer. He had a left hip replacement on May 30 and was discharged to SNF. Unsure if he developed this at the SNF or home from laying in the bed.    He went to the Urgent Care on 07/29/24 and started on doxycyline. He had a referral placed to wound care, but states he can not drive far away. States his pain is 7 out 10.   He is followed by Ortho. He completed PT and doing home exercises every day. Reports his pain in his hip 5-6 out 10. Doing much better.   He was suppose to follow up in 3 days but no showed for this. We got him in podiatry the next week.  X-ray negative for osteomyelitis  Foot Pain This is a new problem. The current episode started 1 to 4 weeks ago. The problem has been gradually worsening.  Diabetes He presents for his follow-up diabetic visit. He has type 2 diabetes mellitus. Associated symptoms include foot ulcerations. Symptoms are stable. Diabetic complications include peripheral neuropathy. Risk factors for coronary artery disease include dyslipidemia, diabetes mellitus, hypertension, male sex and sedentary lifestyle. He is following a generally healthy diet. (Not checking glucose at home) He sees a podiatrist.     Review of Systems  All other systems reviewed and are negative.   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 Reduced social interaction with others. Reduced interaction with family members   Social Drivers of Health   Financial Resource Strain: High Risk (05/27/2024)   Overall Financial Resource Strain (CARDIA)    Difficulty of Paying [name] Expenses: Hard  Food Insecurity: No Food Insecurity (06/14/2024)   Hunger Vital Sign    Worried About Running Out of Food in the Last Year: Never true    Ran Out of Food in the Last Year: Never true  Recent Concern: Food Insecurity - Food Insecurity Present (05/28/2024)   Hunger Vital Sign    Worried About Running Out of Food in the Last Year: Often true    Ran Out of Food in the Last Year: Often true  Transportation Needs: No Transportation Needs (06/14/2024)   PRAPARE - Administrator, Civil Service (Medical): No    Lack of Transportation (Non-Medical): No  Physical Activity: Insufficiently Active (05/27/2023)   Exercise Vital Sign  Days of Exercise per Week: 3 days    Minutes of Exercise per Session: 30 min  Stress: No Stress Concern Present (05/27/2024)   [employer] of Occupational Health - Occupational Stress Questionnaire    Feeling of Stress : Not at all  Social Connections: Patient Declined (06/14/2024)   Social Connection and Isolation Panel    Frequency of Communication with Friends and Family: Patient declined    Frequency of Social Gatherings with Friends and Family: Patient declined    Attends Religious Services: Patient declined    Database administrator or Organizations: Patient declined    Attends Banker Meetings: Patient declined    Marital Status: Patient declined  Recent Concern: Social  Connections - Socially Isolated (05/28/2024)   Social Connection and Isolation Panel    Frequency of Communication with Friends and Family: More than three times a week    Frequency of Social Gatherings with Friends and Family: More than three times a week    Attends Religious Services: Never    Database administrator or Organizations: No    Attends Banker Meetings: Never    Marital Status: Widowed   Family History  Problem Relation Age of Onset   Diabetes Father    Diabetes Sister    Diabetes Brother    Cancer Brother    Cancer Brother    Heart disease Sister         Objective:   Physical Exam Vitals reviewed.  Constitutional:      General: He is not in acute distress.    Appearance: He is well-developed.  HENT:     Head: Normocephalic.  Eyes:     General:        Right eye: No discharge.        Left eye: No discharge.     Pupils: Pupils are equal, round, and reactive to light.  Neck:     Thyroid : No thyromegaly.  Cardiovascular:     Rate and Rhythm: Normal rate and regular rhythm.     Heart sounds: Normal heart sounds. No murmur heard. Pulmonary:     Effort: Pulmonary effort is normal. No respiratory distress.     Breath sounds: Normal breath sounds. No wheezing.  Abdominal:     General: Bowel sounds are normal. There is no distension.     Palpations: Abdomen is soft.     Tenderness: There is no abdominal tenderness.  Musculoskeletal:        General: Tenderness present.     Cervical back: Normal range of motion and neck supple.     Right foot: No foot drop.     Left foot: No foot drop.       Feet:  Feet:     Comments: Left lateral heel ulcer approx 4.5X1.5 cm, odor Ulcer on great toe 1X0.6 cm Skin:    General: Skin is warm and dry.     Findings: No erythema or rash.  Neurological:     Mental Status: He is alert and oriented to person, place, and time.     Cranial Nerves: No cranial nerve deficit.     Deep Tendon Reflexes: Reflexes are normal  and symmetric.  Psychiatric:        Behavior: Behavior normal.        Thought Content: Thought content normal.        Judgment: Judgment normal.          BP 133/62   Pulse (!) 103  Temp 98.7 F (37.1 C)   Ht 5' 10 (1.778 m)   Wt 188 lb (85.3 kg)   BMI 26.98 kg/m      Assessment & Plan:  Dale [name] comes in today with chief complaint of Follow-up   Diagnosis and orders addressed:  1. Type 2 diabetes mellitus with foot ulcer, with long-term current use of insulin  (HCC) (Primary) - AMB referral to wound care center - doxycycline  (VIBRA -TABS) 100 MG tablet; Take 1 tablet (100 mg total) by mouth 2 (two) times daily.  Dispense: 20 tablet; Refill: 0  2. Ulcer of left foot, unspecified ulcer stage (HCC) - AMB referral to wound care center - doxycycline  (VIBRA -TABS) 100 MG tablet; Take 1 tablet (100 mg total) by mouth 2 (two) times daily.  Dispense: 20 tablet; Refill: 0   3. Skin ulcer of left great toe, unspecified ulcer stage (HCC)  Keep follow up with Podiatry Follow up in 3 days Dressing applied to heel and toe Avoid any pressure on heel Report any fevers, increased erythemas, or discharge Discussed importance of follow up with wound care and Podiatry.   Bari Learn, FNP

## 2024-08-23 ENCOUNTER — Ambulatory Visit (INDEPENDENT_AMBULATORY_CARE_PROVIDER_SITE_OTHER): Admitting: Family

## 2024-08-23 ENCOUNTER — Encounter: Payer: Self-pay | Admitting: Family

## 2024-08-23 VITALS — BP 132/60 | HR 88 | Temp 98.8°F | Ht 70.0 in | Wt 188.0 lb

## 2024-08-23 DIAGNOSIS — Z794 Long term (current) use of insulin: Secondary | ICD-10-CM

## 2024-08-23 DIAGNOSIS — E782 Mixed hyperlipidemia: Secondary | ICD-10-CM

## 2024-08-23 DIAGNOSIS — L97509 Non-pressure chronic ulcer of other part of unspecified foot with unspecified severity: Secondary | ICD-10-CM | POA: Diagnosis not present

## 2024-08-23 DIAGNOSIS — I10 Essential (primary) hypertension: Secondary | ICD-10-CM

## 2024-08-23 DIAGNOSIS — M159 Polyosteoarthritis, unspecified: Secondary | ICD-10-CM

## 2024-08-23 DIAGNOSIS — F411 Generalized anxiety disorder: Secondary | ICD-10-CM

## 2024-08-23 DIAGNOSIS — M545 Low back pain, unspecified: Secondary | ICD-10-CM

## 2024-08-23 DIAGNOSIS — K219 Gastro-esophageal reflux disease without esophagitis: Secondary | ICD-10-CM

## 2024-08-23 DIAGNOSIS — N400 Enlarged prostate without lower urinary tract symptoms: Secondary | ICD-10-CM

## 2024-08-23 DIAGNOSIS — Z79899 Other long term (current) drug therapy: Secondary | ICD-10-CM

## 2024-08-23 DIAGNOSIS — E11621 Type 2 diabetes mellitus with foot ulcer: Secondary | ICD-10-CM | POA: Diagnosis not present

## 2024-08-23 DIAGNOSIS — F1320 Sedative, hypnotic or anxiolytic dependence, uncomplicated: Secondary | ICD-10-CM

## 2024-08-23 DIAGNOSIS — F419 Anxiety disorder, unspecified: Secondary | ICD-10-CM
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
## 2024-08-23 NOTE — Patient Instructions (Signed)

## 2024-08-24 ENCOUNTER — Ambulatory Visit: Admitting: Family

## 2024-08-26 ENCOUNTER — Ambulatory Visit (INDEPENDENT_AMBULATORY_CARE_PROVIDER_SITE_OTHER): Admitting: Family

## 2024-08-26 ENCOUNTER — Encounter: Payer: Self-pay | Admitting: Family

## 2024-08-26 VITALS — BP 132/70 | HR 109 | Temp 97.3°F | Ht 70.0 in | Wt 188.6 lb

## 2024-08-26 DIAGNOSIS — L97529 Non-pressure chronic ulcer of other part of left foot with unspecified severity: Secondary | ICD-10-CM

## 2024-08-26 DIAGNOSIS — E11621 Type 2 diabetes mellitus with foot ulcer: Secondary | ICD-10-CM

## 2024-08-26 DIAGNOSIS — Z794 Long term (current) use of insulin: Secondary | ICD-10-CM | POA: Diagnosis not present

## 2024-08-26 DIAGNOSIS — L97509 Non-pressure chronic ulcer of other part of unspecified foot with unspecified severity: Secondary | ICD-10-CM
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
## 2024-08-26 NOTE — Patient Instructions (Signed)

## 2024-08-31 ENCOUNTER — Ambulatory Visit: Admitting: Family

## 2024-08-31 DIAGNOSIS — L97521 Non-pressure chronic ulcer of other part of left foot limited to breakdown of skin: Secondary | ICD-10-CM | POA: Diagnosis not present

## 2024-08-31 DIAGNOSIS — E1142 Type 2 diabetes mellitus with diabetic polyneuropathy: Secondary | ICD-10-CM | POA: Diagnosis not present

## 2024-09-01 ENCOUNTER — Telehealth: Payer: Self-pay

## 2024-09-01 DIAGNOSIS — Z7984 Long term (current) use of oral hypoglycemic drugs: Secondary | ICD-10-CM | POA: Diagnosis not present

## 2024-09-01 DIAGNOSIS — D649 Anemia, unspecified: Secondary | ICD-10-CM | POA: Diagnosis not present

## 2024-09-01 DIAGNOSIS — E11621 Type 2 diabetes mellitus with foot ulcer: Secondary | ICD-10-CM | POA: Diagnosis not present

## 2024-09-01 DIAGNOSIS — L97521 Non-pressure chronic ulcer of other part of left foot limited to breakdown of skin: Secondary | ICD-10-CM | POA: Diagnosis not present

## 2024-09-01 DIAGNOSIS — J45909 Unspecified asthma, uncomplicated: Secondary | ICD-10-CM | POA: Diagnosis not present

## 2024-09-01 DIAGNOSIS — Z794 Long term (current) use of insulin: Secondary | ICD-10-CM | POA: Diagnosis not present

## 2024-09-01 DIAGNOSIS — Z7982 Long term (current) use of aspirin: Secondary | ICD-10-CM | POA: Diagnosis not present

## 2024-09-01 DIAGNOSIS — Z87891 Personal history of nicotine dependence: Secondary | ICD-10-CM | POA: Diagnosis not present

## 2024-09-01 DIAGNOSIS — Z791 Long term (current) use of non-steroidal anti-inflammatories (NSAID): Secondary | ICD-10-CM | POA: Diagnosis not present

## 2024-09-01 DIAGNOSIS — Z556 Problems related to health literacy: Secondary | ICD-10-CM | POA: Diagnosis not present

## 2024-09-01 DIAGNOSIS — L97421 Non-pressure chronic ulcer of left heel and midfoot limited to breakdown of skin: Secondary | ICD-10-CM | POA: Diagnosis not present

## 2024-09-01 DIAGNOSIS — E1151 Type 2 diabetes mellitus with diabetic peripheral angiopathy without gangrene: Secondary | ICD-10-CM | POA: Diagnosis not present

## 2024-09-01 DIAGNOSIS — I10 Essential (primary) hypertension: Secondary | ICD-10-CM | POA: Diagnosis not present

## 2024-09-01 DIAGNOSIS — Z96642 Presence of left artificial hip joint: Secondary | ICD-10-CM | POA: Diagnosis not present

## 2024-09-01 DIAGNOSIS — Z604 Social exclusion and rejection: Secondary | ICD-10-CM | POA: Diagnosis not present

## 2024-09-01 NOTE — Telephone Encounter (Signed)
 Spoke with Pam and gave her verbal okay for skilled nursing with freq of 3x per week for 1 week and 2x per week for 7 weeks.   Also made Pam aware that we have been seeing patient for his wound care but he missed his appt with us  yesterday.  Pam asked what she could do to clean/dress it. I advised that per most recent OV notes, we ulcer cleaned and debridement around heel ulcer. Mepilex applied to heel and great toe and then coban to hold mepilex in place. Pam voiced understanding and will do the same.

## 2024-09-01 NOTE — Telephone Encounter (Signed)
 Copied from CRM [id number]. Topic: Clinical - Home Health Verbal Orders >> Sep 01, 2024  3:48 PM Everette C wrote: Caller/Agency: [facility] Number: [phone] Service Requested: Skilled Nursing Frequency: 3w1 2w7 Any new concerns about the patient? Yes  Pam would like to discuss a wound on the patient's left big toe that may need attention

## 2024-09-02 ENCOUNTER — Encounter: Payer: Self-pay | Admitting: Family

## 2024-09-03 DIAGNOSIS — Z7982 Long term (current) use of aspirin: Secondary | ICD-10-CM | POA: Diagnosis not present

## 2024-09-03 DIAGNOSIS — I10 Essential (primary) hypertension: Secondary | ICD-10-CM | POA: Diagnosis not present

## 2024-09-03 DIAGNOSIS — E1151 Type 2 diabetes mellitus with diabetic peripheral angiopathy without gangrene: Secondary | ICD-10-CM | POA: Diagnosis not present

## 2024-09-03 DIAGNOSIS — Z87891 Personal history of nicotine dependence: Secondary | ICD-10-CM | POA: Diagnosis not present

## 2024-09-03 DIAGNOSIS — E11621 Type 2 diabetes mellitus with foot ulcer: Secondary | ICD-10-CM | POA: Diagnosis not present

## 2024-09-03 DIAGNOSIS — Z7984 Long term (current) use of oral hypoglycemic drugs: Secondary | ICD-10-CM | POA: Diagnosis not present

## 2024-09-03 DIAGNOSIS — J45909 Unspecified asthma, uncomplicated: Secondary | ICD-10-CM | POA: Diagnosis not present

## 2024-09-03 DIAGNOSIS — Z96642 Presence of left artificial hip joint: Secondary | ICD-10-CM | POA: Diagnosis not present

## 2024-09-03 DIAGNOSIS — Z556 Problems related to health literacy: Secondary | ICD-10-CM | POA: Diagnosis not present

## 2024-09-03 DIAGNOSIS — Z791 Long term (current) use of non-steroidal anti-inflammatories (NSAID): Secondary | ICD-10-CM | POA: Diagnosis not present

## 2024-09-03 DIAGNOSIS — Z794 Long term (current) use of insulin: Secondary | ICD-10-CM | POA: Diagnosis not present

## 2024-09-03 DIAGNOSIS — L97521 Non-pressure chronic ulcer of other part of left foot limited to breakdown of skin: Secondary | ICD-10-CM | POA: Diagnosis not present

## 2024-09-03 DIAGNOSIS — L97421 Non-pressure chronic ulcer of left heel and midfoot limited to breakdown of skin: Secondary | ICD-10-CM | POA: Diagnosis not present

## 2024-09-03 DIAGNOSIS — D649 Anemia, unspecified: Secondary | ICD-10-CM | POA: Diagnosis not present

## 2024-09-03 DIAGNOSIS — Z604 Social exclusion and rejection: Secondary | ICD-10-CM | POA: Diagnosis not present

## 2024-09-05 ENCOUNTER — Other Ambulatory Visit: Payer: Self-pay | Admitting: Family

## 2024-09-05 DIAGNOSIS — F1320 Sedative, hypnotic or anxiolytic dependence, uncomplicated: Secondary | ICD-10-CM

## 2024-09-05 DIAGNOSIS — Z79899 Other long term (current) drug therapy: Secondary | ICD-10-CM

## 2024-09-05 DIAGNOSIS — F419 Anxiety disorder, unspecified: Secondary | ICD-10-CM

## 2024-09-06 DIAGNOSIS — Z87891 Personal history of nicotine dependence: Secondary | ICD-10-CM | POA: Diagnosis not present

## 2024-09-06 DIAGNOSIS — E11621 Type 2 diabetes mellitus with foot ulcer: Secondary | ICD-10-CM | POA: Diagnosis not present

## 2024-09-06 DIAGNOSIS — Z556 Problems related to health literacy: Secondary | ICD-10-CM | POA: Diagnosis not present

## 2024-09-06 DIAGNOSIS — Z96642 Presence of left artificial hip joint: Secondary | ICD-10-CM | POA: Diagnosis not present

## 2024-09-06 DIAGNOSIS — L97521 Non-pressure chronic ulcer of other part of left foot limited to breakdown of skin: Secondary | ICD-10-CM | POA: Diagnosis not present

## 2024-09-06 DIAGNOSIS — Z7984 Long term (current) use of oral hypoglycemic drugs: Secondary | ICD-10-CM | POA: Diagnosis not present

## 2024-09-06 DIAGNOSIS — J45909 Unspecified asthma, uncomplicated: Secondary | ICD-10-CM | POA: Diagnosis not present

## 2024-09-06 DIAGNOSIS — Z794 Long term (current) use of insulin: Secondary | ICD-10-CM | POA: Diagnosis not present

## 2024-09-06 DIAGNOSIS — E1151 Type 2 diabetes mellitus with diabetic peripheral angiopathy without gangrene: Secondary | ICD-10-CM | POA: Diagnosis not present

## 2024-09-06 DIAGNOSIS — L97421 Non-pressure chronic ulcer of left heel and midfoot limited to breakdown of skin: Secondary | ICD-10-CM | POA: Diagnosis not present

## 2024-09-06 DIAGNOSIS — I10 Essential (primary) hypertension: Secondary | ICD-10-CM | POA: Diagnosis not present

## 2024-09-06 DIAGNOSIS — Z7982 Long term (current) use of aspirin: Secondary | ICD-10-CM | POA: Diagnosis not present

## 2024-09-06 DIAGNOSIS — D649 Anemia, unspecified: Secondary | ICD-10-CM | POA: Diagnosis not present

## 2024-09-06 DIAGNOSIS — Z791 Long term (current) use of non-steroidal anti-inflammatories (NSAID): Secondary | ICD-10-CM | POA: Diagnosis not present

## 2024-09-06 DIAGNOSIS — Z604 Social exclusion and rejection: Secondary | ICD-10-CM | POA: Diagnosis not present

## 2024-09-07 DIAGNOSIS — L97521 Non-pressure chronic ulcer of other part of left foot limited to breakdown of skin: Secondary | ICD-10-CM | POA: Diagnosis not present

## 2024-09-09 ENCOUNTER — Encounter: Payer: Self-pay | Admitting: Nurse Practitioner

## 2024-09-09 ENCOUNTER — Ambulatory Visit (INDEPENDENT_AMBULATORY_CARE_PROVIDER_SITE_OTHER): Admitting: Nurse Practitioner

## 2024-09-09 VITALS — BP 158/81 | HR 114 | Temp 98.0°F | Ht 70.0 in | Wt 188.0 lb

## 2024-09-09 DIAGNOSIS — R0602 Shortness of breath: Secondary | ICD-10-CM

## 2024-09-09 DIAGNOSIS — R000 Tachycardia, unspecified: Secondary | ICD-10-CM | POA: Diagnosis not present

## 2024-09-09 NOTE — Progress Notes (Signed)
   Subjective:    Patient ID: [name], male    DOB: [birth year], 89 y.o.   MRN: [id number]   Chief Complaint: Breathing Problem   Breathing Problem He complains of difficulty breathing and shortness of breath. There is no cough.    Patient in c/o sob the last 3 days. He recently had hip replacement and just got out of rehab.  Patient Active Problem List   Diagnosis Date Noted   Lactic acidosis 06/14/2024   Hypomagnesemia 06/14/2024   Normocytic anemia 06/14/2024   GAD (generalized anxiety disorder) 06/14/2024   Urinary incontinence 06/14/2024   Hx of total hip arthroplasty 05/28/2024   Unilateral primary osteoarthritis, left hip 02/25/2024   Essential hypertension 02/07/2022   Osteoarthritis of multiple joints 05/08/2021   PAD (peripheral artery disease) (HCC) 02/13/2021   Benzodiazepine dependence (HCC) 10/16/2018   Controlled substance agreement signed 10/16/2018   BPH (benign prostatic hyperplasia) 07/17/2018   Edema 05/01/2017   Chronic back pain 07/11/2016   Vitamin D  insufficiency 10/13/2013   DOE (dyspnea on exertion) 04/22/2013   Vocal fold paresis, 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 Negative for cough.   Cardiovascular: Negative.        Objective:   Physical Exam Constitutional:      Appearance: Normal appearance.  Cardiovascular:     Rate and Rhythm: Normal rate and regular rhythm.     Heart sounds: Normal heart sounds.  Pulmonary:     Breath sounds: Normal breath sounds.  Skin:    General: Skin is warm.  Neurological:     General: No focal deficit present.     Mental Status: He is alert and oriented to person, place, and time.  Psychiatric:        Mood and Affect: Mood normal.         Behavior: Behavior normal.    BP (!) 158/81   Pulse (!) 114   Temp 98 F (36.7 C) (Temporal)   Ht 5' 10 (1.778 m)   Wt 188 lb (85.3 kg)   SpO2 97%   BMI 26.98 kg/m         Assessment & Plan:   [name] in today with chief complaint of Breathing Problem   1. SOB (shortness of breath) (Primary) Run humidifier in home Continue all meds  2. Sinus tachycardia Avoid caffeine Ref cardiologist    The above assessment and management plan was discussed with the patient. The patient verbalized understanding of and has agreed to the management plan. Patient is aware to call the clinic if symptoms persist or worsen. Patient is aware when to return to the clinic for a follow-up visit. Patient educated on when it is appropriate to go to the emergency department.   Mary-Margaret Gladis, FNP

## 2024-09-09 NOTE — Patient Instructions (Signed)
 Sinus Tachycardia  Sinus tachycardia is a fast heartbeat. In sinus tachycardia, the heart beats more than 100 times a minute. Sinus tachycardia starts in the part of the heart called the sinoatrial (SA) node. Sinus tachycardia may be harmless, or it may be a sign of a serious condition. What are the causes? This condition may be caused by: Exercise or exertion. A fever. Pain. Loss of body fluids (dehydration). Severe bleeding (hemorrhage). Anxiety and stress. Certain substances, including: Alcohol. Caffeine. Tobacco and nicotine products. Cold medicines. Illegal drugs. Medical conditions including: Heart disease. An infection. An overactive thyroid  (hyperthyroidism). A lack of red blood cells (anemia). What are the signs or symptoms? Symptoms of this condition include: A feeling that the heart is beating fast or unevenly (palpitations). Suddenly noticing your heartbeat (cardiac awareness). Lightheadedness. Tiredness (fatigue). Shortness of breath. Chest pain. Nausea. Fainting. How is this diagnosed? This condition is diagnosed with: A physical exam. Tests or monitoring, such as: Blood tests. An electrocardiogram (ECG). This test measures the electrical activity of the heart. Ambulatory cardiac monitor. This records your heartbeats for 24 hours or more. You may be referred to a heart specialist (cardiologist). How is this treated? Treatment for this condition depends on the cause. Treatment may involve: Treating the underlying condition. Taking new medicines or changing your current medicines as told by your health care provider. Making changes to your diet or lifestyle. Follow these instructions at home: Lifestyle  Do not use any products that contain nicotine or tobacco. These products include cigarettes, chewing tobacco, and vaping devices, such as e-cigarettes. If you need help quitting, ask your health care provider. Do not use illegal drugs, such as  cocaine. Learn relaxation methods to help you when you get stressed or anxious. These include deep breathing. Avoid caffeine or other stimulants, including herbal stimulants that are found in energy drinks. Alcohol use  Do not drink alcohol if: Your health care provider tells you not to drink. You are pregnant, may be pregnant, or are planning to become pregnant. If you drink alcohol: Limit how much you have to: 0-1 drink a day for women. 0-2 drinks a day for men. Know how much alcohol is in your drink. In the [place] one drink equals one 12 oz bottle of beer (355 mL), one 5 oz glass of wine (148 mL), or one 1 oz glass of hard liquor (44 mL). General instructions Drink enough fluids to keep your urine pale yellow. Take over-the-counter and prescription medicines only as told by your health care provider. Ask your health care provider about taking vitamins, herbs, and supplements. Contact a health care provider if: You have vomiting or diarrhea that does not go away. You have a fever. You have weakness or dizziness. You feel faint. Get help right away if: You have pain in your chest, upper arms, jaw, or neck. You have palpitations that do not go away. Summary In sinus tachycardia, the heart beats more than 100 times a minute. Sinus tachycardia may be harmless, or it may be a sign of a serious condition. Treatment for this condition depends on the cause or the underlying condition. Get help right away if you have pain in your chest, upper arms, jaw, or neck. This information is not intended to replace advice given to you by your health care provider. Make sure you discuss any questions you have with your health care provider. Document Revised: 04/16/2022 Document Reviewed: 04/16/2022 Elsevier Patient Education  2024 [employer].

## 2024-09-10 DIAGNOSIS — Z87891 Personal history of nicotine dependence: Secondary | ICD-10-CM | POA: Diagnosis not present

## 2024-09-10 DIAGNOSIS — L97421 Non-pressure chronic ulcer of left heel and midfoot limited to breakdown of skin: Secondary | ICD-10-CM | POA: Diagnosis not present

## 2024-09-10 DIAGNOSIS — I10 Essential (primary) hypertension: Secondary | ICD-10-CM | POA: Diagnosis not present

## 2024-09-10 DIAGNOSIS — E1151 Type 2 diabetes mellitus with diabetic peripheral angiopathy without gangrene: Secondary | ICD-10-CM | POA: Diagnosis not present

## 2024-09-10 DIAGNOSIS — Z96642 Presence of left artificial hip joint: Secondary | ICD-10-CM | POA: Diagnosis not present

## 2024-09-10 DIAGNOSIS — J45909 Unspecified asthma, uncomplicated: Secondary | ICD-10-CM | POA: Diagnosis not present

## 2024-09-10 DIAGNOSIS — D649 Anemia, unspecified: Secondary | ICD-10-CM | POA: Diagnosis not present

## 2024-09-10 DIAGNOSIS — Z794 Long term (current) use of insulin: Secondary | ICD-10-CM | POA: Diagnosis not present

## 2024-09-10 DIAGNOSIS — E11621 Type 2 diabetes mellitus with foot ulcer: Secondary | ICD-10-CM | POA: Diagnosis not present

## 2024-09-10 DIAGNOSIS — Z604 Social exclusion and rejection: Secondary | ICD-10-CM | POA: Diagnosis not present

## 2024-09-10 DIAGNOSIS — Z7982 Long term (current) use of aspirin: Secondary | ICD-10-CM | POA: Diagnosis not present

## 2024-09-10 DIAGNOSIS — Z7984 Long term (current) use of oral hypoglycemic drugs: Secondary | ICD-10-CM | POA: Diagnosis not present

## 2024-09-10 DIAGNOSIS — Z791 Long term (current) use of non-steroidal anti-inflammatories (NSAID): Secondary | ICD-10-CM | POA: Diagnosis not present

## 2024-09-10 DIAGNOSIS — Z556 Problems related to health literacy: Secondary | ICD-10-CM | POA: Diagnosis not present

## 2024-09-10 DIAGNOSIS — L97521 Non-pressure chronic ulcer of other part of left foot limited to breakdown of skin: Secondary | ICD-10-CM | POA: Diagnosis not present

## 2024-09-13 DIAGNOSIS — Z87891 Personal history of nicotine dependence: Secondary | ICD-10-CM | POA: Diagnosis not present

## 2024-09-13 DIAGNOSIS — J45909 Unspecified asthma, uncomplicated: Secondary | ICD-10-CM | POA: Diagnosis not present

## 2024-09-13 DIAGNOSIS — L97421 Non-pressure chronic ulcer of left heel and midfoot limited to breakdown of skin: Secondary | ICD-10-CM | POA: Diagnosis not present

## 2024-09-13 DIAGNOSIS — I10 Essential (primary) hypertension: Secondary | ICD-10-CM | POA: Diagnosis not present

## 2024-09-13 DIAGNOSIS — L97521 Non-pressure chronic ulcer of other part of left foot limited to breakdown of skin: Secondary | ICD-10-CM | POA: Diagnosis not present

## 2024-09-13 DIAGNOSIS — E11621 Type 2 diabetes mellitus with foot ulcer: Secondary | ICD-10-CM | POA: Diagnosis not present

## 2024-09-13 DIAGNOSIS — Z7984 Long term (current) use of oral hypoglycemic drugs: Secondary | ICD-10-CM | POA: Diagnosis not present

## 2024-09-13 DIAGNOSIS — Z96642 Presence of left artificial hip joint: Secondary | ICD-10-CM | POA: Diagnosis not present

## 2024-09-13 DIAGNOSIS — D649 Anemia, unspecified: Secondary | ICD-10-CM | POA: Diagnosis not present

## 2024-09-13 DIAGNOSIS — Z791 Long term (current) use of non-steroidal anti-inflammatories (NSAID): Secondary | ICD-10-CM | POA: Diagnosis not present

## 2024-09-13 DIAGNOSIS — Z604 Social exclusion and rejection: Secondary | ICD-10-CM | POA: Diagnosis not present

## 2024-09-13 DIAGNOSIS — Z556 Problems related to health literacy: Secondary | ICD-10-CM | POA: Diagnosis not present

## 2024-09-13 DIAGNOSIS — Z794 Long term (current) use of insulin: Secondary | ICD-10-CM | POA: Diagnosis not present

## 2024-09-13 DIAGNOSIS — Z7982 Long term (current) use of aspirin: Secondary | ICD-10-CM | POA: Diagnosis not present

## 2024-09-13 DIAGNOSIS — E1151 Type 2 diabetes mellitus with diabetic peripheral angiopathy without gangrene: Secondary | ICD-10-CM | POA: Diagnosis not present

## 2024-09-15 DIAGNOSIS — Z96642 Presence of left artificial hip joint: Secondary | ICD-10-CM | POA: Diagnosis not present

## 2024-09-15 DIAGNOSIS — Z791 Long term (current) use of non-steroidal anti-inflammatories (NSAID): Secondary | ICD-10-CM | POA: Diagnosis not present

## 2024-09-15 DIAGNOSIS — L97421 Non-pressure chronic ulcer of left heel and midfoot limited to breakdown of skin: Secondary | ICD-10-CM | POA: Diagnosis not present

## 2024-09-15 DIAGNOSIS — Z604 Social exclusion and rejection: Secondary | ICD-10-CM | POA: Diagnosis not present

## 2024-09-15 DIAGNOSIS — Z7984 Long term (current) use of oral hypoglycemic drugs: Secondary | ICD-10-CM | POA: Diagnosis not present

## 2024-09-15 DIAGNOSIS — Z7982 Long term (current) use of aspirin: Secondary | ICD-10-CM | POA: Diagnosis not present

## 2024-09-15 DIAGNOSIS — J45909 Unspecified asthma, uncomplicated: Secondary | ICD-10-CM | POA: Diagnosis not present

## 2024-09-15 DIAGNOSIS — L97521 Non-pressure chronic ulcer of other part of left foot limited to breakdown of skin: Secondary | ICD-10-CM | POA: Diagnosis not present

## 2024-09-15 DIAGNOSIS — Z556 Problems related to health literacy: Secondary | ICD-10-CM | POA: Diagnosis not present

## 2024-09-15 DIAGNOSIS — E11621 Type 2 diabetes mellitus with foot ulcer: Secondary | ICD-10-CM | POA: Diagnosis not present

## 2024-09-15 DIAGNOSIS — Z87891 Personal history of nicotine dependence: Secondary | ICD-10-CM | POA: Diagnosis not present

## 2024-09-15 DIAGNOSIS — D649 Anemia, unspecified: Secondary | ICD-10-CM | POA: Diagnosis not present

## 2024-09-15 DIAGNOSIS — Z794 Long term (current) use of insulin: Secondary | ICD-10-CM | POA: Diagnosis not present

## 2024-09-15 DIAGNOSIS — E1151 Type 2 diabetes mellitus with diabetic peripheral angiopathy without gangrene: Secondary | ICD-10-CM | POA: Diagnosis not present

## 2024-09-15 DIAGNOSIS — I10 Essential (primary) hypertension: Secondary | ICD-10-CM | POA: Diagnosis not present

## 2024-09-21 DIAGNOSIS — Z7984 Long term (current) use of oral hypoglycemic drugs: Secondary | ICD-10-CM | POA: Diagnosis not present

## 2024-09-21 DIAGNOSIS — E11621 Type 2 diabetes mellitus with foot ulcer: Secondary | ICD-10-CM | POA: Diagnosis not present

## 2024-09-21 DIAGNOSIS — Z794 Long term (current) use of insulin: Secondary | ICD-10-CM | POA: Diagnosis not present

## 2024-09-21 DIAGNOSIS — Z604 Social exclusion and rejection: Secondary | ICD-10-CM | POA: Diagnosis not present

## 2024-09-21 DIAGNOSIS — Z87891 Personal history of nicotine dependence: Secondary | ICD-10-CM | POA: Diagnosis not present

## 2024-09-21 DIAGNOSIS — D649 Anemia, unspecified: Secondary | ICD-10-CM | POA: Diagnosis not present

## 2024-09-21 DIAGNOSIS — L97421 Non-pressure chronic ulcer of left heel and midfoot limited to breakdown of skin: Secondary | ICD-10-CM | POA: Diagnosis not present

## 2024-09-21 DIAGNOSIS — E1151 Type 2 diabetes mellitus with diabetic peripheral angiopathy without gangrene: Secondary | ICD-10-CM | POA: Diagnosis not present

## 2024-09-21 DIAGNOSIS — I10 Essential (primary) hypertension: Secondary | ICD-10-CM | POA: Diagnosis not present

## 2024-09-21 DIAGNOSIS — L97521 Non-pressure chronic ulcer of other part of left foot limited to breakdown of skin: Secondary | ICD-10-CM | POA: Diagnosis not present

## 2024-09-21 DIAGNOSIS — Z96642 Presence of left artificial hip joint: Secondary | ICD-10-CM | POA: Diagnosis not present

## 2024-09-21 DIAGNOSIS — Z791 Long term (current) use of non-steroidal anti-inflammatories (NSAID): Secondary | ICD-10-CM | POA: Diagnosis not present

## 2024-09-21 DIAGNOSIS — Z556 Problems related to health literacy: Secondary | ICD-10-CM | POA: Diagnosis not present

## 2024-09-21 DIAGNOSIS — Z7982 Long term (current) use of aspirin: Secondary | ICD-10-CM | POA: Diagnosis not present

## 2024-09-21 DIAGNOSIS — J45909 Unspecified asthma, uncomplicated: Secondary | ICD-10-CM | POA: Diagnosis not present

## 2024-09-23 DIAGNOSIS — Z7982 Long term (current) use of aspirin: Secondary | ICD-10-CM | POA: Diagnosis not present

## 2024-09-23 DIAGNOSIS — E1151 Type 2 diabetes mellitus with diabetic peripheral angiopathy without gangrene: Secondary | ICD-10-CM | POA: Diagnosis not present

## 2024-09-23 DIAGNOSIS — Z7984 Long term (current) use of oral hypoglycemic drugs: Secondary | ICD-10-CM | POA: Diagnosis not present

## 2024-09-23 DIAGNOSIS — Z794 Long term (current) use of insulin: Secondary | ICD-10-CM | POA: Diagnosis not present

## 2024-09-23 DIAGNOSIS — E11621 Type 2 diabetes mellitus with foot ulcer: Secondary | ICD-10-CM | POA: Diagnosis not present

## 2024-09-23 DIAGNOSIS — Z87891 Personal history of nicotine dependence: Secondary | ICD-10-CM | POA: Diagnosis not present

## 2024-09-23 DIAGNOSIS — D649 Anemia, unspecified: Secondary | ICD-10-CM | POA: Diagnosis not present

## 2024-09-23 DIAGNOSIS — L97421 Non-pressure chronic ulcer of left heel and midfoot limited to breakdown of skin: Secondary | ICD-10-CM | POA: Diagnosis not present

## 2024-09-23 DIAGNOSIS — J45909 Unspecified asthma, uncomplicated: Secondary | ICD-10-CM | POA: Diagnosis not present

## 2024-09-23 DIAGNOSIS — Z556 Problems related to health literacy: Secondary | ICD-10-CM | POA: Diagnosis not present

## 2024-09-23 DIAGNOSIS — Z604 Social exclusion and rejection: Secondary | ICD-10-CM | POA: Diagnosis not present

## 2024-09-23 DIAGNOSIS — Z791 Long term (current) use of non-steroidal anti-inflammatories (NSAID): Secondary | ICD-10-CM | POA: Diagnosis not present

## 2024-09-23 DIAGNOSIS — Z96642 Presence of left artificial hip joint: Secondary | ICD-10-CM | POA: Diagnosis not present

## 2024-09-23 DIAGNOSIS — L97521 Non-pressure chronic ulcer of other part of left foot limited to breakdown of skin: Secondary | ICD-10-CM | POA: Diagnosis not present

## 2024-09-23 DIAGNOSIS — I10 Essential (primary) hypertension: Secondary | ICD-10-CM | POA: Diagnosis not present

## 2024-09-27 ENCOUNTER — Telehealth: Payer: Self-pay | Admitting: Pharmacist

## 2024-09-27 DIAGNOSIS — I10 Essential (primary) hypertension: Secondary | ICD-10-CM

## 2024-09-27 MED ORDER — LOSARTAN POTASSIUM 100 MG PO TABS: 100.0000 mg | ORAL_TABLET | Freq: Every day | ORAL | 0 refills | Status: DC

## 2024-09-27 NOTE — Telephone Encounter (Signed)
   This patient is appearing on a report for being at risk of failing the adherence measure for hypertension (ACEi/ARB) medications this calendar year.   Medication: losartan  Last fill date: 07/2024 for 90 day supply  Left voicemail for patient to return my call at their convenience., Contacted pharmacy to facilitate refills., and Will collaborate with provider to facilitate refill needs.   Amadea Keagy Dattero Halleigh Comes, PharmD, BCACP, CPP Clinical Pharmacist, [facility]

## 2024-09-28 ENCOUNTER — Ambulatory Visit (INDEPENDENT_AMBULATORY_CARE_PROVIDER_SITE_OTHER): Admitting: Family

## 2024-09-28 ENCOUNTER — Encounter: Payer: Self-pay | Admitting: Family

## 2024-09-28 ENCOUNTER — Ambulatory Visit: Payer: Self-pay | Admitting: Family

## 2024-09-28 VITALS — BP 177/88 | HR 109 | Temp 97.5°F | Ht 70.0 in | Wt 189.0 lb

## 2024-09-28 DIAGNOSIS — E11621 Type 2 diabetes mellitus with foot ulcer: Secondary | ICD-10-CM

## 2024-09-28 DIAGNOSIS — Z7984 Long term (current) use of oral hypoglycemic drugs: Secondary | ICD-10-CM | POA: Diagnosis not present

## 2024-09-28 DIAGNOSIS — F419 Anxiety disorder, unspecified: Secondary | ICD-10-CM | POA: Diagnosis not present

## 2024-09-28 DIAGNOSIS — M170 Bilateral primary osteoarthritis of knee: Secondary | ICD-10-CM | POA: Diagnosis not present

## 2024-09-28 DIAGNOSIS — R002 Palpitations: Secondary | ICD-10-CM | POA: Diagnosis not present

## 2024-09-28 DIAGNOSIS — L97509 Non-pressure chronic ulcer of other part of unspecified foot with unspecified severity: Secondary | ICD-10-CM | POA: Diagnosis not present

## 2024-09-28 DIAGNOSIS — Z79899 Other long term (current) drug therapy: Secondary | ICD-10-CM | POA: Diagnosis not present

## 2024-09-28 DIAGNOSIS — M159 Polyosteoarthritis, unspecified: Secondary | ICD-10-CM

## 2024-09-28 DIAGNOSIS — F1320 Sedative, hypnotic or anxiolytic dependence, uncomplicated: Secondary | ICD-10-CM

## 2024-09-28 DIAGNOSIS — Z7982 Long term (current) use of aspirin: Secondary | ICD-10-CM | POA: Diagnosis not present

## 2024-09-28 DIAGNOSIS — E1159 Type 2 diabetes mellitus with other circulatory complications: Secondary | ICD-10-CM

## 2024-09-28 DIAGNOSIS — Z556 Problems related to health literacy: Secondary | ICD-10-CM | POA: Diagnosis not present

## 2024-09-28 DIAGNOSIS — I10 Essential (primary) hypertension: Secondary | ICD-10-CM | POA: Diagnosis not present

## 2024-09-28 DIAGNOSIS — I152 Hypertension secondary to endocrine disorders: Secondary | ICD-10-CM

## 2024-09-28 DIAGNOSIS — R351 Nocturia: Secondary | ICD-10-CM | POA: Diagnosis not present

## 2024-09-28 DIAGNOSIS — Z791 Long term (current) use of non-steroidal anti-inflammatories (NSAID): Secondary | ICD-10-CM | POA: Diagnosis not present

## 2024-09-28 DIAGNOSIS — J45909 Unspecified asthma, uncomplicated: Secondary | ICD-10-CM | POA: Diagnosis not present

## 2024-09-28 DIAGNOSIS — N401 Enlarged prostate with lower urinary tract symptoms: Secondary | ICD-10-CM | POA: Diagnosis not present

## 2024-09-28 DIAGNOSIS — D649 Anemia, unspecified: Secondary | ICD-10-CM | POA: Diagnosis not present

## 2024-09-28 DIAGNOSIS — Z794 Long term (current) use of insulin: Secondary | ICD-10-CM

## 2024-09-28 DIAGNOSIS — E1151 Type 2 diabetes mellitus with diabetic peripheral angiopathy without gangrene: Secondary | ICD-10-CM | POA: Diagnosis not present

## 2024-09-28 DIAGNOSIS — L97421 Non-pressure chronic ulcer of left heel and midfoot limited to breakdown of skin: Secondary | ICD-10-CM | POA: Diagnosis not present

## 2024-09-28 DIAGNOSIS — Z96642 Presence of left artificial hip joint: Secondary | ICD-10-CM | POA: Diagnosis not present

## 2024-09-28 DIAGNOSIS — Z87891 Personal history of nicotine dependence: Secondary | ICD-10-CM | POA: Diagnosis not present

## 2024-09-28 DIAGNOSIS — L97521 Non-pressure chronic ulcer of other part of left foot limited to breakdown of skin: Secondary | ICD-10-CM | POA: Diagnosis not present

## 2024-09-28 DIAGNOSIS — Z604 Social exclusion and rejection: Secondary | ICD-10-CM | POA: Diagnosis not present

## 2024-09-28 MED ORDER — DIAZEPAM 5 MG PO TABS: 5.0000 mg | ORAL_TABLET | Freq: Two times a day (BID) | ORAL | 2 refills | Status: DC | PRN

## 2024-09-28 MED ORDER — LOSARTAN POTASSIUM 100 MG PO TABS: 100.0000 mg | ORAL_TABLET | Freq: Every day | ORAL | 1 refills | Status: DC

## 2024-09-28 MED ORDER — METHYLPREDNISOLONE ACETATE 80 MG/ML IJ SUSP
80.0000 mg | Freq: Once | INTRAMUSCULAR | Status: AC
  Administered 2024-09-28: 80 mg via INTRAMUSCULAR

## 2024-09-28 MED ORDER — BUPIVACAINE HCL 0.25 % IJ SOLN
1.0000 mL | Freq: Once | INTRAMUSCULAR | Status: AC
  Administered 2024-09-28: 1 mL

## 2024-09-28 MED ORDER — AMLODIPINE BESYLATE 10 MG PO TABS: 10.0000 mg | ORAL_TABLET | Freq: Every day | ORAL | 1 refills | Status: DC

## 2024-09-28 NOTE — Progress Notes (Signed)
 Subjective:    Patient ID: [name], male    DOB: 06-05-1933, 89 y.o.   MRN: [id number]  Chief Complaint  Patient presents with   Wound Check   Pt presents to the office today to follow up on palpitations.   He has a diabetic foot ulcer on left heel. Followed by Podiatry and home health. Reports this is doing much better. Denies any discharge or pain. He has completed doxycycline .  Palpitations  This is a recurrent problem. The current episode started more than 1 month ago. The problem has been waxing and waning. Associated symptoms include anxiety, an irregular heartbeat and shortness of breath. The treatment provided moderate relief. Risk factors include obesity.  Knee Pain  The incident occurred more than 1 week ago. There was no injury mechanism. The pain is present in the right knee and left knee. The pain is at a severity of 10/10. The pain is moderate. The pain has been Intermittent since onset. He reports no foreign bodies present. The symptoms are aggravated by weight bearing. He has tried acetaminophen  for the symptoms. The treatment provided mild relief.  Diabetes He presents for his follow-up diabetic visit. He has type 2 diabetes mellitus. Hypoglycemia symptoms include nervousness/anxiousness. Pertinent negatives for diabetes include no blurred vision. Risk factors for coronary artery disease include diabetes mellitus, dyslipidemia, hypertension, male sex and sedentary lifestyle. He is following a generally healthy diet. His overall blood glucose range is 130-140 mg/dl.  Anxiety Presents for follow-up visit. Symptoms include excessive worry, nervous/anxious behavior, palpitations, restlessness and shortness of breath. Symptoms occur occasionally. The severity of symptoms is moderate.        Review of Systems  Eyes:  Negative for blurred vision.  Respiratory:  Positive for shortness of breath.   Cardiovascular:  Positive for palpitations.  Psychiatric/Behavioral:  The  patient is nervous/anxious.   All other systems reviewed and are negative.   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 Reduced social interaction with others. Reduced interaction with family members   Social Drivers of Health   Financial Resource Strain: High Risk (05/27/2024)   Overall Financial Resource Strain (CARDIA)    Difficulty of Paying [name] Expenses: Hard  Food Insecurity: No Food Insecurity (06/14/2024)   Hunger Vital Sign    Worried About Running Out of Food in the Last Year: Never true    Ran Out of Food in the Last Year: Never true  Recent Concern: Food Insecurity - Food Insecurity Present (05/28/2024)   Hunger Vital Sign    Worried About Running Out of  Food in the Last Year: Often true    Ran Out of Food in the Last Year: Often true  Transportation Needs: No Transportation Needs (06/14/2024)   PRAPARE - Administrator, Civil Service (Medical): No    Lack of Transportation (Non-Medical): No  Physical Activity: Insufficiently Active (05/27/2023)   Exercise Vital Sign    Days of Exercise per Week: 3 days    Minutes of Exercise per Session: 30 min  Stress: No Stress Concern Present (05/27/2024)    [employer] of Occupational Health - Occupational Stress Questionnaire    Feeling of Stress : Not at all  Social Connections: Patient Declined (06/14/2024)   Social Connection and Isolation Panel    Frequency of Communication with Friends and Family: Patient declined    Frequency of Social Gatherings with Friends and Family: Patient declined    Attends Religious Services: Patient declined    Database administrator or Organizations: Patient declined    Attends Banker Meetings: Patient declined    Marital Status: Patient declined  Recent Concern: Social Connections - Socially Isolated (05/28/2024)   Social Connection and Isolation Panel    Frequency of Communication with Friends and Family: More than three times a week    Frequency of Social Gatherings with Friends and Family: More than three times a week    Attends Religious Services: Never    Database administrator or Organizations: No    Attends Banker Meetings: Never    Marital Status: Widowed   Family History  Problem Relation Age of Onset   Diabetes Father    Diabetes Sister    Diabetes Brother    Cancer Brother    Cancer Brother    Heart disease Sister         Objective:   Physical Exam Vitals reviewed.  Constitutional:      General: He is not in acute distress.    Appearance: He is well-developed.  HENT:     Head: Normocephalic.     Right Ear: Tympanic membrane normal.     Left Ear: Tympanic membrane normal.  Eyes:     General:        Right eye: No discharge.        Left eye: No discharge.     Pupils: Pupils are equal, round, and reactive to light.  Neck:     Thyroid : No thyromegaly.  Cardiovascular:     Rate and Rhythm: Normal rate. Rhythm irregular.     Heart sounds: Normal heart sounds. No murmur heard. Pulmonary:     Effort: Pulmonary effort is normal. No respiratory distress.     Breath sounds: Normal breath sounds. No wheezing.  Abdominal:     General: Bowel sounds are  normal. There is no distension.     Palpations: Abdomen is soft.     Tenderness: There is no abdominal tenderness.  Musculoskeletal:        General: Tenderness (pain in lumbar with flexion) present.     Cervical back: Normal range of motion and neck supple.     Comments: Pain in bilateral knee with flexion  Skin:    General: Skin is warm and dry.     Findings: No erythema or rash.  Neurological:     Mental Status: He is alert and oriented to person, place, and time.     Cranial Nerves: No cranial nerve deficit.     Motor: Weakness (using walker, walking at 90 degrees)  present.     Gait: Gait abnormal.     Deep Tendon Reflexes: Reflexes are normal and symmetric.  Psychiatric:        Behavior: Behavior normal.        Thought Content: Thought content normal.        Judgment: Judgment normal.    leftknee prepped with betadine  Injected with Marcaine  .5% plain and methylprednisolone  with 25 guage needle x 1. Patient tolerated well.  rightknee prepped with betadine  Injected with Marcaine  .5% plain and methylprednisolone  with 25 guage needle x 1. Patient tolerated well.    BP (!) 177/88   Pulse (!) 109   Temp (!) 97.5 F (36.4 C) (Temporal)   Ht 5' 10 (1.778 m)   Wt 189 lb (85.7 kg)   BMI 27.12 kg/m      Assessment & Plan:  [name] comes in today with chief complaint of Wound Check   Diagnosis and orders addressed:  1. Benign prostatic hyperplasia with nocturia - CMP14+EGFR  2. Hypertension associated with diabetes (HCC) - amLODipine  (NORVASC ) 10 MG tablet; Take 1 tablet (10 mg total) by mouth daily.  Dispense: 90 tablet; Refill: 1 - CMP14+EGFR  3. Primary hypertension - losartan  (COZAAR ) 100 MG tablet; Take 1 tablet (100 mg total) by mouth daily.  Dispense: 90 tablet; Refill: 1 - CMP14+EGFR  4. Type 2 diabetes mellitus with foot ulcer, with long-term current use of insulin  (HCC) (Primary - CMP14+EGFR  5. Anxiety - diazepam  (VALIUM ) 5 MG tablet; Take 1  tablet (5 mg total) by mouth every 12 (twelve) hours as needed for anxiety.  Dispense: 30 tablet; Refill: 2 - CMP14+EGFR - TSH  6. Controlled substance agreement signed - CMP14+EGFR  7. Benzodiazepine dependence (HCC) - CMP14+EGFR  8. Osteoarthritis of multiple joints, unspecified osteoarthritis type Bilateral knee injection given  - CMP14+EGFR  9. Essential hypertension - CMP14+EGFR  10. Palpitations Limit caffeine and stress Keep Cardiologists follow up Labs pending  - CMP14+EGFR - Anemia Profile B - TSH - EKG 12-Lead   Labs pending Continue current medications  Keep follow up with specialists  Health Maintenance reviewed Diet and exercise encouraged Approx 40 mins spent with patient, education, chart review, and doing procedure   Return in about 3 months (around 12/28/2024), or if symptoms worsen or fail to improve.    Bari Learn, FNP

## 2024-09-28 NOTE — Patient Instructions (Signed)

## 2024-09-28 NOTE — Addendum Note (Signed)
 Addended by: MICHELINE KNEE F on: 09/28/2024 12:13 PM   Modules accepted: Orders

## 2024-09-29 LAB — ANEMIA PROFILE B
Basophils Absolute: 0 x10E3/uL (ref 0.0–0.2)
Basos: 0 %
EOS (ABSOLUTE): 0 x10E3/uL (ref 0.0–0.4)
Eos: 0 %
Ferritin: 129 ng/mL (ref 30–400)
Folate: 9.1 ng/mL (ref >3.0)
Hematocrit: 39.6 % (ref 37.5–51.0)
Hemoglobin: 13.1 g/dL (ref 13.0–17.7)
Immature Grans (Abs): 0 x10E3/uL (ref 0.0–0.1)
Immature Granulocytes: 0 %
Iron Saturation: 28 % (ref 15–55)
Iron: 89 ug/dL (ref 38–169)
Lymphocytes Absolute: 1.8 x10E3/uL (ref 0.7–3.1)
Lymphs: 23 %
MCH: 30.2 pg (ref 26.6–33.0)
MCHC: 33.1 g/dL (ref 31.5–35.7)
MCV: 91 fL (ref 79–97)
Monocytes Absolute: 0.5 x10E3/uL (ref 0.1–0.9)
Monocytes: 7 %
Neutrophils Absolute: 5.5 x10E3/uL (ref 1.4–7.0)
Neutrophils: 70 %
Platelets: 299 x10E3/uL (ref 150–450)
RBC: 4.34 x10E6/uL (ref 4.14–5.80)
RDW: 14.6 % (ref 11.6–15.4)
Retic Ct Pct: 1.2 % (ref 0.6–2.6)
Total Iron Binding Capacity: 322 ug/dL (ref 250–450)
UIBC: 233 ug/dL (ref 111–343)
Vitamin B-12: 263 pg/mL (ref 232–1245)
WBC: 7.9 x10E3/uL (ref 3.4–10.8)

## 2024-09-29 LAB — CMP14+EGFR
ALT: 17 IU/L (ref 0–44)
AST: 24 IU/L (ref 0–40)
Albumin: 5.2 g/dL — AB (ref 3.7–4.7)
Alkaline Phosphatase: 77 IU/L (ref 48–129)
BUN/Creatinine Ratio: 10 (ref 10–24)
BUN: 9 mg/dL (ref 8–27)
Bilirubin Total: 0.3 mg/dL (ref 0.0–1.2)
CO2: 19 mmol/L — AB (ref 20–29)
Calcium: 10.3 mg/dL — AB (ref 8.6–10.2)
Chloride: 97 mmol/L (ref 96–106)
Creatinine, Ser: 0.91 mg/dL (ref 0.76–1.27)
Globulin, Total: 2.5 g/dL (ref 1.5–4.5)
Glucose: 153 mg/dL — AB (ref 70–99)
Potassium: 4 mmol/L (ref 3.5–5.2)
Sodium: 138 mmol/L (ref 134–144)
Total Protein: 7.7 g/dL (ref 6.0–8.5)
eGFR: 81 mL/min/1.73 (ref >59)

## 2024-09-29 LAB — TSH: TSH: 1.78 u[IU]/mL (ref 0.450–4.500)

## 2024-09-30 ENCOUNTER — Other Ambulatory Visit: Payer: Self-pay | Admitting: Family Medicine

## 2024-09-30 DIAGNOSIS — Z7984 Long term (current) use of oral hypoglycemic drugs: Secondary | ICD-10-CM | POA: Diagnosis not present

## 2024-09-30 DIAGNOSIS — I10 Essential (primary) hypertension: Secondary | ICD-10-CM | POA: Diagnosis not present

## 2024-09-30 DIAGNOSIS — Z556 Problems related to health literacy: Secondary | ICD-10-CM | POA: Diagnosis not present

## 2024-09-30 DIAGNOSIS — Z87891 Personal history of nicotine dependence: Secondary | ICD-10-CM | POA: Diagnosis not present

## 2024-09-30 DIAGNOSIS — Z604 Social exclusion and rejection: Secondary | ICD-10-CM | POA: Diagnosis not present

## 2024-09-30 DIAGNOSIS — D649 Anemia, unspecified: Secondary | ICD-10-CM | POA: Diagnosis not present

## 2024-09-30 DIAGNOSIS — Z794 Long term (current) use of insulin: Secondary | ICD-10-CM | POA: Diagnosis not present

## 2024-09-30 DIAGNOSIS — E1151 Type 2 diabetes mellitus with diabetic peripheral angiopathy without gangrene: Secondary | ICD-10-CM | POA: Diagnosis not present

## 2024-09-30 DIAGNOSIS — L97421 Non-pressure chronic ulcer of left heel and midfoot limited to breakdown of skin: Secondary | ICD-10-CM | POA: Diagnosis not present

## 2024-09-30 DIAGNOSIS — N3281 Overactive bladder: Secondary | ICD-10-CM

## 2024-09-30 DIAGNOSIS — Z791 Long term (current) use of non-steroidal anti-inflammatories (NSAID): Secondary | ICD-10-CM | POA: Diagnosis not present

## 2024-09-30 DIAGNOSIS — J45909 Unspecified asthma, uncomplicated: Secondary | ICD-10-CM | POA: Diagnosis not present

## 2024-09-30 DIAGNOSIS — L97521 Non-pressure chronic ulcer of other part of left foot limited to breakdown of skin: Secondary | ICD-10-CM | POA: Diagnosis not present

## 2024-09-30 DIAGNOSIS — Z7982 Long term (current) use of aspirin: Secondary | ICD-10-CM | POA: Diagnosis not present

## 2024-09-30 DIAGNOSIS — Z96642 Presence of left artificial hip joint: Secondary | ICD-10-CM | POA: Diagnosis not present

## 2024-09-30 DIAGNOSIS — E11621 Type 2 diabetes mellitus with foot ulcer: Secondary | ICD-10-CM | POA: Diagnosis not present

## 2024-09-30 MED ORDER — OXYBUTYNIN CHLORIDE ER 5 MG PO TB24: 5.0000 mg | ORAL_TABLET | Freq: Every day | ORAL | 1 refills | Status: AC

## 2024-10-01 ENCOUNTER — Other Ambulatory Visit: Payer: Self-pay | Admitting: Family

## 2024-10-01 DIAGNOSIS — E119 Type 2 diabetes mellitus without complications: Secondary | ICD-10-CM

## 2024-10-03 ENCOUNTER — Other Ambulatory Visit: Payer: Self-pay | Admitting: Family

## 2024-10-03 DIAGNOSIS — G8929 Other chronic pain: Secondary | ICD-10-CM

## 2024-10-07 DIAGNOSIS — L97421 Non-pressure chronic ulcer of left heel and midfoot limited to breakdown of skin: Secondary | ICD-10-CM | POA: Diagnosis not present

## 2024-10-15 ENCOUNTER — Other Ambulatory Visit: Payer: Self-pay | Admitting: Family

## 2024-10-15 DIAGNOSIS — G8929 Other chronic pain: Secondary | ICD-10-CM

## 2024-10-15 DIAGNOSIS — N401 Enlarged prostate with lower urinary tract symptoms: Secondary | ICD-10-CM

## 2024-10-21 DIAGNOSIS — L97521 Non-pressure chronic ulcer of other part of left foot limited to breakdown of skin: Secondary | ICD-10-CM | POA: Diagnosis not present

## 2024-11-09 ENCOUNTER — Other Ambulatory Visit: Payer: Self-pay | Admitting: Family

## 2024-11-09 DIAGNOSIS — G8929 Other chronic pain: Secondary | ICD-10-CM
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
## 2024-11-17 ENCOUNTER — Other Ambulatory Visit: Payer: Self-pay | Admitting: Cardiology

## 2024-11-17 ENCOUNTER — Ambulatory Visit (INDEPENDENT_AMBULATORY_CARE_PROVIDER_SITE_OTHER): Admitting: Cardiology

## 2024-11-17 ENCOUNTER — Encounter: Payer: Self-pay | Admitting: Cardiology

## 2024-11-17 ENCOUNTER — Other Ambulatory Visit (INDEPENDENT_AMBULATORY_CARE_PROVIDER_SITE_OTHER)

## 2024-11-17 VITALS — BP 150/58 | HR 108 | Ht 71.0 in | Wt 189.0 lb

## 2024-11-17 DIAGNOSIS — I10 Essential (primary) hypertension: Secondary | ICD-10-CM

## 2024-11-17 DIAGNOSIS — R000 Tachycardia, unspecified: Secondary | ICD-10-CM

## 2024-11-17 DIAGNOSIS — R55 Syncope and collapse: Secondary | ICD-10-CM | POA: Diagnosis not present

## 2024-11-17 DIAGNOSIS — R002 Palpitations: Secondary | ICD-10-CM

## 2024-11-17 DIAGNOSIS — R0989 Other specified symptoms and signs involving the circulatory and respiratory systems: Secondary | ICD-10-CM | POA: Diagnosis not present

## 2024-11-17 DIAGNOSIS — E785 Hyperlipidemia, unspecified: Secondary | ICD-10-CM | POA: Diagnosis not present

## 2024-11-17 NOTE — Patient Instructions (Addendum)
 Medication Instructions:  Your physician recommends that you continue on your current medications as directed. Please refer to the Current Medication list given to you today.  Labwork: none  Testing/Procedures: Your physician has requested that you have a carotid duplex. This test is an ultrasound of the carotid arteries in your neck. It looks at blood flow through these arteries that supply the brain with blood. Allow one hour for this exam. There are no restrictions or special instructions. 72 hour ZIO XT-see instructions inside of your pamphlet  Follow-Up: Your physician recommends that you schedule a follow-up appointment in: as needed.  Any Other Special Instructions Will Be Listed Below (If Applicable). Your physician has requested that you regularly monitor and record your blood pressure readings at home twice daily for 7 days. Please use the same machine at the same time of day to check your readings and record them. Please bring  your readings to our office for review.  If you need a refill on your cardiac medications before your next appointment, please call your pharmacy.

## 2024-11-29 ENCOUNTER — Other Ambulatory Visit

## 2024-11-29 ENCOUNTER — Ambulatory Visit: Admitting: Physician Assistant

## 2024-11-29 ENCOUNTER — Encounter: Payer: Self-pay | Admitting: Physician Assistant

## 2024-11-29 ENCOUNTER — Other Ambulatory Visit: Payer: Self-pay | Admitting: Family

## 2024-11-29 DIAGNOSIS — Z96642 Presence of left artificial hip joint: Secondary | ICD-10-CM

## 2024-11-29 DIAGNOSIS — N401 Enlarged prostate with lower urinary tract symptoms: Secondary | ICD-10-CM

## 2024-11-29 NOTE — Progress Notes (Signed)
 HPI: [name] returns today status post left total hip arthroplasty 05/28/2024.  He is using a cane.  He does state that he has pain in both reportedly and [facility] FNP gave the patient cortisone injection of both knees and this only helped for about a day.  But did take away all of his pain.  He also reports that radiographs taken of his knees .We have no radiographs on file.  He notes that he was told he has arthritis in both knees.  He does report some low back pain which he has seen Dr. Barbarann for in the past.  Does have lumbar stenosis at L2-3 upon CT scan.  Review of systems: See HPI otherwise negative  Physical exam: General Well-developed well-nourished male in weakness with a cane.  He is able to get on and off the exam table on his own. Bilateral hips: Excellent range of motion of both hips without pain.  Nontender over the trochanteric ridge of both hips. Bilateral knees: Good range of motion.  Patellofemoral crepitus bilaterally.  No tenderness medial lateral joint line.  No effusion abnormal warmth erythema of either knee.  Radiographs: AP pelvis and lateral view left hip show no acute fractures.  Status post left total hip arthroplasty with well-seated components.   Impression: Status post left total hip arthroplasty Bilateral knee pain  Plan: Recommended quad strengthening.  Also recommended the therapy for his knees and back he does not wish to go to therapy states he can exercise at home.  Regards to left hip he is weightbearing as tolerated.  Will see him back at 1 year postop and obtain AP pelvis and lateral view of the left hip.  He may benefit from viscosupplementation injections in the future.  Questions were encouraged and answered at length.

## 2024-12-08 ENCOUNTER — Ambulatory Visit

## 2024-12-08 DIAGNOSIS — R000 Tachycardia, unspecified: Secondary | ICD-10-CM | POA: Diagnosis not present

## 2024-12-08 DIAGNOSIS — R002 Palpitations: Secondary | ICD-10-CM | POA: Diagnosis not present

## 2024-12-10 ENCOUNTER — Ambulatory Visit: Payer: Self-pay | Admitting: Cardiology

## 2024-12-13 ENCOUNTER — Encounter: Payer: Self-pay | Admitting: Physician Assistant

## 2024-12-13 ENCOUNTER — Other Ambulatory Visit

## 2024-12-13 ENCOUNTER — Ambulatory Visit: Admitting: Physician Assistant

## 2024-12-13 DIAGNOSIS — M170 Bilateral primary osteoarthritis of knee: Secondary | ICD-10-CM | POA: Insufficient documentation

## 2024-12-13 DIAGNOSIS — G8929 Other chronic pain: Secondary | ICD-10-CM
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
## 2024-12-21 ENCOUNTER — Ambulatory Visit: Admitting: Family

## 2024-12-21 ENCOUNTER — Encounter: Payer: Self-pay | Admitting: Family

## 2024-12-21 VITALS — BP 102/51 | HR 98 | Temp 98.3°F | Ht 71.0 in | Wt 198.4 lb

## 2024-12-21 DIAGNOSIS — F419 Anxiety disorder, unspecified: Secondary | ICD-10-CM

## 2024-12-21 DIAGNOSIS — Z0001 Encounter for general adult medical examination with abnormal findings: Secondary | ICD-10-CM

## 2024-12-21 DIAGNOSIS — E119 Type 2 diabetes mellitus without complications: Secondary | ICD-10-CM

## 2024-12-21 DIAGNOSIS — G8929 Other chronic pain: Secondary | ICD-10-CM

## 2024-12-21 DIAGNOSIS — N401 Enlarged prostate with lower urinary tract symptoms: Secondary | ICD-10-CM

## 2024-12-21 DIAGNOSIS — E1159 Type 2 diabetes mellitus with other circulatory complications: Secondary | ICD-10-CM

## 2024-12-21 DIAGNOSIS — E559 Vitamin D deficiency, unspecified: Secondary | ICD-10-CM

## 2024-12-21 DIAGNOSIS — E11622 Type 2 diabetes mellitus with other skin ulcer: Secondary | ICD-10-CM

## 2024-12-21 DIAGNOSIS — I152 Hypertension secondary to endocrine disorders: Secondary | ICD-10-CM

## 2024-12-21 DIAGNOSIS — M545 Low back pain, unspecified: Secondary | ICD-10-CM | POA: Diagnosis not present

## 2024-12-21 DIAGNOSIS — I10 Essential (primary) hypertension: Secondary | ICD-10-CM

## 2024-12-21 DIAGNOSIS — M159 Polyosteoarthritis, unspecified: Secondary | ICD-10-CM

## 2024-12-21 DIAGNOSIS — Z23 Encounter for immunization: Secondary | ICD-10-CM

## 2024-12-21 DIAGNOSIS — Z0000 Encounter for general adult medical examination without abnormal findings: Secondary | ICD-10-CM

## 2024-12-21 DIAGNOSIS — K219 Gastro-esophageal reflux disease without esophagitis: Secondary | ICD-10-CM | POA: Diagnosis not present

## 2024-12-21 DIAGNOSIS — E782 Mixed hyperlipidemia: Secondary | ICD-10-CM

## 2024-12-21 DIAGNOSIS — Z794 Long term (current) use of insulin: Secondary | ICD-10-CM | POA: Diagnosis not present

## 2024-12-21 LAB — BAYER DCA HB A1C WAIVED: HB A1C (BAYER DCA - WAIVED): 5.9 % — ABNORMAL HIGH (ref 4.8–5.6)

## 2024-12-21 MED ORDER — VITAMIN D (ERGOCALCIFEROL) 1.25 MG (50000 UNIT) PO CAPS: ORAL_CAPSULE | ORAL | 1 refills | Status: AC

## 2024-12-21 MED ORDER — DIAZEPAM 5 MG PO TABS: 5.0000 mg | ORAL_TABLET | Freq: Every day | ORAL | 2 refills | Status: AC | PRN

## 2024-12-21 MED ORDER — AMLODIPINE BESYLATE 10 MG PO TABS: 10.0000 mg | ORAL_TABLET | Freq: Every day | ORAL | 1 refills | Status: AC

## 2024-12-21 MED ORDER — LOSARTAN POTASSIUM 100 MG PO TABS: 100.0000 mg | ORAL_TABLET | Freq: Every day | ORAL | 1 refills | Status: AC

## 2024-12-21 MED ORDER — DULOXETINE HCL 60 MG PO CPEP: 60.0000 mg | ORAL_CAPSULE | Freq: Every day | ORAL | 1 refills | Status: AC

## 2024-12-21 MED ORDER — METFORMIN HCL ER 500 MG PO TB24: ORAL_TABLET | ORAL | 0 refills | Status: AC

## 2024-12-21 MED ORDER — TAMSULOSIN HCL 0.4 MG PO CAPS: 0.8000 mg | ORAL_CAPSULE | Freq: Every day | ORAL | 0 refills | Status: AC

## 2024-12-21 MED ORDER — FINASTERIDE 5 MG PO TABS: 5.0000 mg | ORAL_TABLET | Freq: Every day | ORAL | 0 refills | Status: AC

## 2024-12-21 NOTE — Patient Instructions (Signed)
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
## 2024-12-22 LAB — CMP14+EGFR
ALT: 14 IU/L (ref 0–44)
AST: 18 IU/L (ref 0–40)
Albumin: 4.8 g/dL — ABNORMAL HIGH (ref 3.7–4.7)
Alkaline Phosphatase: 73 IU/L (ref 48–129)
BUN/Creatinine Ratio: 11 (ref 10–24)
BUN: 11 mg/dL (ref 8–27)
Bilirubin Total: 0.4 mg/dL (ref 0.0–1.2)
CO2: 20 mmol/L (ref 20–29)
Calcium: 10 mg/dL (ref 8.6–10.2)
Chloride: 99 mmol/L (ref 96–106)
Creatinine, Ser: 1.04 mg/dL (ref 0.76–1.27)
Globulin, Total: 2.3 g/dL (ref 1.5–4.5)
Glucose: 130 mg/dL — ABNORMAL HIGH (ref 70–99)
Potassium: 4.4 mmol/L (ref 3.5–5.2)
Sodium: 137 mmol/L (ref 134–144)
Total Protein: 7.1 g/dL (ref 6.0–8.5)
eGFR: 69 mL/min/1.73

## 2024-12-24 ENCOUNTER — Ambulatory Visit: Payer: Self-pay | Admitting: Family

## 2025-01-08 ENCOUNTER — Other Ambulatory Visit: Payer: Self-pay | Admitting: Family

## 2025-01-10 ENCOUNTER — Telehealth: Payer: Self-pay | Admitting: Family

## 2025-01-10 DIAGNOSIS — Z0279 Encounter for issue of other medical certificate: Secondary | ICD-10-CM

## 2025-01-10 NOTE — Telephone Encounter (Signed)
 PT dropped off HANDICAP forms to be completed and signed.  Form Fee Paid? (YES)            If NO, form is placed on front office manager desk to hold until payment received. If YES, then form will be placed in the RX/HH Nurse Coordinators box for completion.  Form will not be processed until payment is received

## 2025-01-10 NOTE — Telephone Encounter (Signed)
 Appt scheduled 03/21/2025

## 2025-01-10 NOTE — Telephone Encounter (Signed)
 [name] in March for 3 mos FU RF sent to pharmacy

## 2025-01-12 NOTE — Telephone Encounter (Signed)
[facility] handicap form ready

## 2025-01-31 ENCOUNTER — Ambulatory Visit: Admitting: Physician Assistant

## 2025-02-21 ENCOUNTER — Ambulatory Visit: Admitting: Physician Assistant

## 2025-03-21 ENCOUNTER — Ambulatory Visit: Admitting: Family

## 2025-05-30 ENCOUNTER — Encounter

## 2025-05-31 ENCOUNTER — Ambulatory Visit: Payer: Self-pay
